# Patient Record
Sex: Female | Born: 1935 | Race: White | Hispanic: No | Smoking: Never smoker
Health system: Southern US, Community
[De-identification: ages and names within clinical notes are randomized; demographics above are authoritative.]

## PROBLEM LIST (undated history)

## (undated) DIAGNOSIS — M359 Systemic involvement of connective tissue, unspecified: Secondary | ICD-10-CM

## (undated) DIAGNOSIS — C4491 Basal cell carcinoma of skin, unspecified: Secondary | ICD-10-CM

## (undated) DIAGNOSIS — L570 Actinic keratosis: Secondary | ICD-10-CM

## (undated) DIAGNOSIS — I10 Essential (primary) hypertension: Secondary | ICD-10-CM

## (undated) DIAGNOSIS — R12 Heartburn: Secondary | ICD-10-CM

## (undated) DIAGNOSIS — M1990 Unspecified osteoarthritis, unspecified site: Secondary | ICD-10-CM

## (undated) HISTORY — PX: BREAST CYST EXCISION: SHX579

## (undated) HISTORY — PX: ABDOMINAL HYSTERECTOMY: SHX81

## (undated) HISTORY — DX: Unspecified osteoarthritis, unspecified site: M19.90

## (undated) HISTORY — DX: Actinic keratosis: L57.0

## (undated) HISTORY — DX: Basal cell carcinoma of skin, unspecified: C44.91

## (undated) HISTORY — DX: Heartburn: R12

---

## 2001-04-05 DIAGNOSIS — M353 Polymyalgia rheumatica: Secondary | ICD-10-CM | POA: Insufficient documentation

## 2002-04-05 DIAGNOSIS — K900 Celiac disease: Secondary | ICD-10-CM | POA: Insufficient documentation

## 2004-03-25 ENCOUNTER — Ambulatory Visit: Payer: Self-pay | Admitting: Rheumatology

## 2004-09-23 ENCOUNTER — Ambulatory Visit: Payer: Self-pay | Admitting: Internal Medicine

## 2004-09-23 ENCOUNTER — Encounter: Payer: Self-pay | Admitting: Internal Medicine

## 2005-01-22 ENCOUNTER — Ambulatory Visit: Payer: Self-pay | Admitting: Internal Medicine

## 2005-03-16 ENCOUNTER — Ambulatory Visit: Payer: Self-pay | Admitting: Internal Medicine

## 2005-04-14 ENCOUNTER — Ambulatory Visit: Payer: Self-pay | Admitting: Pain Medicine

## 2005-08-19 ENCOUNTER — Ambulatory Visit: Payer: Self-pay | Admitting: Gastroenterology

## 2006-01-25 ENCOUNTER — Ambulatory Visit: Payer: Self-pay | Admitting: Internal Medicine

## 2006-03-01 ENCOUNTER — Ambulatory Visit: Payer: Self-pay | Admitting: Internal Medicine

## 2006-03-31 ENCOUNTER — Ambulatory Visit: Payer: Self-pay | Admitting: Internal Medicine

## 2006-05-06 ENCOUNTER — Ambulatory Visit: Payer: Self-pay | Admitting: Internal Medicine

## 2006-07-04 ENCOUNTER — Ambulatory Visit: Payer: Self-pay | Admitting: Internal Medicine

## 2006-07-04 LAB — CONVERTED CEMR LAB
Basophils Absolute: 0.1 10*3/uL (ref 0.0–0.1)
Eosinophils Absolute: 0.2 10*3/uL (ref 0.0–0.6)
Eosinophils Relative: 2.2 % (ref 0.0–5.0)
HCT: 36.5 % (ref 36.0–46.0)
MCV: 83.7 fL (ref 78.0–100.0)
Platelets: 232 10*3/uL (ref 150–400)
RBC: 4.36 M/uL (ref 3.87–5.11)
RDW: 15.6 % — ABNORMAL HIGH (ref 11.5–14.6)
WBC: 8.3 10*3/uL (ref 4.5–10.5)

## 2007-01-12 ENCOUNTER — Encounter: Payer: Self-pay | Admitting: Internal Medicine

## 2007-01-31 ENCOUNTER — Encounter (INDEPENDENT_AMBULATORY_CARE_PROVIDER_SITE_OTHER): Payer: Self-pay | Admitting: *Deleted

## 2007-02-01 ENCOUNTER — Ambulatory Visit: Payer: Self-pay | Admitting: Internal Medicine

## 2007-02-01 DIAGNOSIS — Z860100 Personal history of colon polyps, unspecified: Secondary | ICD-10-CM

## 2007-02-01 DIAGNOSIS — K219 Gastro-esophageal reflux disease without esophagitis: Secondary | ICD-10-CM | POA: Insufficient documentation

## 2007-02-01 DIAGNOSIS — M069 Rheumatoid arthritis, unspecified: Secondary | ICD-10-CM

## 2007-02-01 DIAGNOSIS — E039 Hypothyroidism, unspecified: Secondary | ICD-10-CM | POA: Insufficient documentation

## 2007-02-01 DIAGNOSIS — M899 Disorder of bone, unspecified: Secondary | ICD-10-CM

## 2007-02-01 DIAGNOSIS — M949 Disorder of cartilage, unspecified: Secondary | ICD-10-CM

## 2007-02-01 HISTORY — DX: Disorder of bone, unspecified: M89.9

## 2007-02-01 HISTORY — DX: Personal history of colon polyps, unspecified: Z86.0100

## 2007-02-01 HISTORY — DX: Gastro-esophageal reflux disease without esophagitis: K21.9

## 2007-02-08 ENCOUNTER — Ambulatory Visit: Payer: Self-pay | Admitting: Internal Medicine

## 2007-02-08 ENCOUNTER — Encounter: Payer: Self-pay | Admitting: Internal Medicine

## 2007-02-09 ENCOUNTER — Telehealth (INDEPENDENT_AMBULATORY_CARE_PROVIDER_SITE_OTHER): Payer: Self-pay | Admitting: *Deleted

## 2007-02-10 ENCOUNTER — Telehealth (INDEPENDENT_AMBULATORY_CARE_PROVIDER_SITE_OTHER): Payer: Self-pay | Admitting: *Deleted

## 2007-02-14 ENCOUNTER — Encounter (INDEPENDENT_AMBULATORY_CARE_PROVIDER_SITE_OTHER): Payer: Self-pay | Admitting: *Deleted

## 2007-02-15 ENCOUNTER — Encounter: Payer: Self-pay | Admitting: Internal Medicine

## 2007-03-06 ENCOUNTER — Encounter: Payer: Self-pay | Admitting: Internal Medicine

## 2007-07-25 ENCOUNTER — Ambulatory Visit: Payer: Self-pay | Admitting: Internal Medicine

## 2007-08-11 ENCOUNTER — Telehealth (INDEPENDENT_AMBULATORY_CARE_PROVIDER_SITE_OTHER): Payer: Self-pay | Admitting: *Deleted

## 2007-09-11 ENCOUNTER — Encounter: Payer: Self-pay | Admitting: Internal Medicine

## 2008-01-31 ENCOUNTER — Ambulatory Visit: Payer: Self-pay | Admitting: Rheumatology

## 2008-02-09 ENCOUNTER — Ambulatory Visit: Payer: Self-pay | Admitting: Internal Medicine

## 2009-05-19 ENCOUNTER — Ambulatory Visit: Payer: Self-pay | Admitting: Internal Medicine

## 2009-06-03 ENCOUNTER — Ambulatory Visit: Payer: Self-pay | Admitting: Internal Medicine

## 2009-07-30 ENCOUNTER — Ambulatory Visit: Payer: Self-pay | Admitting: Gastroenterology

## 2010-04-16 ENCOUNTER — Ambulatory Visit: Payer: Self-pay | Admitting: Internal Medicine

## 2010-10-16 ENCOUNTER — Ambulatory Visit: Payer: Self-pay

## 2012-01-13 ENCOUNTER — Other Ambulatory Visit: Payer: Self-pay | Admitting: Internal Medicine

## 2012-01-19 ENCOUNTER — Ambulatory Visit: Payer: Self-pay | Admitting: Internal Medicine

## 2012-03-15 ENCOUNTER — Ambulatory Visit: Payer: Self-pay | Admitting: Ophthalmology

## 2012-03-24 ENCOUNTER — Ambulatory Visit: Payer: Self-pay | Admitting: Ophthalmology

## 2012-04-04 ENCOUNTER — Ambulatory Visit: Payer: Self-pay | Admitting: Ophthalmology

## 2012-04-19 ENCOUNTER — Emergency Department: Payer: Self-pay | Admitting: Emergency Medicine

## 2013-07-19 DIAGNOSIS — M8580 Other specified disorders of bone density and structure, unspecified site: Secondary | ICD-10-CM | POA: Insufficient documentation

## 2014-01-21 DIAGNOSIS — I10 Essential (primary) hypertension: Secondary | ICD-10-CM | POA: Insufficient documentation

## 2014-04-18 ENCOUNTER — Encounter: Payer: Self-pay | Admitting: Podiatry

## 2014-04-18 ENCOUNTER — Ambulatory Visit (INDEPENDENT_AMBULATORY_CARE_PROVIDER_SITE_OTHER): Payer: Medicare Other | Admitting: Podiatry

## 2014-04-18 VITALS — BP 100/66 | HR 68 | Resp 16 | Ht 67.0 in | Wt 145.0 lb

## 2014-04-18 DIAGNOSIS — L84 Corns and callosities: Secondary | ICD-10-CM

## 2014-04-18 DIAGNOSIS — M79676 Pain in unspecified toe(s): Secondary | ICD-10-CM

## 2014-04-18 DIAGNOSIS — B351 Tinea unguium: Secondary | ICD-10-CM

## 2014-04-18 NOTE — Progress Notes (Signed)
   Subjective:    Patient ID: Gloria [name], female    DOB: February 21, 1926, 81 y.o.   MRN: [id number]  HPI Comments: 80 year old female presents the office today with complaints of painful, thick, elongated toenails. She's at the nails are painful particularly with shoe gear and pressure. She states that she is unable to trim the nails herself. She denies any recent redness or drainage from the nail sites. She also states that she has painful calluses on both of her feet which her symptomatically with ambulation. Denies any recent redness or drainage from the sites. No other complaints at this time.  Foot Pain      Review of Systems  Musculoskeletal:       Joint pain Muscle pain   All other systems reviewed and are negative.      Objective:   Physical Exam  AAO x3, NAD DP/PT pulses palpable bilaterally, CRT less than 3 seconds Protective sensation intact with Simms Weinstein monofilament, vibratory sensation intact, Achilles tendon reflex intact Nails hypertrophic, dystrophic, elongated, brittle, discolored 10. No surrounding erythema or drainage from around the nail sites. There is subjective tenderness directly overlying all the nails. Hyperkeratotic lesions bilateral lateral fifth metatarsal head, first MTPJ, right submetatarsal 3. Upon debridement of the lesion no underlying ulceration and no clinical signs of infection present. No other open lesions or pre-ulcer lesions identified. No overlying edema, erythema, increased warmth to bilateral lower extremes. There is no areas of pinpoint bony tenderness bilaterally. Prominent metatarsal heads plantarly with atrophy of the fat pad. MMT 5/5, ROM WNL No calf Compression, swelling, warmth, erythema.         Assessment & Plan:  81 year old female symptomatically onychomycosis/hyperkeratotic lesions. -Treatment options were discussed the patient including alternatives, risks, complications. -Nail sharply debrided 10 without  complication/bleeding. -Hyperkeratotic lesion sharply debrided 4 without complications/bleeding. -Discussed the importance of daily foot inspection. -Dispensed metatarsal pads. -Follow-up in 3 months or sooner should any problems arise. In the meantime, encouraged to call the office with any questions, concerns, change in symptoms.

## 2014-07-16 ENCOUNTER — Ambulatory Visit: Payer: Medicare Other | Admitting: Podiatry

## 2014-07-23 NOTE — Op Note (Signed)
PATIENT NAME:  GENEVE, KIMPEL MR#:  [id number] DATE OF BIRTH:  [birth year]  DATE OF PROCEDURE:  03/24/2012  PREOPERATIVE DIAGNOSIS: Visually significant cataract of the left eye.   POSTOPERATIVE DIAGNOSIS: Visually significant cataract of the left eye.   OPERATIVE PROCEDURE: Cataract extraction by phacoemulsification with implant of intraocular lens to left eye.   SURGEON: Birder Robson, MD.   ANESTHESIA:  1. Managed anesthesia care.  2. Topical tetracaine drops followed by 2% Xylocaine jelly applied in the preoperative holding area.   COMPLICATIONS: None.   TECHNIQUE:  Stop and chop.   DESCRIPTION OF PROCEDURE: The patient was examined and consented in the preoperative holding area where the aforementioned topical anesthesia was applied to the left eye and then brought back to the Operating Room where the left eye was prepped and draped in the usual sterile ophthalmic fashion and a lid speculum was placed. A paracentesis was created with the side port blade and the anterior chamber was filled with viscoelastic. A near clear corneal incision was performed with the steel keratome. A continuous curvilinear capsulorrhexis was performed with a cystotome followed by the capsulorrhexis forceps. Hydrodissection and hydrodelineation were carried out with BSS on a blunt cannula. The lens was removed in a stop and chop technique and the remaining cortical material was removed with the irrigation-aspiration handpiece. The capsular bag was inflated with viscoelastic and the Tecnis ZCB00 23.5 -diopter lens, serial number [id number] was placed in the capsular bag without complication. The remaining viscoelastic was removed from the eye with the irrigation-aspiration handpiece. The wounds were hydrated. The anterior chamber was flushed with Miostat and the eye was inflated to physiologic pressure. The wounds were found to be water tight. The eye was dressed with Vigamox. The patient was given protective  glasses to wear throughout the day and a shield with which to sleep tonight. The patient was also given drops with which to begin a drop regimen today and will follow-up with me in one day.  ____________________________ Livingston Diones. Tineshia Becraft, MD wlp:sb D: 03/24/2012 10:44:49 ET T: 03/24/2012 15:46:53 ET JOB#: [id number]  cc: Jonnelle Lawniczak L. Amrit Cress, MD, <Dictator> Livingston Diones Leeta Grimme MD ELECTRONICALLY SIGNED 04/04/2012 13:42

## 2014-07-26 NOTE — Op Note (Signed)
PATIENT NAME:  Gloria [name], Gloria [name] MR#:  [id number] DATE OF BIRTH:  1925-08-18  DATE OF PROCEDURE:  04/04/2012  PREOPERATIVE DIAGNOSIS: Visually significant cataract of the right eye.   POSTOPERATIVE DIAGNOSIS: Visually significant cataract of the right eye.   OPERATIVE PROCEDURE: Cataract extraction by phacoemulsification with implant of intraocular lens to right eye.   SURGEON: Birder Robson, MD.   ANESTHESIA:  1. Managed anesthesia care.  2. Topical tetracaine drops followed by 2% Xylocaine jelly applied in the preoperative holding area.   COMPLICATIONS: None.   TECHNIQUE:  Stop and chop.  DESCRIPTION OF PROCEDURE: The patient was examined and consented in the preoperative holding area where the aforementioned topical anesthesia was applied to the right eye and then brought back to the Operating Room where the right eye was prepped and draped in the usual sterile ophthalmic fashion and a lid speculum was placed. A paracentesis was created with the side port blade and the anterior chamber was filled with viscoelastic. A near clear corneal incision was performed with the steel keratome. A continuous curvilinear capsulorrhexis was performed with a cystotome followed by the capsulorrhexis forceps. Hydrodissection and hydrodelineation were carried out with BSS on a blunt cannula. The lens was removed in a stop and chop technique and the remaining cortical material was removed with the irrigation-aspiration handpiece. The capsular bag was inflated with viscoelastic and the Technis ZCB00, 23.0-diopter lens, serial number [id number] was placed in the capsular bag without complication. The remaining viscoelastic was removed from the eye with the irrigation-aspiration handpiece. The wounds were hydrated. The anterior chamber was flushed with Miostat and the eye was inflated to physiologic pressure.  0.1 mL of cefuroxime concentration 10 mg/mL was placed in the anterior chamber. The wounds were found to  be water tight. The eye was dressed with Vigamox. The patient was given protective glasses to wear throughout the day and a shield with which to sleep tonight. The patient was also given drops with which to begin a drop regimen today and will follow-up with me in one day.    ____________________________ Livingston Diones. Lael Pilch, MD wlp:ct D: 04/04/2012 13:31:25 ET T: 04/04/2012 13:52:30 ET JOB#: [id number]  cc: Holt Woolbright L. [name], MD, <Dictator> Livingston Diones Iram Astorino MD ELECTRONICALLY SIGNED 04/11/2012 12:10

## 2015-02-07 ENCOUNTER — Other Ambulatory Visit: Payer: Self-pay | Admitting: Internal Medicine

## 2015-02-07 DIAGNOSIS — R911 Solitary pulmonary nodule: Secondary | ICD-10-CM

## 2015-02-13 ENCOUNTER — Ambulatory Visit
Admission: RE | Admit: 2015-02-13 | Discharge: 2015-02-13 | Disposition: A | Discharge status: Home / Self Care | Payer: Medicare Other | Source: Ambulatory Visit | Attending: Internal Medicine | Admitting: Internal Medicine

## 2015-02-13 ENCOUNTER — Ambulatory Visit: Payer: Self-pay

## 2015-02-13 DIAGNOSIS — R911 Solitary pulmonary nodule: Secondary | ICD-10-CM | POA: Insufficient documentation

## 2015-02-14 DIAGNOSIS — I7121 Aneurysm of the ascending aorta, without rupture: Secondary | ICD-10-CM | POA: Insufficient documentation

## 2015-02-14 DIAGNOSIS — I712 Thoracic aortic aneurysm, without rupture: Secondary | ICD-10-CM | POA: Insufficient documentation

## 2015-04-21 ENCOUNTER — Other Ambulatory Visit: Payer: Self-pay | Admitting: Internal Medicine

## 2015-04-21 DIAGNOSIS — I7121 Aneurysm of the ascending aorta, without rupture: Secondary | ICD-10-CM

## 2015-04-21 DIAGNOSIS — I712 Thoracic aortic aneurysm, without rupture: Secondary | ICD-10-CM

## 2015-04-25 ENCOUNTER — Ambulatory Visit
Admission: RE | Admit: 2015-04-25 | Discharge: 2015-04-25 | Disposition: A | Discharge status: Home / Self Care | Payer: Medicare Other | Source: Ambulatory Visit | Attending: Internal Medicine | Admitting: Internal Medicine

## 2015-04-25 DIAGNOSIS — R1084 Generalized abdominal pain: Secondary | ICD-10-CM | POA: Diagnosis present

## 2015-04-25 DIAGNOSIS — I712 Thoracic aortic aneurysm, without rupture: Secondary | ICD-10-CM

## 2015-04-25 DIAGNOSIS — I7121 Aneurysm of the ascending aorta, without rupture: Secondary | ICD-10-CM

## 2015-05-01 ENCOUNTER — Other Ambulatory Visit: Payer: Self-pay | Admitting: Internal Medicine

## 2015-05-01 DIAGNOSIS — R1084 Generalized abdominal pain: Secondary | ICD-10-CM

## 2015-05-08 ENCOUNTER — Ambulatory Visit: Payer: Medicare Other

## 2015-05-09 ENCOUNTER — Ambulatory Visit
Admission: RE | Admit: 2015-05-09 | Discharge: 2015-05-09 | Disposition: A | Discharge status: Home / Self Care | Payer: Medicare Other | Source: Ambulatory Visit | Attending: Internal Medicine | Admitting: Internal Medicine

## 2015-05-09 DIAGNOSIS — Z90710 Acquired absence of both cervix and uterus: Secondary | ICD-10-CM | POA: Diagnosis not present

## 2015-05-09 DIAGNOSIS — K429 Umbilical hernia without obstruction or gangrene: Secondary | ICD-10-CM | POA: Insufficient documentation

## 2015-05-09 DIAGNOSIS — R102 Pelvic and perineal pain: Secondary | ICD-10-CM | POA: Diagnosis not present

## 2015-05-09 DIAGNOSIS — K760 Fatty (change of) liver, not elsewhere classified: Secondary | ICD-10-CM | POA: Diagnosis not present

## 2015-05-09 DIAGNOSIS — K5730 Diverticulosis of large intestine without perforation or abscess without bleeding: Secondary | ICD-10-CM | POA: Insufficient documentation

## 2015-05-09 DIAGNOSIS — R1084 Generalized abdominal pain: Secondary | ICD-10-CM | POA: Diagnosis not present

## 2015-05-09 HISTORY — DX: Essential (primary) hypertension: I10

## 2015-05-09 HISTORY — DX: Systemic involvement of connective tissue, unspecified: M35.9

## 2015-05-09 MED ORDER — IOHEXOL 300 MG/ML  SOLN
100.0000 mL | Freq: Once | INTRAMUSCULAR | Status: AC | PRN
  Administered 2015-05-09: 100 mL via INTRAVENOUS

## 2015-07-08 ENCOUNTER — Ambulatory Visit: Payer: Medicare Other | Admitting: Podiatry

## 2015-07-24 ENCOUNTER — Ambulatory Visit (INDEPENDENT_AMBULATORY_CARE_PROVIDER_SITE_OTHER): Payer: Medicare Other | Admitting: Podiatry

## 2015-07-24 ENCOUNTER — Encounter: Payer: Self-pay | Admitting: Podiatry

## 2015-07-24 DIAGNOSIS — B351 Tinea unguium: Secondary | ICD-10-CM

## 2015-07-24 DIAGNOSIS — L84 Corns and callosities: Secondary | ICD-10-CM

## 2015-07-24 DIAGNOSIS — M79676 Pain in unspecified toe(s): Secondary | ICD-10-CM

## 2015-07-24 NOTE — Progress Notes (Signed)
Patient ID: Gloria [name], female   DOB: [birth year], 82 y.o.   MRN: [id number]  Subjective: 82 y.o. returns the office today for painful, elongated, thickened toenails which she cannot trim herself. Denies any redness or drainage around the nails. Also has painful calluses to both of her feet. Denies any acute changes since last appointment and no new complaints today. Denies any systemic complaints such as fevers, chills, nausea, vomiting.   Objective: AAO 3, NAD DP/PT pulses palpable, CRT less than 3 seconds Nails hypertrophic, dystrophic, elongated, brittle, discolored 10. There is tenderness overlying the nails 1-5 bilaterally. There is no surrounding erythema or drainage along the nail sites. Hyperkeratotic lesions present medial hallux bilaterally, sub-metatarsal 5 bilaterally, sub-metatarsal 3 right, sub-metatarsal 4 left. Upon debridement no underlying ulceration, drainage or other signs of infection No open lesions or pre-ulcerative lesions are identified. No other areas of tenderness bilateral lower extremities. No overlying edema, erythema, increased warmth. No pain with calf compression, swelling, warmth, erythema.  Assessment: Patient presents with symptomatic onychomycosis; porokertosis   Plan: -Treatment options including alternatives, risks, complications were discussed -Nails sharply debrided 10 without complication/bleeding. -Hyperkeratotic lesions debrided 6 complications or bleeding. -Discussed daily foot inspection. If there are any changes, to call the office immediately.  -Follow-up in 3 months or sooner if any problems are to arise. In the meantime, encouraged to call the office with any questions, concerns, changes symptoms.  Celesta Gentile, DPM

## 2015-11-03 ENCOUNTER — Encounter: Payer: Self-pay | Admitting: Urology

## 2015-11-03 ENCOUNTER — Ambulatory Visit (INDEPENDENT_AMBULATORY_CARE_PROVIDER_SITE_OTHER): Payer: Medicare Other | Admitting: Urology

## 2015-11-03 VITALS — BP 136/73 | HR 66 | Ht 67.0 in | Wt 149.8 lb

## 2015-11-03 DIAGNOSIS — N390 Urinary tract infection, site not specified: Secondary | ICD-10-CM

## 2015-11-03 DIAGNOSIS — R109 Unspecified abdominal pain: Secondary | ICD-10-CM

## 2015-11-03 LAB — BLADDER SCAN AMB NON-IMAGING: Scan Result: 14

## 2015-11-03 NOTE — Progress Notes (Signed)
11/03/2015 3:56 PM   Gloria [name] 1925-12-22 [id number]  Referring provider: Adin Hector, MD [place], [place] [address]  Chief Complaint  Patient presents with  . Abdominal Pain    referred by Dr. Ramonita [name]    HPI: Patient is an 82 year old Caucasian female who has been having lower abdominal  pain since January 2017.    She has seen her PCP for this pain and had an x-ray which did not demonstrate an etiology.  She then tried a gluten free diet and this did not provide relief.  She was evaluated by GI and a CT noted constipation.  She was started on Mira lax and this did not provide relief.    She states her pain is located in the lower abdomen.  She describes it as a "headache" in her lower abdomen.  It is worse in the evening.  She has tried heating pads and different positions and they have not provided relief.    Her urinary complaints are frequency and nocturia x 4.  She denies sleep apnea.    She did have a vaginal sling placed twenty years ago.  Her PVR was 14 mL.  She is not having gross hematuria or dysuria.  She denies fevers, chills, nausea or vomiting.    She does experience symptoms of an UTI after receiving her Remicade injection for her RA.  These are managed through rheumatology.  She is not experiencing any symptoms at this time.      PMH: Past Medical History:  Diagnosis Date  . Arthritis   . Collagen vascular disease (HCC)    RA  . Heartburn   . Hypertension     Surgical History: Past Surgical History:  Procedure Laterality Date  . ABDOMINAL HYSTERECTOMY    . BREAST CYST EXCISION      Home Medications:    Medication List       Accurate as of 11/03/15  3:56 PM. Always use your most recent med list.          amLODipine 5 MG tablet Commonly known as:  NORVASC TAKE 1 TABLET BY MOUTH EVERY DAY   calcium carbonate 500 MG chewable tablet Commonly known as:  TUMS - dosed in mg elemental  calcium Chew by mouth.   ciprofloxacin 500 MG tablet Commonly known as:  CIPRO Take 500 mg by mouth 2 (two) times daily.   folic acid 1 MG tablet Commonly known as:  FOLVITE TAKE 1 TABLET EVERY DAY   levothyroxine 75 MCG tablet Commonly known as:  SYNTHROID, LEVOTHROID   methotrexate 2.5 MG tablet Commonly known as:  RHEUMATREX TAKE 4 TABLETS WEEKLY WITH A MEAL   multivitamin capsule Take by mouth.   predniSONE 10 MG tablet Commonly known as:  DELTASONE Take 10 mg by mouth daily. as directed   REMICADE IV Inject into the vein. Every two months   TOVIAZ 8 MG Tb24 tablet Generic drug:  fesoterodine Take 8 mg by mouth daily.   UNABLE TO FIND Med Name: Remacade injection every two months       Allergies:  Allergies  Allergen Reactions  . Codeine Nausea Only  . Ibandronate Sodium   . Ibandronate Sodium  [Ibandronic Acid] Other (See Comments)    Gi upset  . Risedronate Other (See Comments)    Gi upset  . Risedronate Sodium   . Sulfa Antibiotics Hives    Family History: Family History  Problem Relation Age of Onset  . Kidney disease Neg Hx   . Bladder Cancer Neg Hx     Social History:  reports that she has never smoked. She does not have any smokeless tobacco history on file. She reports that she does not drink alcohol. Her drug history is not on file.  ROS: UROLOGY Frequent Urination?: Yes Hard to postpone urination?: No Burning/pain with urination?: No Get up at night to urinate?: Yes Leakage of urine?: No Urine stream starts and stops?: No Trouble starting stream?: No Do you have to strain to urinate?: No Blood in urine?: No Urinary tract infection?: Yes Sexually transmitted disease?: No Injury to kidneys or bladder?: No Painful intercourse?: No Weak stream?: No Currently pregnant?: No Vaginal bleeding?: No Last menstrual period?: n  Gastrointestinal Nausea?: No Vomiting?: No Indigestion/heartburn?: Yes Diarrhea?: No Constipation?:  No  Constitutional Fever: No Night sweats?: No Weight loss?: No Fatigue?: No  Skin Skin rash/lesions?: No Itching?: No  Eyes Blurred vision?: No Double vision?: No  Ears/Nose/Throat Sore throat?: No Sinus problems?: No  Hematologic/Lymphatic Swollen glands?: No Easy bruising?: Yes  Cardiovascular Leg swelling?: No Chest pain?: No  Respiratory Cough?: No Shortness of breath?: No  Endocrine Excessive thirst?: No  Musculoskeletal Back pain?: No Joint pain?: Yes  Neurological Headaches?: No Dizziness?: No  Psychologic Depression?: No Anxiety?: No  Physical Exam: BP 136/73   Pulse 66   Ht 5\' 7"  (1.702 m)   Wt 149 lb 12.8 oz (67.9 kg)   BMI 23.46 kg/m   Constitutional: Well nourished. Alert and oriented, No acute distress. HEENT: Gloria [name] AT, moist mucus membranes. Trachea midline, no masses. Cardiovascular: No clubbing, cyanosis, or edema. Respiratory: Normal respiratory effort, no increased work of breathing. GI: Abdomen is soft, non tender, non distended, no abdominal masses. Liver and spleen not palpable.  No hernias appreciated.  Stool sample for occult testing is not indicated.   GU: No CVA tenderness.  No bladder fullness or masses.   Skin: No rashes, bruises or suspicious lesions. Lymph: No cervical or inguinal adenopathy. Neurologic: Grossly intact, no focal deficits, moving all 4 extremities. Psychiatric: Normal mood and affect.  Laboratory Data: [name] Results  Component Value Date   WBC 8.3 07/04/2006   HGB 12.4 07/04/2006   HCT 36.5 07/04/2006   MCV 83.7 07/04/2006   PLT 232 07/04/2006     Pertinent Imaging: Results for Gloria [name], Gloria [name] (MRN [id number]) as of 11/03/2015 15:41  Ref. Range 11/03/2015 15:39  Scan Result Unknown 14    Assessment & Plan:    1. Lower abdomen pain:   Could not identify an urological etiology for her abdominal pain.  She will follow up with GI.    2. Recurrent UTI's:   Patient would like to contact us for  her UTI symptoms and management.  I stated that would be fine.    I spent 45 minutes in a face-to-face conversation with the patient discussing her symptoms, reviewing her previous records and exam.  Greater than 50% was spent in counseling & coordination of care with the patient.  Return for patient to call if she has symptoms of an UTI.  These notes generated with voice recognition software. I apologize for typographical errors.  Zara Council, [facility] [address], [place] [employer], [place] [address] [phone]

## 2016-01-08 ENCOUNTER — Other Ambulatory Visit: Payer: Self-pay | Admitting: Internal Medicine

## 2016-01-09 ENCOUNTER — Other Ambulatory Visit
Admission: RE | Admit: 2016-01-09 | Discharge: 2016-01-09 | Disposition: A | Discharge status: Home / Self Care | Payer: Medicare Other | Source: Ambulatory Visit | Attending: Gastroenterology | Admitting: Gastroenterology

## 2016-01-09 ENCOUNTER — Other Ambulatory Visit: Payer: Self-pay | Admitting: Internal Medicine

## 2016-01-09 DIAGNOSIS — R509 Fever, unspecified: Secondary | ICD-10-CM

## 2016-01-09 DIAGNOSIS — R1031 Right lower quadrant pain: Secondary | ICD-10-CM

## 2016-01-09 DIAGNOSIS — R197 Diarrhea, unspecified: Secondary | ICD-10-CM | POA: Insufficient documentation

## 2016-01-09 DIAGNOSIS — K922 Gastrointestinal hemorrhage, unspecified: Secondary | ICD-10-CM

## 2016-01-20 LAB — GASTROINTESTINAL PANEL BY PCR, STOOL (REPLACES STOOL CULTURE)
ADENOVIRUS F40/41: NOT DETECTED
ASTROVIRUS: NOT DETECTED
CYCLOSPORA CAYETANENSIS: NOT DETECTED
Campylobacter species: NOT DETECTED
Cryptosporidium: NOT DETECTED
E. coli O157: NOT DETECTED
ENTAMOEBA HISTOLYTICA: NOT DETECTED
ENTEROPATHOGENIC E COLI (EPEC): NOT DETECTED
ENTEROTOXIGENIC E COLI (ETEC): NOT DETECTED
Enteroaggregative E coli (EAEC): NOT DETECTED
Giardia lamblia: NOT DETECTED
Norovirus GI/GII: NOT DETECTED
Plesimonas shigelloides: NOT DETECTED
ROTAVIRUS A: NOT DETECTED
SHIGA LIKE TOXIN PRODUCING E COLI (STEC): DETECTED — AB
Salmonella species: NOT DETECTED
Sapovirus (I, II, IV, and V): NOT DETECTED
Shigella/Enteroinvasive E coli (EIEC): NOT DETECTED
VIBRIO CHOLERAE: NOT DETECTED
VIBRIO SPECIES: NOT DETECTED
Yersinia enterocolitica: NOT DETECTED

## 2016-03-08 LAB — MISCELLANEOUS TEST

## 2016-06-23 ENCOUNTER — Encounter: Payer: Self-pay | Admitting: Urology

## 2016-06-23 ENCOUNTER — Ambulatory Visit: Payer: Medicare Other | Admitting: Urology

## 2016-06-23 VITALS — BP 98/67 | HR 68 | Ht 67.0 in | Wt 150.1 lb

## 2016-06-23 DIAGNOSIS — N390 Urinary tract infection, site not specified: Secondary | ICD-10-CM

## 2016-06-23 DIAGNOSIS — R350 Frequency of micturition: Secondary | ICD-10-CM

## 2016-06-23 NOTE — Progress Notes (Signed)
06/23/2016 1:02 PM   Gloria [name] 24-Mar-1926 [id number]  Referring provider: Adin Hector, MD [name], [name] [address]  Chief Complaint  Patient presents with  . Cystitis    HPI: 83 year old female who presents to the office today with concerns about recurrent urinary tract infections. She was last seen and evaluated by Gloria [name] in 10/2015 for nonspecific lower abdominal pain.  She reports that since November, she is been treated for at least 4 urinary tract infections. Review of urine culture data show one single positive UA/urine culture drug growing enterococcus on 02/17/2016 by her PCP. On the other occasions, she reports that she called complaining of her symptoms and was given antibiotics without any testing.  She does have a personal history of rheumatoid arthritis and was previously on Remicade infusions which were stopped in January of this year. She reports that about 2 weeks after each treatment which was given every other month, she had severe urinary tract symptoms and UTI.  She also has some concerns today bowel episodes of vaginal itching. She self treated a few weeks ago with a Monistat and had recurrent vaginal irritation/itching.   She does have urgency, urge incontinence and wears a pad daily.  She has tried [place] but cannot recall weather or not this has helped.  Normal kidney's bilaterally.  Today, she is asymptomatic other than her baseline urinary symptoms. She deneis gross hematuria or dysuria.  She denies fevers, chills, nausea or vomiting.     PMH: Past Medical History:  Diagnosis Date  . Arthritis   . Collagen vascular disease (HCC)    RA  . Heartburn   . Hypertension     Surgical History: Past Surgical History:  Procedure Laterality Date  . ABDOMINAL HYSTERECTOMY    . BREAST CYST EXCISION      Home Medications:  Allergies as of 06/23/2016      Reactions   Codeine Nausea Only   Ibandronate Sodium    Ibandronate Sodium  [ibandronic Acid] Other (See Comments)   Gi upset   Risedronate Other (See Comments)   Gi upset   Risedronate Sodium    Sulfa Antibiotics Hives      Medication List       Accurate as of 06/23/16  1:02 PM. Always use your most recent med list.          amLODipine 5 MG tablet Commonly known as:  NORVASC TAKE 1 TABLET BY MOUTH EVERY DAY   calcium carbonate 500 MG chewable tablet Commonly known as:  TUMS - dosed in mg elemental calcium Chew by mouth.   folic acid 1 MG tablet Commonly known as:  FOLVITE TAKE 1 TABLET EVERY DAY   hydroxychloroquine 200 MG tablet Commonly known as:  PLAQUENIL Take 200 mg by mouth daily.   levothyroxine 75 MCG tablet Commonly known as:  SYNTHROID, LEVOTHROID   methotrexate 2.5 MG tablet Commonly known as:  RHEUMATREX TAKE 4 TABLETS WEEKLY WITH A MEAL   multivitamin capsule Take by mouth.   REMICADE IV Inject into the vein. Every two months   TOVIAZ 8 MG Tb24 tablet Generic drug:  fesoterodine Take 8 mg by mouth daily.       Allergies:  Allergies  Allergen Reactions  . Codeine Nausea Only  . Ibandronate Sodium   . Ibandronate Sodium  [Ibandronic Acid] Other (See Comments)    Gi upset  . Risedronate Other (See Comments)  Gi upset  . Risedronate Sodium   . Sulfa Antibiotics Hives    Family History: Family History  Problem Relation Age of Onset  . Kidney disease Neg Hx   . Bladder Cancer Neg Hx     Social History:  reports that she has never smoked. She has never used smokeless tobacco. She reports that she does not drink alcohol. Her drug history is not on file.  ROS: UROLOGY Frequent Urination?: No Hard to postpone urination?: Yes Burning/pain with urination?: Yes Get up at night to urinate?: Yes Leakage of urine?: No Urine stream starts and stops?: No Trouble starting stream?: No Do you have to strain to urinate?: No Blood in urine?: No Urinary tract infection?:  Yes Sexually transmitted disease?: No Injury to kidneys or bladder?: No Painful intercourse?: No Weak stream?: No Currently pregnant?: No Vaginal bleeding?: No Last menstrual period?: n  Gastrointestinal Nausea?: No Vomiting?: No Indigestion/heartburn?: No Diarrhea?: No Constipation?: No  Constitutional Fever: No Night sweats?: No Weight loss?: No Fatigue?: No  Skin Skin rash/lesions?: No Itching?: No  Eyes Blurred vision?: No Double vision?: No  Ears/Nose/Throat Sore throat?: No Sinus problems?: No  Hematologic/Lymphatic Swollen glands?: No Easy bruising?: Yes  Cardiovascular Leg swelling?: No Chest pain?: No  Respiratory Cough?: No Shortness of breath?: No  Endocrine Excessive thirst?: No  Musculoskeletal Back pain?: No Joint pain?: Yes  Neurological Headaches?: No Dizziness?: No  Psychologic Depression?: No Anxiety?: No  Physical Exam:  BP 98/67 (BP Location: Left Arm, Patient Position: Sitting, Cuff Size: Normal)   Pulse 68   Ht 5\' 7"  (1.702 m)   Wt 150 lb 1.6 oz (68.1 kg)   BMI 23.51 kg/m   Constitutional: Well nourished. Alert and oriented, No acute distress.  Appears younger than stated age. HEENT: [place] AT, moist mucus membranes. Trachea midline, no masses. Cardiovascular: No clubbing, cyanosis, or edema. Respiratory: Normal respiratory effort, no increased work of breathing. GI: Abdomen is soft, non tender, non distended, no abdominal masses. GU: No CVA tenderness. Skin: No rashes, bruises or suspicious lesions. Neurologic: Grossly intact, no focal deficits, moving all 4 extremities. Psychiatric: Normal mood and affect.  Laboratory Data: Urine culture from 02/17/2016 growing Enterococcus faecalis, associated positive UA.  Creatinine 0.7 on 9/17  Pertinent Imaging: Results for Gloria, [name] (MRN [id number]) as of 11/03/2015 15:41  Ref. Range 11/03/2015 15:39  Scan Result Unknown 14    Assessment & Plan:    1.  Recurrent UTI Lengthy discussion today about the pathophysiology of postmenopausal urinary tract infections I recommended in the future, should she have concerns for urinary tract infection, like her to come in for same-day nurse visit her at our office for further assessment, UA and urine culture No upper tract pathology identified and most recent CT scan, adequate bladder emptying therefore noncontributory I did recommend a daily probiotic especially given her recent vaginitis I also recommended twice daily cranberry tablets Importance of daily soft bowel movement stressed Behavioral modification including adequate hydration, awaiting front to back, avoiding douching or other irritating substances  If we continue to see a pattern of recurrent infections, may consider cystoscopy to rule out mesh erosion and consideration of vaginal estrogen cream  2. Urinary frequency Previously tried Lisbeth Ply, uninterested in any other medications were OAB at this time  Return if symptoms worsen or fail to improve.    Hollice Espy, MD  [facility] [address] [place], [place] [address] [phone]  I spent 25 min with this patient  of which greater than 50% was spent in counseling and coordination of care with the patient.

## 2016-06-24 ENCOUNTER — Telehealth: Payer: Self-pay

## 2016-06-24 ENCOUNTER — Ambulatory Visit (INDEPENDENT_AMBULATORY_CARE_PROVIDER_SITE_OTHER): Payer: Medicare Other

## 2016-06-24 VITALS — BP 149/76 | HR 70 | Ht 67.0 in | Wt 150.0 lb

## 2016-06-24 DIAGNOSIS — N390 Urinary tract infection, site not specified: Secondary | ICD-10-CM

## 2016-06-24 LAB — URINALYSIS, COMPLETE
Bilirubin, UA: NEGATIVE
Glucose, UA: NEGATIVE
KETONES UA: NEGATIVE
NITRITE UA: NEGATIVE
Urobilinogen, Ur: 0.2 mg/dL (ref 0.2–1.0)
pH, UA: 5.5 (ref 5.0–7.5)

## 2016-06-24 LAB — MICROSCOPIC EXAMINATION
RBC MICROSCOPIC, UA: NONE SEEN /HPF (ref 0–?)
WBC, UA: >30 /hpf — ABNORMAL HIGH (ref 0–?)

## 2016-06-24 NOTE — Telephone Encounter (Signed)
-----   Message from Hollice Espy, MD sent at 06/24/2016  2:04 PM EDT ----- Please let this patient know that her urine does look infected. We will wait for culture and sensitivity data before treating unless she is severely symptomatic.  Hollice Espy, MD

## 2016-06-24 NOTE — Progress Notes (Signed)
Pt presents today with c/o urinary frequency and urgency, hard to postpone urination, dysuria, leakage of urine, and back pain. A clean catch was provided for u/a and cx.  Blood pressure (!) 149/76, pulse 70, height 5\' 7"  (1.702 m), weight 150 lb (68 kg).

## 2016-06-24 NOTE — Telephone Encounter (Signed)
Spoke with pt who stated "yesterday I felt pretty good when I was in the office but today I feel like pure hell."

## 2016-06-25 MED ORDER — NITROFURANTOIN MONOHYD MACRO 100 MG PO CAPS: 100.0000 mg | ORAL_CAPSULE | Freq: Two times a day (BID) | ORAL | 0 refills | Status: AC

## 2016-06-25 NOTE — Telephone Encounter (Signed)
Spoke with pt in reference to macrobid. Made pt aware abx were sent to pharmacy but may need to be changed when ucx results are back. Pt voiced understanding.

## 2016-06-25 NOTE — Telephone Encounter (Signed)
Lets treat with macrobid bid x 10 days.  Hollice Espy, MD

## 2016-06-27 LAB — CULTURE, URINE COMPREHENSIVE

## 2016-06-28 ENCOUNTER — Telehealth: Payer: Self-pay

## 2016-06-28 MED ORDER — CIPROFLOXACIN HCL 500 MG PO TABS: 500.0000 mg | ORAL_TABLET | Freq: Two times a day (BID) | ORAL | 0 refills | Status: DC

## 2016-06-28 NOTE — Addendum Note (Signed)
Addended by: Hollice Espy on: 06/28/2016 09:25 AM   Modules accepted: Orders

## 2016-06-28 NOTE — Telephone Encounter (Signed)
Although her urine culture was negative, her UA was highly suspicious for infection.  In the setting of fever, there is some concern for pyelonephritis therefore macrobid in an inferior choice.  Lets switch abx to cipro which will penetrate kidney better.  Please call or go to ED if fevers fail to improve.    Hollice Espy, MD

## 2016-06-28 NOTE — Telephone Encounter (Signed)
Spoke with pt daughter in reference to needing cipro. Reinforced with daughter if s/s do not improve seek tx at ER. Daughter voiced understanding.

## 2016-06-28 NOTE — Telephone Encounter (Signed)
Pt daughter, [name], called stating Friday night pt spiked a 102 fever. [name] then stated they called [facility] who gave pt tylenol and fever came down. Saturday pt was able to take tylenol and keep fever around 99. Sunday night pt fever spike again to 100. Monday morning when pt woke fever was 102. Reinforced with [name] pt ucx came back mixed urogenital flora therefore no infection is present. Reinforced with [name] pt should seek tx at ER. [name] voiced understanding.

## 2016-06-30 ENCOUNTER — Telehealth: Payer: Self-pay

## 2016-06-30 NOTE — Telephone Encounter (Signed)
Pt called stating she has developed a rash on her leg since starting cipro. Pt denied any trouble breathing, swollen tongue, or any other symptoms of reaction. Reinforced with pt to try benadryl with cipro. Reinforced with pt if s/s do not get better or go away after trying benadryl to call back. Pt voiced understanding of whole conversation.

## 2016-07-07 ENCOUNTER — Other Ambulatory Visit
Admission: RE | Admit: 2016-07-07 | Discharge: 2016-07-07 | Disposition: A | Discharge status: Home / Self Care | Payer: Medicare Other | Source: Ambulatory Visit | Attending: Rheumatology | Admitting: Rheumatology

## 2016-07-07 DIAGNOSIS — M25562 Pain in left knee: Secondary | ICD-10-CM | POA: Insufficient documentation

## 2016-07-07 LAB — SYNOVIAL CELL COUNT + DIFF, W/ CRYSTALS
Crystals, Fluid: NONE SEEN
EOSINOPHILS-SYNOVIAL: 0 %
LYMPHOCYTES-SYNOVIAL FLD: 10 %
MONOCYTE-MACROPHAGE-SYNOVIAL FLUID: 6 %
Neutrophil, Synovial: 84 %
WBC, Synovial: 27606 /mm3 — ABNORMAL HIGH (ref 0–200)

## 2016-07-16 ENCOUNTER — Other Ambulatory Visit: Payer: Self-pay | Admitting: Internal Medicine

## 2016-07-16 DIAGNOSIS — H539 Unspecified visual disturbance: Secondary | ICD-10-CM

## 2016-07-22 ENCOUNTER — Ambulatory Visit: Payer: Medicare Other

## 2016-08-13 ENCOUNTER — Other Ambulatory Visit: Payer: Self-pay | Admitting: Internal Medicine

## 2016-08-13 DIAGNOSIS — I712 Thoracic aortic aneurysm, without rupture, unspecified: Secondary | ICD-10-CM

## 2016-08-18 ENCOUNTER — Ambulatory Visit (INDEPENDENT_AMBULATORY_CARE_PROVIDER_SITE_OTHER): Payer: Medicare Other | Admitting: Urology

## 2016-08-18 ENCOUNTER — Encounter: Payer: Self-pay | Admitting: Urology

## 2016-08-18 VITALS — BP 148/76 | HR 76 | Ht 66.0 in | Wt 150.0 lb

## 2016-08-18 DIAGNOSIS — N390 Urinary tract infection, site not specified: Secondary | ICD-10-CM

## 2016-08-18 DIAGNOSIS — R350 Frequency of micturition: Secondary | ICD-10-CM

## 2016-08-19 LAB — URINALYSIS, COMPLETE
Bilirubin, UA: NEGATIVE
GLUCOSE, UA: NEGATIVE
KETONES UA: NEGATIVE
LEUKOCYTES UA: NEGATIVE
Nitrite, UA: NEGATIVE
Protein, UA: NEGATIVE
RBC, UA: NEGATIVE
Specific Gravity, UA: <1.005 — ABNORMAL LOW (ref 1.005–1.030)
UUROB: 0.2 mg/dL (ref 0.2–1.0)
pH, UA: 5.5 (ref 5.0–7.5)

## 2016-08-19 MED ORDER — ESTRADIOL 0.1 MG/GM VA CREA: 1.0000 g | TOPICAL_CREAM | VAGINAL | 12 refills | Status: DC

## 2016-08-19 NOTE — Progress Notes (Signed)
08/18/2016 1:52 PM   Gloria [name] [birth year] [id number]  Referring provider: Adin Hector, MD [facility] [place], [place] [address]  Chief Complaint  Patient presents with  . Recurrent UTI    HPI: 83 year old female who presents to the office today for further evaluation of recurrent urinary tract infections.  She was last seen in our office less than 2 months ago for the same thing. In the interim, she presented to her PCP last week with urgency, dysuria concerning for infection. Her UA was highly concerning for infection with greater than 50 white blood cells, rare bacteria. Urine culture also grew mixed flora, 50-100 K.  She is just completed a course of Cipro.  This is her fifth urinary tract infection of the last 12 months. Her only documented infection was on 11/17 growing enterococcus.  She is very anxious today about these infections. Although she has no daytime frequency or further dysuria, she does complain that she got up 8 times last night to urinate which is significantly more than her average of 2. She is worried that this is due to on going infection.  She is taking cranberry tablets twice a day as well as probiotics.  PMH: Past Medical History:  Diagnosis Date  . Arthritis   . Collagen vascular disease (HCC)    RA  . Heartburn   . Hypertension     Surgical History: Past Surgical History:  Procedure Laterality Date  . ABDOMINAL HYSTERECTOMY    . BREAST CYST EXCISION      Home Medications:  Allergies as of 08/18/2016      Reactions   Codeine Nausea Only   Ibandronate Sodium    Ibandronate Sodium  [ibandronic Acid] Other (See Comments)   Gi upset   Risedronate Other (See Comments)   Gi upset   Risedronate Sodium    Sulfa Antibiotics Hives      Medication List       Accurate as of 08/18/16 11:59 PM. Always use your most recent med list.          amLODipine 5 MG tablet Commonly known as:  NORVASC TAKE 1  TABLET BY MOUTH EVERY DAY   calcium carbonate 500 MG chewable tablet Commonly known as:  TUMS - dosed in mg elemental calcium Chew by mouth.   folic acid 1 MG tablet Commonly known as:  FOLVITE TAKE 1 TABLET EVERY DAY   hydroxychloroquine 200 MG tablet Commonly known as:  PLAQUENIL Take 200 mg by mouth daily.   levothyroxine 75 MCG tablet Commonly known as:  SYNTHROID, LEVOTHROID   methotrexate 50 MG/2ML injection INJECT 0.6ML SUB-Q ONCE A WEEK   multivitamin capsule Take by mouth.       Allergies:  Allergies  Allergen Reactions  . Codeine Nausea Only  . Ibandronate Sodium   . Ibandronate Sodium  [Ibandronic Acid] Other (See Comments)    Gi upset  . Risedronate Other (See Comments)    Gi upset  . Risedronate Sodium   . Sulfa Antibiotics Hives    Family History: Family History  Problem Relation Age of Onset  . Kidney disease Neg Hx   . Bladder Cancer Neg Hx     Social History:  reports that she has never smoked. She has never used smokeless tobacco. She reports that she does not drink alcohol. Her drug history is not on file.  ROS: UROLOGY Frequent Urination?: No Hard to postpone urination?: No Burning/pain with  urination?: No Get up at night to urinate?: No Leakage of urine?: No Urine stream starts and stops?: No Trouble starting stream?: No Do you have to strain to urinate?: No Blood in urine?: No Urinary tract infection?: No Sexually transmitted disease?: No Injury to kidneys or bladder?: No Painful intercourse?: No Weak stream?: No Currently pregnant?: No Vaginal bleeding?: No Last menstrual period?: n  Gastrointestinal Nausea?: No Vomiting?: No Indigestion/heartburn?: No Diarrhea?: No Constipation?: No  Constitutional Fever: No Night sweats?: No Weight loss?: No Fatigue?: No  Skin Skin rash/lesions?: No Itching?: No  Eyes Blurred vision?: No Double vision?: No  Ears/Nose/Throat Sore throat?: No Sinus problems?:  No  Hematologic/Lymphatic Swollen glands?: No Easy bruising?: No  Cardiovascular Leg swelling?: No Chest pain?: No  Respiratory Cough?: No Shortness of breath?: No  Endocrine Excessive thirst?: No  Musculoskeletal Back pain?: No Joint pain?: No  Neurological Headaches?: No Dizziness?: No  Psychologic Depression?: No Anxiety?: No  Physical Exam:  BP (!) 148/76   Pulse 76   Ht 5\' 6"  (1.676 m)   Wt 150 lb (68 kg)   BMI 24.21 kg/m   Constitutional: Well nourished. Alert and oriented, No acute distress.  Appears younger than stated age. HEENT: [place] AT, moist mucus membranes. Trachea midline, no masses. Cardiovascular: No clubbing, cyanosis, or edema. Respiratory: Normal respiratory effort, no increased work of breathing. GI: Abdomen is soft, non tender, non distended, no abdominal masses. GU/ pelvic: Normal external genitalia. Atrophic vaginal tissue. Small urethral caruncle. Minimal urethral hypermobility. Good anterior apical and posterior vaginal support. No demonstrable stress urinary incontinence with Valsalva.  The patient's urethra was prepped using Betadine and a 14 French flexible red rubber was inserted for a catheterized specimen. Skin: No rashes, bruises or suspicious lesions. Neurologic: Grossly intact, no focal deficits, moving all 4 extremities. Psychiatric: Somewhat anxious. Tearful at times.  Laboratory Data: Creatinine 0.75/2018  UA: UA today was reviewed personally, see Epic. Dip was completely negative. No evidence of white blood cells, red blood cells, bacteria, or any other findings on urinalysis.   Assessment & Plan:    1. Recurrent UTI Reviewed pathophysiology of postmenopausal urinary tract infections STRONGLY recommended in the future, should she have concerns for urinary tract infection, like her to come in for same-day nurse visit her at our office for further assessment, UA and urine culture, ideally catheterized specimen No upper  tract pathology identified and most recent CT scan, adequate bladder emptying therefore noncontributory Continue probiotics and cranberry tabs twice a day  Patient was given samples of Premarin cream today to be used, pea-sized amount per urethral meatus Monday, Wednesday, and Friday. Anatomy for application was reviewed today in detail and all of her questions were answered.  We will send this prescription to her pharmacy. I would like her to continue this indefinitely.  2. Urinary frequency Previously tried Lisbeth Ply, uninterested in any other medications were OAB at this time  Return if symptoms worsen or fail to improve.   Hollice Espy, MD  [facility] [address] [place], [place] [address] [phone]  I spent 25 min with this patient of which greater than 50% was spent in counseling and coordination of care with the patient.

## 2016-08-20 ENCOUNTER — Ambulatory Visit
Admission: RE | Admit: 2016-08-20 | Discharge: 2016-08-20 | Disposition: A | Discharge status: Home / Self Care | Payer: Medicare Other | Source: Ambulatory Visit | Attending: Internal Medicine | Admitting: Internal Medicine

## 2016-08-20 DIAGNOSIS — I712 Thoracic aortic aneurysm, without rupture, unspecified: Secondary | ICD-10-CM

## 2016-08-20 MED ORDER — IOPAMIDOL (ISOVUE-370) INJECTION 76%
75.0000 mL | Freq: Once | INTRAVENOUS | Status: AC | PRN
  Administered 2016-08-20: 75 mL via INTRAVENOUS

## 2016-08-21 LAB — CULTURE, URINE COMPREHENSIVE

## 2017-09-01 ENCOUNTER — Other Ambulatory Visit: Payer: Self-pay | Admitting: Internal Medicine

## 2017-09-01 DIAGNOSIS — I7121 Aneurysm of the ascending aorta, without rupture: Secondary | ICD-10-CM

## 2017-09-01 DIAGNOSIS — I712 Thoracic aortic aneurysm, without rupture: Secondary | ICD-10-CM

## 2017-09-09 ENCOUNTER — Ambulatory Visit
Admission: RE | Admit: 2017-09-09 | Discharge: 2017-09-09 | Disposition: A | Discharge status: Home / Self Care | Payer: Medicare Other | Source: Ambulatory Visit | Attending: Internal Medicine | Admitting: Internal Medicine

## 2017-09-09 DIAGNOSIS — I2510 Atherosclerotic heart disease of native coronary artery without angina pectoris: Secondary | ICD-10-CM | POA: Insufficient documentation

## 2017-09-09 DIAGNOSIS — I700 Atherosclerosis of aorta: Secondary | ICD-10-CM | POA: Diagnosis not present

## 2017-09-09 DIAGNOSIS — I350 Nonrheumatic aortic (valve) stenosis: Secondary | ICD-10-CM | POA: Insufficient documentation

## 2017-09-09 DIAGNOSIS — I712 Thoracic aortic aneurysm, without rupture: Secondary | ICD-10-CM | POA: Insufficient documentation

## 2017-09-09 DIAGNOSIS — I7121 Aneurysm of the ascending aorta, without rupture: Secondary | ICD-10-CM

## 2017-09-09 MED ORDER — IOPAMIDOL (ISOVUE-370) INJECTION 76%
75.0000 mL | Freq: Once | INTRAVENOUS | Status: AC | PRN
  Administered 2017-09-09: 75 mL via INTRAVENOUS

## 2017-09-12 ENCOUNTER — Ambulatory Visit (INDEPENDENT_AMBULATORY_CARE_PROVIDER_SITE_OTHER): Payer: Medicare Other

## 2017-09-12 VITALS — BP 117/70 | HR 71 | Resp 16 | Ht 67.0 in | Wt 147.0 lb

## 2017-09-12 DIAGNOSIS — R350 Frequency of micturition: Secondary | ICD-10-CM | POA: Diagnosis not present

## 2017-09-12 LAB — BLADDER SCAN AMB NON-IMAGING: SCAN RESULT: 13

## 2017-09-12 LAB — MICROSCOPIC EXAMINATION

## 2017-09-12 LAB — URINALYSIS, COMPLETE
BILIRUBIN UA: NEGATIVE
Glucose, UA: NEGATIVE
Ketones, UA: NEGATIVE
Nitrite, UA: NEGATIVE
PH UA: 5.5 (ref 5.0–7.5)
Protein, UA: NEGATIVE
Specific Gravity, UA: 1.02 (ref 1.005–1.030)
UUROB: 0.2 mg/dL (ref 0.2–1.0)

## 2017-09-12 NOTE — Progress Notes (Signed)
Patient presents in office today for a nurse visit. Pt reports that her urinary frequency has increased, she is having difficulty postponing urination, and urinary urgency. Pt states she just does not feel good, something feels off.  A bladder scan was performed on pt, revealing 33ml of urine. Pt states she is very concerned about the possibility of having a UTI. A urine sample was collected via catheterization per Dr. Erlene Quan previous office note from 08/2016. Urinalysis was suspicious for infection, per Zara Council, we will wait for culture to come back before treating.   In and Out Catheterization  Patient is present today for a I & O catheterization due to urinary frequency and urgency. Patient was cleaned and prepped in a sterile fashion with betadine and Lidocaine 2% jelly was instilled into the urethra.  A 16FR cath was inserted no complications were noted , 73ml of urine return was noted, urine was yellow in color. A clean urine sample was collected for analysis and culture. Bladder was drained  And catheter was removed with out difficulty.    Preformed by: Cristie Hem, CMA   Blood pressure 117/70, pulse 71, resp. rate 16, height 5\' 7"  (1.702 m), weight 147 lb (66.7 kg), SpO2 96 %.  Follow-Up/Additional Notes: Given pt worsening urinary sx's, I advised her on the importance of following up with her urologist to discuss her plan of care. Pt agreed to make appointment w/ Dr. Erlene Quan to discuss her sx's. I instructed pt to increase her water intake and I would call her w/ urine results. Pt agreed.

## 2017-09-14 DIAGNOSIS — I739 Peripheral vascular disease, unspecified: Secondary | ICD-10-CM | POA: Insufficient documentation

## 2017-09-14 LAB — CULTURE, URINE COMPREHENSIVE

## 2017-10-28 ENCOUNTER — Ambulatory Visit: Payer: Medicare Other | Admitting: Urology

## 2017-10-28 ENCOUNTER — Other Ambulatory Visit: Payer: Self-pay

## 2017-10-28 ENCOUNTER — Encounter: Payer: Self-pay | Admitting: Urology

## 2017-10-28 VITALS — BP 130/74 | HR 75 | Resp 16 | Ht 67.0 in | Wt 144.0 lb

## 2017-10-28 DIAGNOSIS — R350 Frequency of micturition: Secondary | ICD-10-CM | POA: Diagnosis not present

## 2017-10-28 DIAGNOSIS — R351 Nocturia: Secondary | ICD-10-CM | POA: Diagnosis not present

## 2017-10-28 DIAGNOSIS — N390 Urinary tract infection, site not specified: Secondary | ICD-10-CM | POA: Diagnosis not present

## 2017-10-28 NOTE — Progress Notes (Signed)
10/28/2017 8:42 AM   Gloria [name] 07/20/1925 [id number]  Referring provider: Adin Hector, MD [facility] [place], [place] [address]  Chief Complaint  Patient presents with  . Recurrent UTI    HPI: Pleasant 84-year-old female who presents today for routine annual follow-up.  She is followed for history of recurrent urinary tract infections.  She reports that she is been taking 2 cranberry tablets daily as well as eating yogurt once a day for UTI prevention.  She did briefly use topical estrogen cream last year that was given to her during a visit, but she does not use this on a regular basis.  Over the past year, she is only had one urinary tract infection which was diagnosed and treated by her primary care physician on 08/23/2017 with a urine culture growing E. coli.  At the time, her symptoms included dysuria and frequency.  This is her only infection this year that she recalls.  She was also seen in our clinic with urinary symptoms but her urine was unremarkable and the culture was also negative.    She is concerned that a friend of hers developed a urinary tract infection with minimal to no symptoms and was admitted for sepsis.  She is worried that this might happen to her.  Today, she is asymptomatic.  She also has a history of urinary frequency.  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  She notes that her daytime symptoms are relatively minimal but she is now starting to get up 3 or 4 times each night to void.  She notes that every time she voids, it is a fairly large volume.  She is able to go back to sleep easily.  She wonders if this is part of the aging process.  She does not snore.     PMH: Past Medical History:  Diagnosis Date  . Arthritis   . Collagen vascular disease (HCC)    RA  . Heartburn   . Hypertension     Surgical History: Past Surgical History:  Procedure  Laterality Date  . ABDOMINAL HYSTERECTOMY    . BREAST CYST EXCISION      Home Medications:  Allergies as of 10/28/2017      Reactions   Codeine Nausea Only   Ibandronate Sodium    Ibandronate Sodium  [ibandronic Acid] Other (See Comments)   Gi upset   Risedronate Other (See Comments)   Gi upset   Risedronate Sodium    Sulfa Antibiotics Hives      Medication List        Accurate as of 10/28/17  8:42 AM. Always use your most recent med list.          amLODipine 5 MG tablet Commonly known as:  NORVASC TAKE 1 TABLET BY MOUTH EVERY DAY   calcium carbonate 500 MG chewable tablet Commonly known as:  TUMS - dosed in mg elemental calcium Chew by mouth.   estradiol 0.1 MG/GM vaginal cream Commonly known as:  ESTRACE Place 1 g vaginally 3 (three) times a week.   folic acid 1 MG tablet Commonly known as:  FOLVITE TAKE 1 TABLET EVERY DAY   hydroxychloroquine 200 MG tablet Commonly known as:  PLAQUENIL Take 200 mg by mouth daily.   levothyroxine 75 MCG tablet Commonly known as:  SYNTHROID, LEVOTHROID   methotrexate 50 MG/2ML injection INJECT 0.6ML SUB-Q ONCE A WEEK  multivitamin capsule Take by mouth.       Allergies:  Allergies  Allergen Reactions  . Codeine Nausea Only  . Ibandronate Sodium   . Ibandronate Sodium  [Ibandronic Acid] Other (See Comments)    Gi upset  . Risedronate Other (See Comments)    Gi upset  . Risedronate Sodium   . Sulfa Antibiotics Hives    Family History: Family History  Problem Relation Age of Onset  . Kidney disease Neg Hx   . Bladder Cancer Neg Hx     Social History:  reports that she has never smoked. She has never used smokeless tobacco. She reports that she does not drink alcohol. Her drug history is not on file.  ROS: UROLOGY Frequent Urination?: Yes Hard to postpone urination?: Yes Burning/pain with urination?: No Get up at night to urinate?: Yes Leakage of urine?: No Urine stream starts and stops?: No Trouble  starting stream?: No Do you have to strain to urinate?: No Blood in urine?: No Urinary tract infection?: No Sexually transmitted disease?: No Injury to kidneys or bladder?: No Painful intercourse?: No Weak stream?: No Currently pregnant?: No Vaginal bleeding?: No  Gastrointestinal Nausea?: No Vomiting?: No Indigestion/heartburn?: No Diarrhea?: No Constipation?: No  Constitutional Fever: No Night sweats?: No Weight loss?: No Fatigue?: No  Skin Skin rash/lesions?: No Itching?: No  Eyes Blurred vision?: No Double vision?: No  Ears/Nose/Throat Sore throat?: No Sinus problems?: No  Hematologic/Lymphatic Swollen glands?: No Easy bruising?: No  Cardiovascular Leg swelling?: No Chest pain?: No  Respiratory Cough?: No Shortness of breath?: No  Endocrine Excessive thirst?: No  Musculoskeletal Back pain?: No Joint pain?: No  Neurological Headaches?: No Dizziness?: No  Psychologic Depression?: No Anxiety?: No  Physical Exam: BP 130/74   Pulse 75   Resp 16   Ht 5\' 7"  (1.702 m)   Wt 144 lb (65.3 kg)   SpO2 96%   BMI 22.55 kg/m   Constitutional:  Alert and oriented, No acute distress.  Extremely pleasant, appears younger than stated age. HEENT: [place] AT, moist mucus membranes.  Trachea midline, no masses. Cardiovascular: No clubbing, cyanosis, or edema. Respiratory: Normal respiratory effort, no increased work of breathing. Skin: No rashes, bruises or suspicious lesions. Neurologic: Grossly intact, no focal deficits, moving all 4 extremities. Psychiatric: Normal mood and affect.  Laboratory Data: Lab Results  Component Value Date   WBC 8.3 07/04/2006   HGB 12.4 07/04/2006   HCT 36.5 07/04/2006   MCV 83.7 07/04/2006   PLT 232 07/04/2006    Urinalysis    Component Value Date/Time   APPEARANCEUR Clear 09/12/2017 0956   GLUCOSEU Negative 09/12/2017 0956   BILIRUBINUR Negative 09/12/2017 0956   PROTEINUR Negative 09/12/2017 0956   NITRITE  Negative 09/12/2017 0956   LEUKOCYTESUR Trace (A) 09/12/2017 0956    Lab Results  Component Value Date   LABMICR See below: 09/12/2017   WBCUA 6-10 (A) 09/12/2017   RBCUA 0-2 09/12/2017   LABEPIT 0-10 09/12/2017   MUCUS Present (A) 09/12/2017   BACTERIA Moderate (A) 09/12/2017    Pertinent Imaging: N/a  Assessment & Plan:    1. Recurrent UTI Doing well with only one documented infection this year I have encouraged her to come to our office with signs or symptoms of infection for same-day nurse visit if it is during the week Continue cranberry tablets twice daily Continue probiotics Encouraged to use Premarin cream of her UTIs increase in frequency  2. Nocturia We discussed the pathophysiology of nocturia which does  typically increase with age Overall, she is not particularly bothered by this We discussed that we could keep a voiding diary and perhaps her on a medication which ultimately she declined  3. Urinary frequency Minimal bother Previously in pharmacotherapy but not interested in starting any new medications  F/u prn  Hollice Espy, MD  [facility] [address] [place], [place] [address] [phone]

## 2018-02-13 NOTE — Progress Notes (Signed)
February 14, 2018  8:58 AM   Gloria [name] 16-Nov-1925 [id number]  Referring provider: Adin Hector, MD [facility] [place], [place] [address]  Chief Complaint  Patient presents with  . Urinary Frequency    HPI: Gloria [name] is a 84 year old female who returns today for p management and evaluation of recurrent UTI, nocturia, urinary frequency.   The patient's last visit with [place] was on 10/28/2017 at which time I offered her pharmacotherapy, she declined. She was offered voiding therapy, she declined.   Over the past year, the pt only had one urinary tract infection which was diagnosed by her PCP on 08/23/2017 with a urine culture growing E.coli. At the time, her symptoms included dysuria and frequency. This is her only infection this year that she recalls.  She also has a history of urinary frequency. The pt has taken Toviaz in the past as well.    The patient reports frequency of urination, getting up 4-5 times a night. Pt denies snoring, blood in the urine, and leg swelling.She reports using the cream occasionally, not often because she does not remember to. No medicines of bladder overactivity taken. Her PCP is Dr. Caryl Comes.  Pt has urge incontinence daily. The pt notes that she wears one pad daily, and denies incontinence at night.  She denies enuresis.   Most concerning today, the patient does not recall her visit in July discussing the same complaints/ issues.  She does not recall our discussion and treatment options.   No UTI symptoms today including no dysuria, gross hematuria, fever or chills.     PMH: Past Medical History:  Diagnosis Date  . Arthritis   . Collagen vascular disease (HCC)    RA  . Heartburn   . Hypertension     Surgical History: Past Surgical History:  Procedure Laterality Date  . ABDOMINAL HYSTERECTOMY    . BREAST CYST EXCISION      Home Medications:  Allergies as of 02/14/2018      Reactions   Codeine  Nausea Only   Ibandronate Sodium    Ibandronate Sodium  [ibandronic Acid] Other (See Comments)   Gi upset   Risedronate Other (See Comments)   Gi upset   Risedronate Sodium    Sulfa Antibiotics Hives      Medication List        Accurate as of 02/14/18  8:58 AM. Always use your most recent med list.          amLODipine 5 MG tablet Commonly known as:  NORVASC TAKE 1 TABLET BY MOUTH EVERY DAY   calcium carbonate 500 MG chewable tablet Commonly known as:  TUMS - dosed in mg elemental calcium Chew by mouth.   estradiol 0.1 MG/GM vaginal cream Commonly known as:  ESTRACE Place 1 g vaginally 3 (three) times a week.   folic acid 1 MG tablet Commonly known as:  FOLVITE TAKE 1 TABLET EVERY DAY   hydroxychloroquine 200 MG tablet Commonly known as:  PLAQUENIL Take 200 mg by mouth daily.   levothyroxine 75 MCG tablet Commonly known as:  SYNTHROID, LEVOTHROID   methotrexate 50 MG/2ML injection INJECT 0.6ML SUB-Q ONCE A WEEK   multivitamin capsule Take by mouth.       Allergies:  Allergies  Allergen Reactions  . Codeine Nausea Only  . Ibandronate Sodium   . Ibandronate Sodium  [Ibandronic Acid] Other (See Comments)    Gi upset  . Risedronate Other (See  Comments)    Gi upset  . Risedronate Sodium   . Sulfa Antibiotics Hives    Family History: Family History  Problem Relation Age of Onset  . Kidney disease Neg Hx   . Bladder Cancer Neg Hx     Social History:  reports that she has never smoked. She has never used smokeless tobacco. She reports that she does not drink alcohol. Her drug history is not on file.  ROS: UROLOGY Frequent Urination?: Yes Hard to postpone urination?: Yes Burning/pain with urination?: No Get up at night to urinate?: Yes Leakage of urine?: No Urine stream starts and stops?: No Trouble starting stream?: No Do you have to strain to urinate?: No Blood in urine?: No Urinary tract infection?: No Sexually transmitted disease?:  No Injury to kidneys or bladder?: No Painful intercourse?: No Weak stream?: No Currently pregnant?: No Vaginal bleeding?: No Last menstrual period?: n  Gastrointestinal Nausea?: No Vomiting?: No Indigestion/heartburn?: No Diarrhea?: No Constipation?: No  Constitutional Fever: No Night sweats?: No Weight loss?: No Fatigue?: No  Skin Skin rash/lesions?: No Itching?: No  Eyes Blurred vision?: No Double vision?: No  Ears/Nose/Throat Sore throat?: No Sinus problems?: No  Hematologic/Lymphatic Swollen glands?: No Easy bruising?: No  Cardiovascular Leg swelling?: No Chest pain?: No  Respiratory Cough?: No Shortness of breath?: No  Endocrine Excessive thirst?: No  Musculoskeletal Back pain?: No Joint pain?: No  Neurological Headaches?: No Dizziness?: No  Psychologic Depression?: No Anxiety?: No  Physical Exam: BP 114/69   Pulse 69   Wt 149 lb (67.6 kg)   BMI 23.34 kg/m   Constitutional:  Alert and oriented, No acute distress. HEENT: [place] AT, moist mucus membranes.  Trachea midline, no masses. Cardiovascular: No clubbing, cyanosis, or edema. Respiratory: Normal respiratory effort, no increased work of breathing. Skin: No rashes, bruises or suspicious lesions. Neurologic: Grossly intact, no focal deficits, moving all 4 extremities. Psychiatric: Normal mood and affect.  Laboratory Data: N/a  Urinalysis N/a  Assessment & Plan:    1.Recurrent UTI -Advised to continue cranberry tablets twice a week  -Advised to continue probiotics  --Encouraged to use Premarin cream of her UTIs increase in frequency to three times weekly as previously prescirbed  2. Nocturia  -Pt advised to keeps track of diary to write down time of urination, when, and how much the pt urinates for 2 days w/o medication use to evaluate bladder capacity vs excess urine production -Then pt is to obtain samples of Mybetriq 25 mg after completing diary, and I explained the possible  hypertensive effects of this medication -Advised not consuming water prior to bed -Will have the pt follow up with my colleague Zara Council, PA-C in 6 weeks to reassess   3. Urinary frequency  -Pt mentioned she has difficulty swallowing pills.  -Anti-cholinergics contra-indciated due to memory and age, and options are limited if not able to take tablets.    Return in about 6 weeks (around 03/28/2018) for shannon review voiding diary.  Hollice Espy, MD  [facility] [address], [place], [place] [address] [phone]  I, Lucas Mallow, am acting as a scribe for Dr. Hollice Espy,  I have reviewed the above documentation for accuracy and completeness, and I agree with the above.   Hollice Espy, MD

## 2018-02-14 ENCOUNTER — Encounter: Payer: Self-pay | Admitting: Urology

## 2018-02-14 ENCOUNTER — Ambulatory Visit (INDEPENDENT_AMBULATORY_CARE_PROVIDER_SITE_OTHER): Payer: Medicare Other | Admitting: Urology

## 2018-02-14 ENCOUNTER — Other Ambulatory Visit: Payer: Self-pay

## 2018-02-14 VITALS — BP 114/69 | HR 69 | Wt 149.0 lb

## 2018-02-14 DIAGNOSIS — R351 Nocturia: Secondary | ICD-10-CM

## 2018-02-14 DIAGNOSIS — N390 Urinary tract infection, site not specified: Secondary | ICD-10-CM | POA: Diagnosis not present

## 2018-02-14 DIAGNOSIS — D649 Anemia, unspecified: Secondary | ICD-10-CM | POA: Insufficient documentation

## 2018-02-14 DIAGNOSIS — R350 Frequency of micturition: Secondary | ICD-10-CM | POA: Diagnosis not present

## 2018-04-07 ENCOUNTER — Ambulatory Visit: Payer: Medicare Other | Admitting: Urology

## 2018-04-07 ENCOUNTER — Encounter: Payer: Self-pay | Admitting: Urology

## 2019-04-06 ENCOUNTER — Emergency Department: Payer: Medicare PPO

## 2019-04-06 ENCOUNTER — Emergency Department
Admission: EM | Admit: 2019-04-06 | Discharge: 2019-04-06 | Disposition: A | Discharge status: Home / Self Care | Payer: Medicare PPO | Attending: Emergency Medicine | Admitting: Emergency Medicine

## 2019-04-06 ENCOUNTER — Other Ambulatory Visit: Payer: Self-pay

## 2019-04-06 DIAGNOSIS — E039 Hypothyroidism, unspecified: Secondary | ICD-10-CM | POA: Insufficient documentation

## 2019-04-06 DIAGNOSIS — I10 Essential (primary) hypertension: Secondary | ICD-10-CM | POA: Insufficient documentation

## 2019-04-06 DIAGNOSIS — Z79899 Other long term (current) drug therapy: Secondary | ICD-10-CM | POA: Diagnosis not present

## 2019-04-06 DIAGNOSIS — I71019 Dissection of thoracic aorta, unspecified: Secondary | ICD-10-CM

## 2019-04-06 DIAGNOSIS — R072 Precordial pain: Secondary | ICD-10-CM | POA: Diagnosis not present

## 2019-04-06 DIAGNOSIS — R0789 Other chest pain: Secondary | ICD-10-CM | POA: Diagnosis present

## 2019-04-06 DIAGNOSIS — Z20822 Contact with and (suspected) exposure to covid-19: Secondary | ICD-10-CM | POA: Insufficient documentation

## 2019-04-06 DIAGNOSIS — I7101 Dissection of thoracic aorta: Secondary | ICD-10-CM | POA: Insufficient documentation

## 2019-04-06 LAB — COMPREHENSIVE METABOLIC PANEL
ALT: 16 U/L (ref 0–44)
AST: 24 U/L (ref 15–41)
Albumin: 4.1 g/dL (ref 3.5–5.0)
Alkaline Phosphatase: 62 U/L (ref 38–126)
Anion gap: 8 (ref 5–15)
BUN: 16 mg/dL (ref 8–23)
CO2: 26 mmol/L (ref 22–32)
Calcium: 8.9 mg/dL (ref 8.9–10.3)
Chloride: 107 mmol/L (ref 98–111)
Creatinine, Ser: 0.61 mg/dL (ref 0.44–1.00)
GFR calc Af Amer: >60 mL/min (ref >60)
GFR calc non Af Amer: >60 mL/min (ref >60)
Glucose, Bld: 110 mg/dL — ABNORMAL HIGH (ref 70–99)
Potassium: 3.4 mmol/L — ABNORMAL LOW (ref 3.5–5.1)
Sodium: 141 mmol/L (ref 135–145)
Total Bilirubin: 0.7 mg/dL (ref 0.3–1.2)
Total Protein: 7.2 g/dL (ref 6.5–8.1)

## 2019-04-06 LAB — CBC WITH DIFFERENTIAL/PLATELET
Abs Immature Granulocytes: 0.04 10*3/uL (ref 0.00–0.07)
Basophils Absolute: 0.1 10*3/uL (ref 0.0–0.1)
Basophils Relative: 1 %
Eosinophils Absolute: 0.1 10*3/uL (ref 0.0–0.5)
Eosinophils Relative: 1 %
HCT: 37.9 % (ref 36.0–46.0)
Hemoglobin: 12 g/dL (ref 12.0–15.0)
Immature Granulocytes: 1 %
Lymphocytes Relative: 24 %
Lymphs Abs: 2 10*3/uL (ref 0.7–4.0)
MCH: 30.3 pg (ref 26.0–34.0)
MCHC: 31.7 g/dL (ref 30.0–36.0)
MCV: 95.7 fL (ref 80.0–100.0)
Monocytes Absolute: 0.7 10*3/uL (ref 0.1–1.0)
Monocytes Relative: 8 %
Neutro Abs: 5.6 10*3/uL (ref 1.7–7.7)
Neutrophils Relative %: 65 %
Platelets: 164 10*3/uL (ref 150–400)
RBC: 3.96 MIL/uL (ref 3.87–5.11)
RDW: 15.1 % (ref 11.5–15.5)
WBC: 8.4 10*3/uL (ref 4.0–10.5)
nRBC: 0 % (ref 0.0–0.2)

## 2019-04-06 LAB — LIPASE, BLOOD: Lipase: 27 U/L (ref 11–51)

## 2019-04-06 LAB — RESPIRATORY PANEL BY RT PCR (FLU A&B, COVID)
Influenza A by PCR: NEGATIVE
Influenza B by PCR: NEGATIVE
SARS Coronavirus 2 by RT PCR: NEGATIVE

## 2019-04-06 LAB — TROPONIN I (HIGH SENSITIVITY)
Troponin I (High Sensitivity): 5 ng/L (ref <18)
Troponin I (High Sensitivity): 6 ng/L (ref <18)

## 2019-04-06 MED ORDER — NICARDIPINE HCL IN NACL 20-0.86 MG/200ML-% IV SOLN
3.0000 mg/h | INTRAVENOUS | Status: DC
  Filled 2019-04-06: qty 200

## 2019-04-06 MED ORDER — FENTANYL CITRATE (PF) 100 MCG/2ML IJ SOLN
50.0000 ug | Freq: Once | INTRAMUSCULAR | Status: AC
  Administered 2019-04-06: 50 ug via INTRAVENOUS
  Filled 2019-04-06: qty 2

## 2019-04-06 MED ORDER — SODIUM CHLORIDE 0.9 % IV BOLUS
500.0000 mL | Freq: Once | INTRAVENOUS | Status: AC
  Administered 2019-04-06: 18:00:00 500 mL via INTRAVENOUS

## 2019-04-06 MED ORDER — LABETALOL HCL 5 MG/ML IV SOLN
10.0000 mg | Freq: Once | INTRAVENOUS | Status: DC
  Filled 2019-04-06: qty 4

## 2019-04-06 MED ORDER — ESMOLOL HCL-SODIUM CHLORIDE 2000 MG/100ML IV SOLN: 25.0000 ug/kg/min | INTRAVENOUS | Status: DC

## 2019-04-06 MED ORDER — ESMOLOL HCL-SODIUM CHLORIDE 2000 MG/100ML IV SOLN
15.0000 ug/kg/min | INTRAVENOUS | Status: DC
  Administered 2019-04-06: 25 ug/kg/min via INTRAVENOUS
  Filled 2019-04-06: qty 100

## 2019-04-06 MED ORDER — NITROGLYCERIN 0.4 MG SL SUBL
0.4000 mg | SUBLINGUAL_TABLET | SUBLINGUAL | Status: DC | PRN
  Administered 2019-04-06: 0.4 mg via SUBLINGUAL

## 2019-04-06 MED ORDER — ALUM & MAG HYDROXIDE-SIMETH 200-200-20 MG/5ML PO SUSP
30.0000 mL | Freq: Once | ORAL | Status: AC
  Administered 2019-04-06: 18:00:00 30 mL via ORAL
  Filled 2019-04-06: qty 30

## 2019-04-06 MED ORDER — IOHEXOL 350 MG/ML SOLN
100.0000 mL | Freq: Once | INTRAVENOUS | Status: AC | PRN
  Administered 2019-04-06: 100 mL via INTRAVENOUS

## 2019-04-06 NOTE — ED Provider Notes (Signed)
[facility] Emergency Department Provider Note  ____________________________________________  Time seen: Approximately 7:02 PM  I have reviewed the triage vital signs and the nursing notes.   HISTORY  Chief Complaint Chest Pain    HPI Gloria [name] Gloria [name] is a 86 y.o. female with a history of rheumatoid arthritis, heartburn, hypertension who was in her usual state of health when she had a rapid onset of central chest pain radiating to the back that started at about 5 PM today.  Constant, no aggravating or alleviating factors, feels heavy and "like I need to belch."  No shortness of breath diaphoresis or vomiting.  Not exertional, not pleuritic.  No cough fevers or chills.  Moderate intensity.  Given 324 of aspirin and a nitro spray by EMS without improvement so far in her pain level.      Past Medical History:  Diagnosis Date  . Arthritis   . Collagen vascular disease (HCC)    RA  . Heartburn   . Hypertension      Patient Active Problem List   Diagnosis Date Noted  . Anemia 02/14/2018  . Peripheral vascular disease ([place]) 09/14/2017  . Aneurysm of ascending aorta (HCC) 02/14/2015  . Essential hypertension 01/21/2014  . HYPOTHYROIDISM 02/01/2007  . GERD 02/01/2007  . ARTHRITIS, RHEUMATOID 02/01/2007  . OSTEOPENIA 02/01/2007  . COLONIC POLYPS, HX OF 02/01/2007  . DISEASE, CELIAC 04/05/2002  . POLYMYALGIA RHEUMATICA 04/05/2001     Past Surgical History:  Procedure Laterality Date  . ABDOMINAL HYSTERECTOMY    . BREAST CYST EXCISION       Prior to Admission medications   Medication Sig Start Date End Date Taking? Authorizing Provider  amLODipine (NORVASC) 5 MG tablet TAKE 1 TABLET BY MOUTH EVERY DAY 10/29/13  Yes [provider]  folic acid (FOLVITE) 1 MG tablet TAKE 1 TABLET EVERY DAY 03/28/14  Yes [provider]  levothyroxine (SYNTHROID, LEVOTHROID) 75 MCG tablet Take 75 mcg by mouth daily.  02/01/14  Yes [provider]  methotrexate (RHEUMATREX) 2.5 MG tablet TAKE 6 TABLETS (15 MG TOTAL) BY MOUTH EVERY 7 (SEVEN) DAYS FOR 168 DAYS 02/23/19  Yes [provider]  Multiple Vitamin (MULTIVITAMIN WITH MINERALS) TABS tablet Take 1 tablet by mouth daily.   Yes [provider]  predniSONE (DELTASONE) 5 MG tablet Take 2.5 mg by mouth daily. 02/23/19  Yes [provider]  traMADol (ULTRAM) 50 MG tablet Take 50 mg by mouth 2 (two) times daily as needed. 02/19/19  Yes [provider]     Allergies Codeine, Ibandronate sodium, Ibandronate sodium  [ibandronic acid], Risedronate, Risedronate sodium, and Sulfa antibiotics   Family History  Problem Relation Age of Onset  . Kidney disease Neg Hx   . Bladder Cancer Neg Hx     Social History Social History   Tobacco Use  . Smoking status: Never Smoker  . Smokeless tobacco: Never Used  Substance Use Topics  . Alcohol use: No    Alcohol/week: 0.0 standard drinks  . Drug use: Not on file    Review of Systems  Constitutional:   No fever or chills.  ENT:   No sore throat. No rhinorrhea. Cardiovascular:   Positive as above chest pain without syncope. Respiratory:   No dyspnea or cough. Gastrointestinal:   Negative for abdominal pain, vomiting and diarrhea.  Musculoskeletal:   Negative for focal pain or swelling All other systems reviewed and are negative except as documented above in ROS and HPI.  ____________________________________________  PHYSICAL EXAM:  VITAL SIGNS: ED Triage Vitals  Enc Vitals Group     BP 04/06/19 1811 (!) 172/78     Pulse Rate 04/06/19 1811 68     Resp 04/06/19 1811 (!) 21     Temp 04/06/19 1811 98.7 F (37.1 C)     Temp Source 04/06/19 1811 Oral     SpO2 04/06/19 1811 100 %     Weight 04/06/19 1812 150 lb (68 kg)     Height 04/06/19 1812 5\' 7"  (1.702 m)     Head Circumference --      Peak Flow --      Pain Score 04/06/19 1811 4     Pain Loc --      Pain Edu? --       Excl. in [place]? --     Vital signs reviewed, nursing assessments reviewed.   Constitutional:   Alert and oriented. Non-toxic appearance. Eyes:   Conjunctivae are normal. EOMI. PERRL. ENT      Head:   Normocephalic and atraumatic.      Nose:   Wearing a mask.      Mouth/Throat:   Wearing a mask.      Neck:   No meningismus. Full ROM. Hematological/Lymphatic/Immunilogical:   No cervical lymphadenopathy. Cardiovascular:   RRR. Symmetric bilateral radial and DP pulses.  No murmurs. Cap refill less than 2 seconds. Respiratory:   Normal respiratory effort without tachypnea/retractions. Breath sounds are clear and equal bilaterally. No wheezes/rales/rhonchi. Gastrointestinal:   Soft and nontender. Non distended. There is no CVA tenderness.  No rebound, rigidity, or guarding.  Musculoskeletal:   Normal range of motion in all extremities. No joint effusions.  No lower extremity tenderness.  No edema. Neurologic:   Normal speech and language.  Motor grossly intact. No acute focal neurologic deficits are appreciated.  Skin:    Skin is warm, dry and intact. No rash noted.  No petechiae, purpura, or bullae.  ____________________________________________    LABS (pertinent positives/negatives) (all labs ordered are listed, but only abnormal results are displayed) Labs Reviewed  COMPREHENSIVE METABOLIC PANEL - Abnormal; Notable for the following components:      Result Value   Potassium 3.4 (*)    Glucose, Bld 110 (*)    All other components within normal limits  RESPIRATORY PANEL BY RT PCR (FLU A&B, COVID)  LIPASE, BLOOD  CBC WITH DIFFERENTIAL/PLATELET  TROPONIN I (HIGH SENSITIVITY)  TROPONIN I (HIGH SENSITIVITY)   ____________________________________________   EKG  Interpreted by me Sinus rhythm rate of 72.  Left axis, first-degree AV block.  Poor R wave progression.  Normal ST segments and T waves.  No acute ischemic changes.  ____________________________________________     [id number]  DG Chest 2 View  Result Date: 04/06/2019 CLINICAL DATA:  Central to chest pain EXAM: CHEST - 2 VIEW COMPARISON:  CT dated September 09, 2017. FINDINGS: Again noted is aneurysmal dilatation of the thoracic aorta. The lungs are hyperexpanded. There are areas of pleuroparenchymal scarring bilaterally. The heart size is normal. There is no acute osseous abnormality detected. No focal infiltrate. IMPRESSION: No active cardiopulmonary disease. Electronically Signed   By: Constance Holster M.D.   On: 04/06/2019 19:13   CT Angio Chest/Abd/Pel for Dissection W and/or Wo Contrast  Result Date: 04/06/2019 CLINICAL DATA:  Chest pain. EXAM: CT ANGIOGRAPHY CHEST, ABDOMEN AND PELVIS TECHNIQUE: Multidetector CT imaging through the chest, abdomen and pelvis was performed using the standard protocol during bolus administration of intravenous contrast. Multiplanar reconstructed  images and MIPs were obtained and reviewed to evaluate the vascular anatomy. CONTRAST:  171mL OMNIPAQUE IOHEXOL 350 MG/ML SOLN COMPARISON:  CT dated September 09, 2017. FINDINGS: CTA CHEST FINDINGS Cardiovascular: Again noted is a thoracic aortic aneurysm involving the ascending aorta measure approximately 4.1 cm. This is stable from prior study. There are new penetrating ulcers at the distal aortic arch beyond the takeoff of the left subclavian artery. There is developing mild acute intramural hematoma which extends superiorly and surrounds the right brachiocephalic artery. The arch vessels remain patent. There is inferior extension of the acute intramural hematoma to about the T11-T12 level. There may be a very early dissection flap at the aortic arch (axial series 5, image 35). Mediastinum/Nodes: --No mediastinal or hilar lymphadenopathy. --No axillary lymphadenopathy. --No supraclavicular lymphadenopathy. --Normal thyroid gland. --The esophagus is unremarkable Lungs/Pleura: There is subtle scattered mucous plugging with tree-in-bud opacities in  the right upper lobe. There is no large focal infiltrate. No pneumothorax. The trachea is unremarkable. There is pleuroparenchymal scarring at the lung bases. There is a 7 mm airspace opacity in the left upper lobe (axial series 6, image 38). Musculoskeletal: No chest wall abnormality. No acute or significant osseous findings. Review of the MIP images confirms the above findings. CTA ABDOMEN AND PELVIS FINDINGS VASCULAR Aorta: Normal caliber aorta without aneurysm, dissection, vasculitis or significant stenosis. Celiac: There is a high-grade stenosis at the origin of the celiac axis SMA: Patent without evidence of aneurysm, dissection, vasculitis or significant stenosis. Renals: Both renal arteries are patent without evidence of aneurysm, dissection, vasculitis, fibromuscular dysplasia or significant stenosis. IMA: Patent without evidence of aneurysm, dissection, vasculitis or significant stenosis. Inflow: Patent without evidence of aneurysm, dissection, vasculitis or significant stenosis. Veins: No obvious venous abnormality within the limitations of this arterial phase study. Review of the MIP images confirms the above findings. NON-VASCULAR Hepatobiliary: There are peripheral hyperattenuating areas in the anterior segment 4A and B favored to represent a benign process such as THADS. normal gallbladder.There is no biliary ductal dilation. Pancreas: Normal contours without ductal dilatation. No peripancreatic fluid collection. Spleen: No splenic laceration or hematoma. Adrenals/Urinary Tract: --Adrenal glands: No adrenal hemorrhage. --Right kidney/ureter: No hydronephrosis or perinephric hematoma. --Left kidney/ureter: No hydronephrosis or perinephric hematoma. --Urinary bladder: Unremarkable. Stomach/Bowel: --Stomach/Duodenum: No hiatal hernia or other gastric abnormality. Normal duodenal course and caliber. --Small bowel: No dilatation or inflammation. --Colon: No focal abnormality. --Appendix: Normal.  Vascular/Lymphatic: There are atherosclerotic changes throughout the abdominal aorta without evidence for an abdominal aortic aneurysm. --No retroperitoneal lymphadenopathy. --No mesenteric lymphadenopathy. --No pelvic or inguinal lymphadenopathy. Reproductive: Status post hysterectomy. No adnexal mass. Other: No ascites or free air. The abdominal wall is normal. Musculoskeletal. No acute displaced fractures. Review of the MIP images confirms the above findings. IMPRESSION: 1. Findings concerning for an early thoracic aortic dissection or penetrating ulcer resulting in acute Type B intramural hematoma as detailed above. There is some retrograde extension of the acute intramural hematoma into the aortic arch as detailed above. The ascending aorta appears to be spared. 2. Stable size of the ascending thoracic aortic aneurysm measuring approximately 4.1 cm. 3. Scattered bilateral pulmonary opacities measuring up to approximately 7 mm in the upper pole. These are favored to be secondary to an infectious or inflammatory process. As such, an outpatient three-month follow-up CT is recommended for further evaluation. 4. 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 Aortic Atherosclerosis (ICD10-I70.0). These results were called by telephone at the time of interpretation on 04/06/2019 at 8:02 pm to  provider Brittin Janik , who verbally acknowledged these results. Electronically Signed   By: Constance Holster M.D.   On: 04/06/2019 20:06    ____________________________________________   PROCEDURES .Critical Care Performed by: Carrie Mew, MD Authorized by: Carrie Mew, MD   Critical care provider statement:    Critical care time (minutes):  85   Critical care time was exclusive of:  Separately billable procedures and treating other patients   Critical care was necessary to treat or prevent imminent or life-threatening deterioration of the following conditions:  Circulatory failure   Critical care  was time spent personally by me on the following activities:  Development of treatment plan with patient or surrogate, discussions with consultants, evaluation of patient's response to treatment, examination of patient, obtaining history from patient or surrogate, ordering and performing treatments and interventions, ordering and review of laboratory studies, ordering and review of radiographic studies, pulse oximetry, re-evaluation of patient's condition and review of old charts    ____________________________________________  DIFFERENTIAL DIAGNOSIS   Non-STEMI, GERD, pneumothorax, pneumonia, aortic dissection  CLINICAL IMPRESSION / ASSESSMENT AND PLAN / ED COURSE  Medications ordered in the ED: Medications  nitroGLYCERIN (NITROSTAT) SL tablet 0.4 mg (0.4 mg Sublingual Given 04/06/19 1821)  esmolol (BREVIBLOC) 2000 mg / 100 mL (20 mg/mL) infusion (95 mcg/kg/min  68 kg Intravenous Rate/Dose Change 04/06/19 2213)  nicardipine (CARDENE) 20mg  in 0.86% saline 251ml IV infusion (0.1 mg/ml) (5 mg/hr Intravenous New Bag/Given 04/06/19 2212)  sodium chloride 0.9 % bolus 500 mL (0 mLs Intravenous Stopped 04/06/19 2052)  alum & mag hydroxide-simeth (MAALOX/MYLANTA) 200-200-20 MG/5ML suspension 30 mL (30 mLs Oral Given 04/06/19 1821)  iohexol (OMNIPAQUE) 350 MG/ML injection 100 mL (100 mLs Intravenous Contrast Given 04/06/19 1917)  fentaNYL (SUBLIMAZE) injection 50 mcg (50 mcg Intravenous Given 04/06/19 2059)  fentaNYL (SUBLIMAZE) injection 50 mcg (50 mcg Intravenous Given 04/06/19 2128)    Pertinent labs & imaging results that were available during my care of the patient were reviewed by me and considered in my medical decision making (see chart for details).  Rugenia Hausauer was evaluated in Emergency Department on 04/06/2019 for the symptoms described in the history of present illness. She was evaluated in the context of the global COVID-19 pandemic, which necessitated consideration that the patient might be at  risk for infection with the SARS-CoV-2 virus that causes COVID-19. Institutional protocols and algorithms that pertain to the evaluation of patients at risk for COVID-19 are in a state of rapid change based on information released by regulatory bodies including the CDC and federal and state organizations. These policies and algorithms were followed during the patient's care in the ED.   Patient with history of hypertension, RA, ascending aortic aneurysm who presents with sudden onset of central chest pain radiating to the back.  EKG is nonischemic.  No pulse deficit on exam.  Vital signs unremarkable but with her severe sudden onset pain, I will need to get a CT angiogram of the chest while pursuing the lab work-up.  Clinical Course as of Apr 05 2232  Fri Apr 06, 2019  1901 Chest x-ray image viewed by me, unremarkable without evidence of pneumothorax pneumonia pulmonary edema.  Normal mediastinal silhouette.   [PS]  2008 CT scan discussed with radiology which does show evidence of interval ulceration and intramural hematoma at the area of the aortic arch.  He suspects this is a type B dissection as the ascending aorta shows no worrisome features apart from the known aneurysmal dilatation.   [  PS]  2015 Case discussed with vascular surgery Dr. Trula Slade who recommends ICU admission for aggressive blood pressure management and pain control.  We discussed that [facility] currently has no ICU beds and 2 patients in the ED waiting for ICU beds, so he feels it would be better to go ahead and pursue transfer to [facility] to ICU or ED to ED. [facility] contacted to initiate transfer   [PS]  2046 Discussed with patient and family who are all agreeable. Starting esmolol drip for initial blood pressure control.  Her heart rate has been in the 60s so esmolol titration may be limited necessitating earlier use of nicardipine.   [PS]  2049 Still waiting for callback from [facility]   [PS]  2117 received call back from  [facility] with Dr. Corinna Lines of [facility].  They report there should be ICU bed availability. He accepts to [facility], ICU under service of Dr. Marshell Garfinkel.   [PS]  2233 Current vital signs are 150/65, heart rate of 74.  Continuing to titrate nicardipine and esmolol.  Covid test is negative.   [PS]    Clinical Course User Index [PS] Carrie Mew, MD     ____________________________________________   FINAL CLINICAL IMPRESSION(S) / ED DIAGNOSES    Final diagnoses:  Acute thoracic aortic dissection (HCC)  Precordial pain  Hypertension   ED Discharge Orders    None      Portions of this note were generated with dragon dictation software. Dictation errors may occur despite best attempts at proofreading.   Carrie Mew, MD 04/06/19 2235

## 2019-04-06 NOTE — ED Notes (Signed)
Pt at xray

## 2019-04-06 NOTE — ED Triage Notes (Signed)
Reports central CP that radiates to back earlier today. Reports pain just in center of chest currently, causing belching. Pt hypertensive with EMS. Given nitro spray and 324 ASA. Pt with normal EKG with EMS. EDP at bedside.

## 2019-04-06 NOTE — ED Notes (Signed)
Patient transported to CT 

## 2019-04-06 NOTE — ED Notes (Signed)
MD updating pts family at this time

## 2019-04-06 NOTE — ED Notes (Signed)
Pt gives verbal consent for transfer 

## 2019-04-06 NOTE — ED Notes (Signed)
ED Provider at bedside. 

## 2019-04-07 ENCOUNTER — Inpatient Hospital Stay (HOSPITAL_COMMUNITY)
Admission: AD | Admit: 2019-04-07 | Discharge: 2019-04-12 | DRG: 220 | Disposition: A | Discharge status: Home / Self Care | Payer: Medicare PPO | Attending: Critical Care Medicine | Admitting: Critical Care Medicine

## 2019-04-07 ENCOUNTER — Encounter (HOSPITAL_COMMUNITY): Payer: Self-pay | Admitting: Critical Care Medicine

## 2019-04-07 DIAGNOSIS — I10 Essential (primary) hypertension: Secondary | ICD-10-CM | POA: Diagnosis present

## 2019-04-07 DIAGNOSIS — Z7952 Long term (current) use of systemic steroids: Secondary | ICD-10-CM | POA: Diagnosis not present

## 2019-04-07 DIAGNOSIS — D72829 Elevated white blood cell count, unspecified: Secondary | ICD-10-CM | POA: Diagnosis not present

## 2019-04-07 DIAGNOSIS — Z79899 Other long term (current) drug therapy: Secondary | ICD-10-CM | POA: Diagnosis not present

## 2019-04-07 DIAGNOSIS — G8929 Other chronic pain: Secondary | ICD-10-CM | POA: Diagnosis present

## 2019-04-07 DIAGNOSIS — D500 Iron deficiency anemia secondary to blood loss (chronic): Secondary | ICD-10-CM | POA: Diagnosis present

## 2019-04-07 DIAGNOSIS — E039 Hypothyroidism, unspecified: Secondary | ICD-10-CM | POA: Diagnosis present

## 2019-04-07 DIAGNOSIS — E876 Hypokalemia: Secondary | ICD-10-CM | POA: Diagnosis not present

## 2019-04-07 DIAGNOSIS — Z885 Allergy status to narcotic agent status: Secondary | ICD-10-CM | POA: Diagnosis not present

## 2019-04-07 DIAGNOSIS — I472 Ventricular tachycardia: Secondary | ICD-10-CM | POA: Diagnosis present

## 2019-04-07 DIAGNOSIS — Z888 Allergy status to other drugs, medicaments and biological substances status: Secondary | ICD-10-CM

## 2019-04-07 DIAGNOSIS — D6959 Other secondary thrombocytopenia: Secondary | ICD-10-CM | POA: Diagnosis present

## 2019-04-07 DIAGNOSIS — I712 Thoracic aortic aneurysm, without rupture, unspecified: Secondary | ICD-10-CM

## 2019-04-07 DIAGNOSIS — J9811 Atelectasis: Secondary | ICD-10-CM | POA: Diagnosis not present

## 2019-04-07 DIAGNOSIS — I739 Peripheral vascular disease, unspecified: Secondary | ICD-10-CM | POA: Diagnosis not present

## 2019-04-07 DIAGNOSIS — Z79890 Hormone replacement therapy (postmenopausal): Secondary | ICD-10-CM

## 2019-04-07 DIAGNOSIS — I350 Nonrheumatic aortic (valve) stenosis: Secondary | ICD-10-CM | POA: Diagnosis present

## 2019-04-07 DIAGNOSIS — Z01810 Encounter for preprocedural cardiovascular examination: Secondary | ICD-10-CM | POA: Diagnosis not present

## 2019-04-07 DIAGNOSIS — I711 Thoracic aortic aneurysm, ruptured: Secondary | ICD-10-CM

## 2019-04-07 DIAGNOSIS — K219 Gastro-esophageal reflux disease without esophagitis: Secondary | ICD-10-CM | POA: Diagnosis present

## 2019-04-07 DIAGNOSIS — I7121 Aneurysm of the ascending aorta, without rupture: Secondary | ICD-10-CM | POA: Diagnosis present

## 2019-04-07 DIAGNOSIS — I7101 Dissection of thoracic aorta: Secondary | ICD-10-CM | POA: Diagnosis present

## 2019-04-07 DIAGNOSIS — J90 Pleural effusion, not elsewhere classified: Secondary | ICD-10-CM | POA: Diagnosis not present

## 2019-04-07 DIAGNOSIS — M353 Polymyalgia rheumatica: Secondary | ICD-10-CM | POA: Diagnosis present

## 2019-04-07 DIAGNOSIS — M545 Low back pain: Secondary | ICD-10-CM | POA: Diagnosis present

## 2019-04-07 DIAGNOSIS — M8580 Other specified disorders of bone density and structure, unspecified site: Secondary | ICD-10-CM | POA: Diagnosis present

## 2019-04-07 DIAGNOSIS — R519 Headache, unspecified: Secondary | ICD-10-CM | POA: Diagnosis present

## 2019-04-07 DIAGNOSIS — Z882 Allergy status to sulfonamides status: Secondary | ICD-10-CM

## 2019-04-07 DIAGNOSIS — I361 Nonrheumatic tricuspid (valve) insufficiency: Secondary | ICD-10-CM | POA: Diagnosis not present

## 2019-04-07 DIAGNOSIS — I71019 Dissection of thoracic aorta, unspecified: Secondary | ICD-10-CM

## 2019-04-07 DIAGNOSIS — Z90710 Acquired absence of both cervix and uterus: Secondary | ICD-10-CM | POA: Diagnosis not present

## 2019-04-07 DIAGNOSIS — Z20822 Contact with and (suspected) exposure to covid-19: Secondary | ICD-10-CM | POA: Diagnosis present

## 2019-04-07 HISTORY — DX: Aneurysm of the ascending aorta, without rupture: I71.21

## 2019-04-07 LAB — TSH: TSH: 0.124 u[IU]/mL — ABNORMAL LOW (ref 0.350–4.500)

## 2019-04-07 LAB — MAGNESIUM: Magnesium: 2.4 mg/dL (ref 1.7–2.4)

## 2019-04-07 LAB — BASIC METABOLIC PANEL
Anion gap: 8 (ref 5–15)
BUN: 14 mg/dL (ref 8–23)
CO2: 21 mmol/L — ABNORMAL LOW (ref 22–32)
Calcium: 8.3 mg/dL — ABNORMAL LOW (ref 8.9–10.3)
Chloride: 111 mmol/L (ref 98–111)
Creatinine, Ser: 0.6 mg/dL (ref 0.44–1.00)
GFR calc Af Amer: >60 mL/min (ref >60)
GFR calc non Af Amer: >60 mL/min (ref >60)
Glucose, Bld: 118 mg/dL — ABNORMAL HIGH (ref 70–99)
Potassium: 4 mmol/L (ref 3.5–5.1)
Sodium: 140 mmol/L (ref 135–145)

## 2019-04-07 LAB — PROCALCITONIN: Procalcitonin: <0.1 ng/mL

## 2019-04-07 LAB — CBC
HCT: 34.9 % — ABNORMAL LOW (ref 36.0–46.0)
Hemoglobin: 11.5 g/dL — ABNORMAL LOW (ref 12.0–15.0)
MCH: 31.3 pg (ref 26.0–34.0)
MCHC: 33 g/dL (ref 30.0–36.0)
MCV: 94.8 fL (ref 80.0–100.0)
Platelets: 148 10*3/uL — ABNORMAL LOW (ref 150–400)
RBC: 3.68 MIL/uL — ABNORMAL LOW (ref 3.87–5.11)
RDW: 15.2 % (ref 11.5–15.5)
WBC: 15.8 10*3/uL — ABNORMAL HIGH (ref 4.0–10.5)
nRBC: 0 % (ref 0.0–0.2)

## 2019-04-07 LAB — LIPID PANEL
Cholesterol: 207 mg/dL — ABNORMAL HIGH (ref 0–200)
HDL: 83 mg/dL (ref >40)
LDL Cholesterol: 112 mg/dL — ABNORMAL HIGH (ref 0–99)
Total CHOL/HDL Ratio: 2.5 RATIO
Triglycerides: 59 mg/dL (ref <150)
VLDL: 12 mg/dL (ref 0–40)

## 2019-04-07 LAB — CREATININE, SERUM
Creatinine, Ser: 0.66 mg/dL (ref 0.44–1.00)
GFR calc Af Amer: >60 mL/min (ref >60)
GFR calc non Af Amer: >60 mL/min (ref >60)

## 2019-04-07 LAB — MRSA PCR SCREENING: MRSA by PCR: NEGATIVE

## 2019-04-07 MED ORDER — METHOTREXATE 2.5 MG PO TABS: 15.0000 mg | ORAL_TABLET | ORAL | Status: DC

## 2019-04-07 MED ORDER — PANTOPRAZOLE SODIUM 40 MG IV SOLR: 40.0000 mg | INTRAVENOUS | Status: DC

## 2019-04-07 MED ORDER — PREDNISONE 2.5 MG PO TABS
2.5000 mg | ORAL_TABLET | Freq: Every day | ORAL | Status: DC
  Administered 2019-04-08 – 2019-04-12 (×5): 2.5 mg via ORAL
  Filled 2019-04-07 (×6): qty 1

## 2019-04-07 MED ORDER — HEPARIN SODIUM (PORCINE) 5000 UNIT/ML IJ SOLN
5000.0000 [IU] | Freq: Three times a day (TID) | INTRAMUSCULAR | Status: DC
  Administered 2019-04-07 – 2019-04-11 (×12): 5000 [IU] via SUBCUTANEOUS
  Filled 2019-04-07 (×11): qty 1

## 2019-04-07 MED ORDER — MORPHINE SULFATE (PF) 2 MG/ML IV SOLN
1.0000 mg | INTRAVENOUS | Status: DC | PRN
  Administered 2019-04-07 – 2019-04-11 (×4): 1 mg via INTRAVENOUS
  Filled 2019-04-07 (×4): qty 1

## 2019-04-07 MED ORDER — LEVOTHYROXINE SODIUM 75 MCG PO TABS
75.0000 ug | ORAL_TABLET | Freq: Every day | ORAL | Status: DC
  Administered 2019-04-08 – 2019-04-12 (×5): 75 ug via ORAL
  Filled 2019-04-07 (×5): qty 1

## 2019-04-07 MED ORDER — OXYCODONE HCL 5 MG PO TABS
5.0000 mg | ORAL_TABLET | ORAL | Status: DC | PRN
  Administered 2019-04-07 – 2019-04-08 (×2): 5 mg via ORAL
  Filled 2019-04-07 (×2): qty 1

## 2019-04-07 MED ORDER — CHLORHEXIDINE GLUCONATE CLOTH 2 % EX PADS
6.0000 | MEDICATED_PAD | Freq: Every day | CUTANEOUS | Status: DC
  Administered 2019-04-08 – 2019-04-11 (×4): 6 via TOPICAL

## 2019-04-07 MED ORDER — NICARDIPINE HCL IN NACL 20-0.86 MG/200ML-% IV SOLN
3.0000 mg/h | INTRAVENOUS | Status: DC
  Administered 2019-04-07 (×2): 5 mg/h via INTRAVENOUS
  Administered 2019-04-07 (×2): 7.5 mg/h via INTRAVENOUS
  Filled 2019-04-07 (×7): qty 200

## 2019-04-07 MED ORDER — ESMOLOL HCL-SODIUM CHLORIDE 2000 MG/100ML IV SOLN
25.0000 ug/kg/min | INTRAVENOUS | Status: DC
  Administered 2019-04-07: 02:00:00 100 ug/kg/min via INTRAVENOUS
  Administered 2019-04-07: 18:00:00 60 ug/kg/min via INTRAVENOUS
  Administered 2019-04-07: 14:00:00 80 ug/kg/min via INTRAVENOUS
  Administered 2019-04-07: 06:00:00 100 ug/kg/min via INTRAVENOUS
  Administered 2019-04-08: 02:00:00 60 ug/kg/min via INTRAVENOUS
  Administered 2019-04-08: 225 ug/kg/min via INTRAVENOUS
  Administered 2019-04-08: 20:00:00 175 ug/kg/min via INTRAVENOUS
  Administered 2019-04-08 (×2): 125 ug/kg/min via INTRAVENOUS
  Administered 2019-04-09: 14:00:00 300 ug/kg/min via INTRAVENOUS
  Administered 2019-04-09: 225 ug/kg/min via INTRAVENOUS
  Administered 2019-04-09: 01:00:00 200 ug/kg/min via INTRAVENOUS
  Administered 2019-04-09 (×2): 300 ug/kg/min via INTRAVENOUS
  Administered 2019-04-09: 06:00:00 250 ug/kg/min via INTRAVENOUS
  Administered 2019-04-09 (×2): 275 ug/kg/min via INTRAVENOUS
  Filled 2019-04-07 (×23): qty 100

## 2019-04-07 MED ORDER — FENTANYL CITRATE (PF) 100 MCG/2ML IJ SOLN
25.0000 ug | INTRAMUSCULAR | Status: DC | PRN
  Administered 2019-04-07: 03:00:00 50 ug via INTRAVENOUS
  Filled 2019-04-07 (×2): qty 2

## 2019-04-07 MED ORDER — FOLIC ACID 1 MG PO TABS
1.0000 mg | ORAL_TABLET | Freq: Every day | ORAL | Status: DC
  Administered 2019-04-07 – 2019-04-12 (×6): 1 mg via ORAL
  Filled 2019-04-07 (×6): qty 1

## 2019-04-07 MED ORDER — ONDANSETRON HCL 4 MG/2ML IJ SOLN
4.0000 mg | Freq: Four times a day (QID) | INTRAMUSCULAR | Status: DC | PRN
  Administered 2019-04-11: 12:00:00 4 mg via INTRAVENOUS

## 2019-04-07 MED ORDER — PANTOPRAZOLE SODIUM 40 MG PO TBEC
40.0000 mg | DELAYED_RELEASE_TABLET | Freq: Every day | ORAL | Status: DC
  Administered 2019-04-07 – 2019-04-12 (×6): 40 mg via ORAL
  Filled 2019-04-07 (×6): qty 1

## 2019-04-07 NOTE — Plan of Care (Signed)
Still experiencing pain and discomfort, stating pain medication does not really help.  Son is person visiting.  Medicated as per MAR> napping throughout day. Did get up to chair x1 unsteady on feet needs assistance. Uncomfortable up in chair, back to bed. Moving from side to side for comfort. Weaning down medication brevabloc and cardene.

## 2019-04-07 NOTE — Progress Notes (Signed)
[facility] Physician-Brief Progress Note Patient Name: Gloria [name] DOB: 04-Jan-1926 MRN: [id number]   Date of Service  04/07/2019  HPI/Events of Note  10 beat rum of NSVT   eICU Interventions  Will order: 1. BMP and Mg++ STAT.     Intervention Category Major Interventions: Arrhythmia - evaluation and management  Willie Loy Cornelia Copa 04/07/2019, 6:39 AM

## 2019-04-07 NOTE — H&P (Addendum)
..   NAME:  Gloria [name], MRN:  [id number], DOB:  10-Dec-1925, LOS: 0 ADMISSION DATE:  04/07/2019, CONSULTATION DATE:  04/07/2019 REFERRING MD:  Joni Fears MD [facility], CHIEF COMPLAINT:  Chest Pain   Brief History   86 yr old F w/ PMHx of RA, GERD, HTN, and thoracic aortic aneurysm  presented to [facility] on 04/06/2019 with constant chest pain radiating to the back. CT Angio showed an early thoracic aortic dissection or penetrating ulcer resulting in acute Type B intramural hematoma. Pt transferred to [facility] ICU for Aggressive Blood pressure management.  History of present illness   (History obtained from patient, EMR and from acct of other providers)  86 yr old F w/ PMHx of RA on Methotrexate, GERD, HTN, and thoracic aortic aneurysm (evaluated with CTA on 08/2016 and 09/2017>> Stable fusiform aneurysmal dilatation of the ascending thoracic aorta with a maximal diameter of 4.1 cm) Presented to [facility] on 04/06/2019 with complaints of constant chest pain radiating to her back that she stated was 4/10. It started around 1700 and patient stated that she felt heavy and like she needed to belch per EDP documentation.  With EMS she received Aspirin and Nitro spray. She was evaluated with CXR and CTA.  CT Angio showed an early thoracic aortic dissection or penetrating ulcer resulting in acute Type B intramural hematoma. EDP discussed case with Vascular Surgery Dr Trula Slade who recommended ICU admission. Case was discussed with PCCM and accepted to [facility] ICU by Dr Vaughan Browner.  On evaluation post transfer:  Initially complaining of a headache which has now subsided and pt is sleeping comfortably.  Past Medical History  .Marland Kitchen Active Ambulatory Problems    Diagnosis Date Noted  . HYPOTHYROIDISM 02/01/2007  . GERD 02/01/2007  . DISEASE, CELIAC 04/05/2002  . ARTHRITIS, RHEUMATOID 02/01/2007  . POLYMYALGIA RHEUMATICA 04/05/2001  . OSTEOPENIA 02/01/2007  . COLONIC POLYPS, HX OF 02/01/2007  . Anemia 02/14/2018  . Aneurysm of ascending  aorta (HCC) 02/14/2015  . Essential hypertension 01/21/2014  . Peripheral vascular disease ([place]) 09/14/2017   Resolved Ambulatory Problems    Diagnosis Date Noted  . No Resolved Ambulatory Problems   Past Medical History:  Diagnosis Date  . Arthritis   . Collagen vascular disease ([place])   . Heartburn   . Hypertension     Significant Hospital Events   Transferred from [facility]   Consults:  Vascular: 04/06/2019 PCCM: 04/06/2019  Procedures:  None at time of evaluation  Significant Diagnostic Tests:  04/06/2019 CT ANGIO CHEST/ABDOMEN/PELVIS IMPRESSION: 1. Findings concerning for an early thoracic aortic dissection or penetrating ulcer resulting in acute Type B intramural hematoma as detailed above. There is some retrograde extension of the acute intramural hematoma into the aortic arch as detailed above. The ascending aorta appears to be spared. 2. Stable size of the ascending thoracic aortic aneurysm measuring approximately 4.1 cm. 3. Scattered bilateral pulmonary opacities measuring up to approximately 7 mm in the upper pole. These are favored to be secondary to an infectious or inflammatory process. As such, an outpatient three-month follow-up CT is recommended for further evaluation. 4. 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  DG TWO VIEW CHEST FINDINGS: Again noted is aneurysmal dilatation of the thoracic aorta. The lungs are hyperexpanded. There are areas of pleuroparenchymal scarring bilaterally. The heart size is normal. There is no acute osseous abnormality detected. No focal infiltrate.  Micro Data:  MRSA PCR >> SARSCOV2>>>negative  Antimicrobials:  none   Interim history/subjective:  Post transfer from [facility] --> 2  H ICU  Objective   There were no vitals taken for this visit.    No intake or output data in the 24 hours ending 04/07/19 0053 There were no vitals filed for this visit.  Examination: General: well nourished female in no acute  distress HENT: normocephalic atraumatic  Lungs: decreased at bases no wheezing or rhonchi appreciated Cardiovascular: S1 and S2 appreciated Abdomen: soft not distended decreased BS Extremities: no appreciable edema full ROM Neuro: no focal deficits Skin: dry intact no rash   Assessment & Plan:  1.  Early Thoracic Aortic dissection or penetrating ulcer resulting in acute Type B intramural hematoma: Imaging shows retrograde extension of the acute intramural hematoma into the aortic arch Plan: Aggressive BP control with Esmolol and Nicardipine Goal SBP 165mmHg- 100 mmHg Vascular consulted by [facility]>> follow up their recommendations F/u Lipid profile Pain mgmt as needed.   2. Bilateral Pulmonary opacities- infectious vs inflammatory  COVID negative  Influenza A & B negative Afebrile on evaluation Pt has a h/o being on methotrexate for RA Plan: Check PCT to r/o infectious process If elevated start on CAP coverage and f/u cultures If normal no Abx needed may be secondary to MTX  3. HTN  Presenting BP 172/68mmHg Plan: Continues on Esmolol and Nicardipine Goal SBP 120-100 mmHg  4. High Grade stenosis of Celiac  No c/o abdominal pain, diarrhea, constipation CT showed no dilation or inflammation of the Bowel  5. H/o Hypothyroidism On Levothyroxine 44mcg daily per home med list Plan: F/U TSH  6. H/o Rheumatoid Arthritis On Methotrexate w/ Folic Acid  And Prednisone 5 mg Plan: Continue Prednisone, MTX and Folic acid  7 Hypokalemic  K= 3.4  Albumin wnl  Plan:  F/u Magnesium level Replete as needed  Best practice:  Diet: NPO for now Pain/Anxiety/Delirium protocol (if indicated): prn tramadol as needed for chest pain VAP protocol (if indicated): not intubated DVT prophylaxis: hep [place] GI prophylaxis: PPI  Glucose control: if BG exceeds 180mg /dl will start ISS Mobility: bedrest Code Status: Full Code Family Communication: updated post transfer Disposition:   Labs    CBC: Recent Labs  Lab 04/06/19 1813  WBC 8.4  NEUTROABS 5.6  HGB 12.0  HCT 37.9  MCV 95.7  PLT 123456    Basic Metabolic Panel: Recent Labs  Lab 04/06/19 1813  NA 141  K 3.4*  CL 107  CO2 26  GLUCOSE 110*  BUN 16  CREATININE 0.61  CALCIUM 8.9   GFR: Estimated Creatinine Clearance: 44.5 mL/min (by C-G formula based on SCr of 0.61 mg/dL). Recent Labs  Lab 04/06/19 1813  WBC 8.4    Liver Function Tests: Recent Labs  Lab 04/06/19 1813  AST 24  ALT 16  ALKPHOS 62  BILITOT 0.7  PROT 7.2  ALBUMIN 4.1   Recent Labs  Lab 04/06/19 1813  LIPASE 27   No results for input(s): AMMONIA in the last 168 hours.  ABG No results found for: PHART, PCO2ART, PO2ART, HCO3, TCO2, ACIDBASEDEF, O2SAT   Coagulation Profile: No results for input(s): INR, PROTIME in the last 168 hours.  Cardiac Enzymes: No results for input(s): CKTOTAL, CKMB, CKMBINDEX, TROPONINI in the last 168 hours.  HbA1C: No results found for: HGBA1C  CBG: No results for input(s): GLUCAP in the last 168 hours.  Review of Systems:   Marland KitchenMarland KitchenReview of Systems  Constitutional: Negative for chills and fever.  HENT: Negative.   Cardiovascular: Positive for chest pain.  Gastrointestinal: Negative.   Genitourinary: Negative.   Neurological:  Negative.   Endo/Heme/Allergies: Negative.   Psychiatric/Behavioral: Negative.      Past Medical History  She,  has a past medical history of Arthritis, Collagen vascular disease ([place]), Heartburn, and Hypertension.   Surgical History    Past Surgical History:  Procedure Laterality Date  . ABDOMINAL HYSTERECTOMY    . BREAST CYST EXCISION       Social History   reports that she has never smoked. She has never used smokeless tobacco. She reports that she does not drink alcohol.   Family History   Her family history is negative for Kidney disease and Bladder Cancer.   Allergies Allergies  Allergen Reactions  . Codeine Nausea Only  . Ibandronate Sodium   .  Ibandronate Sodium  [Ibandronic Acid] Other (See Comments)    Gi upset  . Risedronate Other (See Comments)    Gi upset  . Risedronate Sodium   . Sulfa Antibiotics Hives     Home Medications  Prior to Admission medications   Medication Sig Start Date End Date Taking? Authorizing Provider  amLODipine (NORVASC) 5 MG tablet TAKE 1 TABLET BY MOUTH EVERY DAY 10/29/13   [provider]  folic acid (FOLVITE) 1 MG tablet TAKE 1 TABLET EVERY DAY 03/28/14   [provider]  levothyroxine (SYNTHROID, LEVOTHROID) 75 MCG tablet Take 75 mcg by mouth daily.  02/01/14   [provider]  methotrexate (RHEUMATREX) 2.5 MG tablet TAKE 6 TABLETS (15 MG TOTAL) BY MOUTH EVERY 7 (SEVEN) DAYS FOR 168 DAYS 02/23/19   [provider]  Multiple Vitamin (MULTIVITAMIN WITH MINERALS) TABS tablet Take 1 tablet by mouth daily.    [provider]  predniSONE (DELTASONE) 5 MG tablet Take 2.5 mg by mouth daily. 02/23/19   [provider]  traMADol (ULTRAM) 50 MG tablet Take 50 mg by mouth 2 (two) times daily as needed. 02/19/19   [provider]    STAFF NOTE  I, Dr Seward Carol have personally reviewed patient's available data, including medical history, events of note, physical examination and test results as part of my evaluation. I have discussed with PA Gleason and other care providers such as pharmacist, RN and Elink.  The patient is critically ill with multiple organ systems failure and requires high complexity decision making for assessment and support, frequent evaluation and titration of therapies, application of advanced monitoring technologies and extensive interpretation of multiple databases.   Critical Care Time devoted to patient care services described in this note is 65 Minutes. This time reflects time of care of this signee Dr Seward Carol. This critical care time does not reflect procedure time, or teaching time or supervisory time but  could involve care discussion time   Dr. Seward Carol Pulmonary Critical Care Medicine  04/07/2019 1:21 AM    Critical care time: 65 mins

## 2019-04-07 NOTE — Progress Notes (Addendum)
PCCM progress note   Patient admitted overnight with complaints of severe back and chest pain and found to have intramural hematoma with small penetrating ulcers in the aortic arch.  Please see H&P for detailed history, assessment, and plan. As of morning 01/01 she remains stable with well-controlled blood pressure on esmolol and nicardipine.  Vascular surgery has evaluated patient this morning and feels that she may be a good candidate for thoracic stent graft will continue to manage strict blood pressure control and pain.  Vascular surgery will reevaluate surgical candidacy early next week.  Johnsie Cancel, NP-C [facility] Contact / Pager information can be found on Amion  04/07/2019, 8:06 AM

## 2019-04-07 NOTE — Progress Notes (Signed)
eLink Physician-Brief Progress Note Patient Name: Arloine Widmark DOB: 17-Mar-1926 MRN: [id number]   Date of Service  04/07/2019  HPI/Events of Note  Patient c/o chest pain.   eICU Interventions  Will order: 1. Fentanyl 25-50 mcg IV Q 2 hours PRN pain.     Intervention Category Major Interventions: Other:  Dominyk Law Cornelia Copa 04/07/2019, 2:14 AM

## 2019-04-07 NOTE — Consult Note (Signed)
ASSESSMENT & PLAN   ACUTE AORTIC SYNDROME: This patient developed severe back and chest pain and clearly has intramural hematoma which is new and small penetrating ulcers in the aortic arch just distal to the takeoff of the left subclavian artery.Marland Kitchen  Her blood pressure is now under good control on Esmolol and Nicardipine.  She only has pain when she takes a deep breath.  She is 86 years old but is in excellent condition for her age.  I think that her symptoms are most likely related to her aortic pathology and she may ultimately a thoracic stent graft.  She will need cardiac evaluation in anticipation of possible surgery. I have ordered an echo for Monday.  I will also get baseline ABIs.  She will likely need a follow-up CT early next week.  I have spoken to Dr. Trula Slade about this patient who has extensive experience with thoracic aortic pathology and will take over vascular care of this patient on Monday.  CELIAC AXIS STENOSIS: This is an incidental finding.  She has no postprandial abdominal pain or weight loss.  Her SMA and IMA are widely patent.  REASON FOR CONSULT:    Acute aortic syndrome.  The consult is requested by critical care medicine  HPI:   Gloria [name] is a 86 y.o. female with a history of a thoracic aortic aneurysm who presented to [facility] last night with chest pain radiating to her back.  Her CT angiogram showed a penetrating ulcer with intramural hematoma.  She was transferred to [facility] for aggressive blood pressure management and vascular surgery consultation.  On my history, the patient was not aware that she had a thoracic aneurysm.  Several months ago she developed some chest pain but this resolved fairly quickly and she did not think much of it.  Yesterday, at approximately 5 PM while at [employer], she developed the fairly sudden onset of pain in her upper back and subsequent chest pain.  She states that it hurts to take a deep breath.   She denies shortness of breath.  She went home and the pain persisted so she called for help and was brought by ambulance to [facility].  Initially her pain was "12 out of 10."  Currently, she does not have significant pain unless she takes a deep breath.  She lives independently.  She lost her husband 5 years ago.  She has a daughter who lives in [place].  She lives independently at a retired living community.  She remains fairly active and walks a mile a day.  She is unaware of any family history of aneurysmal disease.  She has never smoked.  She states that her blood pressure has always been under good control.  She denies any history of postprandial abdominal pain or weight loss.  Her primary care physician is Dr. Caryl Comes.  Past Medical History:  Diagnosis Date  . Arthritis   . Collagen vascular disease (HCC)    RA  . Heartburn   . Hypertension    She denies any history of diabetes, hypercholesterolemia, previous myocardial infarction, or history of congestive heart failure.  Family History  Problem Relation Age of Onset  . Kidney disease Neg Hx   . Bladder Cancer Neg Hx    She denies any family history of premature cardiovascular disease.  She is unaware of any history of aneurysmal disease in her family.  SOCIAL HISTORY: She has never smoked. Social History   Tobacco  Use  . Smoking status: Never Smoker  . Smokeless tobacco: Never Used  Substance Use Topics  . Alcohol use: No    Alcohol/week: 0.0 standard drinks    Allergies  Allergen Reactions  . Codeine Nausea Only  . Ibandronate Sodium   . Ibandronate Sodium  [Ibandronic Acid] Other (See Comments)    Gi upset  . Risedronate Other (See Comments)    Gi upset  . Risedronate Sodium   . Sulfa Antibiotics Hives    Current Facility-Administered Medications  Medication Dose Route Frequency Provider Last Rate Last Admin  . Chlorhexidine Gluconate Cloth 2 % PADS 6 each  6 each Topical Daily  Mannam, Praveen, MD      . esmolol (BREVIBLOC) 2000 mg / 100 mL (20 mg/mL) infusion  25-300 mcg/kg/min Intravenous Titrated Anders Simmonds, MD 20.4 mL/hr at 04/07/19 0613 100 mcg/kg/min at 04/07/19 0613  . fentaNYL (SUBLIMAZE) injection 25-50 mcg  25-50 mcg Intravenous Q2H PRN Anders Simmonds, MD   50 mcg at 04/07/19 0321  . folic acid (FOLVITE) tablet 1 mg  1 mg Oral Daily Scatliffe, Kristen D, MD      . heparin injection 5,000 Units  5,000 Units Subcutaneous Q8H Scatliffe, Kristen D, MD      . levothyroxine (SYNTHROID) tablet 75 mcg  75 mcg Oral Q0600 Scatliffe, Rise Paganini, MD      . Derrill Memo ON 04/13/2019] methotrexate (RHEUMATREX) tablet 15 mg  15 mg Oral Q Fri Scatliffe, Kristen D, MD      . nicardipine (CARDENE) 20mg  in 0.86% saline 262ml IV infusion (0.1 mg/ml)  3-15 mg/hr Intravenous Titrated Anders Simmonds, MD 50 mL/hr at 04/07/19 0611 5 mg/hr at 04/07/19 0611  . pantoprazole (PROTONIX) EC tablet 40 mg  40 mg Oral Daily Gleason, Otilio Carpen, PA-C      . predniSONE (DELTASONE) tablet 2.5 mg  2.5 mg Oral Q breakfast Scatliffe, Kristen D, MD        REVIEW OF SYSTEMS:  [X]  denotes positive finding, [ ]  denotes negative finding Cardiac  Comments:  Chest pain or chest pressure: x   Shortness of breath upon exertion:    Short of breath when lying flat:    Irregular heart rhythm:        Vascular    Pain in calf, thigh, or hip brought on by ambulation:    Pain in feet at night that wakes you up from your sleep:     Blood clot in your veins:    Leg swelling:         Pulmonary    Oxygen at home:    Productive cough:     Wheezing:         Neurologic    Sudden weakness in arms or legs:     Sudden numbness in arms or legs:     Sudden onset of difficulty speaking or slurred speech:    Temporary loss of vision in one eye:     Problems with dizziness:         Gastrointestinal    Blood in stool:     Vomited blood:         Genitourinary    Burning when urinating:     Blood in urine:          Psychiatric    Major depression:         Hematologic    Bleeding problems:    Problems with blood clotting too easily:  Skin    Rashes or ulcers:        Constitutional    Fever or chills:    -  PHYSICAL EXAM:   Vitals:   04/07/19 0700 04/07/19 0715 04/07/19 0726 04/07/19 0730  BP: (!) 113/59 (!) 112/56  (!) 112/57  Pulse: 68 70 71 70  Resp: 18 19 16 13   Temp:      TempSrc:      SpO2: 91% 92% 94% 96%  Weight:      Height:       Body mass index is 23.65 kg/m. GENERAL: The patient is a well-nourished female, in no acute distress. The vital signs are documented above. CARDIAC: There is a regular rate and rhythm.  She has a systolic murmur. VASCULAR: I do not detect carotid bruits. She has palpable radial pulses bilaterally. On the right side she has a palpable femoral and popliteal pulse.  I cannot palpate pedal pulses.  She has brisk but monophasic dorsalis pedis and posterior tibial signals. On the left side she has a palpable femoral and popliteal pulse.  I cannot palpate pedal pulses.  She has brisk but monophasic dorsalis pedis and posterior tibial signals. She has no significant lower extremity swelling. PULMONARY: There is good air exchange bilaterally without wheezing or rales. ABDOMEN: Soft and non-tender with normal pitched bowel sounds.  I do not palpate an aneurysm. MUSCULOSKELETAL: There are no major deformities. NEUROLOGIC: No focal weakness or paresthesias are detected. SKIN: There are no ulcers or rashes noted. PSYCHIATRIC: The patient has a normal affect.  DATA:    CT ANGIOGRAM CHEST ABDOMEN PELVIS: I have reviewed the images of her CT angiogram.  Her ascending aorta measures 4.1 cm which is reportedly stable compared to previous studies.  I do not see evidence of an aortic dissection.  The aorta just distal to the takeoff of the left subclavian artery measures 25 mm.  Beyond that the thoracic aorta measures approximately 28 mm in  diameter.  There are several penetrating ulcers noted in the distal aortic arch beyond the takeoff of the left subclavian artery.  These are fairly small and it is difficult to determine the depth of the ulcers.  The patient has intramural hematoma distal to the left subclavian artery but also some intramural hematoma around the innominate artery.  The intramural hematoma does not involve the ascending aorta.  There is a stenosis of the celiac axis at its origin.  She does not have significant occlusive disease of the aorta, iliac arteries, or femoral arteries bilaterally.  EKG: Sinus rhythm with first-degree AV block.  Septal infarct age undetermined.  CHEST X-RAY: This shows no evidence of active cardiopulmonary disease.  Lab Results  Component Value Date   WBC 15.8 (H) 04/07/2019   HGB 11.5 (L) 04/07/2019   HCT 34.9 (L) 04/07/2019   MCV 94.8 04/07/2019   PLT 148 (L) 04/07/2019   Lab Results  Component Value Date   NA 141 04/06/2019   K 3.4 (L) 04/06/2019   CL 107 04/06/2019   CO2 26 04/06/2019   Lab Results  Component Value Date   CREATININE 0.66 04/07/2019    Deitra Mayo [facility]: [phone] Office: [phone]

## 2019-04-07 NOTE — Progress Notes (Signed)
eLink Physician-Brief Progress Note Patient Name: Karagan Sirkin DOB: 1925/11/06 MRN: [id number]   Date of Service  04/07/2019  HPI/Events of Note  86 yo female presents with chest pain. Work up reveals Aortic Dissection. Currently on Esmolol and Cardene IV infusions to maintain SBP = 100-120. Now transferred to [facility] for further management. Vascular surgery ([place]) is aware of patient and should be consulted. Patient arrives on Esmolol and Cardene IV infusion. No orders for same.   eICU Interventions  Will order: 1. Esmolol and Cardene IV infusions. Titrate to SBP = 100-120.      Intervention Category Major Interventions: Hypertension - evaluation and management  Kentavius Dettore Eugene 04/07/2019, 12:32 AM

## 2019-04-07 NOTE — Progress Notes (Signed)
Patient had a 10 beat run of nonsustained wide complex tachycardia while resting in bed. Denied chest pain/shortness of breath. No overt signs of distress noted. ELink notified. Vital signs as per noted in flowsheets.

## 2019-04-08 ENCOUNTER — Inpatient Hospital Stay (HOSPITAL_COMMUNITY): Payer: Medicare PPO

## 2019-04-08 DIAGNOSIS — I7101 Dissection of thoracic aorta: Secondary | ICD-10-CM

## 2019-04-08 DIAGNOSIS — I71019 Dissection of thoracic aorta, unspecified: Secondary | ICD-10-CM

## 2019-04-08 HISTORY — DX: Dissection of thoracic aorta, unspecified: I71.019

## 2019-04-08 LAB — CBC
HCT: 33.4 % — ABNORMAL LOW (ref 36.0–46.0)
Hemoglobin: 11 g/dL — ABNORMAL LOW (ref 12.0–15.0)
MCH: 31.2 pg (ref 26.0–34.0)
MCHC: 32.9 g/dL (ref 30.0–36.0)
MCV: 94.6 fL (ref 80.0–100.0)
Platelets: 128 10*3/uL — ABNORMAL LOW (ref 150–400)
RBC: 3.53 MIL/uL — ABNORMAL LOW (ref 3.87–5.11)
RDW: 15.3 % (ref 11.5–15.5)
WBC: 13 10*3/uL — ABNORMAL HIGH (ref 4.0–10.5)
nRBC: 0 % (ref 0.0–0.2)

## 2019-04-08 LAB — BASIC METABOLIC PANEL
Anion gap: 7 (ref 5–15)
BUN: 13 mg/dL (ref 8–23)
CO2: 21 mmol/L — ABNORMAL LOW (ref 22–32)
Calcium: 8.1 mg/dL — ABNORMAL LOW (ref 8.9–10.3)
Chloride: 107 mmol/L (ref 98–111)
Creatinine, Ser: 0.67 mg/dL (ref 0.44–1.00)
GFR calc Af Amer: >60 mL/min (ref >60)
GFR calc non Af Amer: >60 mL/min (ref >60)
Glucose, Bld: 130 mg/dL — ABNORMAL HIGH (ref 70–99)
Potassium: 3.5 mmol/L (ref 3.5–5.1)
Sodium: 135 mmol/L (ref 135–145)

## 2019-04-08 MED ORDER — ORAL CARE MOUTH RINSE
15.0000 mL | Freq: Two times a day (BID) | OROMUCOSAL | Status: DC
  Administered 2019-04-08 – 2019-04-11 (×6): 15 mL via OROMUCOSAL

## 2019-04-08 MED ORDER — AMLODIPINE BESYLATE 5 MG PO TABS
5.0000 mg | ORAL_TABLET | Freq: Every day | ORAL | Status: DC
  Administered 2019-04-08 – 2019-04-09 (×2): 5 mg via ORAL
  Filled 2019-04-08 (×2): qty 1

## 2019-04-08 NOTE — Progress Notes (Signed)
   VASCULAR SURGERY ASSESSMENT & PLAN:   ACUTE AORTIC SYNDROME: Her chest and back pain have resolved.  She is having some low back pain which is helped with a heating pad.  She has some chronic low back pain.  Her blood pressure is under good control and she is off nicardipine.  She remains on esmolol.  I have ordered a 2D echo and will also consult cardiology tomorrow.  She will likely need a follow-up CT early next week.  She may be a candidate for thoracic stent graft repair of her penetrating ulcers and intramural hematoma.  SUBJECTIVE:   Some mild low back pain which is chronic.  PHYSICAL EXAM:   Vitals:   04/08/19 0600 04/08/19 0615 04/08/19 0630 04/08/19 0645  BP: 133/60 107/81 129/63 131/66  Pulse: 68 74 67 71  Resp: 20 18 19 19   Temp:      TempSrc:      SpO2: 94% 97% 92% 95%  Weight:      Height:       Palpable radial pulses. Palpable popliteal pulses. Both feet are warm and well-perfused.  LABS:   Lab Results  Component Value Date   WBC 13.0 (H) 04/08/2019   HGB 11.0 (L) 04/08/2019   HCT 33.4 (L) 04/08/2019   MCV 94.6 04/08/2019   PLT 128 (L) 04/08/2019   Lab Results  Component Value Date   CREATININE 0.67 04/08/2019    PROBLEM LIST:    Active Problems:   Thoracic ascending aortic aneurysm (HCC)   Intramural hematoma of thoracic aorta (HCC)   CURRENT MEDS:   . Chlorhexidine Gluconate Cloth  6 each Topical Daily  . folic acid  1 mg Oral Daily  . heparin  5,000 Units Subcutaneous Q8H  . levothyroxine  75 mcg Oral Q0600  . [START ON 04/13/2019] methotrexate  15 mg Oral Q Fri  . pantoprazole  40 mg Oral Daily  . predniSONE  2.5 mg Oral Q breakfast    Deitra Mayo Office: [phone] 04/08/2019

## 2019-04-08 NOTE — Progress Notes (Signed)
NAME:  Gloria [name], MRN:  [id number], DOB:  [birth year], LOS: 1 ADMISSION DATE:  04/07/2019, CONSULTATION DATE:  04/07/2019 REFERRING MD:  Joni Fears MD, CHIEF COMPLAINT:  Chest pain    Brief History   Patient is a 86yo female with PMHx of RA, GERD, HTN and thoracic aortic aneurysm who presented to [facility] on 04/06/2019 with constant chest pain radiating to back. CTA with early thoracic aortic dissection and penetrating ulcer resulting in acute Type B intramural hematoma. Pt transferred to [facility] ICU for vascular surgery consultation and aggressive BP management.   History of present illness   [name] is a 86 year old female with PMHx of RA on methotrexate, GERD, HTN and thoracic aortic aneurysm (CTA in 08/2016 and 09/2017 with stable fusiform aneurysmal dilation of ascending thoracic aorta w/diameter of 4.1cm) who presented with constant chest pain radiating to back, 4/10 in severity. It started around 1700 on day of admission and felt heavy like she needed to belch per EDP documentation. She denied shortness of breath.  With EMS, she received aspirin and nitro spray. Evaluated with CXR and CTA which showed early thoracic aortic dissection or penetrating ulcer resulting in acute Type B intramural hematoma. EDP discussed with vascular surgery, Dr. Trula Slade who recommended ICU admission.   Past Medical History  Hypothyroidism GERD Celiac disease Rheumatoid arthritis Polymyalgia rheumatica Osteopenia Anemia Aneurysm of ascending aorta Hypertension Peripheral vascular disease   Significant Hospital Events   1/2 Transferred from [facility]   Consults:  Vascular surgery 1/1  Procedures:  None   Significant Diagnostic Tests:  1/1 CT Angio Chest/Abd/Pelvis: early thoracic aortic dissection or penetrating ulcer resulting in acute type B intramural hematoma. Some retrograde extension of acute intramural hematoma into the aortic arch. Ascending aorta spared.  Stable size of ascending thoracic  aortic aneurysm measuring 4.1cm Scattered bilateral pulmonary opacities measuring up to ~32mm in upper pole. Favored to be secondary to infectious or inflammatory process. 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 Patient noted to be restless overnight and unable to find position of comfort. She notes mild lower back pain that is improved with heating pad. This seems to be chronic in nature.   Objective   Blood pressure 122/63, pulse 65, temperature 98 F (36.7 C), temperature source Oral, resp. rate (!) 21, height 5\' 7"  (1.702 m), weight 69.5 kg, SpO2 97 %.        Intake/Output Summary (Last 24 hours) at 04/08/2019 1321 Last data filed at 04/08/2019 1200 Gross per 24 hour  Intake 1345.81 ml  Output 1200 ml  Net 145.81 ml   Filed Weights   04/07/19 0030 04/07/19 0500 04/08/19 0500  Weight: 68.5 kg 68.5 kg 69.5 kg    Examination: Physical Exam  Constitutional: She is oriented to person, place, and time and well-developed, well-nourished, and in no distress.  HENT:  Head: Normocephalic and atraumatic.  Eyes: Conjunctivae and EOM are normal.  Cardiovascular: Normal rate, regular rhythm and intact distal pulses.  Murmur heard. Pulmonary/Chest: Effort normal and breath sounds normal. No respiratory distress. She has no wheezes. She has no rales.  Musculoskeletal:        General: Normal range of motion.     Cervical back: Normal range  of motion and neck supple.  Neurological: She is alert and oriented to person, place, and time.  Skin: Skin is warm and dry.   Labs   CBC: Recent Labs  Lab 04/06/19 1813 04/07/19 0219 04/08/19 0236  WBC 8.4 15.8* 13.0*  NEUTROABS 5.6  --   --   HGB 12.0 11.5* 11.0*  HCT 37.9 34.9* 33.4*   MCV 95.7 94.8 94.6  PLT 164 148* 128*    Basic Metabolic Panel: Recent Labs  Lab 04/06/19 1813 04/07/19 0219 04/07/19 0730 04/08/19 0236  NA 141  --  140 135  K 3.4*  --  4.0 3.5  CL 107  --  111 107  CO2 26  --  21* 21*  GLUCOSE 110*  --  118* 130*  BUN 16  --  14 13  CREATININE 0.61 0.66 0.60 0.67  CALCIUM 8.9  --  8.3* 8.1*  MG  --   --  2.4  --    GFR: Estimated Creatinine Clearance: 44.5 mL/min (by C-G formula based on SCr of 0.67 mg/dL). Recent Labs  Lab 04/06/19 1813 04/07/19 0219 04/08/19 0236  PROCALCITON  --  <0.10  --   WBC 8.4 15.8* 13.0*    Liver Function Tests: Recent Labs  Lab 04/06/19 1813  AST 24  ALT 16  ALKPHOS 62  BILITOT 0.7  PROT 7.2  ALBUMIN 4.1   Recent Labs  Lab 04/06/19 1813  LIPASE 27   No results for input(s): AMMONIA in the last 168 hours.  ABG No results found for: PHART, PCO2ART, PO2ART, HCO3, TCO2, ACIDBASEDEF, O2SAT   Coagulation Profile: No results for input(s): INR, PROTIME in the last 168 hours.  Cardiac Enzymes: No results for input(s): CKTOTAL, CKMB, CKMBINDEX, TROPONINI in the last 168 hours.  HbA1C: No results found for: HGBA1C  CBG: No results for input(s): GLUCAP in the last 168 hours.  Review of Systems:   Negative except as stated in HPI.   Past Medical History  She,  has a past medical history of Arthritis, Collagen vascular disease ([place]), Heartburn, and Hypertension.   Surgical History    Past Surgical History:  Procedure Laterality Date  . ABDOMINAL HYSTERECTOMY    . BREAST CYST EXCISION       Social History   reports that she has never smoked. She has never used smokeless tobacco. She reports that she does not drink alcohol.   Family History   Her family history is negative for Kidney disease and Bladder Cancer.   Allergies Allergies  Allergen Reactions  . Codeine Nausea Only  . Ibandronate Sodium   . Ibandronate Sodium  [Ibandronic Acid] Other (See Comments)    Gi upset  .  Risedronate Other (See Comments)    Gi upset  . Risedronate Sodium   . Sulfa Antibiotics Hives     Home Medications  Prior to Admission medications   Medication Sig Start Date End Date Taking? Authorizing Provider  amLODipine (NORVASC) 5 MG tablet TAKE 1 TABLET BY MOUTH EVERY DAY 10/29/13   [provider]  folic acid (FOLVITE) 1 MG tablet TAKE 1 TABLET EVERY DAY 03/28/14   [provider]  levothyroxine (SYNTHROID, LEVOTHROID) 75 MCG tablet Take 75 mcg by mouth daily.  02/01/14   [provider]  methotrexate (RHEUMATREX) 2.5 MG tablet TAKE 6 TABLETS (15 MG TOTAL) BY MOUTH EVERY 7 (SEVEN) DAYS FOR 168 DAYS 02/23/19   [provider]  Multiple Vitamin (MULTIVITAMIN WITH MINERALS) TABS tablet Take 1  tablet by mouth daily.    [provider]  predniSONE (DELTASONE) 5 MG tablet Take 2.5 mg by mouth daily. 02/23/19   [provider]  traMADol (ULTRAM) 50 MG tablet Take 50 mg by mouth 2 (two) times daily as needed. 02/19/19   [provider]    Resolved Hospital Problem list     Assessment & Plan:  Early thoracic aortic dissection or penetrating ulcer resulting in acute Type B intramural hematoma (Acute aortic syndrome):  - chest pain with radiation to back on presentation, currently improved - imaging with retrograde extension of acute intramural hematoma into aortic arch - patient is possible  P:  Vascular on board, appreciate their recommendations  Continue aggressive BP control w/Esmolol gtt and amlodipine  Goal SBP 100-120 mmHg 2D Echo today and cardiology consult  Follow up CT early next week per vascular   Bilateral pulmonary opacities - infectious vs inflammatory: - Patient is afebrile with improving leukocytosis and Procal <0.1, less likely to be infectious etiology P: Likely secondary to methotrexate, no need for antibiotics at this time Continue to monitor  Will need f/u CT chest in 3 months   Hypertension: P:  Continue esmolol gtt and amlodipine for goal SBP 100-154mmHg  Hypothyroidism:  P:  Continue synthroid 40mcg daily  Rheumatoid arthritis:  P: Continue methotrexate, folic acid and prednisone     Best practice:  Diet: heart healthy  Pain/Anxiety/Delirium protocol (if indicated): morphine and oxycodone  VAP protocol (if indicated): none DVT prophylaxis: heparin SQ q8h GI prophylaxis: Protonix Glucose control: none Mobility: activity as tolerated Code Status: FULL Family Communication:  Disposition: Continue ICU level care   Critical care time: 30 minutes

## 2019-04-08 NOTE — Plan of Care (Signed)
Having less pain, tired often, not getting good sleep in ICU. On esmolol. Only BP just 120 SBP  less pain than yesterday.   Nsg Dx  #1Potential for disorientation fall related to age, non familiar surroundings  Reorient patient  Bed alarm on   Observation frequently Hourly rounding Address pain, bathroom, positioning, Offer bedpan often  #2 Potential for decreased airway exchange related to pain, not taking deep breaths, atelectasis Encourage movement Up in chair as patient can a short intervals without straining Sit upright in the bed when awake IS Take deep breaths and cough Pain management so that above can occur Offer rest breaks frequently cluster care

## 2019-04-09 ENCOUNTER — Inpatient Hospital Stay (HOSPITAL_COMMUNITY): Payer: Medicare PPO

## 2019-04-09 ENCOUNTER — Inpatient Hospital Stay: Payer: Self-pay

## 2019-04-09 DIAGNOSIS — I350 Nonrheumatic aortic (valve) stenosis: Secondary | ICD-10-CM

## 2019-04-09 DIAGNOSIS — I7101 Dissection of thoracic aorta: Principal | ICD-10-CM

## 2019-04-09 DIAGNOSIS — I361 Nonrheumatic tricuspid (valve) insufficiency: Secondary | ICD-10-CM

## 2019-04-09 DIAGNOSIS — Z01810 Encounter for preprocedural cardiovascular examination: Secondary | ICD-10-CM

## 2019-04-09 DIAGNOSIS — I739 Peripheral vascular disease, unspecified: Secondary | ICD-10-CM

## 2019-04-09 LAB — CBC
HCT: 33.7 % — ABNORMAL LOW (ref 36.0–46.0)
Hemoglobin: 11.4 g/dL — ABNORMAL LOW (ref 12.0–15.0)
MCH: 31.7 pg (ref 26.0–34.0)
MCHC: 33.8 g/dL (ref 30.0–36.0)
MCV: 93.6 fL (ref 80.0–100.0)
Platelets: 114 10*3/uL — ABNORMAL LOW (ref 150–400)
RBC: 3.6 MIL/uL — ABNORMAL LOW (ref 3.87–5.11)
RDW: 14.6 % (ref 11.5–15.5)
WBC: 11.1 10*3/uL — ABNORMAL HIGH (ref 4.0–10.5)
nRBC: 0 % (ref 0.0–0.2)

## 2019-04-09 LAB — COMPREHENSIVE METABOLIC PANEL
ALT: 14 U/L (ref 0–44)
AST: 18 U/L (ref 15–41)
Albumin: 2.7 g/dL — ABNORMAL LOW (ref 3.5–5.0)
Alkaline Phosphatase: 46 U/L (ref 38–126)
Anion gap: 8 (ref 5–15)
BUN: 7 mg/dL — ABNORMAL LOW (ref 8–23)
CO2: 22 mmol/L (ref 22–32)
Calcium: 7.9 mg/dL — ABNORMAL LOW (ref 8.9–10.3)
Chloride: 106 mmol/L (ref 98–111)
Creatinine, Ser: 0.61 mg/dL (ref 0.44–1.00)
GFR calc Af Amer: >60 mL/min (ref >60)
GFR calc non Af Amer: >60 mL/min (ref >60)
Glucose, Bld: 124 mg/dL — ABNORMAL HIGH (ref 70–99)
Potassium: 3.2 mmol/L — ABNORMAL LOW (ref 3.5–5.1)
Sodium: 136 mmol/L (ref 135–145)
Total Bilirubin: 1.1 mg/dL (ref 0.3–1.2)
Total Protein: 5.7 g/dL — ABNORMAL LOW (ref 6.5–8.1)

## 2019-04-09 LAB — ECHOCARDIOGRAM COMPLETE
Height: 67 in
Weight: 2388.02 oz

## 2019-04-09 MED ORDER — NICARDIPINE HCL IN NACL 20-0.86 MG/200ML-% IV SOLN
3.0000 mg/h | INTRAVENOUS | Status: DC
  Administered 2019-04-09 (×2): 5 mg/h via INTRAVENOUS
  Administered 2019-04-10: 03:00:00 3 mg/h via INTRAVENOUS
  Filled 2019-04-09 (×4): qty 200

## 2019-04-09 MED ORDER — POTASSIUM CHLORIDE 20 MEQ PO PACK
40.0000 meq | PACK | Freq: Once | ORAL | Status: AC
  Administered 2019-04-09: 09:00:00 40 meq via ORAL
  Filled 2019-04-09: qty 2

## 2019-04-09 MED ORDER — CARVEDILOL 12.5 MG PO TABS: 12.5000 mg | ORAL_TABLET | Freq: Two times a day (BID) | ORAL | Status: DC

## 2019-04-09 MED ORDER — AMLODIPINE BESYLATE 5 MG PO TABS
5.0000 mg | ORAL_TABLET | Freq: Once | ORAL | Status: AC
  Administered 2019-04-09: 5 mg via ORAL
  Filled 2019-04-09: qty 1

## 2019-04-09 MED ORDER — CARVEDILOL 6.25 MG PO TABS
6.2500 mg | ORAL_TABLET | Freq: Two times a day (BID) | ORAL | Status: DC
  Administered 2019-04-09 – 2019-04-12 (×6): 6.25 mg via ORAL
  Filled 2019-04-09 (×6): qty 1

## 2019-04-09 MED ORDER — AMLODIPINE BESYLATE 10 MG PO TABS
10.0000 mg | ORAL_TABLET | Freq: Every day | ORAL | Status: DC
  Administered 2019-04-10 – 2019-04-12 (×3): 10 mg via ORAL
  Filled 2019-04-09 (×3): qty 1

## 2019-04-09 MED ORDER — SODIUM CHLORIDE 0.9% FLUSH: 10.0000 mL | INTRAVENOUS | Status: DC | PRN

## 2019-04-09 MED ORDER — IOHEXOL 350 MG/ML SOLN
100.0000 mL | Freq: Once | INTRAVENOUS | Status: AC | PRN
  Administered 2019-04-09: 100 mL via INTRAVENOUS

## 2019-04-09 MED ORDER — SODIUM CHLORIDE 0.9% FLUSH
10.0000 mL | Freq: Two times a day (BID) | INTRAVENOUS | Status: DC
  Administered 2019-04-09 – 2019-04-10 (×3): 10 mL

## 2019-04-09 NOTE — Progress Notes (Signed)
   VASCULAR SURGERY ASSESSMENT & PLAN:   ACUTE AORTIC SYNDROME: Currently she only has chest pain if she coughs. Her presenting symptoms have resolved. She is on esmolol to maintain her systolic blood pressure [id number]. Dr. Trula Slade to see patient today to discuss possible thoracic stent graft to address her penetrating ulcers and intramural hematoma, and the potential timing for this.  CARDIAC: The patient will be seen by cardiology today for preop evaluation in anticipation of possible thoracic stent. A 2D echo has also been ordered.  SUBJECTIVE:   No chest pain or back pain except when she coughs.  PHYSICAL EXAM:   Vitals:   04/09/19 0530 04/09/19 0600 04/09/19 0617 04/09/19 0620  BP: 127/67 126/64 126/65 123/67  Pulse: 64 63 69 65  Resp: (!) 22 20 15 15   Temp:      TempSrc:      SpO2: 95% 94% 97% 98%  Weight:      Height:       Lungs clear bilaterally. Feet are warm and well-perfused.  LABS:   Lab Results  Component Value Date   WBC 13.0 (H) 04/08/2019   HGB 11.0 (L) 04/08/2019   HCT 33.4 (L) 04/08/2019   MCV 94.6 04/08/2019   PLT 128 (L) 04/08/2019   Lab Results  Component Value Date   CREATININE 0.67 04/08/2019    PROBLEM LIST:    Active Problems:   Thoracic ascending aortic aneurysm (HCC)   Intramural hematoma of thoracic aorta (HCC)   CURRENT MEDS:   . amLODipine  5 mg Oral Daily  . Chlorhexidine Gluconate Cloth  6 each Topical Daily  . folic acid  1 mg Oral Daily  . heparin  5,000 Units Subcutaneous Q8H  . levothyroxine  75 mcg Oral Q0600  . mouth rinse  15 mL Mouth Rinse BID  . [START ON 04/13/2019] methotrexate  15 mg Oral Q Fri  . pantoprazole  40 mg Oral Daily  . predniSONE  2.5 mg Oral Q breakfast    Deitra Mayo Office: [phone] 04/09/2019

## 2019-04-09 NOTE — Progress Notes (Signed)
    Subjective  -   Only has pain when coughs or sneezes.  No pain at rest   Physical Exam:  Abd soft Palpable femoral pulses Neuro intact       Assessment/Plan:    Aortic IMH:  Scheduled for echo toady.  I would also like to repeat her CTA today to make sure there has been no change in her IMH.  Based on this, I will determine the most appropriate time to consider stent graft repair.  Continue blood pressure control with target < 130  Wells Dunia Pringle 04/09/2019 7:58 AM --  Vitals:   04/09/19 0700 04/09/19 0730  BP: 105/86 (!) 122/56  Pulse: 65 66  Resp: 16 16  Temp:    SpO2: 99% 100%    Intake/Output Summary (Last 24 hours) at 04/09/2019 0758 Last data filed at 04/09/2019 0732 Gross per 24 hour  Intake 1303.34 ml  Output 2275 ml  Net -971.66 ml     Laboratory CBC    Component Value Date/Time   WBC 13.0 (H) 04/08/2019 0236   HGB 11.0 (L) 04/08/2019 0236   HCT 33.4 (L) 04/08/2019 0236   PLT 128 (L) 04/08/2019 0236    BMET    Component Value Date/Time   NA 135 04/08/2019 0236   K 3.5 04/08/2019 0236   CL 107 04/08/2019 0236   CO2 21 (L) 04/08/2019 0236   GLUCOSE 130 (H) 04/08/2019 0236   BUN 13 04/08/2019 0236   CREATININE 0.67 04/08/2019 0236   CALCIUM 8.1 (L) 04/08/2019 0236   GFRNONAA >60 04/08/2019 0236   GFRAA >60 04/08/2019 0236    COAG No results found for: INR, PROTIME No results found for: PTT  Antibiotics Anti-infectives (From admission, onward)   None       V. Leia Alf, M.D., [facility] [place] Office: [phone] Pager:  [phone]

## 2019-04-09 NOTE — Progress Notes (Signed)
ABI completed. Refer to "CV Proc" under chart review to view preliminary results.  04/09/2019 5:52 PM Kelby Aline., MHA, RVT, RDCS, RDMS

## 2019-04-09 NOTE — Consult Note (Signed)
Cardiology Consultation:   Patient ID: Gloria [name] MRN: [id number]; DOB: 01/22/1926  Admit date: 04/07/2019 Date of Consult: 04/09/2019  Primary Care Provider: Adin Hector, MD Primary Cardiologist: No primary care provider on file.  Primary Electrophysiologist:  None    Patient Profile:   Gloria [name] is a 86 y.o. female with a hx of HTN and RA who is being seen today for the evaluation of preoperative cardiac evaluation at the request of Dr Scot Dock.  History of Present Illness:   Gloria [name] is admitted 04/07/2019 with acute aortic syndrome.  The patient has a history of ascending thoracic aortic dilatation with maximal diameter 4.1 cm.  [facility] with constant chest pain.  A CT angiogram demonstrated findings consistent with penetrating ulcer and intramural hematoma in the descending thoracic aorta consistent with type B acute aortic syndrome.  The patient is transferred to [facility] for further care.  She has been evaluated by Dr. Scot Dock and Dr. Trula Slade who planned endovascular treatment with an aortic stent graft.  Cardiology is asked to evaluate the patient for preoperative cardiac assessment.  At the time of my evaluation today, the patient is chest pain-free.  She continues to have some chest discomfort only with coughing.  She denies shortness of breath.  She denies any anginal symptoms with her normal activities.  She remains independent and active for her age.  She denies shortness of breath, heart palpitations, orthopnea, or PND.  She denies any history of syncope.  She has no personal history of heart attack or coronary artery disease.  During her admission, she has been noted to have a heart murmur and an echocardiogram has been completed.  Heart Pathway Score:     Past Medical History:  Diagnosis Date  . Arthritis   . Collagen vascular disease (HCC)    RA  . Heartburn   . Hypertension     Past  Surgical History:  Procedure Laterality Date  . ABDOMINAL HYSTERECTOMY    . BREAST CYST EXCISION       Home Medications:  Prior to Admission medications   Medication Sig Start Date End Date Taking? Authorizing Provider  amLODipine (NORVASC) 5 MG tablet TAKE 1 TABLET BY MOUTH EVERY DAY 10/29/13   [provider]  folic acid (FOLVITE) 1 MG tablet TAKE 1 TABLET EVERY DAY 03/28/14   [provider]  levothyroxine (SYNTHROID, LEVOTHROID) 75 MCG tablet Take 75 mcg by mouth daily.  02/01/14   [provider]  methotrexate (RHEUMATREX) 2.5 MG tablet TAKE 6 TABLETS (15 MG TOTAL) BY MOUTH EVERY 7 (SEVEN) DAYS FOR 168 DAYS 02/23/19   [provider]  Multiple Vitamin (MULTIVITAMIN WITH MINERALS) TABS tablet Take 1 tablet by mouth daily.    [provider]  predniSONE (DELTASONE) 5 MG tablet Take 2.5 mg by mouth daily. 02/23/19   [provider]  traMADol (ULTRAM) 50 MG tablet Take 50 mg by mouth 2 (two) times daily as needed. 02/19/19   [provider]    Inpatient Medications: Scheduled Meds: . amLODipine  5 mg Oral Daily  . Chlorhexidine Gluconate Cloth  6 each Topical Daily  . folic acid  1 mg Oral Daily  . heparin  5,000 Units Subcutaneous Q8H  . levothyroxine  75 mcg Oral Q0600  . mouth rinse  15 mL Mouth Rinse BID  . [START ON 04/13/2019] methotrexate  15 mg Oral Q Fri  . pantoprazole  40 mg Oral Daily  . predniSONE  2.5 mg Oral Q breakfast   Continuous Infusions: . esmolol 300 mcg/kg/min (04/09/19 1120)   PRN Meds: morphine injection, ondansetron (ZOFRAN) IV, oxyCODONE  Allergies:    Allergies  Allergen Reactions  . Codeine Nausea Only  . Ibandronate Sodium   . Ibandronate Sodium  [Ibandronic Acid] Other (See Comments)    Gi upset  . Risedronate Other (See Comments)    Gi upset  . Risedronate Sodium   . Sulfa Antibiotics Hives    Social History:   Social History   Socioeconomic History  . Marital status:  Widowed    Spouse name: Not on file  . Number of children: Not on file  . Years of education: Not on file  . Highest education level: Not on file  Occupational History  . Not on file  Tobacco Use  . Smoking status: Never Smoker  . Smokeless tobacco: Never Used  Substance and Sexual Activity  . Alcohol use: No    Alcohol/week: 0.0 standard drinks  . Drug use: Not on file  . Sexual activity: Not on file  Other Topics Concern  . Not on file  Social History Narrative  . Not on file   Social Determinants of Health   Financial Resource Strain:   . Difficulty of Paying Living Expenses: Not on file  Food Insecurity:   . Worried About Charity fundraiser in the Last Year: Not on file  . Ran Out of Food in the Last Year: Not on file  Transportation Needs:   . Lack of Transportation (Medical): Not on file  . Lack of Transportation (Non-Medical): Not on file  Physical Activity:   . Days of Exercise per Week: Not on file  . Minutes of Exercise per Session: Not on file  Stress:   . Feeling of Stress : Not on file  Social Connections:   . Frequency of Communication with Friends and Family: Not on file  . Frequency of Social Gatherings with Friends and Family: Not on file  . Attends Religious Services: Not on file  . Active Member of Clubs or Organizations: Not on file  . Attends Archivist Meetings: Not on file  . Marital Status: Not on file  Intimate Partner Violence:   . Fear of Current or Ex-Partner: Not on file  . Emotionally Abused: Not on file  . Physically Abused: Not on file  . Sexually Abused: Not on file    Family History:    Family History  Problem Relation Age of Onset  . Kidney disease Neg Hx   . Bladder Cancer Neg Hx      ROS:  Please see the history of present illness.  All other ROS reviewed and negative.     Physical Exam/Data:   Vitals:   04/09/19 1000 04/09/19 1100 04/09/19 1151 04/09/19 1200  BP: 140/69 123/66    Pulse: 71 62  64  Resp:  20 17  14   Temp:   99.4 F (37.4 C)   TempSrc:   Oral   SpO2: 94% 95%  95%  Weight:      Height:        Intake/Output Summary (Last 24 hours) at 04/09/2019 1214 Last data filed at 04/09/2019 1100 Gross per 24 hour  Intake 1267.52 ml  Output 2625 ml  Net -1357.48 ml   Last 3 Weights 04/09/2019 04/08/2019 04/07/2019  Weight (lbs) 149 lb 4 oz 153 lb 3.5 oz 151 lb 0.2  oz  Weight (kg) 67.7 kg 69.5 kg 68.5 kg     Body mass index is 23.38 kg/m.  General:  Well nourished, well developed, pleasant elderly woman in no acute distress HEENT: normal Lymph: no adenopathy Neck: no JVD Endocrine:  No thryomegaly Vascular: No carotid bruits; FA pulses 2+ bilaterally  Cardiac:  normal S1, S2; RRR; 2/6 harsh early peaking systolic ejection murmur at the right upper sternal border Lungs:  clear to auscultation bilaterally, no wheezing, rhonchi or rales  Abd: soft, nontender, no hepatomegaly  Ext: no edema Musculoskeletal:  No deformities, BUE and BLE strength normal and equal Skin: warm and dry  Neuro:  CNs 2-12 intact, no focal abnormalities noted Psych:  Normal affect   EKG:  The EKG was personally reviewed and demonstrates:  NSR 72 bpm, LAD, age-indeterminate septal infarct, wandering baseline  Telemetry:  Telemetry was personally reviewed and demonstrates: Normal sinus rhythm  Relevant CV Studies: Echo: IMPRESSIONS    1. Left ventricular ejection fraction, by visual estimation, is 60 to 65%. The left ventricle has normal function. There is no left ventricular hypertrophy.  2. Left ventricular diastolic parameters are indeterminate.  3. The left ventricle has no regional wall motion abnormalities.  4. Global right ventricle has normal systolic function.The right ventricular size is normal. No increase in right ventricular wall thickness.  5. Left atrial size was severely dilated.  6. Right atrial size was mildly dilated.  7. The mitral valve is normal in structure. Trivial mitral valve  regurgitation. No evidence of mitral stenosis.  8. The tricuspid valve is normal in structure.  9. The aortic valve is normal in structure. Aortic valve regurgitation is not visualized. 10. The pulmonic valve was normal in structure. Pulmonic valve regurgitation is not visualized. 11. Aneurysm of the ascending aorta. 12. Aortic dilatation noted. 13. There is moderate dilatation of the ascending aorta measuring 47 mm. 14. Normal pulmonary artery systolic pressure. 15. The atrial septum is grossly normal.  FINDINGS  Left Ventricle: Left ventricular ejection fraction, by visual estimation, is 60 to 65%. The left ventricle has normal function. The left ventricle has no regional wall motion abnormalities. There is no left ventricular hypertrophy. Left ventricular  diastolic parameters are indeterminate.  Right Ventricle: The right ventricular size is normal. No increase in right ventricular wall thickness. Global RV systolic function is has normal systolic function. The tricuspid regurgitant velocity is 2.47 m/s, and with an assumed right atrial pressure  of 3 mmHg, the estimated right ventricular systolic pressure is normal at 27.4 mmHg.  Left Atrium: Left atrial size was severely dilated.  Right Atrium: Right atrial size was mildly dilated  Pericardium: There is no evidence of pericardial effusion.  Mitral Valve: The mitral valve is normal in structure. Trivial mitral valve regurgitation. No evidence of mitral valve stenosis by observation.  Tricuspid Valve: The tricuspid valve is normal in structure. Tricuspid valve regurgitation is mild.  Aortic Valve: The aortic valve is normal in structure. Aortic valve regurgitation is not visualized. Aortic valve mean gradient measures 17.0 mmHg. Aortic valve peak gradient measures 24.1 mmHg. Aortic valve area, by VTI measures 1.81 cm.  Pulmonic Valve: The pulmonic valve was normal in structure. Pulmonic valve regurgitation is not  visualized. Pulmonic regurgitation is not visualized.  Aorta: Aortic dilatation noted. There is moderate dilatation of the ascending aorta measuring 47 mm. There is an aneurysm involving the ascending aorta.  IAS/Shunts: The atrial septum is grossly normal.  Laboratory Data:  High Sensitivity Troponin:  Recent Labs  Lab 04/06/19 1813 04/06/19 2009  TROPONINIHS 5 6     Chemistry Recent Labs  Lab 04/07/19 0730 04/08/19 0236 04/09/19 0718  NA 140 135 136  K 4.0 3.5 3.2*  CL 111 107 106  CO2 21* 21* 22  GLUCOSE 118* 130* 124*  BUN 14 13 7*  CREATININE 0.60 0.67 0.61  CALCIUM 8.3* 8.1* 7.9*  GFRNONAA >60 >60 >60  GFRAA >60 >60 >60  ANIONGAP 8 7 8     Recent Labs  Lab 04/06/19 1813 04/09/19 0718  PROT 7.2 5.7*  ALBUMIN 4.1 2.7*  AST 24 18  ALT 16 14  ALKPHOS 62 46  BILITOT 0.7 1.1   Hematology Recent Labs  Lab 04/07/19 0219 04/08/19 0236 04/09/19 0718  WBC 15.8* 13.0* 11.1*  RBC 3.68* 3.53* 3.60*  HGB 11.5* 11.0* 11.4*  HCT 34.9* 33.4* 33.7*  MCV 94.8 94.6 93.6  MCH 31.3 31.2 31.7  MCHC 33.0 32.9 33.8  RDW 15.2 15.3 14.6  PLT 148* 128* 114*   BNPNo results for input(s): BNP, PROBNP in the last 168 hours.  DDimer No results for input(s): DDIMER in the last 168 hours.   Radiology/Studies:  DG Chest 2 View  Result Date: 04/06/2019 CLINICAL DATA:  Central to chest pain EXAM: CHEST - 2 VIEW COMPARISON:  CT dated September 09, 2017. FINDINGS: Again noted is aneurysmal dilatation of the thoracic aorta. The lungs are hyperexpanded. There are areas of pleuroparenchymal scarring bilaterally. The heart size is normal. There is no acute osseous abnormality detected. No focal infiltrate. IMPRESSION: No active cardiopulmonary disease. Electronically Signed   By: Constance Holster M.D.   On: 04/06/2019 19:13   ECHOCARDIOGRAM COMPLETE  Result Date: 04/09/2019   ECHOCARDIOGRAM REPORT   Patient Name:   [facility] Date of Exam: 04/09/2019 Medical Rec #:  [id number]          Height:       67.0 in Accession #:    [id number]        Weight:       149.3 lb Date of Birth:  October 18, 1925         BSA:          1.79 m Patient Age:    89 years          BP:           108/84 mmHg Patient Gender: F                 HR:           68 bpm. Exam Location:  Inpatient Procedure: 2D Echo Indications:    Aortic dissection 441.01 / I71.0  History:        Patient has no prior history of Echocardiogram examinations.                 Risk Factors:Hypertension. Thoracic ascending aortic aneurysm.  Sonographer:    Vikki Ports Turrentine Referring Phys: Aubrey  1. Left ventricular ejection fraction, by visual estimation, is 60 to 65%. The left ventricle has normal function. There is no left ventricular hypertrophy.  2. Left ventricular diastolic parameters are indeterminate.  3. The left ventricle has no regional wall motion abnormalities.  4. Global right ventricle has normal systolic function.The right ventricular size is normal. No increase in right ventricular wall thickness.  5. Left atrial size was severely dilated.  6. Right atrial size was mildly dilated.  7. The mitral valve is normal in  structure. Trivial mitral valve regurgitation. No evidence of mitral stenosis.  8. The tricuspid valve is normal in structure.  9. The aortic valve is normal in structure. Aortic valve regurgitation is not visualized. 10. The pulmonic valve was normal in structure. Pulmonic valve regurgitation is not visualized. 11. Aneurysm of the ascending aorta. 12. Aortic dilatation noted. 13. There is moderate dilatation of the ascending aorta measuring 47 mm. 14. Normal pulmonary artery systolic pressure. 15. The atrial septum is grossly normal. FINDINGS  Left Ventricle: Left ventricular ejection fraction, by visual estimation, is 60 to 65%. The left ventricle has normal function. The left ventricle has no regional wall motion abnormalities. There is no left ventricular hypertrophy. Left ventricular  diastolic parameters are indeterminate. Right Ventricle: The right ventricular size is normal. No increase in right ventricular wall thickness. Global RV systolic function is has normal systolic function. The tricuspid regurgitant velocity is 2.47 m/s, and with an assumed right atrial pressure  of 3 mmHg, the estimated right ventricular systolic pressure is normal at 27.4 mmHg. Left Atrium: Left atrial size was severely dilated. Right Atrium: Right atrial size was mildly dilated Pericardium: There is no evidence of pericardial effusion. Mitral Valve: The mitral valve is normal in structure. Trivial mitral valve regurgitation. No evidence of mitral valve stenosis by observation. Tricuspid Valve: The tricuspid valve is normal in structure. Tricuspid valve regurgitation is mild. Aortic Valve: The aortic valve is normal in structure. Aortic valve regurgitation is not visualized. Aortic valve mean gradient measures 17.0 mmHg. Aortic valve peak gradient measures 24.1 mmHg. Aortic valve area, by VTI measures 1.81 cm. Pulmonic Valve: The pulmonic valve was normal in structure. Pulmonic valve regurgitation is not visualized. Pulmonic regurgitation is not visualized. Aorta: Aortic dilatation noted. There is moderate dilatation of the ascending aorta measuring 47 mm. There is an aneurysm involving the ascending aorta. IAS/Shunts: The atrial septum is grossly normal.  LEFT VENTRICLE PLAX 2D LVIDd:         4.00 cm  Diastology LVIDs:         2.50 cm  LV e' lateral:   8.05 cm/s LV PW:         1.00 cm  LV E/e' lateral: 10.2 LV IVS:        1.00 cm  LV e' medial:    6.31 cm/s LVOT diam:     2.00 cm  LV E/e' medial:  13.0 LV SV:         48 ml LV SV Index:   26.58 LVOT Area:     3.14 cm  RIGHT VENTRICLE RV S prime:     11.30 cm/s TAPSE (M-mode): 2.4 cm LEFT ATRIUM             Index       RIGHT ATRIUM           Index LA diam:        4.90 cm 2.74 cm/m  RA Area:     22.20 cm LA Vol (A2C):   92.6 ml 51.86 ml/m RA Volume:   67.00 ml   37.52 ml/m LA Vol (A4C):   88.5 ml 49.56 ml/m LA Biplane Vol: 92.9 ml 52.02 ml/m  AORTIC VALVE AV Area (Vmax):    1.73 cm AV Area (Vmean):   1.84 cm AV Area (VTI):     1.81 cm AV Vmax:           245.40 cm/s AV Vmean:          175.400 cm/s  AV VTI:            0.525 m AV Peak Grad:      24.1 mmHg AV Mean Grad:      17.0 mmHg LVOT Vmax:         135.00 cm/s LVOT Vmean:        103.000 cm/s LVOT VTI:          0.303 m LVOT/AV VTI ratio: 0.58  AORTA Ao Root diam: 2.80 cm MITRAL VALVE                        TRICUSPID VALVE MV Area (PHT): 5.02 cm             TR Peak grad:   24.4 mmHg MV PHT:        43.79 msec           TR Vmax:        248.00 cm/s MV Decel Time: 151 msec MV E velocity: 81.80 cm/s 103 cm/s  SHUNTS MV A velocity: 93.40 cm/s 70.3 cm/s Systemic VTI:  0.30 m MV E/A ratio:  0.88       1.5       Systemic Diam: 2.00 cm  Mertie Moores MD Electronically signed by Mertie Moores MD Signature Date/Time: 04/09/2019/10:56:47 AM    Final    CT Angio Chest/Abd/Pel for Dissection W and/or W/WO  Result Date: 04/09/2019 CLINICAL DATA:  86 year old female with a history of acute intramural hematoma and ongoing clinical status change. Evaluate for progression. EXAM: CT ANGIOGRAPHY CHEST, ABDOMEN AND PELVIS TECHNIQUE: Multidetector CT imaging through the chest, abdomen and pelvis was performed using the standard protocol during bolus administration of intravenous contrast. Multiplanar reconstructed images and MIPs were obtained and reviewed to evaluate the vascular anatomy. CONTRAST:  163mL OMNIPAQUE IOHEXOL 350 MG/ML SOLN COMPARISON:  CTA chest 04/06/2019 FINDINGS: CTA CHEST FINDINGS Cardiovascular: Progressive acute intramural hematoma arising from a penetrating atherosclerotic ulcer at the 9 o'clock position of the proximal descending thoracic aorta (image 45, series 8). This penetrating ulceration results in scattered contrast opacification within the media of the transverse and proximal descending thoracic aorta. The  degree of contrast opacification within the aortic media is increased compared to the prior study. The maximal diameter of the aorta at the site of ulceration is now 4.5 cm compared to 4.1 cm previously. Intramural hematoma extends both superiorly throughout the arch and into the ascending thoracic aorta, and distally to the T11 level. The degree of intramural hematoma surrounding the right brachiocephalic artery is similar. The volume of small intramural hematoma surrounding the ascending thoracic aorta remains stable. The maximum ascending thoracic aortic diameter is 4.3 cm. The aortic root remains normal in caliber. No effacement of the sino-tubular junction. Mildly thickened and calcified aortic valve again noted. The heart remains within normal limits in size. The main pulmonary artery remains normal in size. No significant pericardial effusion. Mediastinum/Nodes: No significant mediastinal hemorrhage. Mildly patulous thoracic esophagus containing fluid. Lungs/Pleura: Interval development of small to moderate bilateral layering pleural effusions slightly larger on the left than the right. There is associated bilateral lower lobe atelectasis. Stable small foci of ground-glass attenuation airspace opacity in both upper lungs compared to the recent prior imaging. No evidence of pulmonary edema or pneumothorax. Musculoskeletal: No acute fracture or aggressive appearing lytic or blastic osseous lesion. Review of the MIP images confirms the above findings. CTA ABDOMEN AND PELVIS FINDINGS VASCULAR Aorta: The abdominal aorta remains unchanged in caliber  and appearance. No evidence of extension of intramural hematoma below the diaphragm. Scattered atherosclerotic plaque throughout the aorta. Celiac: High-grade stenosis versus occlusion at the origin of the celiac artery. Mild aneurysmal dilation of the origin of the common hepatic artery up to 0.9 cm. SMA: The origin is widely patent. No aneurysm or dissection. Replaced  right hepatic artery. Renals: The main renal arteries are patent bilaterally. No significant stenosis, dissection or aneurysm. Accessory artery to the lower pole of the right kidney. IMA: Patent without evidence of aneurysm, dissection, vasculitis or significant stenosis. Inflow: Patent without evidence of aneurysm, dissection, vasculitis or significant stenosis. Veins: No obvious venous abnormality within the limitations of this arterial phase study. Review of the MIP images confirms the above findings. NON-VASCULAR Hepatobiliary: Unchanged hepatic contour. No discrete hepatic lesion. High attenuation material now present within the gallbladder lumen consistent with vicarious excretion of contrast. Pancreas: Unremarkable. No pancreatic ductal dilatation or surrounding inflammatory changes. Spleen: Normal in size without focal abnormality. Adrenals/Urinary Tract: Adrenal glands are unremarkable. Kidneys are normal, without renal calculi, focal lesion, or hydronephrosis. Bladder is unremarkable. Stomach/Bowel: Colonic diverticular disease without CT evidence of active inflammation. No focal bowel wall thickening or evidence of obstruction. Lymphatic: No suspicious lymphadenopathy. Reproductive: Surgical changes of prior hysterectomy. No adnexal mass. Other: No abdominal wall hernia or abnormality. No abdominopelvic ascites. Musculoskeletal: No acute or significant osseous findings. Review of the MIP images confirms the above findings. IMPRESSION: 1. Progressive acute intramural hematoma/aortic dissection arising from a penetrating atherosclerotic ulcer at the 9 o'clock position of the proximal descending thoracic aorta. The degree of intramural hematoma and intra arterial contrast within the media of the transverse and proximal descending thoracic aorta is increasing. The maximal diameter of the descending aorta at the site of intimal disruption is now 4.5 cm compared to 4.1 cm on 04/06/2019. Essentially stable  proximal and distal propagation. 2. Mild fusiform aneurysmal dilation of the ascending thoracic aorta with a maximal diameter of 4.3 cm. This may be due to underlying aortic stenosis given the thickened and slightly calcified appearance of the aortic valve. 3. Interval development of moderate left and mild right layering pleural effusions with associated bilateral lower lobe atelectasis. 4. Vicarious excretion of contrast material within the gallbladder. 5. Additional ancillary findings as above without significant interval change compared to recent prior imaging. These results were called by telephone at the time of interpretation on 04/09/2019 at 9:47 am to provider [facility] , who verbally acknowledged these results. Signed, Criselda Peaches, MD, [facility] Radiology Electronically Signed   By: Jacqulynn Cadet M.D.   On: 04/09/2019 09:52   CT Angio Chest/Abd/Pel for Dissection W and/or Wo Contrast  Result Date: 04/06/2019 CLINICAL DATA:  Chest pain. EXAM: CT ANGIOGRAPHY CHEST, ABDOMEN AND PELVIS TECHNIQUE: Multidetector CT imaging through the chest, abdomen and pelvis was performed using the standard protocol during bolus administration of intravenous contrast. Multiplanar reconstructed images and MIPs were obtained and reviewed to evaluate the vascular anatomy. CONTRAST:  1108mL OMNIPAQUE IOHEXOL 350 MG/ML SOLN COMPARISON:  CT dated September 09, 2017. FINDINGS: CTA CHEST FINDINGS Cardiovascular: Again noted is a thoracic aortic aneurysm involving the ascending aorta measure approximately 4.1 cm. This is stable from prior study. There are new penetrating ulcers at the distal aortic arch beyond the takeoff of the left subclavian artery. There is developing mild acute intramural hematoma which extends superiorly and surrounds the right brachiocephalic artery. The arch vessels remain patent. There is inferior extension  of the acute intramural hematoma to  about the T11-T12 level. There may be a very early dissection flap at the aortic arch (axial series 5, image 35). Mediastinum/Nodes: --No mediastinal or hilar lymphadenopathy. --No axillary lymphadenopathy. --No supraclavicular lymphadenopathy. --Normal thyroid gland. --The esophagus is unremarkable Lungs/Pleura: There is subtle scattered mucous plugging with tree-in-bud opacities in the right upper lobe. There is no large focal infiltrate. No pneumothorax. The trachea is unremarkable. There is pleuroparenchymal scarring at the lung bases. There is a 7 mm airspace opacity in the left upper lobe (axial series 6, image 38). Musculoskeletal: No chest wall abnormality. No acute or significant osseous findings. Review of the MIP images confirms the above findings. CTA ABDOMEN AND PELVIS FINDINGS VASCULAR Aorta: Normal caliber aorta without aneurysm, dissection, vasculitis or significant stenosis. Celiac: There is a high-grade stenosis at the origin of the celiac axis SMA: Patent without evidence of aneurysm, dissection, vasculitis or significant stenosis. Renals: Both renal arteries are patent without evidence of aneurysm, dissection, vasculitis, fibromuscular dysplasia or significant stenosis. IMA: Patent without evidence of aneurysm, dissection, vasculitis or significant stenosis. Inflow: Patent without evidence of aneurysm, dissection, vasculitis or significant stenosis. Veins: No obvious venous abnormality within the limitations of this arterial phase study. Review of the MIP images confirms the above findings. NON-VASCULAR Hepatobiliary: There are peripheral hyperattenuating areas in the anterior segment 4A and B favored to represent a benign process such as THADS. normal gallbladder.There is no biliary ductal dilation. Pancreas: Normal contours without ductal dilatation. No peripancreatic fluid collection. Spleen: No splenic laceration or hematoma. Adrenals/Urinary Tract: --Adrenal glands: No adrenal hemorrhage.  --Right kidney/ureter: No hydronephrosis or perinephric hematoma. --Left kidney/ureter: No hydronephrosis or perinephric hematoma. --Urinary bladder: Unremarkable. Stomach/Bowel: --Stomach/Duodenum: No hiatal hernia or other gastric abnormality. Normal duodenal course and caliber. --Small bowel: No dilatation or inflammation. --Colon: No focal abnormality. --Appendix: Normal. Vascular/Lymphatic: There are atherosclerotic changes throughout the abdominal aorta without evidence for an abdominal aortic aneurysm. --No retroperitoneal lymphadenopathy. --No mesenteric lymphadenopathy. --No pelvic or inguinal lymphadenopathy. Reproductive: Status post hysterectomy. No adnexal mass. Other: No ascites or free air. The abdominal wall is normal. Musculoskeletal. No acute displaced fractures. Review of the MIP images confirms the above findings. IMPRESSION: 1. Findings concerning for an early thoracic aortic dissection or penetrating ulcer resulting in acute Type B intramural hematoma as detailed above. There is some retrograde extension of the acute intramural hematoma into the aortic arch as detailed above. The ascending aorta appears to be spared. 2. Stable size of the ascending thoracic aortic aneurysm measuring approximately 4.1 cm. 3. Scattered bilateral pulmonary opacities measuring up to approximately 7 mm in the upper pole. These are favored to be secondary to an infectious or inflammatory process. As such, an outpatient three-month follow-up CT is recommended for further evaluation. 4. 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 Aortic Atherosclerosis (ICD10-I70.0). These results were called by telephone at the time of interpretation on 04/06/2019 at 8:02 pm to provider PHILLIP STAFFORD , who verbally acknowledged these results. Electronically Signed   By: Constance Holster M.D.   On: 04/06/2019 20:06   [place] EKG SITE RITE  Result Date: 04/09/2019 If Site Rite image not attached, placement could not be confirmed due  to current cardiac rhythm.   Assessment and Plan:   1. Acute aortic syndrome with type B intramural hematoma: Management per vascular surgery.  Reviewed notes.  Tentative plans for stent graft repair. 2. Hypertension: Blood pressure is currently well controlled on esmolol drip and oral amlodipine.  Would  continue the same for now and convert to an oral beta-blocker postoperatively. 3. Preoperative cardiovascular assessment: The patient appears to be in good condition for 86 years old.  She is on chronic immunosuppressive therapy with low-dose prednisone and also methotrexate for treatment of rheumatoid arthritis and/or polymyalgia rheumatica which increases her surgical risk.  From a pure cardiac perspective, the patient has no anginal symptoms.  She has had no documented arrhythmia and her EKG has no acute ischemic changes.  Her echocardiogram is also reassuring as it demonstrates normal LV systolic function with no regional wall motion abnormalities.  There is mild aortic stenosis present and this should not impact her cardiac risk of surgery at all.  She can proceed with aortic repair/stent grafting without further cardiac testing. 4. Aortic stenosis, mild: clinical follow-up. Repeat echo one year.  For questions or updates, please contact [place] Please consult [email or web address] for contact info under   Signed, [name], MD  04/09/2019 12:14 PM

## 2019-04-09 NOTE — Progress Notes (Signed)
Peripherally Inserted Central Catheter/Midline Placement  The IV Nurse has discussed with the patient and/or persons authorized to consent for the patient, the purpose of this procedure and the potential benefits and risks involved with this procedure.  The benefits include less needle sticks, lab draws from the catheter, and the patient may be discharged home with the catheter. Risks include, but not limited to, infection, bleeding, blood clot (thrombus formation), and puncture of an artery; nerve damage and irregular heartbeat and possibility to perform a PICC exchange if needed/ordered by physician.  Alternatives to this procedure were also discussed.  Bard Power PICC patient education guide, fact sheet on infection prevention and patient information card has been provided to patient /or left at bedside.    PICC/Midline Placement Documentation  PICC Double Lumen [id number] PICC Right Basilic 37 cm 0 cm (Active)  Indication for Insertion or Continuance of Line Poor Vasculature-patient has had multiple peripheral attempts or PIVs lasting less than 24 hours 04/09/19 1214  Exposed Catheter (cm) 0 cm 04/09/19 1214  Site Assessment Clean;Dry;Intact 04/09/19 1214  Lumen #1 Status Flushed;Blood return noted;Saline locked 04/09/19 1214  Lumen #2 Status Flushed;Blood return noted;Saline locked 04/09/19 1214  Dressing Type Transparent 04/09/19 1214  Dressing Status Clean;Dry;Intact;Antimicrobial disc in place 04/09/19 1214  Dressing Change Due 04/16/19 04/09/19 1214       Scotty Court 04/09/2019, 12:16 PM

## 2019-04-09 NOTE — Progress Notes (Addendum)
NAME:  Gloria [name], MRN:  [id number], DOB:  1925/07/13, LOS: 2 ADMISSION DATE:  04/07/2019, CONSULTATION DATE:  04/07/2019 REFERRING MD:  Joni Fears MD, CHIEF COMPLAINT:  Chest pain    Brief History   Patient is a 85yo female with PMHx of RA, GERD, HTN and thoracic aortic aneurysm who presented to [facility] on 04/06/2019 with constant chest pain radiating to back. CTA with early thoracic aortic dissection and penetrating ulcer resulting in acute Type B intramural hematoma. Pt transferred to [facility] ICU for vascular surgery consultation and aggressive BP management.   History of present illness   [name] is a 86 year old female with PMHx of RA on methotrexate, GERD, HTN and thoracic aortic aneurysm (CTA in 08/2016 and 09/2017 with stable fusiform aneurysmal dilation of ascending thoracic aorta w/diameter of 4.1cm) who presented with constant chest pain radiating to back, 4/10 in severity. It started around 1700 on day of admission and felt heavy like she needed to belch per EDP documentation. She denied shortness of breath.  With EMS, she received aspirin and nitro spray. Evaluated with CXR and CTA which showed early thoracic aortic dissection or penetrating ulcer resulting in acute Type B intramural hematoma. EDP discussed with vascular surgery, Dr. Trula Slade who recommended ICU admission.   Past Medical History  Hypothyroidism GERD Celiac disease Rheumatoid arthritis Polymyalgia rheumatica Osteopenia Anemia Aneurysm of ascending aorta Hypertension Peripheral vascular disease   Significant Hospital Events   1/2 Transferred from [facility]   Consults:  Vascular surgery 1/1  Procedures:  None   Significant Diagnostic Tests:  1/1 CT Angio Chest/Abd/Pelvis: early thoracic aortic dissection or penetrating ulcer resulting in acute type B intramural hematoma. Some retrograde extension of acute intramural hematoma into the aortic arch. Ascending aorta spared.  Stable size of ascending thoracic  aortic aneurysm measuring 4.1cm Scattered bilateral pulmonary opacities measuring up to ~34mm in upper pole. Favored to be secondary to infectious or inflammatory process. 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 Patient reports improvement in symptoms and sleeping better over night.   Objective   Blood pressure 105/86, pulse 65, temperature 100 F (37.8 C), temperature source Oral, resp. rate 16, height 5\' 7"  (1.702 m), weight 67.7 kg, SpO2 99 %.        Intake/Output Summary (Last 24 hours) at 04/09/2019 0728 Last data filed at 04/09/2019 0700 Gross per 24 hour  Intake 1276 ml  Output 2275 ml  Net -999 ml   Filed Weights   04/07/19 0500 04/08/19 0500 04/09/19 0354  Weight: 68.5 kg 69.5 kg 67.7 kg    Examination: Physical Exam  Constitutional: She is oriented to person, place, and time and well-developed, well-nourished, and in no distress.  HENT:  Head: Normocephalic and atraumatic.  Eyes: Conjunctivae and EOM are normal.  Cardiovascular: Normal rate, regular rhythm and intact distal pulses.  Murmur heard. Systolic murmur at left 2nd ICS  Pulmonary/Chest: Effort normal and breath sounds normal. No respiratory distress. She has no wheezes. She has no rales.  Musculoskeletal:        General: Normal range of motion.     Cervical back: Normal range of motion and neck supple.  Neurological: She is alert and oriented to person, place, and  time.  Skin: Skin is warm and dry.   Labs   CBC: Recent Labs  Lab 04/06/19 1813 04/07/19 0219 04/08/19 0236  WBC 8.4 15.8* 13.0*  NEUTROABS 5.6  --   --   HGB 12.0 11.5* 11.0*  HCT 37.9 34.9* 33.4*  MCV 95.7 94.8 94.6  PLT 164 148* 128*    Basic Metabolic Panel: Recent Labs    Lab 04/06/19 1813 04/07/19 0219 04/07/19 0730 04/08/19 0236  NA 141  --  140 135  K 3.4*  --  4.0 3.5  CL 107  --  111 107  CO2 26  --  21* 21*  GLUCOSE 110*  --  118* 130*  BUN 16  --  14 13  CREATININE 0.61 0.66 0.60 0.67  CALCIUM 8.9  --  8.3* 8.1*  MG  --   --  2.4  --    GFR: Estimated Creatinine Clearance: 44.5 mL/min (by C-G formula based on SCr of 0.67 mg/dL). Recent Labs  Lab 04/06/19 1813 04/07/19 0219 04/08/19 0236  PROCALCITON  --  <0.10  --   WBC 8.4 15.8* 13.0*    Liver Function Tests: Recent Labs  Lab 04/06/19 1813  AST 24  ALT 16  ALKPHOS 62  BILITOT 0.7  PROT 7.2  ALBUMIN 4.1   Recent Labs  Lab 04/06/19 1813  LIPASE 27   No results for input(s): AMMONIA in the last 168 hours.  ABG No results found for: PHART, PCO2ART, PO2ART, HCO3, TCO2, ACIDBASEDEF, O2SAT   Coagulation Profile: No results for input(s): INR, PROTIME in the last 168 hours.  Cardiac Enzymes: No results for input(s): CKTOTAL, CKMB, CKMBINDEX, TROPONINI in the last 168 hours.  HbA1C: No results found for: HGBA1C  CBG: No results for input(s): GLUCAP in the last 168 hours.  Review of Systems:   Negative except as stated in HPI.   Resolved Hospital Problem list     Assessment & Plan:  Early thoracic aortic dissection or penetrating ulcer resulting in acute Type B intramural hematoma (Acute aortic syndrome):  - chest pain with radiation to back on presentation, currently only present with cough - imaging with retrograde extension of acute intramural hematoma into aortic arch - patient may be possible candidate for thoracic stent graft P:  Vascular on board, appreciate their recommendations  Continue aggressive BP control w/Esmolol gtt and amlodipine  Goal SBP 100-120 mmHg 2D Echo today and cardiology consult  Patient to be evaluated by Dr. Trula Slade today for possible thoracic stent graft, appreciate recommendations: CTA today to assess for any change in intramural  hemorrhage  Follow up CT early next week per vascular   Hypertension: P: Continue to titrate esmolol gtt and amlodipine for goal SBP 100-191mmHg  Hypothyroidism:  P:  Continue synthroid 69mcg daily  Rheumatoid arthritis:  P: Continue methotrexate, folic acid and prednisone    Best practice:  Diet: heart healthy  Pain/Anxiety/Delirium protocol (if indicated): morphine and oxycodone  VAP protocol (if indicated): none DVT prophylaxis: heparin SQ q8h GI prophylaxis: Protonix Glucose control: none Mobility: activity as tolerated Code Status: FULL Family Communication: will call family with updates today  Disposition: Continue ICU level care   Past Medical History  She,  has a past medical history of Arthritis, Collagen vascular disease ([place]), Heartburn, and Hypertension.   Surgical History    Past Surgical History:  Procedure Laterality Date  . ABDOMINAL HYSTERECTOMY    . BREAST CYST EXCISION       Social History  reports that she has never smoked. She has never used smokeless tobacco. She reports that she does not drink alcohol.   Family History   Her family history is negative for Kidney disease and Bladder Cancer.   Allergies Allergies  Allergen Reactions  . Codeine Nausea Only  . Ibandronate Sodium   . Ibandronate Sodium  [Ibandronic Acid] Other (See Comments)    Gi upset  . Risedronate Other (See Comments)    Gi upset  . Risedronate Sodium   . Sulfa Antibiotics Hives     Home Medications  Prior to Admission medications   Medication Sig Start Date End Date Taking? Authorizing Provider  amLODipine (NORVASC) 5 MG tablet TAKE 1 TABLET BY MOUTH EVERY DAY 10/29/13   [provider]  folic acid (FOLVITE) 1 MG tablet TAKE 1 TABLET EVERY DAY 03/28/14   [provider]  levothyroxine (SYNTHROID, LEVOTHROID) 75 MCG tablet Take 75 mcg by mouth daily.  02/01/14   [provider]  methotrexate (RHEUMATREX) 2.5 MG tablet TAKE 6 TABLETS (15  MG TOTAL) BY MOUTH EVERY 7 (SEVEN) DAYS FOR 168 DAYS 02/23/19   [provider]  Multiple Vitamin (MULTIVITAMIN WITH MINERALS) TABS tablet Take 1 tablet by mouth daily.    [provider]  predniSONE (DELTASONE) 5 MG tablet Take 2.5 mg by mouth daily. 02/23/19   [provider]  traMADol (ULTRAM) 50 MG tablet Take 50 mg by mouth 2 (two) times daily as needed. 02/19/19   [provider]      Critical care time: 30 minutes

## 2019-04-09 NOTE — Progress Notes (Signed)
  Echocardiogram 2D Echocardiogram has been performed.  Kennley Schwandt A Jacquette Canales 04/09/2019, 8:43 AM

## 2019-04-09 NOTE — Progress Notes (Signed)
Spoke with Dr. Trula Slade regarding CT showing increasing hematoma, bedrest orders, and SQ Heparin. Per MD, it is okay for the patient to receive SQ Heparin and out of bed as tolerated. Plan is for the OR on Wednesday.

## 2019-04-10 LAB — COMPREHENSIVE METABOLIC PANEL
ALT: 13 U/L (ref 0–44)
AST: 16 U/L (ref 15–41)
Albumin: 2.4 g/dL — ABNORMAL LOW (ref 3.5–5.0)
Alkaline Phosphatase: 42 U/L (ref 38–126)
Anion gap: 9 (ref 5–15)
BUN: 10 mg/dL (ref 8–23)
CO2: 20 mmol/L — ABNORMAL LOW (ref 22–32)
Calcium: 7.7 mg/dL — ABNORMAL LOW (ref 8.9–10.3)
Chloride: 109 mmol/L (ref 98–111)
Creatinine, Ser: 0.55 mg/dL (ref 0.44–1.00)
GFR calc Af Amer: >60 mL/min (ref >60)
GFR calc non Af Amer: >60 mL/min (ref >60)
Glucose, Bld: 98 mg/dL (ref 70–99)
Potassium: 3 mmol/L — ABNORMAL LOW (ref 3.5–5.1)
Sodium: 138 mmol/L (ref 135–145)
Total Bilirubin: 1.1 mg/dL (ref 0.3–1.2)
Total Protein: 5.3 g/dL — ABNORMAL LOW (ref 6.5–8.1)

## 2019-04-10 LAB — CBC
HCT: 32.4 % — ABNORMAL LOW (ref 36.0–46.0)
Hemoglobin: 10.6 g/dL — ABNORMAL LOW (ref 12.0–15.0)
MCH: 30.7 pg (ref 26.0–34.0)
MCHC: 32.7 g/dL (ref 30.0–36.0)
MCV: 93.9 fL (ref 80.0–100.0)
Platelets: 117 10*3/uL — ABNORMAL LOW (ref 150–400)
RBC: 3.45 MIL/uL — ABNORMAL LOW (ref 3.87–5.11)
RDW: 14.5 % (ref 11.5–15.5)
WBC: 12 10*3/uL — ABNORMAL HIGH (ref 4.0–10.5)
nRBC: 0 % (ref 0.0–0.2)

## 2019-04-10 LAB — BASIC METABOLIC PANEL
Anion gap: 8 (ref 5–15)
BUN: 12 mg/dL (ref 8–23)
CO2: 21 mmol/L — ABNORMAL LOW (ref 22–32)
Calcium: 8 mg/dL — ABNORMAL LOW (ref 8.9–10.3)
Chloride: 106 mmol/L (ref 98–111)
Creatinine, Ser: 0.57 mg/dL (ref 0.44–1.00)
GFR calc Af Amer: >60 mL/min (ref >60)
GFR calc non Af Amer: >60 mL/min (ref >60)
Glucose, Bld: 153 mg/dL — ABNORMAL HIGH (ref 70–99)
Potassium: 3.5 mmol/L (ref 3.5–5.1)
Sodium: 135 mmol/L (ref 135–145)

## 2019-04-10 MED ORDER — POTASSIUM CHLORIDE CRYS ER 20 MEQ PO TBCR
30.0000 meq | EXTENDED_RELEASE_TABLET | ORAL | Status: DC
  Filled 2019-04-10: qty 1

## 2019-04-10 MED ORDER — POTASSIUM CHLORIDE 10 MEQ/50ML IV SOLN
10.0000 meq | INTRAVENOUS | Status: AC
  Administered 2019-04-10 (×4): 10 meq via INTRAVENOUS
  Filled 2019-04-10 (×4): qty 50

## 2019-04-10 MED ORDER — SENNOSIDES-DOCUSATE SODIUM 8.6-50 MG PO TABS: 2.0000 | ORAL_TABLET | Freq: Two times a day (BID) | ORAL | Status: DC | PRN

## 2019-04-10 MED ORDER — CEFAZOLIN SODIUM-DEXTROSE 2-4 GM/100ML-% IV SOLN
2.0000 g | INTRAVENOUS | Status: AC
  Administered 2019-04-11: 11:00:00 2 g via INTRAVENOUS
  Filled 2019-04-10 (×2): qty 100

## 2019-04-10 NOTE — H&P (View-Only) (Signed)
    Subjective  -   Complaining of persistent back pain which is worse with coughing   Physical Exam:  Abdomen is soft Palpable femoral pulses Neurologically intact  Assessment/Plan:   Aortic dissection/intramural hematoma: After reviewing her CT scan from yesterday, there appears to be progression of her disease pathology.  She continues to have persistent back pain despite good blood pressure control.  We discussed that she has failed nonoperative measures and that stent grafting of her descending thoracic aorta is recommended.  I discussed the details of the operation with the patient and then extensively with her family via telephone.  We discussed the risks and benefits of the operation including but not limited to proximal extension of the dissection, aortic rupture, stroke, paralysis, cardiopulmonary issues, and access site complications.  Patient understands these and wishes to proceed.  Her operation has been scheduled for tomorrow morning.  She will be n.p.o. after midnight.  Gloria [name] 04/10/2019 9:48 AM --  Vitals:   04/10/19 0803 04/10/19 0900  BP:  (!) 107/57  Pulse:  76  Resp:  20  Temp: 98.8 F (37.1 C)   SpO2:  97%    Intake/Output Summary (Last 24 hours) at 04/10/2019 0948 Last data filed at 04/10/2019 0900 Gross per 24 hour  Intake 996.58 ml  Output 1325 ml  Net -328.42 ml     Laboratory CBC    Component Value Date/Time   WBC 12.0 (H) 04/10/2019 0323   HGB 10.6 (L) 04/10/2019 0323   HCT 32.4 (L) 04/10/2019 0323   PLT 117 (L) 04/10/2019 0323    BMET    Component Value Date/Time   NA 138 04/10/2019 0323   K 3.0 (L) 04/10/2019 0323   CL 109 04/10/2019 0323   CO2 20 (L) 04/10/2019 0323   GLUCOSE 98 04/10/2019 0323   BUN 10 04/10/2019 0323   CREATININE 0.55 04/10/2019 0323   CALCIUM 7.7 (L) 04/10/2019 0323   GFRNONAA >60 04/10/2019 0323   GFRAA >60 04/10/2019 0323    COAG No results found for: INR, PROTIME No results found for:  PTT  Antibiotics Anti-infectives (From admission, onward)   Start     Dose/Rate Route Frequency Ordered Stop   04/11/19 0600  ceFAZolin (ANCEF) IVPB 2g/100 mL premix     2 g 200 mL/hr over 30 Minutes Intravenous To Short Stay 04/10/19 0836 04/12/19 0600       V. Leia Alf, M.D., [facility] [place] Office: [phone] Pager:  [phone]

## 2019-04-10 NOTE — Progress Notes (Signed)
[facility] ADULT ICU REPLACEMENT PROTOCOL FOR AM LAB REPLACEMENT ONLY  The patient does apply for the [facility] Adult ICU Electrolyte Replacment Protocol based on the criteria listed below:   1. Is GFR >/= 40 ml/min? Yes.    Patient's GFR today is >60 2. Is urine output >/= 0.5 ml/kg/hr for the last 6 hours? Yes.   Patient's UOP is 1.47 ml/kg/hr 3. Is BUN < 60 mg/dL? Yes.    Patient's BUN today is 10 4. Abnormal electrolyte(s): K+ 3.0 5. Ordered repletion with: protocol 6. If a panic level lab has been reported, has the CCM MD in charge been notified? Yes.  .   Physician:  Dr.Stretch  Carlisle Beers 04/10/2019 5:52 AM

## 2019-04-10 NOTE — Progress Notes (Signed)
    Subjective  -   Complaining of persistent back pain which is worse with coughing   Physical Exam:  Abdomen is soft Palpable femoral pulses Neurologically intact  Assessment/Plan:   Aortic dissection/intramural hematoma: After reviewing her CT scan from yesterday, there appears to be progression of her disease pathology.  She continues to have persistent back pain despite good blood pressure control.  We discussed that she has failed nonoperative measures and that stent grafting of her descending thoracic aorta is recommended.  I discussed the details of the operation with the patient and then extensively with her family via telephone.  We discussed the risks and benefits of the operation including but not limited to proximal extension of the dissection, aortic rupture, stroke, paralysis, cardiopulmonary issues, and access site complications.  Patient understands these and wishes to proceed.  Her operation has been scheduled for tomorrow morning.  She will be n.p.o. after midnight.  Wells Brabham 04/10/2019 9:48 AM --  Vitals:   04/10/19 0803 04/10/19 0900  BP:  (!) 107/57  Pulse:  76  Resp:  20  Temp: 98.8 F (37.1 C)   SpO2:  97%    Intake/Output Summary (Last 24 hours) at 04/10/2019 0948 Last data filed at 04/10/2019 0900 Gross per 24 hour  Intake 996.58 ml  Output 1325 ml  Net -328.42 ml     Laboratory CBC    Component Value Date/Time   WBC 12.0 (H) 04/10/2019 0323   HGB 10.6 (L) 04/10/2019 0323   HCT 32.4 (L) 04/10/2019 0323   PLT 117 (L) 04/10/2019 0323    BMET    Component Value Date/Time   NA 138 04/10/2019 0323   K 3.0 (L) 04/10/2019 0323   CL 109 04/10/2019 0323   CO2 20 (L) 04/10/2019 0323   GLUCOSE 98 04/10/2019 0323   BUN 10 04/10/2019 0323   CREATININE 0.55 04/10/2019 0323   CALCIUM 7.7 (L) 04/10/2019 0323   GFRNONAA >60 04/10/2019 0323   GFRAA >60 04/10/2019 0323    COAG No results found for: INR, PROTIME No results found for: PTT   Antibiotics Anti-infectives (From admission, onward)   Start     Dose/Rate Route Frequency Ordered Stop   04/11/19 0600  ceFAZolin (ANCEF) IVPB 2g/100 mL premix     2 g 200 mL/hr over 30 Minutes Intravenous To Short Stay 04/10/19 0836 04/12/19 0600       V. Leia Alf, M.D., [facility] [place] Office: [phone] Pager:  [phone]

## 2019-04-10 NOTE — Progress Notes (Signed)
NAME:  Gloria [name], MRN:  [id number], DOB:  1925/12/04, LOS: 3 ADMISSION DATE:  04/07/2019, CONSULTATION DATE:  04/07/2019 REFERRING MD:  Joni Fears MD, CHIEF COMPLAINT:  Chest pain    Brief History   Patient is a 86yo female with PMHx of RA, GERD, HTN and thoracic aortic aneurysm who presented to [facility] on 04/06/2019 with constant chest pain radiating to back. CTA with early thoracic aortic dissection and penetrating ulcer resulting in acute Type B intramural hematoma. [facility] for aggressive BP management and vascular surgery consultation.   History of present illness   Gloria [name] is a 86 year old female with PMHx of RA on methotrexate, GERD, HTN and thoracic aortic aneurysm (CTA in 08/2016 and 09/2017 with stable fusiform aneurysmal dilation of ascending thoracic aorta w/diameter of 4.1cm) who presented with constant chest pain radiating to back, 4/10 in severity. It started around 1700 on day of admission and felt heavy like Gloria [name] needed to belch per EDP documentation. Gloria [name] denied shortness of breath.  With EMS, Gloria [name] received aspirin and nitro spray. Evaluated with CXR and CTA which showed early thoracic aortic dissection or penetrating ulcer resulting in acute Type B intramural hematoma. EDP discussed with vascular surgery, Dr. Trula Slade who recommended ICU admission.   Past Medical History  Hypothyroidism GERD Celiac disease Rheumatoid arthritis Polymyalgia rheumatica Osteopenia Anemia Aneurysm of ascending aorta Hypertension Peripheral vascular disease   Significant Hospital Events   1/2 Transferred from [facility]   Consults:  Vascular surgery 1/1  Procedures:  None   Significant Diagnostic Tests:  1/1 CT Angio Chest/Abd/Pelvis: early thoracic aortic dissection or penetrating ulcer resulting in acute type B intramural hematoma. Some retrograde extension of acute intramural hematoma into the aortic arch. Ascending aorta spared.  Stable size of ascending thoracic  aortic aneurysm measuring 4.1cm Scattered bilateral pulmonary opacities measuring up to ~8mm in upper pole. Favored to be secondary to infectious or inflammatory process. 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 Degree of intramural hematoma and intraarterial contrast within media of the transverse and proximal descending thoracic aorta is increasing. The maximal diameter of descending aorta at site of intimal disruption is now 4.5cm compared to 4.1cm on 1/1. Essentially stable proximal and distal propagation.  Mild fusiform aneurysmal dilation of the ascending thoracic aorta with a maximal diameter of 4.3 cm. This may be due to underlying aortic stenosis given the thickened and slightly calcified appearance of the aortic valve. Interval development of moderate left and mild right layering pleural effusions with associated bilateral lower lobe atelectasis.  1/4 Echo: LVEF 60-65%; normal RV and LV function w/o increase in ventricular wall thickness. LA severely dilated, RA mildly dilated; mitral valve normal in structure w/o evidence of stenosis and mild regurgitation; tricuspid valve normal in structure; aortic and pulmonic valves normal in structure w/o regurgitation. Aneurysm of the ascending aorta with aortic dilatation noted. Moderate dilatation of ascending aorta (60mm).   1/4 vascular ABI:  Right - No evidence of significant right lower extremity arterial disease Left - Mild left lower extremity arterial disease   Micro Data:  1/1 COVID > Negative 1/1 Influenza A > Negative 1/1 Influenza B > Negative   Antimicrobials:  None   Interim history/subjective:  Overnight, no acute events reported. Patient reports improvement  in  symptoms. Gloria [name] has minimal back pain and chest pain only when coughing. Gloria [name] notes that Gloria [name] is very hungry this morning.   Objective   Blood pressure 116/70, pulse 74, temperature 99.3 F (37.4 C), temperature source Oral, resp. rate 16, height 5\' 7"  (1.702 m), weight 70.6 kg, SpO2 97 %.        Intake/Output Summary (Last 24 hours) at 04/10/2019 0734 Last data filed at 04/10/2019 0700 Gross per 24 hour  Intake 1096.84 ml  Output 1775 ml  Net -678.16 ml   Filed Weights   04/08/19 0500 04/09/19 0354 04/10/19 0500  Weight: 69.5 kg 67.7 kg 70.6 kg    Examination: Physical Exam  Constitutional: Gloria [name] is oriented to person, place, and time and well-developed, well-nourished, and in no distress.  Eyes: Conjunctivae and EOM are normal.  Cardiovascular: Normal rate, regular rhythm and intact distal pulses.  Murmur heard. Systolic murmur at 2nd ICS  Pulmonary/Chest: Effort normal and breath sounds normal. No respiratory distress. Gloria [name] has no wheezes. Gloria [name] has no rales.  Abdominal: Soft. Bowel sounds are normal.  Musculoskeletal:        General: Normal range of motion.     Cervical back: Normal range of motion and neck supple.  Neurological: Gloria [name] is alert and oriented to person, place, and time.  Skin: Skin is warm and dry.   Labs   CBC: Recent Labs  Lab 04/06/19 1813 04/07/19 0219 04/08/19 0236 04/09/19 0718 04/10/19 0323  WBC 8.4 15.8* 13.0* 11.1* 12.0*  NEUTROABS 5.6  --   --   --   --   HGB 12.0 11.5* 11.0* 11.4* 10.6*  HCT 37.9 34.9* 33.4* 33.7* 32.4*  MCV 95.7 94.8 94.6 93.6 93.9  PLT 164 148* 128* 114* 117*    Basic Metabolic Panel: Recent Labs  Lab 04/06/19 1813 04/07/19 0219 04/07/19 0730 04/08/19 0236 04/09/19 0718 04/10/19 0323  NA 141  --  140 135 136 138  K 3.4*  --  4.0 3.5 3.2* 3.0*  CL 107  --  111 107 106 109  CO2 26  --  21* 21* 22 20*  GLUCOSE 110*  --  118* 130* 124* 98  BUN 16  --  14 13 7* 10  CREATININE 0.61 0.66 0.60 0.67 0.61 0.55  CALCIUM 8.9   --  8.3* 8.1* 7.9* 7.7*  MG  --   --  2.4  --   --   --    GFR: Estimated Creatinine Clearance: 44.5 mL/min (by C-G formula based on SCr of 0.55 mg/dL). Recent Labs  Lab 04/07/19 0219 04/08/19 0236 04/09/19 0718 04/10/19 0323  PROCALCITON <0.10  --   --   --   WBC 15.8* 13.0* 11.1* 12.0*    Liver Function Tests: Recent Labs  Lab 04/06/19 1813 04/09/19 0718 04/10/19 0323  AST 24 18 16   ALT 16 14 13   ALKPHOS 62 46 42  BILITOT 0.7 1.1 1.1  PROT 7.2 5.7* 5.3*  ALBUMIN 4.1 2.7* 2.4*   Recent Labs  Lab 04/06/19 1813  LIPASE 27   No results for input(s): AMMONIA in the last 168 hours.  ABG No results found for: PHART, PCO2ART, PO2ART, HCO3, TCO2, ACIDBASEDEF, O2SAT   Coagulation Profile: No results for input(s): INR, PROTIME in the last 168 hours.  Cardiac Enzymes: No results for input(s): CKTOTAL, CKMB, CKMBINDEX, TROPONINI in the last 168 hours.  HbA1C: No results found for: HGBA1C  CBG: No results for input(s): GLUCAP in the last 168 hours.  Review of Systems:   Negative except as stated in HPI.   Resolved Hospital Problem list     Assessment & Plan:  Early thoracic aortic dissection or penetrating ulcer resulting in acute Type B intramural hematoma (Acute aortic syndrome):  - chest pain with radiation to back on presentation, currently only present with cough - CTA with progressively worsening intramural hematoma/aortic dissection (4.1 > 4.5cm) - Echo without LVEF 60-65% wo any structural abnormalities noted  - Pt evaluated by cardiology for preop cardiovascular assessment and recommended to proceed with aortic repair/stent grafting w/o further cardiac testing  P:  Vascular on board, appreciate their recommendations  Aggressive BP control w/Esmolol gtt, cardene gtt, and amlodipine  Goal SBP 100-120 mmHg Endovascular stent graft repair on Wednesday with Dr. Trula Slade  Hypertension: P: Continue to titrate esmolol gtt, nicardipine gtt and amlodipine for  goal SBP 100-190mmHg  Hypothyroidism:  P:  Continue synthroid 43mcg daily  Rheumatoid arthritis:  P: Continue methotrexate, folic acid and prednisone    Best practice:  Diet: heart healthy  Pain/Anxiety/Delirium protocol (if indicated): morphine and oxycodone  VAP protocol (if indicated): none DVT prophylaxis: heparin SQ q8h GI prophylaxis: Protonix Glucose control: none Mobility: activity as tolerated Code Status: FULL Family Communication: will call family with updates  Disposition: Continue ICU level care   Past Medical History  Gloria [name],  has a past medical history of Arthritis, Collagen vascular disease ([place]), Heartburn, and Hypertension.   Surgical History    Past Surgical History:  Procedure Laterality Date  . ABDOMINAL HYSTERECTOMY    . BREAST CYST EXCISION       Social History   reports that Gloria [name] has never smoked. Gloria [name] has never used smokeless tobacco. Gloria [name] reports that Gloria [name] does not drink alcohol.   Family History   Her family history is negative for Kidney disease and Bladder Cancer.   Allergies Allergies  Allergen Reactions  . Codeine Nausea Only  . Ibandronate Sodium   . Ibandronate Sodium  [Ibandronic Acid] Other (See Comments)    Gi upset  . Risedronate Other (See Comments)    Gi upset  . Risedronate Sodium   . Sulfa Antibiotics Hives     Home Medications  Prior to Admission medications   Medication Sig Start Date End Date Taking? Authorizing Provider  amLODipine (NORVASC) 5 MG tablet TAKE 1 TABLET BY MOUTH EVERY DAY 10/29/13   [provider]  folic acid (FOLVITE) 1 MG tablet TAKE 1 TABLET EVERY DAY 03/28/14   [provider]  levothyroxine (SYNTHROID, LEVOTHROID) 75 MCG tablet Take 75 mcg by mouth daily.  02/01/14   [provider]  methotrexate (RHEUMATREX) 2.5 MG tablet TAKE 6 TABLETS (15 MG TOTAL) BY MOUTH EVERY 7 (SEVEN) DAYS FOR 168 DAYS 02/23/19   [provider]  Multiple Vitamin (MULTIVITAMIN WITH MINERALS)  TABS tablet Take 1 tablet by mouth daily.    [provider]  predniSONE (DELTASONE) 5 MG tablet Take 2.5 mg by mouth daily. 02/23/19   [provider]  traMADol (ULTRAM) 50 MG tablet Take 50 mg by mouth 2 (two) times daily as needed. 02/19/19   [provider]      Critical care time:    CRITICAL CARE Performed by: Harvie Heck Internal Medicine, PGY-1 04/10/2019 8:02 AM  Total critical care time: 30 minutes  Critical care time was exclusive of separately billable procedures and treating other patients.  Critical care was necessary to treat or prevent imminent or life-threatening deterioration.  Critical care was  time spent personally by me on the following activities: development of treatment plan with patient and/or surrogate as well as nursing, discussions with consultants, evaluation of patient's response to treatment, examination of patient, obtaining history from patient or surrogate, ordering and performing treatments and interventions, ordering and review of laboratory studies, ordering and review of radiographic studies, pulse oximetry and re-evaluation of patient's condition.

## 2019-04-11 ENCOUNTER — Encounter (HOSPITAL_COMMUNITY)
Admission: AD | Disposition: A | Discharge status: Home / Self Care | Payer: Self-pay | Source: Home / Self Care | Attending: Critical Care Medicine

## 2019-04-11 ENCOUNTER — Inpatient Hospital Stay (HOSPITAL_COMMUNITY): Payer: Medicare PPO

## 2019-04-11 ENCOUNTER — Encounter (HOSPITAL_COMMUNITY): Payer: Self-pay | Admitting: Critical Care Medicine

## 2019-04-11 ENCOUNTER — Inpatient Hospital Stay (HOSPITAL_COMMUNITY): Payer: Medicare PPO | Admitting: Certified Registered Nurse Anesthetist

## 2019-04-11 ENCOUNTER — Encounter: Payer: Self-pay | Admitting: Surgery

## 2019-04-11 DIAGNOSIS — I712 Thoracic aortic aneurysm, without rupture: Secondary | ICD-10-CM

## 2019-04-11 HISTORY — PX: ULTRASOUND GUIDANCE FOR VASCULAR ACCESS: SHX6516

## 2019-04-11 HISTORY — PX: THORACIC AORTIC ENDOVASCULAR STENT GRAFT: SHX6112

## 2019-04-11 LAB — CBC
HCT: 32.4 % — ABNORMAL LOW (ref 36.0–46.0)
HCT: 32.2 % — ABNORMAL LOW (ref 36.0–46.0)
Hemoglobin: 10.8 g/dL — ABNORMAL LOW (ref 12.0–15.0)
Hemoglobin: 10.8 g/dL — ABNORMAL LOW (ref 12.0–15.0)
MCH: 30.9 pg (ref 26.0–34.0)
MCH: 30.9 pg (ref 26.0–34.0)
MCHC: 33.3 g/dL (ref 30.0–36.0)
MCHC: 33.5 g/dL (ref 30.0–36.0)
MCV: 92.6 fL (ref 80.0–100.0)
MCV: 92 fL (ref 80.0–100.0)
Platelets: 137 10*3/uL — ABNORMAL LOW (ref 150–400)
Platelets: 116 10*3/uL — ABNORMAL LOW (ref 150–400)
RBC: 3.5 MIL/uL — ABNORMAL LOW (ref 3.87–5.11)
RBC: 3.5 MIL/uL — ABNORMAL LOW (ref 3.87–5.11)
RDW: 14.6 % (ref 11.5–15.5)
RDW: 14.6 % (ref 11.5–15.5)
WBC: 11.1 10*3/uL — ABNORMAL HIGH (ref 4.0–10.5)
WBC: 15.2 10*3/uL — ABNORMAL HIGH (ref 4.0–10.5)
nRBC: 0 % (ref 0.0–0.2)
nRBC: 0 % (ref 0.0–0.2)

## 2019-04-11 LAB — POCT I-STAT 7, (LYTES, BLD GAS, ICA,H+H)
Acid-base deficit: 3 mmol/L — ABNORMAL HIGH (ref 0.0–2.0)
Bicarbonate: 21.3 mmol/L (ref 20.0–28.0)
Calcium, Ion: 1.14 mmol/L — ABNORMAL LOW (ref 1.15–1.40)
HCT: 37 % (ref 36.0–46.0)
Hemoglobin: 12.6 g/dL (ref 12.0–15.0)
O2 Saturation: 100 %
Patient temperature: 37
Potassium: 3.4 mmol/L — ABNORMAL LOW (ref 3.5–5.1)
Sodium: 137 mmol/L (ref 135–145)
TCO2: 22 mmol/L (ref 22–32)
pCO2 arterial: 34.1 mmHg (ref 32.0–48.0)
pH, Arterial: 7.403 (ref 7.350–7.450)
pO2, Arterial: 170 mmHg — ABNORMAL HIGH (ref 83.0–108.0)

## 2019-04-11 LAB — COMPREHENSIVE METABOLIC PANEL
ALT: 16 U/L (ref 0–44)
AST: 16 U/L (ref 15–41)
Albumin: 2.5 g/dL — ABNORMAL LOW (ref 3.5–5.0)
Alkaline Phosphatase: 47 U/L (ref 38–126)
Anion gap: 9 (ref 5–15)
BUN: 10 mg/dL (ref 8–23)
CO2: 21 mmol/L — ABNORMAL LOW (ref 22–32)
Calcium: 8 mg/dL — ABNORMAL LOW (ref 8.9–10.3)
Chloride: 108 mmol/L (ref 98–111)
Creatinine, Ser: 0.56 mg/dL (ref 0.44–1.00)
GFR calc Af Amer: >60 mL/min (ref >60)
GFR calc non Af Amer: >60 mL/min (ref >60)
Glucose, Bld: 100 mg/dL — ABNORMAL HIGH (ref 70–99)
Potassium: 3.2 mmol/L — ABNORMAL LOW (ref 3.5–5.1)
Sodium: 138 mmol/L (ref 135–145)
Total Bilirubin: 0.9 mg/dL (ref 0.3–1.2)
Total Protein: 5.6 g/dL — ABNORMAL LOW (ref 6.5–8.1)

## 2019-04-11 LAB — POCT ACTIVATED CLOTTING TIME: Activated Clotting Time: 235 seconds

## 2019-04-11 LAB — BASIC METABOLIC PANEL
Anion gap: 8 (ref 5–15)
BUN: 12 mg/dL (ref 8–23)
CO2: 21 mmol/L — ABNORMAL LOW (ref 22–32)
Calcium: 7.6 mg/dL — ABNORMAL LOW (ref 8.9–10.3)
Chloride: 105 mmol/L (ref 98–111)
Creatinine, Ser: 0.55 mg/dL (ref 0.44–1.00)
GFR calc Af Amer: >60 mL/min (ref >60)
GFR calc non Af Amer: >60 mL/min (ref >60)
Glucose, Bld: 128 mg/dL — ABNORMAL HIGH (ref 70–99)
Potassium: 3.6 mmol/L (ref 3.5–5.1)
Sodium: 134 mmol/L — ABNORMAL LOW (ref 135–145)

## 2019-04-11 LAB — PREPARE RBC (CROSSMATCH)

## 2019-04-11 LAB — PROTIME-INR
INR: 1.2 (ref 0.8–1.2)
Prothrombin Time: 14.9 seconds (ref 11.4–15.2)

## 2019-04-11 LAB — APTT: aPTT: 34 seconds (ref 24–36)

## 2019-04-11 LAB — MAGNESIUM: Magnesium: 2.1 mg/dL (ref 1.7–2.4)

## 2019-04-11 LAB — ABO/RH: ABO/RH(D): O POS

## 2019-04-11 SURGERY — INSERTION, ENDOVASCULAR STENT GRAFT, AORTA, THORACIC
Anesthesia: General | Site: Groin | Laterality: Right

## 2019-04-11 MED ORDER — 0.9 % SODIUM CHLORIDE (POUR BTL) OPTIME
TOPICAL | Status: DC | PRN
  Administered 2019-04-11: 12:00:00 1000 mL

## 2019-04-11 MED ORDER — LACTATED RINGERS IV SOLN: INTRAVENOUS | Status: DC | PRN

## 2019-04-11 MED ORDER — FENTANYL CITRATE (PF) 100 MCG/2ML IJ SOLN: 25.0000 ug | INTRAMUSCULAR | Status: DC | PRN

## 2019-04-11 MED ORDER — HEPARIN SODIUM (PORCINE) 5000 UNIT/ML IJ SOLN
5000.0000 [IU] | Freq: Three times a day (TID) | INTRAMUSCULAR | Status: DC
  Administered 2019-04-12: 05:00:00 5000 [IU] via SUBCUTANEOUS
  Filled 2019-04-11: qty 1

## 2019-04-11 MED ORDER — PROTAMINE SULFATE 10 MG/ML IV SOLN
INTRAVENOUS | Status: AC
  Filled 2019-04-11: qty 5

## 2019-04-11 MED ORDER — LIDOCAINE 2% (20 MG/ML) 5 ML SYRINGE
INTRAMUSCULAR | Status: DC | PRN
  Administered 2019-04-11: 100 mg via INTRAVENOUS

## 2019-04-11 MED ORDER — CEFAZOLIN SODIUM-DEXTROSE 2-4 GM/100ML-% IV SOLN
2.0000 g | Freq: Three times a day (TID) | INTRAVENOUS | Status: AC
  Administered 2019-04-11 – 2019-04-12 (×2): 2 g via INTRAVENOUS
  Filled 2019-04-11 (×2): qty 100

## 2019-04-11 MED ORDER — SUGAMMADEX SODIUM 200 MG/2ML IV SOLN
INTRAVENOUS | Status: DC | PRN
  Administered 2019-04-11: 200 mg via INTRAVENOUS

## 2019-04-11 MED ORDER — PROTAMINE SULFATE 10 MG/ML IV SOLN
INTRAVENOUS | Status: DC | PRN
  Administered 2019-04-11: 50 mg via INTRAVENOUS

## 2019-04-11 MED ORDER — SODIUM CHLORIDE 0.9 % IV SOLN
INTRAVENOUS | Status: DC | PRN
  Administered 2019-04-11: 500 mL

## 2019-04-11 MED ORDER — DOCUSATE SODIUM 100 MG PO CAPS
100.0000 mg | ORAL_CAPSULE | Freq: Every day | ORAL | Status: DC
  Filled 2019-04-11: qty 1

## 2019-04-11 MED ORDER — ATORVASTATIN CALCIUM 10 MG PO TABS
20.0000 mg | ORAL_TABLET | Freq: Every day | ORAL | Status: DC
  Administered 2019-04-11: 21:00:00 20 mg via ORAL
  Filled 2019-04-11: qty 2

## 2019-04-11 MED ORDER — FENTANYL CITRATE (PF) 250 MCG/5ML IJ SOLN
INTRAMUSCULAR | Status: AC
  Filled 2019-04-11: qty 5

## 2019-04-11 MED ORDER — HYDRALAZINE HCL 20 MG/ML IJ SOLN: 5.0000 mg | INTRAMUSCULAR | Status: DC | PRN

## 2019-04-11 MED ORDER — PHENYLEPHRINE HCL-NACL 10-0.9 MG/250ML-% IV SOLN
INTRAVENOUS | Status: DC | PRN
  Administered 2019-04-11: 40 ug/min via INTRAVENOUS

## 2019-04-11 MED ORDER — ASPIRIN EC 81 MG PO TBEC
81.0000 mg | DELAYED_RELEASE_TABLET | Freq: Every day | ORAL | Status: DC
  Administered 2019-04-12: 05:00:00 81 mg via ORAL
  Filled 2019-04-11: qty 1

## 2019-04-11 MED ORDER — POTASSIUM CHLORIDE CRYS ER 20 MEQ PO TBCR: 20.0000 meq | EXTENDED_RELEASE_TABLET | Freq: Every day | ORAL | Status: DC | PRN

## 2019-04-11 MED ORDER — PROPOFOL 10 MG/ML IV BOLUS
INTRAVENOUS | Status: AC
  Filled 2019-04-11: qty 20

## 2019-04-11 MED ORDER — DEXAMETHASONE SODIUM PHOSPHATE 10 MG/ML IJ SOLN
INTRAMUSCULAR | Status: DC | PRN
  Administered 2019-04-11: 5 mg via INTRAVENOUS

## 2019-04-11 MED ORDER — FENTANYL CITRATE (PF) 250 MCG/5ML IJ SOLN
INTRAMUSCULAR | Status: DC | PRN
  Administered 2019-04-11: 50 ug via INTRAVENOUS

## 2019-04-11 MED ORDER — IODIXANOL 320 MG/ML IV SOLN
INTRAVENOUS | Status: DC | PRN
  Administered 2019-04-11: 43 mL

## 2019-04-11 MED ORDER — SODIUM CHLORIDE 0.9 % IV SOLN: 500.0000 mL | Freq: Once | INTRAVENOUS | Status: DC | PRN

## 2019-04-11 MED ORDER — POTASSIUM CHLORIDE 10 MEQ/50ML IV SOLN
10.0000 meq | INTRAVENOUS | Status: AC
  Administered 2019-04-11: 08:00:00 10 meq via INTRAVENOUS
  Filled 2019-04-11 (×3): qty 50

## 2019-04-11 MED ORDER — LABETALOL HCL 5 MG/ML IV SOLN: 10.0000 mg | INTRAVENOUS | Status: DC | PRN

## 2019-04-11 MED ORDER — HEPARIN SODIUM (PORCINE) 1000 UNIT/ML IJ SOLN
INTRAMUSCULAR | Status: DC | PRN
  Administered 2019-04-11: 7000 [IU] via INTRAVENOUS

## 2019-04-11 MED ORDER — SODIUM CHLORIDE 0.9 % IV SOLN
INTRAVENOUS | Status: AC
  Filled 2019-04-11: qty 1.2

## 2019-04-11 MED ORDER — LACTATED RINGERS IV SOLN: INTRAVENOUS | Status: DC

## 2019-04-11 MED ORDER — POTASSIUM CHLORIDE 10 MEQ/50ML IV SOLN
10.0000 meq | INTRAVENOUS | Status: DC
  Administered 2019-04-11: 07:00:00 10 meq via INTRAVENOUS
  Filled 2019-04-11 (×2): qty 50

## 2019-04-11 MED ORDER — GUAIFENESIN-DM 100-10 MG/5ML PO SYRP: 15.0000 mL | ORAL_SOLUTION | ORAL | Status: DC | PRN

## 2019-04-11 MED ORDER — POTASSIUM CHLORIDE 10 MEQ/50ML IV SOLN: 10.0000 meq | INTRAVENOUS | Status: DC

## 2019-04-11 MED ORDER — ALUM & MAG HYDROXIDE-SIMETH 200-200-20 MG/5ML PO SUSP
15.0000 mL | ORAL | Status: DC | PRN
  Administered 2019-04-12: 08:00:00 30 mL via ORAL
  Filled 2019-04-11: qty 30

## 2019-04-11 MED ORDER — PHENOL 1.4 % MT LIQD: 1.0000 | OROMUCOSAL | Status: DC | PRN

## 2019-04-11 MED ORDER — PROPOFOL 10 MG/ML IV BOLUS
INTRAVENOUS | Status: DC | PRN
  Administered 2019-04-11: 50 mg via INTRAVENOUS

## 2019-04-11 MED ORDER — ROCURONIUM BROMIDE 10 MG/ML (PF) SYRINGE
PREFILLED_SYRINGE | INTRAVENOUS | Status: DC | PRN
  Administered 2019-04-11: 20 mg via INTRAVENOUS
  Administered 2019-04-11: 40 mg via INTRAVENOUS

## 2019-04-11 MED ORDER — SODIUM CHLORIDE 0.9 % IV SOLN: INTRAVENOUS | Status: DC

## 2019-04-11 MED ORDER — MAGNESIUM SULFATE 2 GM/50ML IV SOLN: 2.0000 g | Freq: Every day | INTRAVENOUS | Status: DC | PRN

## 2019-04-11 MED ORDER — METOPROLOL TARTRATE 5 MG/5ML IV SOLN: 2.0000 mg | INTRAVENOUS | Status: DC | PRN

## 2019-04-11 SURGICAL SUPPLY — 59 items
ADH SKN CLS APL DERMABOND .7 (GAUZE/BANDAGES/DRESSINGS) ×3
CANISTER SUCT 3000ML PPV (MISCELLANEOUS) ×5 IMPLANT
CATH ACCU-VU SIZ PIG 5F 100CM (CATHETERS) IMPLANT
CATH VISIONS PV .035 IVUS (CATHETERS) ×2 IMPLANT
COVER PROBE W GEL 5X96 (DRAPES) ×5 IMPLANT
COVER WAND RF STERILE (DRAPES) ×5 IMPLANT
DERMABOND ADVANCED (GAUZE/BANDAGES/DRESSINGS) ×2
DERMABOND ADVANCED .7 DNX12 (GAUZE/BANDAGES/DRESSINGS) ×3 IMPLANT
DEVICE CLOSURE PERCLS PRGLD 6F (VASCULAR PRODUCTS) IMPLANT
DRAPE ZERO GRAVITY STERILE (DRAPES) ×5 IMPLANT
DRSG TEGADERM 2-3/8X2-3/4 SM (GAUZE/BANDAGES/DRESSINGS) ×5 IMPLANT
DRYSEAL FLEXSHEATH 20FR 33CM (SHEATH) ×2
ELECT CAUTERY BLADE 6.4 (BLADE) ×5 IMPLANT
ELECT REM PT RETURN 9FT ADLT (ELECTROSURGICAL) ×10
ELECTRODE REM PT RTRN 9FT ADLT (ELECTROSURGICAL) ×6 IMPLANT
GLOVE BIOGEL PI IND STRL 6.5 (GLOVE) IMPLANT
GLOVE BIOGEL PI IND STRL 7.5 (GLOVE) ×3 IMPLANT
GLOVE BIOGEL PI INDICATOR 6.5 (GLOVE) ×2
GLOVE BIOGEL PI INDICATOR 7.5 (GLOVE) ×6
GLOVE SURG SS PI 7.5 STRL IVOR (GLOVE) ×5 IMPLANT
GOWN STRL REUS W/ TWL LRG LVL3 (GOWN DISPOSABLE) ×6 IMPLANT
GOWN STRL REUS W/ TWL XL LVL3 (GOWN DISPOSABLE) ×3 IMPLANT
GOWN STRL REUS W/TWL LRG LVL3 (GOWN DISPOSABLE) ×10
GOWN STRL REUS W/TWL XL LVL3 (GOWN DISPOSABLE) ×4
GRAFT BALLN CATH 65CM (STENTS) IMPLANT
HEMOSTAT SNOW SURGICEL 2X4 (HEMOSTASIS) IMPLANT
KIT BASIN OR (CUSTOM PROCEDURE TRAY) ×5 IMPLANT
KIT TURNOVER KIT B (KITS) ×5 IMPLANT
NDL PERC 18GX7CM (NEEDLE) ×3 IMPLANT
NEEDLE PERC 18GX7CM (NEEDLE) ×5 IMPLANT
NS IRRIG 1000ML POUR BTL (IV SOLUTION) ×5 IMPLANT
PACK ENDOVASCULAR (PACKS) ×5 IMPLANT
PAD ARMBOARD 7.5X6 YLW CONV (MISCELLANEOUS) ×10 IMPLANT
PENCIL BUTTON HOLSTER BLD 10FT (ELECTRODE) ×5 IMPLANT
PERCLOSE PROGLIDE 6F (VASCULAR PRODUCTS)
SHEATH AVANTI 11CM 8FR (SHEATH) IMPLANT
SHEATH DRYSEAL FLEX 20FR 33CM (SHEATH) IMPLANT
SHIELD RADPAD SCOOP 12X17 (MISCELLANEOUS) ×10 IMPLANT
SLEEVE ISOL F/PACE RF HD COVER (MISCELLANEOUS) ×2 IMPLANT
STENT GRAFT BALLN CATH 65CM (STENTS)
STENT GRFT THORAC ACS 31X31X10 (Endovascular Graft) ×2 IMPLANT
STENT GRFT THORAC ACS 31X31X20 (Endovascular Graft) ×2 IMPLANT
STOPCOCK MORSE 400PSI 3WAY (MISCELLANEOUS) ×5 IMPLANT
SUT ETHILON 3 0 PS 1 (SUTURE) IMPLANT
SUT PROLENE 5 0 C 1 24 (SUTURE) IMPLANT
SUT VIC AB 2-0 CT1 27 (SUTURE)
SUT VIC AB 2-0 CT1 TAPERPNT 27 (SUTURE) IMPLANT
SUT VIC AB 3-0 SH 27 (SUTURE)
SUT VIC AB 3-0 SH 27X BRD (SUTURE) IMPLANT
SUT VICRYL 4-0 PS2 18IN ABS (SUTURE) IMPLANT
SYR 30ML LL (SYRINGE) IMPLANT
SYR 50ML LL SCALE MARK (SYRINGE) ×5 IMPLANT
TAPE STRIPS DRAPE STRL (GAUZE/BANDAGES/DRESSINGS) ×2 IMPLANT
TOWEL GREEN STERILE (TOWEL DISPOSABLE) ×5 IMPLANT
TRAY FOLEY MTR SLVR 16FR STAT (SET/KITS/TRAYS/PACK) ×5 IMPLANT
TUBING HIGH PRESSURE 120CM (CONNECTOR) ×5 IMPLANT
WIRE BENTSON .035X145CM (WIRE) IMPLANT
WIRE HI TORQ VERSACORE J 260CM (WIRE) ×2 IMPLANT
WIRE STIFF LUNDERQUIST 260CM (WIRE) IMPLANT

## 2019-04-11 NOTE — Op Note (Signed)
Patient name: Gloria [name] MRN: [id number] DOB: 03-23-1926 Sex: female  04/11/2019 Pre-operative Diagnosis: Descending thoracic aortic dissection Post-operative diagnosis:  Same Surgeon:  Annamarie Major Assistants: Risa Grill Procedure:   #1: Endovascular repair of descending thoracic aortic aneurysm without coverage of left subclavian artery   #2: Intravascular ultrasound of the aortic arch and descending thoracic aorta   #3: Aortic arch and thoracic aortogram   #4: Ultrasound-guided access, right femoral artery Anesthesia: General Blood Loss: Minimal Specimens: None  Findings: Intravascular ultrasound identified the dissection flap in the proximal descending thoracic aorta with intramural hematoma extending up into the arch and into the distal descending thoracic aorta.  After stent graft deployment, no residual intimal flap was visualized.  Devices used: Gore CTAG 31 x 20, 31 x 10  Indications: The patient presented to the emergency department 6 days ago with chest and back pain.  CT angiography identified a intramural hematoma within the descending thoracic aorta with extension into the transverse arch.  She was admitted to the ICU for blood pressure and pain control.  With adequate blood pressure control, she had persistent pain.  A repeat CT scan showed progression of the intramural hematoma and evidence of dissection.  Based on her clinical condition and CT scan progression, I felt repair was necessary.  The risks and benefits were extensively discussed with the patient as well as her family via telephone.  Procedure:  The patient was identified in the holding area and taken to [place] [facility]  The patient was then placed supine on the table. general anesthesia was administered.  The patient was prepped and draped in the usual sterile fashion.  A time out was called and antibiotics were administered.  Ultrasound was used to evaluate the right common femoral artery which was patent  without calcification.  A digital ultrasound image was acquired.  A #11 blade was used to make a skin nick.  The right common femoral artery was then cannulated under ultrasound guidance with a micropuncture needle.  A 018 wire was advanced without resistance and a micropuncture sheath was placed.  Next a 035 Bentson wire was inserted.  The subcutaneous tract was dilated with an 8 [place] dilator.  Pro-glide devices were deployed at the 11:00 and 1 o'clock position for preclosure.  An 8 French sheath was placed.  The patient was fully heparinized.  Heparin levels were monitored with ACT measurements.  Next, a pigtail catheter was advanced into the ascending aorta.  A Lunderquist double curve wire was then inserted and the pigtail catheter was removed.  Next the intravascular ultrasound catheter was inserted.  There was an apparent dissection flap beginning just beyond the left subclavian artery extending down into the distal descending thoracic aorta.  Intramural hematoma was also visualized up into the aortic arch.  The catheter was then positioned at the left subclavian artery to get a diameter measurement.  Aortic diameter at this level was 29 mm.  The IVUS catheter was then removed.  A 20 French Gore dry seal sheath was then inserted without difficulty into the abdominal aorta.  I selected a Gore CTAG 31 x 20 device.  It was inserted into the descending thoracic aorta.  A second access in the dry seal sheath was obtained and a pigtail catheter was advanced into the ascending aorta.  An aortic arch angiogram was performed.  No filling defects were identified in the ascending or transverse aorta.  Great vessels were all widely patent.  The subclavian  artery was then marked.  The device was then deployed landing at the level of the left subclavian artery.  Completion angiogram showed excellent position of the device with no obvious filling defects and continued patency of all of the great vessels.  I then reinserted  the IVUS catheter.  The stent appeared to be well opposed and widely patent.  However just distal to the stent for approximately 3 cm there was a mobile intimal flap which appeared to resolve distal to this.  I felt that a distal extension was needed to completely exclude the dissection and so a 31 x 10 Gore CTAG device was then inserted and deployed with approximately 5 cm overlap.  I then reevaluated this area with IVUS.  No further dissection flap was visualized.  A versa core wire was then inserted through the IVUS catheter which was removed.  I pulled the dry seal sheath back into the external iliac artery and shot a retrograde picture to make sure there was no dissection or injury to the iliac vessels which there were not.  I then removed the dry seal sheath and closed the arteriotomy back securing the previously deployed Pro-glide devices.  50 mg of protamine was administered.  Manual pressure was held for additional 5 minutes.  The groin was hemostatic.  Cautery was used in the subcutaneous tissue and Dermabond was placed on the incision.  Hand-held Doppler was used to evaluate the pedal vessels which had brisk Doppler signals.  The patient was then successfully extubated.  She was moving all 4 extremities to command.  She was taken to recovery in stable condition.  There were no immediate complications.   Disposition: To PACU stable.   Theotis Burrow, M.D., [facility] [place] Office: [phone] Pager:  [phone]

## 2019-04-11 NOTE — Anesthesia Procedure Notes (Signed)
Procedure Name: Intubation Date/Time: 04/11/2019 11:00 AM Performed by: Janace Litten, CRNA Pre-anesthesia Checklist: Patient identified, Emergency Drugs available, Suction available and Patient being monitored Patient Re-evaluated:Patient Re-evaluated prior to induction Oxygen Delivery Method: Circle System Utilized Preoxygenation: Pre-oxygenation with 100% oxygen Induction Type: IV induction Ventilation: Mask ventilation without difficulty Laryngoscope Size: Mac and 3 Grade View: Grade I Tube type: Oral Tube size: 7.0 mm Number of attempts: 1 Airway Equipment and Method: Stylet Placement Confirmation: ETT inserted through vocal cords under direct vision,  positive ETCO2 and breath sounds checked- equal and bilateral Secured at: 21 cm Tube secured with: Tape Dental Injury: Teeth and Oropharynx as per pre-operative assessment

## 2019-04-11 NOTE — Anesthesia Procedure Notes (Signed)
Arterial Line Insertion Start/End1/09/2019 10:30 AM Performed by: Janace Litten, CRNA, CRNA  Patient location: Pre-op. Preanesthetic checklist: patient identified, IV checked, risks and benefits discussed, surgical consent and monitors and equipment checked Lidocaine 1% used for infiltration Left, radial was placed Catheter size: 20 G Hand hygiene performed  and maximum sterile barriers used  Allen's test indicative of satisfactory collateral circulation Attempts: 1 Procedure performed without using ultrasound guided technique. Following insertion, dressing applied and Biopatch. Post procedure assessment: normal  Patient tolerated the procedure well with no immediate complications.

## 2019-04-11 NOTE — Anesthesia Postprocedure Evaluation (Signed)
Anesthesia Post Note  Patient: Gloria [name]  Procedure(s) Performed: THORACIC AORTIC ENDOVASCULAR STENT GRAFT insertion (N/A Chest) Intravascular Ultrasound of aorta (N/A Abdomen) Ultrasound Guidance For Vascular Access, right femoral artery (Right Groin)     Patient location during evaluation: PACU Anesthesia Type: General Level of consciousness: awake and alert Pain management: pain level controlled Vital Signs Assessment: post-procedure vital signs reviewed and stable Respiratory status: spontaneous breathing, nonlabored ventilation, respiratory function stable and patient connected to nasal cannula oxygen Cardiovascular status: blood pressure returned to baseline and stable Postop Assessment: no apparent nausea or vomiting Anesthetic complications: no    Last Vitals:  Vitals:   04/11/19 1315 04/11/19 1320  BP:  (!) 121/57  Pulse: 62 63  Resp: 16 14  Temp:  (!) 36.1 C  SpO2: 94% 95%    Last Pain:  Vitals:   04/11/19 1250  TempSrc:   PainSc: 0-No pain                 Montay Vanvoorhis S

## 2019-04-11 NOTE — Progress Notes (Signed)
NAME:  Gloria [name], MRN:  [id number], DOB:  12-27-1925, LOS: 4 ADMISSION DATE:  04/07/2019, CONSULTATION DATE:  04/07/2019 REFERRING MD:  Joni Fears MD, CHIEF COMPLAINT:  Chest pain    Brief History   Patient is a 86yo female with PMHx of RA, GERD, HTN and thoracic aortic aneurysm who presented to [facility] on 04/06/2019 with constant chest pain radiating to back. CTA with early thoracic aortic dissection and penetrating ulcer resulting in acute Type B intramural hematoma. Pt transferred to [facility] ICU for aggressive BP management and vascular surgery consultation.   History of present illness   [name] is a 86 year old female with PMHx of RA on methotrexate, GERD, HTN and thoracic aortic aneurysm (CTA in 08/2016 and 09/2017 with stable fusiform aneurysmal dilation of ascending thoracic aorta w/diameter of 4.1cm) who presented with constant chest pain radiating to back, 4/10 in severity. It started around 1700 on day of admission and felt heavy like she needed to belch per EDP documentation. She denied shortness of breath.  With EMS, she received aspirin and nitro spray. Evaluated with CXR and CTA which showed early thoracic aortic dissection or penetrating ulcer resulting in acute Type B intramural hematoma. EDP discussed with vascular surgery, Dr. Trula Slade who recommended ICU admission.   Past Medical History  Hypothyroidism GERD Celiac disease Rheumatoid arthritis Polymyalgia rheumatica Osteopenia Anemia Aneurysm of ascending aorta Hypertension Peripheral vascular disease   Significant Hospital Events   1/2 Transferred from [facility]   Consults:  Vascular surgery 1/1  Procedures:  None   Significant Diagnostic Tests:  1/1 CT Angio Chest/Abd/Pelvis: early thoracic aortic dissection or penetrating ulcer resulting in acute type B intramural hematoma. Some retrograde extension of acute intramural hematoma into the aortic arch. Ascending aorta spared.  Stable size of ascending thoracic  aortic aneurysm measuring 4.1cm Scattered bilateral pulmonary opacities measuring up to ~31mm in upper pole. Favored to be secondary to infectious or inflammatory process. 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 Degree of intramural hematoma and intraarterial contrast within media of the transverse and proximal descending thoracic aorta is increasing. The maximal diameter of descending aorta at site of intimal disruption is now 4.5cm compared to 4.1cm on 1/1. Essentially stable proximal and distal propagation.  Mild fusiform aneurysmal dilation of the ascending thoracic aorta with a maximal diameter of 4.3 cm. This may be due to underlying aortic stenosis given the thickened and slightly calcified appearance of the aortic valve. Interval development of moderate left and mild right layering pleural effusions with associated bilateral lower lobe atelectasis.  1/4 Echo: LVEF 60-65%; normal RV and LV function w/o increase in ventricular wall thickness. LA severely dilated, RA mildly dilated; mitral valve normal in structure w/o evidence of stenosis and mild regurgitation; tricuspid valve normal in structure; aortic and pulmonic valves normal in structure w/o regurgitation. Aneurysm of the ascending aorta with aortic dilatation noted. Moderate dilatation of ascending aorta (43mm).   1/4 vascular ABI:  Right - No evidence of significant right lower extremity arterial disease Left - Mild left lower extremity arterial disease   Micro Data:  1/1 COVID > Negative 1/1 Influenza A > Negative 1/1 Influenza B > Negative   Antimicrobials:  None   Interim history/subjective:  Overnight, patient noted continued back pain which was  relieved with  pain medication. This morning, patient is resting comfortably and does not have any acute concerns. She is scheduled for stent graft repair this morning with Dr. Trula Slade.   Objective   Blood pressure 126/62, pulse 70, temperature 98 F (36.7 C), temperature source Oral, resp. rate (!) 23, height 5\' 7"  (1.702 m), weight 68.3 kg, SpO2 94 %.        Intake/Output Summary (Last 24 hours) at 04/11/2019 0701 Last data filed at 04/11/2019 0600 Gross per 24 hour  Intake 321.84 ml  Output 2250 ml  Net -1928.16 ml   Filed Weights   04/09/19 0354 04/10/19 0500 04/11/19 0444  Weight: 67.7 kg 70.6 kg 68.3 kg    Examination: Physical Exam  Constitutional: She is oriented to person, place, and time and well-developed, well-nourished, and in no distress.  Cardiovascular: Normal rate, regular rhythm and intact distal pulses.  Murmur heard. Systolic murmur at 2nd ICS  Pulmonary/Chest: Effort normal and breath sounds normal. No respiratory distress. She has no wheezes. She has no rales.  Abdominal: Soft. Bowel sounds are normal. She exhibits no distension. There is no abdominal tenderness.  Neurological: She is alert and oriented to person, place, and time.  Skin: Skin is warm and dry.   Labs   CBC: Recent Labs  Lab 04/06/19 1813 04/07/19 0219 04/08/19 0236 04/09/19 0718 04/10/19 0323 04/11/19 0442  WBC 8.4 15.8* 13.0* 11.1* 12.0* 11.1*  NEUTROABS 5.6  --   --   --   --   --   HGB 12.0 11.5* 11.0* 11.4* 10.6* 10.8*  HCT 37.9 34.9* 33.4* 33.7* 32.4* 32.4*  MCV 95.7 94.8 94.6 93.6 93.9 92.6  PLT 164 148* 128* 114* 117* 137*    Basic Metabolic Panel: Recent Labs  Lab 04/07/19 0730 04/08/19 0236 04/09/19 0718 04/10/19 0323 04/10/19 1545 04/11/19 0442  NA 140 135 136 138 135 138  K 4.0 3.5 3.2* 3.0* 3.5 3.2*  CL 111 107 106 109 106 108  CO2 21* 21* 22 20* 21* 21*  GLUCOSE 118* 130* 124* 98 153* 100*  BUN 14 13 7* 10 12 10   CREATININE 0.60 0.67 0.61 0.55 0.57 0.56  CALCIUM 8.3*  8.1* 7.9* 7.7* 8.0* 8.0*  MG 2.4  --   --   --   --   --    GFR: Estimated Creatinine Clearance: 44.5 mL/min (by C-G formula based on SCr of 0.56 mg/dL). Recent Labs  Lab 04/07/19 0219 04/08/19 0236 04/09/19 0718 04/10/19 0323 04/11/19 0442  PROCALCITON <0.10  --   --   --   --   WBC 15.8* 13.0* 11.1* 12.0* 11.1*    Liver Function Tests: Recent Labs  Lab 04/06/19 1813 04/09/19 0718 04/10/19 0323 04/11/19 0442  AST 24 18 16 16   ALT 16 14 13 16   ALKPHOS 62 46 42 47  BILITOT 0.7 1.1 1.1 0.9  PROT 7.2 5.7* 5.3* 5.6*  ALBUMIN 4.1 2.7* 2.4* 2.5*   Recent Labs  Lab 04/06/19 1813  LIPASE 27   No results for input(s): AMMONIA in the last 168 hours.  ABG No results found for: PHART, PCO2ART, PO2ART, HCO3, TCO2, ACIDBASEDEF, O2SAT   Coagulation Profile: No results for input(s): INR, PROTIME in the last 168 hours.  Cardiac Enzymes: No results for input(s): CKTOTAL, CKMB, CKMBINDEX, TROPONINI in the last 168 hours.  HbA1C: No results found for: HGBA1C  CBG: No results for input(s): GLUCAP in the last 168 hours.  Review of Systems:   Negative  except as stated in HPI.   Resolved Hospital Problem list     Assessment & Plan:  Early thoracic aortic dissection or penetrating ulcer resulting in acute Type B intramural hematoma (Acute aortic syndrome):  - initial presentation with chest pain radiating to back; currently no chest pain but has some back pain overnight  - CTA with progressively worsening intramural hematoma/aortic dissection (4.1 > 4.5cm) - Echo without LVEF 60-65% wo any structural abnormalities noted  P:  Vascular on board, appreciate their recommendations  Aggressive BP control w/goal SBP 100-186mmHg Currently on amlodipine and coreg  Endovascular stent graft repair today with Dr. Trula Slade  Hypokalemia: P:  Replete prn  Hypertension: P: Continue amlodipine and coreg for goal SBP 100-166mmHg  Hypothyroidism:  P:  Continue synthroid 29mcg  daily  Rheumatoid arthritis:  P: Continue methotrexate, folic acid and prednisone    Best practice:  Diet: NPO Pain/Anxiety/Delirium protocol (if indicated): morphine and oxycodone  VAP protocol (if indicated): none DVT prophylaxis: heparin SQ q8h GI prophylaxis: Protonix Glucose control: none Mobility: activity as tolerated Code Status: FULL Family Communication: will call family with updates  Disposition: Continue ICU level care   Past Medical History  She,  has a past medical history of Arthritis, Collagen vascular disease ([place]), Heartburn, and Hypertension.   Surgical History    Past Surgical History:  Procedure Laterality Date  . ABDOMINAL HYSTERECTOMY    . BREAST CYST EXCISION       Social History   reports that she has never smoked. She has never used smokeless tobacco. She reports that she does not drink alcohol.   Family History   Her family history is negative for Kidney disease and Bladder Cancer.   Allergies Allergies  Allergen Reactions  . Codeine Nausea Only  . Ibandronate Sodium   . Ibandronate Sodium  [Ibandronic Acid] Other (See Comments)    Gi upset  . Risedronate Other (See Comments)    Gi upset  . Risedronate Sodium   . Sulfa Antibiotics Hives     Home Medications  Prior to Admission medications   Medication Sig Start Date End Date Taking? Authorizing Provider  amLODipine (NORVASC) 5 MG tablet TAKE 1 TABLET BY MOUTH EVERY DAY 10/29/13   [provider]  folic acid (FOLVITE) 1 MG tablet TAKE 1 TABLET EVERY DAY 03/28/14   [provider]  levothyroxine (SYNTHROID, LEVOTHROID) 75 MCG tablet Take 75 mcg by mouth daily.  02/01/14   [provider]  methotrexate (RHEUMATREX) 2.5 MG tablet TAKE 6 TABLETS (15 MG TOTAL) BY MOUTH EVERY 7 (SEVEN) DAYS FOR 168 DAYS 02/23/19   [provider]  Multiple Vitamin (MULTIVITAMIN WITH MINERALS) TABS tablet Take 1 tablet by mouth daily.    [provider]  predniSONE  (DELTASONE) 5 MG tablet Take 2.5 mg by mouth daily. 02/23/19   [provider]  traMADol (ULTRAM) 50 MG tablet Take 50 mg by mouth 2 (two) times daily as needed. 02/19/19   [provider]      Critical care time:    CRITICAL CARE Performed by: Harvie Heck Internal [facility], PGY-1 04/11/2019 7:01 AM  Total critical care time: 30 minutes  Critical care time was exclusive of separately billable procedures and treating other patients.  Critical care was necessary to treat or prevent imminent or life-threatening deterioration.  Critical care was time spent personally by me on the following activities: development of treatment plan with patient and/or surrogate as well as nursing, discussions with consultants, evaluation of  patient's response to treatment, examination of patient, obtaining history from patient or surrogate, ordering and performing treatments and interventions, ordering and review of laboratory studies, ordering and review of radiographic studies, pulse oximetry and re-evaluation of patient's condition.

## 2019-04-11 NOTE — Transfer of Care (Signed)
Immediate Anesthesia Transfer of Care Note  Patient: [name]  Procedure(s) Performed: THORACIC AORTIC ENDOVASCULAR STENT GRAFT insertion (N/A Chest) Intravascular Ultrasound of aorta (N/A Abdomen) Ultrasound Guidance For Vascular Access, right femoral artery (Right Groin)  Patient Location: PACU  Anesthesia Type:General  Level of Consciousness: awake, alert  and oriented  Airway & Oxygen Therapy: Patient Spontanous Breathing and Patient connected to face mask oxygen  Post-op Assessment: Report given to RN and Post -op Vital signs reviewed and stable  Post vital signs: Reviewed and stable  Last Vitals:  Vitals Value Taken Time  BP 119/47 04/11/19 1247  Temp    Pulse 66 04/11/19 1249  Resp 29 04/11/19 1249  SpO2 96 % 04/11/19 1249  Vitals shown include unvalidated device data.  Last Pain:  Vitals:   04/11/19 1009  TempSrc:   PainSc: 8       Patients Stated Pain Goal: 0 ([id number])  Complications: No apparent anesthesia complications

## 2019-04-11 NOTE — Progress Notes (Signed)
Progress Note  Patient Name: Gloria [name] Date of Encounter: 04/11/2019  Primary Cardiologist: No primary care provider on file.   Subjective   Eager to have surgery done.  Did not sleep well last night.  No chest pain this morning.  Inpatient Medications    Scheduled Meds: . amLODipine  10 mg Oral Daily  . carvedilol  6.25 mg Oral BID WC  . Chlorhexidine Gluconate Cloth  6 each Topical Daily  . folic acid  1 mg Oral Daily  . heparin  5,000 Units Subcutaneous Q8H  . levothyroxine  75 mcg Oral Q0600  . mouth rinse  15 mL Mouth Rinse BID  . [START ON 04/13/2019] methotrexate  15 mg Oral Q Fri  . pantoprazole  40 mg Oral Daily  . predniSONE  2.5 mg Oral Q breakfast  . sodium chloride flush  10-40 mL Intracatheter Q12H   Continuous Infusions: .  ceFAZolin (ANCEF) IV    . potassium chloride     PRN Meds: morphine injection, ondansetron (ZOFRAN) IV, oxyCODONE, senna-docusate, sodium chloride flush   Vital Signs    Vitals:   04/11/19 0500 04/11/19 0600 04/11/19 0700 04/11/19 0800  BP: 128/71 126/62 115/68 122/76  Pulse: 68 70 67 75  Resp: 19 (!) 23 (!) 22 16  Temp:      TempSrc:      SpO2: 92% 94% 95% 98%  Weight:      Height:        Intake/Output Summary (Last 24 hours) at 04/11/2019 0815 Last data filed at 04/11/2019 0600 Gross per 24 hour  Intake 249.91 ml  Output 1950 ml  Net -1700.09 ml   Last 3 Weights 04/11/2019 04/10/2019 04/09/2019  Weight (lbs) 150 lb 9.2 oz 155 lb 10.3 oz 149 lb 4 oz  Weight (kg) 68.3 kg 70.6 kg 67.7 kg      Telemetry    Sinus rhythm, single PVCs, few short ventricular runs 4-5 beats - Personally Reviewed   Physical Exam  Alert, oriented, elderly woman GEN: No acute distress.   Neck: No JVD Cardiac: RRR, 2/6 systolic murmur at the right upper sternal border Respiratory: Clear to auscultation bilaterally. GI: Soft, nontender, non-distended  MS: No edema; No deformity. Neuro:  Nonfocal  Psych: Normal affect   Labs    High  Sensitivity Troponin:   Recent Labs  Lab 04/06/19 1813 04/06/19 2009  TROPONINIHS 5 6      Chemistry Recent Labs  Lab 04/09/19 0718 04/10/19 0323 04/10/19 1545 04/11/19 0442  NA 136 138 135 138  K 3.2* 3.0* 3.5 3.2*  CL 106 109 106 108  CO2 22 20* 21* 21*  GLUCOSE 124* 98 153* 100*  BUN 7* 10 12 10   CREATININE 0.61 0.55 0.57 0.56  CALCIUM 7.9* 7.7* 8.0* 8.0*  PROT 5.7* 5.3*  --  5.6*  ALBUMIN 2.7* 2.4*  --  2.5*  AST 18 16  --  16  ALT 14 13  --  16  ALKPHOS 46 42  --  47  BILITOT 1.1 1.1  --  0.9  GFRNONAA >60 >60 >60 >60  GFRAA >60 >60 >60 >60  ANIONGAP 8 9 8 9      Hematology Recent Labs  Lab 04/09/19 0718 04/10/19 0323 04/11/19 0442  WBC 11.1* 12.0* 11.1*  RBC 3.60* 3.45* 3.50*  HGB 11.4* 10.6* 10.8*  HCT 33.7* 32.4* 32.4*  MCV 93.6 93.9 92.6  MCH 31.7 30.7 30.9  MCHC 33.8 32.7 33.3  RDW 14.6 14.5  14.6  PLT 114* 117* 137*    BNPNo results for input(s): BNP, PROBNP in the last 168 hours.   DDimer No results for input(s): DDIMER in the last 168 hours.   Radiology    VAS [place] ABI WITH/WO TBI  Result Date: 04/10/2019 LOWER EXTREMITY DOPPLER STUDY Indications: PVD. Patient with acute aortic syndrome may require intervention.  Comparison Study: No prior study. Performing Technologist: Maudry Mayhew MHA, RVT, RDCS, RDMS  Examination Guidelines: A complete evaluation includes at minimum, Doppler waveform signals and systolic blood pressure reading at the level of bilateral brachial, anterior tibial, and posterior tibial arteries, when vessel segments are accessible. Bilateral testing is considered an integral part of a complete examination. Photoelectric Plethysmograph (PPG) waveforms and toe systolic pressure readings are included as required and additional duplex testing as needed. Limited examinations for reoccurring indications may be performed as noted.  ABI Findings: +--------+------------------+-----+----------+---------------------------------+ Right    Rt Pressure (mmHg)IndexWaveform  Comment                           +--------+------------------+-----+----------+---------------------------------+ Brachial                                 Unable to evaluate due to PICC                                             location                          +--------+------------------+-----+----------+---------------------------------+ PTA     119               1.04 biphasic                                    +--------+------------------+-----+----------+---------------------------------+ DP      116               1.02 monophasic                                  +--------+------------------+-----+----------+---------------------------------+ +--------+------------------+-----+---------+-------+ Left    Lt Pressure (mmHg)IndexWaveform Comment +--------+------------------+-----+---------+-------+ [id number]                    triphasic        +--------+------------------+-----+---------+-------+ PTA     106               0.93 triphasic        +--------+------------------+-----+---------+-------+ DP      99                0.87 triphasic        +--------+------------------+-----+---------+-------+  Summary: Right: Resting right ankle-brachial index is within normal range. No evidence of significant right lower extremity arterial disease. Left: Resting left ankle-brachial index indicates mild left lower extremity arterial disease.  *See table(s) above for measurements and observations.  Electronically signed by Harold Barban MD on 04/10/2019 at 1:46:05 PM.   Final    ECHOCARDIOGRAM COMPLETE  Result Date: 04/09/2019   ECHOCARDIOGRAM REPORT   Patient Name:   [facility] Date of Exam: 04/09/2019 Medical Rec #:  [id number]         Height:       67.0 in Accession #:    [id number]        Weight:       149.3 lb Date of Birth:  12-14-1925         BSA:          1.79 m Patient Age:    86 years          BP:           108/84 mmHg  Patient Gender: F                 HR:           68 bpm. Exam Location:  Inpatient Procedure: 2D Echo Indications:    Aortic dissection 441.01 / I71.0  History:        Patient has no prior history of Echocardiogram examinations.                 Risk Factors:Hypertension. Thoracic ascending aortic aneurysm.  Sonographer:    Vikki Ports Turrentine Referring Phys: [place]  1. Left ventricular ejection fraction, by visual estimation, is 60 to 65%. The left ventricle has normal function. There is no left ventricular hypertrophy.  2. Left ventricular diastolic parameters are indeterminate.  3. The left ventricle has no regional wall motion abnormalities.  4. Global right ventricle has normal systolic function.The right ventricular size is normal. No increase in right ventricular wall thickness.  5. Left atrial size was severely dilated.  6. Right atrial size was mildly dilated.  7. The mitral valve is normal in structure. Trivial mitral valve regurgitation. No evidence of mitral stenosis.  8. The tricuspid valve is normal in structure.  9. The aortic valve is normal in structure. Aortic valve regurgitation is not visualized. 10. The pulmonic valve was normal in structure. Pulmonic valve regurgitation is not visualized. 11. Aneurysm of the ascending aorta. 12. Aortic dilatation noted. 13. There is moderate dilatation of the ascending aorta measuring 47 mm. 14. Normal pulmonary artery systolic pressure. 15. The atrial septum is grossly normal. FINDINGS  Left Ventricle: Left ventricular ejection fraction, by visual estimation, is 60 to 65%. The left ventricle has normal function. The left ventricle has no regional wall motion abnormalities. There is no left ventricular hypertrophy. Left ventricular diastolic parameters are indeterminate. Right Ventricle: The right ventricular size is normal. No increase in right ventricular wall thickness. Global RV systolic function is has normal systolic function.  The tricuspid regurgitant velocity is 2.47 m/s, and with an assumed right atrial pressure  of 3 mmHg, the estimated right ventricular systolic pressure is normal at 27.4 mmHg. Left Atrium: Left atrial size was severely dilated. Right Atrium: Right atrial size was mildly dilated Pericardium: There is no evidence of pericardial effusion. Mitral Valve: The mitral valve is normal in structure. Trivial mitral valve regurgitation. No evidence of mitral valve stenosis by observation. Tricuspid Valve: The tricuspid valve is normal in structure. Tricuspid valve regurgitation is mild. Aortic Valve: The aortic valve is normal in structure. Aortic valve regurgitation is not visualized. Aortic valve mean gradient measures 17.0 mmHg. Aortic valve peak gradient measures 24.1 mmHg. Aortic valve area, by VTI measures 1.81 cm. Pulmonic Valve: The pulmonic valve was normal in structure. Pulmonic valve regurgitation is not visualized. Pulmonic regurgitation is not visualized. Aorta: Aortic dilatation noted. There is moderate dilatation of the ascending aorta measuring 47 mm. There is an aneurysm involving  the ascending aorta. IAS/Shunts: The atrial septum is grossly normal.  LEFT VENTRICLE PLAX 2D LVIDd:         4.00 cm  Diastology LVIDs:         2.50 cm  LV e' lateral:   8.05 cm/s LV PW:         1.00 cm  LV E/e' lateral: 10.2 LV IVS:        1.00 cm  LV e' medial:    6.31 cm/s LVOT diam:     2.00 cm  LV E/e' medial:  13.0 LV SV:         48 ml LV SV Index:   26.58 LVOT Area:     3.14 cm  RIGHT VENTRICLE RV S prime:     11.30 cm/s TAPSE (M-mode): 2.4 cm LEFT ATRIUM             Index       RIGHT ATRIUM           Index LA diam:        4.90 cm 2.74 cm/m  RA Area:     22.20 cm LA Vol (A2C):   92.6 ml 51.86 ml/m RA Volume:   67.00 ml  37.52 ml/m LA Vol (A4C):   88.5 ml 49.56 ml/m LA Biplane Vol: 92.9 ml 52.02 ml/m  AORTIC VALVE AV Area (Vmax):    1.73 cm AV Area (Vmean):   1.84 cm AV Area (VTI):     1.81 cm AV Vmax:            245.40 cm/s AV Vmean:          175.400 cm/s AV VTI:            0.525 m AV Peak Grad:      24.1 mmHg AV Mean Grad:      17.0 mmHg LVOT Vmax:         135.00 cm/s LVOT Vmean:        103.000 cm/s LVOT VTI:          0.303 m LVOT/AV VTI ratio: 0.58  AORTA Ao Root diam: 2.80 cm MITRAL VALVE                        TRICUSPID VALVE MV Area (PHT): 5.02 cm             TR Peak grad:   24.4 mmHg MV PHT:        43.79 msec           TR Vmax:        248.00 cm/s MV Decel Time: 151 msec MV E velocity: 81.80 cm/s 103 cm/s  SHUNTS MV A velocity: 93.40 cm/s 70.3 cm/s Systemic VTI:  0.30 m MV E/A ratio:  0.88       1.5       Systemic Diam: 2.00 cm  Mertie Moores MD Electronically signed by Mertie Moores MD Signature Date/Time: 04/09/2019/10:56:47 AM    Final    CT Angio Chest/Abd/Pel for Dissection W and/or W/WO  Result Date: 04/09/2019 CLINICAL DATA:  86 year old female with a history of acute intramural hematoma and ongoing clinical status change. Evaluate for progression. EXAM: CT ANGIOGRAPHY CHEST, ABDOMEN AND PELVIS TECHNIQUE: Multidetector CT imaging through the chest, abdomen and pelvis was performed using the standard protocol during bolus administration of intravenous contrast. Multiplanar reconstructed images and MIPs were obtained and reviewed to evaluate the vascular anatomy. CONTRAST:  173mL OMNIPAQUE IOHEXOL 350  MG/ML SOLN COMPARISON:  CTA chest 04/06/2019 FINDINGS: CTA CHEST FINDINGS Cardiovascular: Progressive acute intramural hematoma arising from a penetrating atherosclerotic ulcer at the 9 o'clock position of the proximal descending thoracic aorta (image 45, series 8). This penetrating ulceration results in scattered contrast opacification within the media of the transverse and proximal descending thoracic aorta. The degree of contrast opacification within the aortic media is increased compared to the prior study. The maximal diameter of the aorta at the site of ulceration is now 4.5 cm compared to 4.1 cm previously.  Intramural hematoma extends both superiorly throughout the arch and into the ascending thoracic aorta, and distally to the T11 level. The degree of intramural hematoma surrounding the right brachiocephalic artery is similar. The volume of small intramural hematoma surrounding the ascending thoracic aorta remains stable. The maximum ascending thoracic aortic diameter is 4.3 cm. The aortic root remains normal in caliber. No effacement of the sino-tubular junction. Mildly thickened and calcified aortic valve again noted. The heart remains within normal limits in size. The main pulmonary artery remains normal in size. No significant pericardial effusion. Mediastinum/Nodes: No significant mediastinal hemorrhage. Mildly patulous thoracic esophagus containing fluid. Lungs/Pleura: Interval development of small to moderate bilateral layering pleural effusions slightly larger on the left than the right. There is associated bilateral lower lobe atelectasis. Stable small foci of ground-glass attenuation airspace opacity in both upper lungs compared to the recent prior imaging. No evidence of pulmonary edema or pneumothorax. Musculoskeletal: No acute fracture or aggressive appearing lytic or blastic osseous lesion. Review of the MIP images confirms the above findings. CTA ABDOMEN AND PELVIS FINDINGS VASCULAR Aorta: The abdominal aorta remains unchanged in caliber and appearance. No evidence of extension of intramural hematoma below the diaphragm. Scattered atherosclerotic plaque throughout the aorta. Celiac: High-grade stenosis versus occlusion at the origin of the celiac artery. Mild aneurysmal dilation of the origin of the common hepatic artery up to 0.9 cm. SMA: The origin is widely patent. No aneurysm or dissection. Replaced right hepatic artery. Renals: The main renal arteries are patent bilaterally. No significant stenosis, dissection or aneurysm. Accessory artery to the lower pole of the right kidney. IMA: Patent without  evidence of aneurysm, dissection, vasculitis or significant stenosis. Inflow: Patent without evidence of aneurysm, dissection, vasculitis or significant stenosis. Veins: No obvious venous abnormality within the limitations of this arterial phase study. Review of the MIP images confirms the above findings. NON-VASCULAR Hepatobiliary: Unchanged hepatic contour. No discrete hepatic lesion. High attenuation material now present within the gallbladder lumen consistent with vicarious excretion of contrast. Pancreas: Unremarkable. No pancreatic ductal dilatation or surrounding inflammatory changes. Spleen: Normal in size without focal abnormality. Adrenals/Urinary Tract: Adrenal glands are unremarkable. Kidneys are normal, without renal calculi, focal lesion, or hydronephrosis. Bladder is unremarkable. Stomach/Bowel: Colonic diverticular disease without CT evidence of active inflammation. No focal bowel wall thickening or evidence of obstruction. Lymphatic: No suspicious lymphadenopathy. Reproductive: Surgical changes of prior hysterectomy. No adnexal mass. Other: No abdominal wall hernia or abnormality. No abdominopelvic ascites. Musculoskeletal: No acute or significant osseous findings. Review of the MIP images confirms the above findings. IMPRESSION: 1. Progressive acute intramural hematoma/aortic dissection arising from a penetrating atherosclerotic ulcer at the 9 o'clock position of the proximal descending thoracic aorta. The degree of intramural hematoma and intra arterial contrast within the media of the transverse and proximal descending thoracic aorta is increasing. The maximal diameter of the descending aorta at the site of intimal disruption is now 4.5 cm compared to 4.1 cm on 04/06/2019.  Essentially stable proximal and distal propagation. 2. Mild fusiform aneurysmal dilation of the ascending thoracic aorta with a maximal diameter of 4.3 cm. This may be due to underlying aortic stenosis given the thickened and  slightly calcified appearance of the aortic valve. 3. Interval development of moderate left and mild right layering pleural effusions with associated bilateral lower lobe atelectasis. 4. Vicarious excretion of contrast material within the gallbladder. 5. Additional ancillary findings as above without significant interval change compared to recent prior imaging. These results were called by telephone at the time of interpretation on 04/09/2019 at 9:47 am to provider [facility] , who verbally acknowledged these results. Signed, Criselda Peaches, MD, [facility] Electronically Signed   By: Jacqulynn Cadet M.D.   On: 04/09/2019 09:52   [place] EKG SITE RITE  Result Date: 04/09/2019 If Site Rite image not attached, placement could not be confirmed due to current cardiac rhythm.    Patient Profile     86 y.o. female with acute aortic syndrome/type B intramural hematoma  Assessment & Plan    1.  Acute aortic syndrome with type B intramural hematoma: Patient to go for thoracic stent grafting today. 2.  Hypertension: Blood pressure is well controlled.  She has been transitioned from an esmolol drip to carvedilol.  She continues on oral amlodipine.  Systolic blood pressure remains less than 120 mmHg.  For questions or updates, please contact [place] Please consult [email or web address] for contact info under     Signed, [name], MD  04/11/2019, 8:15 AM

## 2019-04-11 NOTE — Anesthesia Preprocedure Evaluation (Signed)
Anesthesia Evaluation  Patient identified by MRN, date of birth, ID band Patient awake  General Assessment Comment:Type B intramural hematoma of thoracic aorta  Reviewed: Allergy & Precautions, NPO status , Patient's Chart, lab work & pertinent test results  Airway Mallampati: II  TM Distance: >3 FB Neck ROM: Full    Dental no notable dental hx.    Pulmonary neg pulmonary ROS,    Pulmonary exam normal breath sounds clear to auscultation       Cardiovascular hypertension, + Peripheral Vascular Disease  Normal cardiovascular exam Rhythm:Regular Rate:Normal  Left Ventricle: Left ventricular ejection fraction, by visual estimation, is 60 to 65%. The left ventricle has normal function. The left ventricle has no regional wall motion abnormalities. There is no left ventricular hypertrophy. Left ventricular  diastolic parameters are indeterminate.  Right Ventricle: The right ventricular size is normal. No increase in right ventricular wall thickness. Global RV systolic function is has normal systolic function. The tricuspid regurgitant velocity is 2.47 m/s, and with an assumed right atrial pressure  of 3 mmHg, the estimated right ventricular systolic pressure is normal at 27.4 mmHg.  Left Atrium: Left atrial size was severely dilated.  Right Atrium: Right atrial size was mildly dilated  Pericardium: There is no evidence of pericardial effusion.  Mitral Valve: The mitral valve is normal in structure. Trivial mitral valve regurgitation. No evidence of mitral valve stenosis by observation.  Tricuspid Valve: The tricuspid valve is normal in structure. Tricuspid valve regurgitation is mild.  Aortic Valve: The aortic valve is normal in structure. Aortic valve regurgitation is not visualized. Aortic valve mean gradient measures 17.0 mmHg. Aortic valve peak gradient measures 24.1 mmHg. Aortic valve area, by VTI measures 1.81  cm.  Pulmonic Valve: The pulmonic valve was normal in structure. Pulmonic valve regurgitation is not visualized. Pulmonic regurgitation is not visualized.  Aorta: Aortic dilatation noted. There is moderate dilatation of the ascending aorta measuring 47 mm   Neuro/Psych negative neurological ROS  negative psych ROS   GI/Hepatic negative GI ROS, Neg liver ROS,   Endo/Other  Hypothyroidism   Renal/GU negative Renal ROS  negative genitourinary   Musculoskeletal negative musculoskeletal ROS (+)   Abdominal   Peds negative pediatric ROS (+)  Hematology negative hematology ROS (+) anemia ,   Anesthesia Other Findings   Reproductive/Obstetrics negative OB ROS                             Anesthesia Physical Anesthesia Plan  ASA: IV  Anesthesia Plan: General   Post-op Pain Management:    Induction: Intravenous  PONV Risk Score and Plan: 3 and Ondansetron, Dexamethasone and Treatment may vary due to age or medical condition  Airway Management Planned: Oral ETT  Additional Equipment: Arterial line  Intra-op Plan:   Post-operative Plan: Extubation in OR  Informed Consent: I have reviewed the patients History and Physical, chart, labs and discussed the procedure including the risks, benefits and alternatives for the proposed anesthesia with the patient or authorized representative who has indicated his/her understanding and acceptance.     Dental advisory given  Plan Discussed with: CRNA and Surgeon  Anesthesia Plan Comments:         Anesthesia Quick Evaluation

## 2019-04-11 NOTE — Interval H&P Note (Signed)
History and Physical Interval Note:  04/11/2019 9:46 AM  Gloria [name]  has presented today for surgery, with the diagnosis of THORACIC ANEURYSM.  The various methods of treatment have been discussed with the patient and family. After consideration of risks, benefits and other options for treatment, the patient has consented to  Procedure(s): THORACIC AORTIC ENDOVASCULAR STENT GRAFT (N/A) as a surgical intervention.  The patient's history has been reviewed, patient examined, no change in status, stable for surgery.  I have reviewed the patient's chart and labs.  Questions were answered to the patient's satisfaction.     Annamarie Major

## 2019-04-12 ENCOUNTER — Encounter: Payer: Self-pay | Admitting: *Deleted

## 2019-04-12 DIAGNOSIS — I10 Essential (primary) hypertension: Secondary | ICD-10-CM

## 2019-04-12 LAB — COMPREHENSIVE METABOLIC PANEL
ALT: 15 U/L (ref 0–44)
AST: 17 U/L (ref 15–41)
Albumin: 2.4 g/dL — ABNORMAL LOW (ref 3.5–5.0)
Alkaline Phosphatase: 44 U/L (ref 38–126)
Anion gap: 9 (ref 5–15)
BUN: 14 mg/dL (ref 8–23)
CO2: 22 mmol/L (ref 22–32)
Calcium: 8 mg/dL — ABNORMAL LOW (ref 8.9–10.3)
Chloride: 108 mmol/L (ref 98–111)
Creatinine, Ser: 0.63 mg/dL (ref 0.44–1.00)
GFR calc Af Amer: >60 mL/min (ref >60)
GFR calc non Af Amer: >60 mL/min (ref >60)
Glucose, Bld: 142 mg/dL — ABNORMAL HIGH (ref 70–99)
Potassium: 3.5 mmol/L (ref 3.5–5.1)
Sodium: 139 mmol/L (ref 135–145)
Total Bilirubin: 0.4 mg/dL (ref 0.3–1.2)
Total Protein: 5.4 g/dL — ABNORMAL LOW (ref 6.5–8.1)

## 2019-04-12 LAB — CBC
HCT: 31.2 % — ABNORMAL LOW (ref 36.0–46.0)
Hemoglobin: 10.5 g/dL — ABNORMAL LOW (ref 12.0–15.0)
MCH: 31.3 pg (ref 26.0–34.0)
MCHC: 33.7 g/dL (ref 30.0–36.0)
MCV: 92.9 fL (ref 80.0–100.0)
Platelets: 135 10*3/uL — ABNORMAL LOW (ref 150–400)
RBC: 3.36 MIL/uL — ABNORMAL LOW (ref 3.87–5.11)
RDW: 14.6 % (ref 11.5–15.5)
WBC: 11.9 10*3/uL — ABNORMAL HIGH (ref 4.0–10.5)
nRBC: 0 % (ref 0.0–0.2)

## 2019-04-12 MED ORDER — ASPIRIN 81 MG PO TBEC: 81.0000 mg | DELAYED_RELEASE_TABLET | Freq: Every day | ORAL | 0 refills | Status: DC

## 2019-04-12 MED ORDER — SENNOSIDES-DOCUSATE SODIUM 8.6-50 MG PO TABS
2.0000 | ORAL_TABLET | Freq: Two times a day (BID) | ORAL | Status: DC
  Filled 2019-04-12: qty 2

## 2019-04-12 MED ORDER — CARVEDILOL 6.25 MG PO TABS: 6.2500 mg | ORAL_TABLET | Freq: Two times a day (BID) | ORAL | 0 refills | Status: DC

## 2019-04-12 MED ORDER — POTASSIUM CHLORIDE 20 MEQ PO PACK
40.0000 meq | PACK | Freq: Once | ORAL | Status: AC
  Administered 2019-04-12: 11:00:00 40 meq via ORAL
  Filled 2019-04-12: qty 2

## 2019-04-12 MED ORDER — ATORVASTATIN CALCIUM 20 MG PO TABS: 20.0000 mg | ORAL_TABLET | Freq: Every day | ORAL | 0 refills | Status: DC

## 2019-04-12 MED ORDER — AMLODIPINE BESYLATE 10 MG PO TABS: 10.0000 mg | ORAL_TABLET | Freq: Every day | ORAL | 0 refills | Status: DC

## 2019-04-12 NOTE — Progress Notes (Signed)
NAME:  Gloria [name], MRN:  [id number], DOB:  [birth year], LOS: 5 ADMISSION DATE:  04/07/2019, CONSULTATION DATE:  04/07/2019 REFERRING MD:  Joni Fears MD, CHIEF COMPLAINT:  Chest pain    Brief History   Patient is a 86yo female with PMHx of RA, GERD, HTN and thoracic aortic aneurysm who presented to [facility] on 04/06/2019 with constant chest pain radiating to back. CTA with early thoracic aortic dissection and penetrating ulcer resulting in acute Type B intramural hematoma. Pt transferred to [facility] ICU for aggressive BP management and vascular surgery consultation.   History of present illness   [name] is a 86 year old female with PMHx of RA on methotrexate, GERD, HTN and thoracic aortic aneurysm (CTA in 08/2016 and 09/2017 with stable fusiform aneurysmal dilation of ascending thoracic aorta w/diameter of 4.1cm) who presented with constant chest pain radiating to back, 4/10 in severity. It started around 1700 on day of admission and felt heavy like she needed to belch per EDP documentation. She denied shortness of breath.  With EMS, she received aspirin and nitro spray. Evaluated with CXR and CTA which showed early thoracic aortic dissection or penetrating ulcer resulting in acute Type B intramural hematoma. EDP discussed with vascular surgery, Dr. Trula Slade who recommended ICU admission.   Past Medical History  Hypothyroidism GERD Celiac disease Rheumatoid arthritis Polymyalgia rheumatica Osteopenia Anemia Aneurysm of ascending aorta Hypertension Peripheral vascular disease   Significant Hospital Events   1/2 Transferred from [facility]  1/6 Endovascular repair of descending thoracic aortic aneurysm   Consults:  Vascular surgery 1/1  Procedures:  None   Significant Diagnostic Tests:  1/1 CT Angio Chest/Abd/Pelvis: early thoracic aortic dissection or penetrating ulcer resulting in acute type B intramural hematoma. Some retrograde extension of acute intramural hematoma into the aortic  arch. Ascending aorta spared.  Stable size of ascending thoracic aortic aneurysm measuring 4.1cm Scattered bilateral pulmonary opacities measuring up to ~53mm in upper pole. Favored to be secondary to infectious or inflammatory process. 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 Degree of intramural hematoma and intraarterial contrast within media of the transverse and proximal descending thoracic aorta is increasing. The maximal diameter of descending aorta at site of intimal disruption is now 4.5cm compared to 4.1cm on 1/1. Essentially stable proximal and distal propagation.  Mild fusiform aneurysmal dilation of the ascending thoracic aorta with a maximal diameter of 4.3 cm. This may be due to underlying aortic stenosis given the thickened and slightly calcified appearance of the aortic valve. Interval development of moderate left and mild right layering pleural effusions with associated bilateral lower lobe atelectasis.  1/4 Echo: LVEF 60-65%; normal RV and LV function w/o increase in ventricular wall thickness. LA severely dilated, RA mildly dilated; mitral valve normal in structure w/o evidence of stenosis and mild regurgitation; tricuspid valve normal in structure; aortic and pulmonic valves normal in structure w/o regurgitation. Aneurysm of the ascending aorta with aortic dilatation noted. Moderate dilatation of ascending aorta (73mm).   1/4 vascular ABI:  Right - No evidence of significant right lower extremity arterial disease Left - Mild left lower extremity arterial disease   1/6 CXR:  Stent graft placed in the thoracic aorta Left lower lobe atelectasis. PICC tip in the mid SVC.  Micro Data:  1/1 COVID >  Negative 1/1  Influenza A > Negative 1/1 Influenza B > Negative   Antimicrobials:  None   Interim history/subjective:  Overnight, no acute events reported. Patient with continued back discomfort but reports it is improving. This morning, patient is examined at bedside as she is sitting up in the chair and eating breakfast. She denies any current pain and is eager to get home as soon as possible.   Objective   Blood pressure (!) 119/58, pulse 64, temperature 97.6 F (36.4 C), temperature source Oral, resp. rate 17, height 5\' 7"  (1.702 m), weight 70.1 kg, SpO2 94 %.        Intake/Output Summary (Last 24 hours) at 04/12/2019 0700 Last data filed at 04/12/2019 [id number] Gross per 24 hour  Intake 1119.97 ml  Output 1090 ml  Net 29.97 ml   Filed Weights   04/10/19 0500 04/11/19 0444 04/12/19 0400  Weight: 70.6 kg 68.3 kg 70.1 kg    Examination: Physical Exam  Constitutional: She is oriented to person, place, and time and well-developed, well-nourished, and in no distress.  Cardiovascular: Normal rate, regular rhythm and intact distal pulses.  Murmur heard. Systolic murmur at 2nd ICS  Pulmonary/Chest: Effort normal and breath sounds normal. No respiratory distress. She has no wheezes. She has no rales.  Abdominal: Soft. Bowel sounds are normal. She exhibits no distension. There is no abdominal tenderness.  Neurological: She is alert and oriented to person, place, and time.  Skin: Skin is warm and dry.   Labs   CBC: Recent Labs  Lab 04/06/19 1813 04/09/19 0718 04/10/19 0323 04/11/19 0442 04/11/19 1151 04/11/19 1311 04/12/19 0337  WBC 8.4 11.1* 12.0* 11.1*  --  15.2* 11.9*  NEUTROABS 5.6  --   --   --   --   --   --   HGB 12.0 11.4* 10.6* 10.8* 12.6 10.8* 10.5*  HCT 37.9 33.7* 32.4* 32.4* 37.0 32.2* 31.2*  MCV 95.7 93.6 93.9 92.6  --  92.0 92.9  PLT 164 114* 117* 137*  --  116* 135*    Basic Metabolic Panel: Recent Labs  Lab 04/07/19 0730 04/10/19 0323 04/10/19 1545 04/11/19 0442  04/11/19 1151 04/11/19 1311 04/12/19 0337  NA 140 138 135 138 137 134* 139  K 4.0 3.0* 3.5 3.2* 3.4* 3.6 3.5  CL 111 109 106 108  --  105 108  CO2 21* 20* 21* 21*  --  21* 22  GLUCOSE 118* 98 153* 100*  --  128* 142*  BUN 14 10 12 10   --  12 14  CREATININE 0.60 0.55 0.57 0.56  --  0.55 0.63  CALCIUM 8.3* 7.7* 8.0* 8.0*  --  7.6* 8.0*  MG 2.4  --   --   --   --  2.1  --    GFR: Estimated Creatinine Clearance: 44.5 mL/min (by C-G formula based on SCr of 0.63 mg/dL). Recent Labs  Lab 04/07/19 0219 04/10/19 0323 04/11/19 0442 04/11/19 1311 04/12/19 0337  PROCALCITON <0.10  --   --   --   --   WBC 15.8* 12.0* 11.1* 15.2* 11.9*    Liver Function Tests: Recent Labs  Lab 04/06/19 1813 04/09/19 0718 04/10/19 0323 04/11/19 0442 04/12/19 0337  AST 24 18 16 16 17   ALT 16 14 13 16 15   ALKPHOS 62 46 42 47 44  BILITOT 0.7 1.1 1.1 0.9 0.4  PROT 7.2 5.7* 5.3* 5.6* 5.4*  ALBUMIN 4.1 2.7* 2.4* 2.5* 2.4*   Recent Labs  Lab  04/06/19 1813  LIPASE 27   No results for input(s): AMMONIA in the last 168 hours.  ABG    Component Value Date/Time   PHART 7.403 04/11/2019 1151   PCO2ART 34.1 04/11/2019 1151   PO2ART 170.0 (H) 04/11/2019 1151   HCO3 21.3 04/11/2019 1151   TCO2 22 04/11/2019 1151   ACIDBASEDEF 3.0 (H) 04/11/2019 1151   O2SAT 100.0 04/11/2019 1151     Coagulation Profile: Recent Labs  Lab 04/11/19 1311  INR 1.2    Cardiac Enzymes: No results for input(s): CKTOTAL, CKMB, CKMBINDEX, TROPONINI in the last 168 hours.  HbA1C: No results found for: HGBA1C  CBG: No results for input(s): GLUCAP in the last 168 hours.  Review of Systems:   Negative except as stated in HPI.   Resolved Hospital Problem list     Assessment & Plan:  Early thoracic aortic dissection or penetrating ulcer resulting in acute Type B intramural hematoma (Acute aortic syndrome):  - initial presentation with chest pain radiating to back; currently no chest pain and back pain is  significantly improved  - CTA with progressively worsening intramural hematoma/aortic dissection (4.1 > 4.5cm) - Patient underwent endovascular stent graft repair yesterday with Dr. Trula Slade, tolerated well - She is in no acute distress and is hemodynamically stable with SBP 110-130.  - She was evaluated by PT and is safe for discharge to home.  P:  Vascular on board, appreciate their recommendations  Continue BP control with amlodipine and coreg  Patient for potential discharge this afternoon  Patient to follow up with cardiology and Dr. Trula Slade in 4 weeks   Hypokalemia: P:  Replete prn  Hypertension: P: Continue amlodipine and coreg   Hypothyroidism:  P:  Continue synthroid 70mcg daily  Rheumatoid arthritis:  P: Continue methotrexate, folic acid and prednisone    Best practice:  Diet: Regular  Pain/Anxiety/Delirium protocol (if indicated): morphine and oxycodone  VAP protocol (if indicated): none DVT prophylaxis: heparin SQ q8h GI prophylaxis: Protonix Glucose control: none Mobility: activity as tolerated Code Status: FULL Family Communication: will call family with updates  Disposition: Discharge to home   Past Medical History  She,  has a past medical history of Arthritis, Collagen vascular disease ([place]), Heartburn, and Hypertension.   Surgical History    Past Surgical History:  Procedure Laterality Date  . ABDOMINAL HYSTERECTOMY    . BREAST CYST EXCISION       Social History   reports that she has never smoked. She has never used smokeless tobacco. She reports that she does not drink alcohol.   Family History   Her family history is negative for Kidney disease and Bladder Cancer.   Allergies Allergies  Allergen Reactions  . Codeine Nausea Only  . Ibandronate Sodium   . Ibandronate Sodium  [Ibandronic Acid] Other (See Comments)    Gi upset  . Risedronate Other (See Comments)    Gi upset  . Risedronate Sodium   . Sulfa Antibiotics Hives     Home  Medications  Prior to Admission medications   Medication Sig Start Date End Date Taking? Authorizing Provider  amLODipine (NORVASC) 5 MG tablet TAKE 1 TABLET BY MOUTH EVERY DAY 10/29/13   [provider]  folic acid (FOLVITE) 1 MG tablet TAKE 1 TABLET EVERY DAY 03/28/14   [provider]  levothyroxine (SYNTHROID, LEVOTHROID) 75 MCG tablet Take 75 mcg by mouth daily.  02/01/14   [provider]  methotrexate (RHEUMATREX) 2.5 MG tablet TAKE 6 TABLETS (15 MG  TOTAL) BY MOUTH EVERY 7 (SEVEN) DAYS FOR 168 DAYS 02/23/19   [provider]  Multiple Vitamin (MULTIVITAMIN WITH MINERALS) TABS tablet Take 1 tablet by mouth daily.    [provider]  predniSONE (DELTASONE) 5 MG tablet Take 2.5 mg by mouth daily. 02/23/19   [provider]  traMADol (ULTRAM) 50 MG tablet Take 50 mg by mouth 2 (two) times daily as needed. 02/19/19   [provider]      Critical care time:    CRITICAL CARE Performed by: Harvie Heck [facility], PGY-1 04/11/2019 7:00 AM  Total critical care time: 30 minutes  Critical care time was exclusive of separately billable procedures and treating other patients.  Critical care was necessary to treat or prevent imminent or life-threatening deterioration.  Critical care was time spent personally by me on the following activities: development of treatment plan with patient and/or surrogate as well as nursing, discussions with consultants, evaluation of patient's response to treatment, examination of patient, obtaining history from patient or surrogate, ordering and performing treatments and interventions, ordering and review of laboratory studies, ordering and review of radiographic studies, pulse oximetry and re-evaluation of patient's condition.

## 2019-04-12 NOTE — Discharge Instructions (Signed)
Thoracic Aortic Aneurysm  An aneurysm is a bulge in an artery. It happens when blood pushes up against a weakened or damaged artery wall. A thoracic aortic aneurysm is an aneurysm that occurs in the first part of the aorta, between the heart and the diaphragm. The aorta is the main artery of the body. It supplies blood from the heart to the rest of the body. Some aneurysms may not cause symptoms or problems. However, a thoracic aortic aneurysm can cause two serious problems:  It can enlarge and burst (rupture).  It can cause blood to flow between the layers of the wall of the aorta through a tear (aortic dissection). Both of these problems are medical emergencies. They can cause bleeding inside the body and can be life-threatening if they are not diagnosed and treated right away. What are the causes? The exact cause of this condition is not known. What increases the risk? The following factors may make you more likely to develop this condition:  Being 65 years of age or older.  Having a family history of aneurysms.  Using tobacco.  Having any of these conditions: ? Hardening of the arteries caused by the buildup of fat and other substances in the lining of a blood vessel (arteriosclerosis). ? Inflammation of the walls of an artery (arteritis). ? A genetic disease that weakens the body's connective tissue, such as Marfan syndrome. ? An injury or trauma to the aorta. ? High blood pressure (hypertension). ? High cholesterol. ? An infection from bacteria, such as syphilis or staphylococcus, in the wall of the aorta (infectious aortitis). What are the signs or symptoms? Symptoms of this condition vary depending on the size of the aneurysm and how fast it is growing. Most grow slowly and do not cause symptoms. When symptoms do occur, they may include:  Pain in the chest, back, sides, or abdomen. The pain may vary in intensity. Sudden, severe pain may indicate that the aneurysm has ruptured.   Hoarseness.  Cough.  Shortness of breath.  Swallowing problems.  Swelling in the face, arms, or legs.  Fever.  Unexplained weight loss. How is this diagnosed? This condition may be diagnosed with:  An ultrasound.  X-rays.  CT scan.  MRI.  A test to check the arteries for damage or blockages (angiogram). Most unruptured thoracic aortic aneurysms cause no symptoms, so they are often found during exams for other conditions. How is this treated? Treatment for this condition depends on:  The size of the aneurysm.  How fast the aneurysm is growing.  Your age.  Risk factors for rupture. Small aneurysms (2.2 inches, or 5.5 cm, or less) may be managed with:  Medicines to: ? Control blood pressure. ? Manage pain. ? Fight infection.  Regular monitoring. This may include an ultrasound or CT scan every year or every 6 months to see if the aneurysm is getting bigger. Large or fast-growing aneurysms may be treated with surgery. Follow these instructions at home: Eating and drinking   Eat a healthy diet. Your health care provider may recommend that you: ? Lower your salt (sodium) intake. In some people, too much salt can raise blood pressure and increase the risk for thoracic aortic aneurysm. ? Avoid foods that are high in saturated fat and cholesterol, such as red meat and full-fat dairy. ? Eat a diet that is low in sugar. ? Increase your fiber intake by including whole grains, vegetables, and fruits in your diet. Eating these foods may help to lower your   blood pressure.  Do not drink alcohol if your health care provider tells you not to drink.  If you drink alcohol: ? Limit how much you use to:  0-1 drink a day for women.  0-2 drinks a day for men. ? Be aware of how much alcohol is in your drink. In the [place] one drink equals one 12 oz bottle of beer (355 mL), one 5 oz glass of wine (148 mL), or one 1 oz glass of hard liquor (44 mL). Lifestyle  Do not use any  products that contain nicotine or tobacco, such as cigarettes, e-cigarettes, and chewing tobacco. If you need help quitting, ask your health care provider.  Maintain a healthy weight.  Check your blood pressure regularly. Follow your health care provider's instructions on how to keep your blood pressure within normal limits.  Have your blood sugar (glucose) level and cholesterol levels checked regularly. Follow your health care provider's instructions on how to keep levels within normal limits. Activity   Stay physically active and exercise regularly. Talk with your health care provider about how often you should exercise and ask which types of exercise are best for you.  Avoid heavy lifting and activities that take a lot of effort. Ask your health care provider what activities are safe for you. General instructions  Take over-the-counter and prescription medicines only as told by your health care provider.  Talk with your health care provider about regular screenings to see if the aneurysm is getting bigger.  Keep all follow-up visits as told by your health care provider. This is important. Contact a health care provider if you have:  Unexplained weight loss. Get help right away if you have:  Pain in your upper back, neck, or abdomen. This pain may move into your chest and arms.  Trouble swallowing.  A cough or hoarseness.  Shortness of breath. Summary  A thoracic aortic aneurysm is an aneurysm that occurs in the first part of the aorta, between the heart and the diaphragm.  As a thoracic aortic aneurysm becomes larger, it can burst (rupture), or blood can flow between the layers of the wall of the aorta through a tear (aorticdissection). These conditions can be life-threatening if they are not diagnosed and treated right away.  If you have a thoracic aortic aneurysm, its growth will be closely monitored. Surgical repair may be needed for larger or faster-growing aneurysms.  This information is not intended to replace advice given to you by your health care provider. Make sure you discuss any questions you have with your health care provider. Document Revised: 11/08/2017 Document Reviewed: 11/09/2017 Elsevier Patient Education  2020 [employer] Inc.  

## 2019-04-12 NOTE — Progress Notes (Addendum)
Progress Note  Patient Name: Gloria [name] Date of Encounter: 04/12/2019  Primary Cardiologist: No primary care provider on file.   Subjective   Up walking with PT around the nursing station.   Inpatient Medications    Scheduled Meds: . amLODipine  10 mg Oral Daily  . aspirin EC  81 mg Oral Q0600  . atorvastatin  20 mg Oral q1800  . carvedilol  6.25 mg Oral BID WC  . Chlorhexidine Gluconate Cloth  6 each Topical Daily  . docusate sodium  100 mg Oral Daily  . folic acid  1 mg Oral Daily  . heparin  5,000 Units Subcutaneous Q8H  . levothyroxine  75 mcg Oral Q0600  . mouth rinse  15 mL Mouth Rinse BID  . [START ON 04/13/2019] methotrexate  15 mg Oral Q Fri  . pantoprazole  40 mg Oral Daily  . potassium chloride  40 mEq Oral Once  . predniSONE  2.5 mg Oral Q breakfast  . senna-docusate  2 tablet Oral BID  . sodium chloride flush  10-40 mL Intracatheter Q12H   Continuous Infusions: . sodium chloride    . sodium chloride 75 mL/hr at 04/11/19 1500  . magnesium sulfate bolus IVPB     PRN Meds: sodium chloride, alum & mag hydroxide-simeth, guaiFENesin-dextromethorphan, hydrALAZINE, labetalol, magnesium sulfate bolus IVPB, metoprolol tartrate, morphine injection, ondansetron (ZOFRAN) IV, oxyCODONE, phenol, potassium chloride, sodium chloride flush   Vital Signs    Vitals:   04/12/19 0500 04/12/19 0600 04/12/19 0700 04/12/19 0800  BP: (!) 114/58 (!) 119/58 130/72 109/79  Pulse: 65 64 70 68  Resp: 18 17 19 17   Temp:   (!) 97.2 F (36.2 C)   TempSrc:   Oral   SpO2: 93% 94% 96% 96%  Weight:      Height:        Intake/Output Summary (Last 24 hours) at 04/12/2019 0946 Last data filed at 04/12/2019 0730 Gross per 24 hour  Intake 1239.97 ml  Output 1090 ml  Net 149.97 ml   Last 3 Weights 04/12/2019 04/11/2019 04/10/2019  Weight (lbs) 154 lb 8.7 oz 150 lb 9.2 oz 155 lb 10.3 oz  Weight (kg) 70.1 kg 68.3 kg 70.6 kg      Telemetry    SR few episodes of 3 beat NSVT - Personally  Reviewed  ECG    No new tracing  Physical Exam  Pleasant older WF, walking in the hallway GEN: No acute distress.   Neck: No JVD Cardiac: RRR, no murmurs, rubs, or gallops.  Respiratory: Clear to auscultation bilaterally. GI: Soft, nontender, non-distended  MS: No edema; No deformity. Right groin stable.  Neuro:  Nonfocal  Psych: Normal affect   Labs    High Sensitivity Troponin:   Recent Labs  Lab 04/06/19 1813 04/06/19 2009  TROPONINIHS 5 6      Chemistry Recent Labs  Lab 04/10/19 0323 04/11/19 0442 04/11/19 1151 04/11/19 1311 04/12/19 0337  NA 138 138 137 134* 139  K 3.0* 3.2* 3.4* 3.6 3.5  CL 109 108  --  105 108  CO2 20* 21*  --  21* 22  GLUCOSE 98 100*  --  128* 142*  BUN 10 10  --  12 14  CREATININE 0.55 0.56  --  0.55 0.63  CALCIUM 7.7* 8.0*  --  7.6* 8.0*  PROT 5.3* 5.6*  --   --  5.4*  ALBUMIN 2.4* 2.5*  --   --  2.4*  AST 16 16  --   --  17  ALT 13 16  --   --  15  ALKPHOS 42 47  --   --  44  BILITOT 1.1 0.9  --   --  0.4  GFRNONAA >60 >60  --  >60 >60  GFRAA >60 >60  --  >60 >60  ANIONGAP 9 9  --  8 9     Hematology Recent Labs  Lab 04/11/19 0442 04/11/19 1151 04/11/19 1311 04/12/19 0337  WBC 11.1*  --  15.2* 11.9*  RBC 3.50*  --  3.50* 3.36*  HGB 10.8* 12.6 10.8* 10.5*  HCT 32.4* 37.0 32.2* 31.2*  MCV 92.6  --  92.0 92.9  MCH 30.9  --  30.9 31.3  MCHC 33.3  --  33.5 33.7  RDW 14.6  --  14.6 14.6  PLT 137*  --  116* 135*    BNPNo results for input(s): BNP, PROBNP in the last 168 hours.   DDimer No results for input(s): DDIMER in the last 168 hours.   Radiology    DG Chest Port 1 View  Result Date: 04/11/2019 CLINICAL DATA:  Thoracic aneurysm repair EXAM: PORTABLE CHEST 1 VIEW COMPARISON:  04/06/2019 FINDINGS: Interval placement of stent graft in the distal aortic arch and descending thoracic aorta. Left lower lobe consolidation has developed since the prior study. Probable atelectasis. Negative for heart failure or edema.  Right lung clear. Right arm PICC tip in the mid SVC. IMPRESSION: Stent graft placed in the thoracic aorta Left lower lobe atelectasis. PICC tip in the mid SVC. Electronically Signed   By: Franchot Gallo M.D.   On: 04/11/2019 13:30   HYBRID OR IMAGING ([facility] ONLY)  Result Date: 04/11/2019 There is no interpretation for this exam.  This order is for images obtained during a surgical procedure.  Please See "Surgeries" Tab for more information regarding the procedure.   Patient Profile     86 y.o. female  with acute aortic syndrome/type B intramural hematoma  Assessment & Plan    1.  Acute aortic syndrome with type B intramural hematoma: underwent TEVAR with Dr. Trula Slade yesterday. Hemodynamically stable this morning. Just walked in the hallway and did great. Possible DC this afternoon.  2.  Hypertension: Blood pressure is well controlled.  Switched from an esmolol drip to carvedilol.  She continues on oral amlodipine. Blood pressures stable after ambulation with PT.   Will arrange for follow up in the office for blood pressure management.   [facility] will sign off.   Medication Recommendations:  Continue coreg, amlodipine Other recommendations (labs, testing, etc):  none Follow up as an outpatient:  Will arrange f/u   For questions or updates, please contact [place] Please consult [email or web address] for contact info under        Signed, [name], NP  04/12/2019, 9:46 AM    Patient seen, examined. Available data reviewed. Agree with findings, assessment, and plan as outlined by [name], NP.  On my exam today, the patient is alert and oriented, no acute distress.  Otherwise as outlined above.  She is doing remarkably well postoperative day #1 from TEVAR.  Plans for discharge home later today.  Her blood pressure is under excellent control on a combination of carvedilol and amlodipine.  Will arrange cardiology follow-up in the hypertension clinic.  [name], M.D.  04/12/2019 11:16 AM

## 2019-04-12 NOTE — Discharge Summary (Signed)
Name: Gloria [name] MRN: [id number] DOB: [birth year] 86 y.o. PCP: Adin Hector, MD  Date of Admission: 04/07/2019 12:04 AM Date of Discharge: 04/12/2019 Attending Physician: Audria Nine, DO  Discharge Diagnosis: 1. 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 Hypertension 3. Hypokalemia 4. Normocytic anemia 5. Thrombocytopenia    Discharge Medications: Allergies as of 04/12/2019      Reactions   Codeine Nausea Only   Ibandronate Sodium    Ibandronate Sodium  [ibandronic Acid] Other (See Comments)   Gi upset   Risedronate Other (See Comments)   Gi upset   Risedronate Sodium    Sulfa Antibiotics Hives      Medication List    TAKE these medications   amLODipine 10 MG tablet Commonly known as: NORVASC Take 1 tablet (10 mg total) by mouth daily. What changed:   medication strength  how much to take  how to take this  when to take this  additional instructions   aspirin 81 MG EC tablet Take 1 tablet (81 mg total) by mouth daily at 6 (six) AM. Start taking on: April 13, 2019   atorvastatin 20 MG tablet Commonly known as: LIPITOR Take 1 tablet (20 mg total) by mouth daily at 6 PM.   carvedilol 6.25 MG tablet Commonly known as: COREG Take 1 tablet (6.25 mg total) by mouth 2 (two) times daily with a meal.   folic acid 1 MG tablet Commonly known as: FOLVITE TAKE 1 TABLET EVERY DAY   levothyroxine 75 MCG tablet Commonly known as: SYNTHROID Take 75 mcg by mouth daily.   methotrexate 2.5 MG tablet Commonly known as: RHEUMATREX TAKE 6 TABLETS (15 MG TOTAL) BY MOUTH EVERY 7 (SEVEN) DAYS FOR 168 DAYS   multivitamin with minerals Tabs tablet Take 1 tablet by mouth daily.   predniSONE 5 MG tablet Commonly known as: DELTASONE Take 2.5 mg by mouth daily.   traMADol 50 MG tablet Commonly known as: ULTRAM Take 50 mg by mouth 2  (two) times daily as needed.       Disposition and follow-up:   Ms.Gloria [name] Kindle was discharged from [facility] in Stable condition.  At the hospital follow up visit please address:  1.  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 Patient to f/u with Dr. Trula Slade 4 weeks after discharge and with [facility] heart care for BP management.   Hypokalemia: Check BMP  2.  Labs / imaging needed at time of follow-up: BMP, CBC at PCP follow up;  CTA Chest/Abdomen/Pelvis in 4 week follow up with Dr. Trula Slade   3.  Pending labs/ test needing follow-up: none   Follow-up Appointments: Follow-up Information    Serafina Mitchell, MD Follow up in 4 week(s).   Specialties: Vascular Surgery, Cardiology Why: Office will call patient to set up appt (with CTA of chest, abd and pelvis) Contact information: [place] [address] [phone]        Tama High III, MD. Schedule an appointment as soon as possible for a visit in 2 week(s).   Specialty: Internal Medicine Contact information: [place] [address] [place] Hospital Course by problem list: 1. Acute Type B intramural hematoma s/p endovascular stent placement:  Patient with  history of thoracic aortic aneurysm presented to [facility] with chest pain radiating to her back for one day duration. CT Angio significant for early thoracic aortic dissection or penetrating ulcer resulting in acute type B intramural hematoma. Patient was admitted to the [facility] ICU for aggressive blood pressure control. 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 She had endovascular stent placement with Dr. Trula Slade on 1/6 and tolerated it well. She was  observed in the ICU after and remained hemodynamically stable. She was discharged to home and is to follow up with Dr. Trula Slade in 4 weeks for repeat CTA Chest/Abdomen/Pelvis.   2. Hypertension: Patient was placed on esmolol and nicardipine drip for tight blood pressure control in setting of acute type B intramural hematoma. She was subsequently weaned to oral amlodipine and carvedilol with good BP control. She was discharged home with amlodipine and carvedilol and is to follow up with her PCP and in the hypertension clinic.   3. Hypokalemia:  Patient had mild hypokalemia during her admission requiring replacement. She will need monitoring with PCP.   4. Normocytic anemia and thrombocytopenia Likely in setting of acute intramural hematoma. Stable on discharge. Patient will need monitoring of this with PCP.   Discharge Vitals:   BP 109/79   Pulse 68   Temp (!) 97.2 F (36.2 C) (Oral)   Resp 17   Ht 5\' 7"  (1.702 m)   Wt 70.1 kg   SpO2 96%   BMI 24.20 kg/m   Pertinent Labs, Studies, and Procedures:   BMP Latest Ref Rng & Units 04/12/2019 04/11/2019 04/11/2019  Glucose 70 - 99 mg/dL 142(H) 128(H) -  BUN 8 - 23 mg/dL 14 12 -  Creatinine 0.44 - 1.00 mg/dL 0.63 0.55 -  Sodium 135 - 145 mmol/L 139 134(L) 137  Potassium 3.5 - 5.1 mmol/L 3.5 3.6 3.4(L)  Chloride 98 - 111 mmol/L 108 105 -  CO2 22 - 32 mmol/L 22 21(L) -  Calcium 8.9 - 10.3 mg/dL 8.0(L) 7.6(L) -   CBC Latest Ref Rng & Units 04/12/2019 04/11/2019 04/11/2019  WBC 4.0 - 10.5 K/uL 11.9(H) 15.2(H) -  Hemoglobin 12.0 - 15.0 g/dL 10.5(L) 10.8(L) 12.6  Hematocrit 36.0 - 46.0 % 31.2(L) 32.2(L) 37.0  Platelets 150 - 400 K/uL 135(L) 116(L) -   04/06/2019 CXR:  IMPRESSION: No active cardiopulmonary disease.  04/06/2019 CTA CHEST/ABDOMEN/PELVIS:  IMPRESSION: 1. Findings concerning for an early thoracic aortic dissection or penetrating ulcer resulting in acute Type B intramural hematoma as detailed above. There is some retrograde extension of  the acute intramural hematoma into the aortic arch as detailed above. The ascending aorta appears to be spared. 2. Stable size of the ascending thoracic aortic aneurysm measuring approximately 4.1 cm. 3. Scattered bilateral pulmonary opacities measuring up to approximately 7 mm in the upper pole. These are favored to be secondary to an infectious or inflammatory process. As such, an outpatient three-month follow-up CT is recommended for further evaluation. 4. 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  04/09/2019 CTA CHEST/ABDOMEN/PELVIS:  IMPRESSION: 1. Progressive acute intramural hematoma/aortic dissection arising from a penetrating atherosclerotic ulcer at the 9 o'clock position of the proximal descending thoracic aorta. The degree of intramural hematoma and intra arterial contrast within the media of the transverse and proximal descending thoracic aorta is increasing. The maximal diameter of the descending aorta at the site of intimal disruption is now 4.5 cm compared to 4.1 cm on 04/06/2019.  Essentially stable proximal and distal propagation. 2. Mild fusiform aneurysmal dilation of the ascending thoracic aorta with a maximal diameter of 4.3 cm. This may be due to underlying aortic stenosis given the thickened and slightly calcified appearance of the aortic valve. 3. Interval development of moderate left and mild right layering pleural effusions with associated bilateral lower lobe atelectasis. 4. Vicarious excretion of contrast material within the gallbladder. 5. Additional ancillary findings as above without significant interval change compared to recent prior imaging.  04/10/2018  THORACIC AORTIC ENDOVASCULAR STENT GRAFT PLACEMENT:  Findings: Intravascular ultrasound identified the dissection flap in the proximal descending thoracic aorta with intramural hematoma extending up into the arch and into the distal descending thoracic aorta.  After stent graft deployment, no  residual intimal flap was visualized.  04/11/2019 CXR:  IMPRESSION: Stent graft placed in the thoracic aorta Left lower lobe atelectasis. PICC tip in the mid SVC.  Discharge Instructions: Discharge Instructions    Call MD for:  difficulty breathing, headache or visual disturbances   Complete by: As directed    Call MD for:  extreme fatigue   Complete by: As directed    Call MD for:  persistant dizziness or light-headedness   Complete by: As directed    Call MD for:  persistant nausea and vomiting   Complete by: As directed    Call MD for:  redness, tenderness, or signs of infection (pain, swelling, redness, odor or green/yellow discharge around incision site)   Complete by: As directed    Call MD for:  severe uncontrolled pain   Complete by: As directed    Call MD for:  temperature >100.4   Complete by: As directed    Diet - low sodium heart healthy   Complete by: As directed    Discharge instructions   Complete by: As directed    Ms. Von [name] Kindle,  It was a pleasure caring for you during your hospital stay. You were admitted with chest pain radiating to the back. Your imaging was significant with thoracic aortic aneurysm with penetrating ulcers resulting in intramural hematoma. You had surgical repair of this with Dr. Trula Slade. You were observed after the surgery and remained stable. At this time, you are being discharged home and are to continue taking your blood pressure medications as prescribed. Please follow up with Dr. Trula Slade in 4 weeks and cardiology in 2 weeks. Please schedule an appointment at your earliest convenience with your PCP.   Please contact us if you have any questions or concerns. Thank you, Harvie Heck, MD Pager: [phone]   Increase activity slowly   Complete by: As directed       Signed: Harvie Heck, MD  Internal Medicine, PGY-1  04/12/2019, 10:58 AM   Pager: [phone]

## 2019-04-12 NOTE — Evaluation (Signed)
Physical Therapy Evaluation Patient Details Name: Gloria [name] MRN: [id number] DOB: 30-Dec-1925 Today's Date: 04/12/2019   History of Present Illness  Patient is a 86yo female with PMHx of RA, GERD, HTN and thoracic aortic aneurysm who presented to [facility] on 04/06/2019 with constant chest pain radiating to back. CTA with early thoracic aortic dissection and penetrating ulcer resulting in acute Type B intramural hematoma. S/p TEVAR 04/10/2018.  Clinical Impression  Gloria [name] s/p procedure listed above. Prior to admission, Gloria [name] lives in [place] and is very active; reports walking one mile daily. Gloria [name] daughter plans to live with her initially to assist as needed. On Gloria [name] evaluation, Gloria [name] ambulating block around unit at a min guard assist level. HR stable in 70's, BP 133/64 post mobility. Gloria [name] with mild dynamic instability and decreased endurance compared to baseline. Education provided regarding exercise recommendations and progression, endurance conservation techniques, use of walker for unlevel surfaces and for nighttime trips to bathroom initially. Gloria [name] verbalized understanding and has no further questions. No need for Gloria [name] follow up. Thank you for this consult; Gloria [name] signing off.     Follow Up Recommendations No Gloria [name] follow up;Supervision for mobility/OOB    Equipment Recommendations  None recommended by Gloria [name]    Recommendations for Other Services       Precautions / Restrictions Precautions Precautions: Fall Restrictions Weight Bearing Restrictions: No      Mobility  Bed Mobility Overal bed mobility: Modified Independent                Transfers Overall transfer level: Needs assistance Equipment used: None Transfers: Sit to/from Stand Sit to Stand: Supervision         General transfer comment: supervision for line management  Ambulation/Gait Ambulation/Gait assistance: Min guard Gait Distance (Feet): 300 Feet Assistive device: None Gait Pattern/deviations:  Step-through pattern;Decreased stride length Gait velocity: decreased   General Gait Details: Mild dynamic unsteadiness, requiring min guard assist  Stairs            Wheelchair Mobility    Modified Rankin (Stroke Patients Only)       Balance Overall balance assessment: Mild deficits observed, not formally tested                                           Pertinent Vitals/Pain Pain Assessment: Faces Faces Pain Scale: Hurts a little bit Pain Location: mild epigastric discomfort Pain Descriptors / Indicators: Discomfort Pain Intervention(s): Monitored during session    Home Living Family/patient expects to be discharged to:: Private residence Living Arrangements: Alone Available Help at Discharge: Family;Available 24 hours/day((initially)) Type of Home: Independent living facility Home Access: Level entry     Home Layout: One level Home Equipment: Walker - 2 wheels Additional Comments: Gloria [name] lives at [facility] facility. Gloria [name] daughter plans to stay with her initially to assist    Prior Function Level of Independence: Independent         Comments: Walks 1 mile per day     Hand Dominance        Extremity/Trunk Assessment   Upper Extremity Assessment Upper Extremity Assessment: Overall WFL for tasks assessed    Lower Extremity Assessment Lower Extremity Assessment: Overall WFL for tasks assessed    Cervical / Trunk Assessment Cervical / Trunk Assessment: Kyphotic  Communication   Communication: No difficulties  Cognition Arousal/Alertness: Awake/alert Behavior During Therapy: [facility]  for tasks assessed/performed Overall Cognitive Status: Within Functional Limits for tasks assessed                                        General Comments      Exercises     Assessment/Plan    Gloria [name] Assessment Patent does not need any further Gloria [name] services  Gloria [name] Problem List Decreased strength;Decreased activity  tolerance;Decreased balance;Decreased mobility       Gloria [name] Treatment Interventions      Gloria [name] Goals (Current goals can be found in the Care Plan section)  Acute Rehab Gloria [name] Goals Patient Stated Goal: "go home today." Gloria [name] Goal Formulation: With patient Time For Goal Achievement: 04/26/19 Potential to Achieve Goals: Good    Frequency     Barriers to discharge        Co-evaluation               AM-PAC Gloria [name] "6 Clicks" Mobility  Outcome Measure Help needed turning from your back to your side while in a flat bed without using bedrails?: None Help needed moving from lying on your back to sitting on the side of a flat bed without using bedrails?: None Help needed moving to and from a bed to a chair (including a wheelchair)?: None Help needed standing up from a chair using your arms (e.g., wheelchair or bedside chair)?: None Help needed to walk in hospital room?: A Little Help needed climbing 3-5 steps with a railing? : A Little 6 Click Score: 22    End of Session   Activity Tolerance: Patient tolerated treatment well Patient left: in bed;with call bell/phone within reach;with bed alarm set Nurse Communication: Mobility status Gloria [name] Visit Diagnosis: Unsteadiness on feet (R26.81)    Time: [id number] Gloria [name] Time Calculation (min) (ACUTE ONLY): 26 min   Charges:   Gloria [name] Evaluation $Gloria [name] Eval Moderate Complexity: 1 Mod Gloria [name] Treatments $Therapeutic Activity: 8-22 mins        Ellamae Sia, Gloria [name], DPT Acute Rehabilitation Services Pager [phone] Office [phone]   Willy Eddy 04/12/2019, 11:11 AM

## 2019-04-12 NOTE — Plan of Care (Signed)

## 2019-04-12 NOTE — Plan of Care (Signed)
Pt is alert and oriented, has been weaned to room air, no complaints or concerns of shortness of breath noted. Pt is able to move all extremities and required minimal assistance with her bath this AM. Pt walked over to the chair. Aline insertion site has some minor bruising and femoral access site oozes when pressed on, but no bruising noted. Pt complains of back discomfort but refuses pain medication. No complaints or concerns voiced at this time. Chair alarm is on and call bell is within reach. Problem: Clinical Measurements: Goal: Ability to maintain clinical measurements within normal limits will improve Outcome: Progressing   Problem: Activity: Goal: Risk for activity intolerance will decrease Outcome: Progressing   Problem: Coping: Goal: Level of anxiety will decrease Outcome: Progressing   Problem: Pain Managment: Goal: General experience of comfort will improve Outcome: Progressing   Problem: Safety: Goal: Ability to remain free from injury will improve Outcome: Progressing

## 2019-04-12 NOTE — Progress Notes (Addendum)
Progress Note    04/12/2019 7:22 AM 1 Day Post-Op  Subjective: The patient is status post TEVAR yesterday.  This morning, she is out of bed to chair eating breakfast.  She is complaining of some intermittent epigastric pain.  She states her back pain has improved over the course of her hospitalization.  She denies lower extremity pain.  She is voiding spontaneously.    Vitals:   04/12/19 0600 04/12/19 0700  BP: (!) 119/58 130/72  Pulse: 64 70  Resp: 17 19  Temp:    SpO2: 94% 96%    Physical Exam: Cardiac: Heart rate and rhythm regular Lungs: Clear to auscultation bilaterally Extremities: Feet warm, no skin changes.  Right groin without oozing, hematoma or edema. Abdomen: Soft  CBC    Component Value Date/Time   WBC 11.9 (H) 04/12/2019 0337   RBC 3.36 (L) 04/12/2019 0337   HGB 10.5 (L) 04/12/2019 0337   HCT 31.2 (L) 04/12/2019 0337   PLT 135 (L) 04/12/2019 0337   MCV 92.9 04/12/2019 0337   MCH 31.3 04/12/2019 0337   MCHC 33.7 04/12/2019 0337   RDW 14.6 04/12/2019 0337   LYMPHSABS 2.0 04/06/2019 1813   MONOABS 0.7 04/06/2019 1813   EOSABS 0.1 04/06/2019 1813   BASOSABS 0.1 04/06/2019 1813    BMET    Component Value Date/Time   NA 139 04/12/2019 0337   K 3.5 04/12/2019 0337   CL 108 04/12/2019 0337   CO2 22 04/12/2019 0337   GLUCOSE 142 (H) 04/12/2019 0337   BUN 14 04/12/2019 0337   CREATININE 0.63 04/12/2019 0337   CALCIUM 8.0 (L) 04/12/2019 0337   GFRNONAA >60 04/12/2019 0337   GFRAA >60 04/12/2019 [id number]     Intake/Output Summary (Last 24 hours) at 04/12/2019 0722 Last data filed at 04/12/2019 [id number] Gross per 24 hour  Intake 1119.97 ml  Output 1090 ml  Net 29.97 ml    HOSPITAL MEDICATIONS Scheduled Meds: . amLODipine  10 mg Oral Daily  . aspirin EC  81 mg Oral Q0600  . atorvastatin  20 mg Oral q1800  . carvedilol  6.25 mg Oral BID WC  . Chlorhexidine Gluconate Cloth  6 each Topical Daily  . docusate sodium  100 mg Oral Daily  . folic acid  1 mg  Oral Daily  . heparin  5,000 Units Subcutaneous Q8H  . levothyroxine  75 mcg Oral Q0600  . mouth rinse  15 mL Mouth Rinse BID  . [START ON 04/13/2019] methotrexate  15 mg Oral Q Fri  . pantoprazole  40 mg Oral Daily  . predniSONE  2.5 mg Oral Q breakfast  . senna-docusate  2 tablet Oral BID  . sodium chloride flush  10-40 mL Intracatheter Q12H   Continuous Infusions: . sodium chloride    . sodium chloride 75 mL/hr at 04/11/19 1500  . magnesium sulfate bolus IVPB     PRN Meds:.sodium chloride, alum & mag hydroxide-simeth, guaiFENesin-dextromethorphan, hydrALAZINE, labetalol, magnesium sulfate bolus IVPB, metoprolol tartrate, morphine injection, ondansetron (ZOFRAN) IV, oxyCODONE, phenol, potassium chloride, sodium chloride flush  Assessment:  86 y.o. female is s/p: TEVAR secondary to descending thoracic aortic dissection.  She is hemodynamically stable. Good UOP with normal creatinine, Hgb stable.  1 Day Post-Op  Plan: -Stable from vascular surgery perspective.  She will need to follow-up in the office in 4 weeks with Dr. Trula Slade with CTA of the chest, abdomen, and pelvis. Continue ASA -DVT prophylaxis:  heparin   Risa Grill, PA-C [facility]  [phone] 04/12/2019  7:22 AM   I agree with the above.  She is doing very well this am.  If she does well and is cleared by PT, she could go home today.  She needs strict blood pressure management and needs referral to cardiology HTN cliinc.  I will see her back in 4 weeks  Wells Leanna Hamid

## 2019-04-13 LAB — TYPE AND SCREEN
ABO/RH(D): O POS
Antibody Screen: NEGATIVE
Unit division: 0
Unit division: 0

## 2019-04-13 LAB — BPAM RBC
Blood Product Expiration Date: 202102062359
Blood Product Expiration Date: 202102062359
Unit Type and Rh: 5100
Unit Type and Rh: 5100

## 2019-04-17 ENCOUNTER — Other Ambulatory Visit: Payer: Self-pay

## 2019-04-17 ENCOUNTER — Other Ambulatory Visit: Payer: Self-pay | Admitting: Internal Medicine

## 2019-04-17 ENCOUNTER — Ambulatory Visit
Admission: RE | Admit: 2019-04-17 | Discharge: 2019-04-17 | Disposition: A | Discharge status: Home / Self Care | Payer: Medicare PPO | Source: Ambulatory Visit | Attending: Internal Medicine | Admitting: Internal Medicine

## 2019-04-17 DIAGNOSIS — I7121 Aneurysm of the ascending aorta, without rupture: Secondary | ICD-10-CM

## 2019-04-17 DIAGNOSIS — I712 Thoracic aortic aneurysm, without rupture: Secondary | ICD-10-CM | POA: Insufficient documentation

## 2019-04-17 MED ORDER — IOHEXOL 350 MG/ML SOLN
100.0000 mL | Freq: Once | INTRAVENOUS | Status: AC | PRN
  Administered 2019-04-17: 100 mL via INTRAVENOUS

## 2019-04-25 ENCOUNTER — Other Ambulatory Visit: Payer: Self-pay

## 2019-04-25 DIAGNOSIS — I7121 Aneurysm of the ascending aorta, without rupture: Secondary | ICD-10-CM

## 2019-04-25 DIAGNOSIS — I712 Thoracic aortic aneurysm, without rupture: Secondary | ICD-10-CM

## 2019-05-02 ENCOUNTER — Ambulatory Visit: Payer: Medicare PPO | Admitting: Physician Assistant

## 2019-05-08 DIAGNOSIS — D849 Immunodeficiency, unspecified: Secondary | ICD-10-CM | POA: Insufficient documentation

## 2019-05-10 ENCOUNTER — Ambulatory Visit
Admission: RE | Admit: 2019-05-10 | Discharge: 2019-05-10 | Disposition: A | Discharge status: Home / Self Care | Payer: Medicare PPO | Source: Ambulatory Visit | Attending: Surgery | Admitting: Surgery

## 2019-05-10 ENCOUNTER — Other Ambulatory Visit: Payer: Medicare PPO

## 2019-05-10 DIAGNOSIS — I7121 Aneurysm of the ascending aorta, without rupture: Secondary | ICD-10-CM

## 2019-05-10 DIAGNOSIS — I712 Thoracic aortic aneurysm, without rupture: Secondary | ICD-10-CM

## 2019-05-10 MED ORDER — IOPAMIDOL (ISOVUE-370) INJECTION 76%
75.0000 mL | Freq: Once | INTRAVENOUS | Status: AC | PRN
  Administered 2019-05-10: 75 mL via INTRAVENOUS

## 2019-05-11 ENCOUNTER — Telehealth (HOSPITAL_COMMUNITY): Payer: Self-pay

## 2019-05-11 NOTE — Telephone Encounter (Signed)

## 2019-05-14 ENCOUNTER — Ambulatory Visit (INDEPENDENT_AMBULATORY_CARE_PROVIDER_SITE_OTHER): Payer: Medicare PPO | Admitting: Surgery

## 2019-05-14 ENCOUNTER — Encounter: Payer: Self-pay | Admitting: Surgery

## 2019-05-14 ENCOUNTER — Other Ambulatory Visit: Payer: Self-pay

## 2019-05-14 VITALS — BP 119/63 | HR 62 | Temp 97.8°F | Resp 20 | Ht 67.0 in | Wt 149.0 lb

## 2019-05-14 DIAGNOSIS — I712 Thoracic aortic aneurysm, without rupture, unspecified: Secondary | ICD-10-CM

## 2019-05-14 NOTE — Progress Notes (Signed)
Patient name: Gloria [name] MRN: [id number] DOB: 13-Mar-1926 Sex: female  REASON FOR VISIT:     post op  HISTORY OF PRESENT ILLNESS:   Gloria [name] is a 86 y.o. female The patient presented to the emergency department on 04-11-2019 with chest and back pain.  CT angiography identified a intramural hematoma within the descending thoracic aorta with extension into the transverse arch.  She was admitted to the ICU for blood pressure and pain control.  With adequate blood pressure control, she had persistent pain.  A repeat CT scan showed progression of the intramural hematoma and evidence of dissection.  Based on her clinical condition and CT scan progression, repair was necessary.  On 04/11/2019 she underwent endovascular repair without coverage of her left subclavian artery.  Her pain improved and she was discharged home.  She is back today for follow-up.  She is complaining of fatigue and some lower back pain.  CURRENT MEDICATIONS:    Current Outpatient Medications  Medication Sig Dispense Refill  . amLODipine (NORVASC) 10 MG tablet Take 1 tablet (10 mg total) by mouth daily. 30 tablet 0  . aspirin EC 81 MG EC tablet Take 1 tablet (81 mg total) by mouth daily at 6 (six) AM. 30 tablet 0  . atorvastatin (LIPITOR) 20 MG tablet Take 1 tablet (20 mg total) by mouth daily at 6 PM. 30 tablet 0  . carvedilol (COREG) 6.25 MG tablet Take 1 tablet (6.25 mg total) by mouth 2 (two) times daily with a meal. 60 tablet 0  . folic acid (FOLVITE) 1 MG tablet TAKE 1 TABLET EVERY DAY    . levothyroxine (SYNTHROID, LEVOTHROID) 75 MCG tablet Take 75 mcg by mouth daily.   11  . methotrexate (RHEUMATREX) 2.5 MG tablet TAKE 6 TABLETS (15 MG TOTAL) BY MOUTH EVERY 7 (SEVEN) DAYS FOR 168 DAYS    . Multiple Vitamin (MULTIVITAMIN WITH MINERALS) TABS tablet Take 1 tablet by mouth daily.    . predniSONE (DELTASONE) 5 MG tablet Take 2.5 mg by mouth daily.    . traMADol (ULTRAM) 50 MG  tablet Take 50 mg by mouth 2 (two) times daily as needed.     No current facility-administered medications for this visit.    REVIEW OF SYSTEMS:   [X]  denotes positive finding, [ ]  denotes negative finding Cardiac  Comments:  Chest pain or chest pressure:    Shortness of breath upon exertion:    Short of breath when lying flat:    Irregular heart rhythm:    Constitutional    Fever or chills:      PHYSICAL EXAM:   There were no vitals filed for this visit.  GENERAL: The patient is a well-nourished female, in no acute distress. The vital signs are documented above. CARDIOVASCULAR: There is a regular rate and rhythm. PULMONARY: Non-labored respirations   STUDIES:   Abdominal atherosclerosis as above without acute vascular process, aneurysm or dissection.  Chronic celiac ostial high-grade stenosis versus near occlusion with preserved patency of the celiac vascular territory through SMA collateral pathways  NON-VASCULAR  Resolved right pleural effusion and improvement in the left effusion. Minimal associated left lower lobe atelectasis  No other acute intra-abdominal or pelvic finding by CTA.    MEDICAL ISSUES:   Descending aortic dissection: The patient's stent is in good position.  She is still having some back pain however this is felt to be musculoskeletal in origin.  She is receiving PT to help with this.  She also  complains of fatigue which I explained to her is not unexpected given her hospital course.  I would expect this should resolve within a month or 2.  I have her scheduled for follow-up in 6 months with repeat CT angiogram of the chest.  We discussed the importance of strict blood pressure control with a target blood pressure of 120/80.  Leia Alf, MD, FACS [facility] [phone] Pager [phone]

## 2019-10-17 ENCOUNTER — Other Ambulatory Visit: Payer: Self-pay

## 2019-10-17 DIAGNOSIS — I712 Thoracic aortic aneurysm, without rupture, unspecified: Secondary | ICD-10-CM

## 2019-11-07 ENCOUNTER — Other Ambulatory Visit: Payer: Medicare PPO

## 2019-11-12 ENCOUNTER — Ambulatory Visit: Payer: Medicare PPO | Admitting: Surgery

## 2019-11-14 ENCOUNTER — Other Ambulatory Visit: Payer: Self-pay

## 2019-11-30 ENCOUNTER — Inpatient Hospital Stay: Admission: RE | Admit: 2019-11-30 | Payer: Medicare PPO | Source: Ambulatory Visit

## 2019-11-30 ENCOUNTER — Emergency Department: Payer: Medicare PPO

## 2019-11-30 ENCOUNTER — Inpatient Hospital Stay: Payer: Medicare PPO

## 2019-11-30 ENCOUNTER — Inpatient Hospital Stay: Payer: Medicare PPO | Admitting: Certified Registered Nurse Anesthetist

## 2019-11-30 ENCOUNTER — Inpatient Hospital Stay
Admission: EM | Admit: 2019-11-30 | Discharge: 2019-12-03 | DRG: 521 | Disposition: A | Discharge status: Skilled Nursing Facility | Payer: Medicare PPO | Attending: Internal Medicine | Admitting: Internal Medicine

## 2019-11-30 ENCOUNTER — Encounter
Admission: EM | Disposition: A | Discharge status: Skilled Nursing Facility | Payer: Self-pay | Source: Home / Self Care | Attending: Internal Medicine

## 2019-11-30 ENCOUNTER — Other Ambulatory Visit: Payer: Self-pay

## 2019-11-30 ENCOUNTER — Encounter: Payer: Self-pay | Admitting: Emergency Medicine

## 2019-11-30 DIAGNOSIS — S72009A Fracture of unspecified part of neck of unspecified femur, initial encounter for closed fracture: Secondary | ICD-10-CM | POA: Diagnosis present

## 2019-11-30 DIAGNOSIS — M25511 Pain in right shoulder: Secondary | ICD-10-CM | POA: Insufficient documentation

## 2019-11-30 DIAGNOSIS — Z20822 Contact with and (suspected) exposure to covid-19: Secondary | ICD-10-CM | POA: Diagnosis present

## 2019-11-30 DIAGNOSIS — M899 Disorder of bone, unspecified: Secondary | ICD-10-CM | POA: Diagnosis not present

## 2019-11-30 DIAGNOSIS — M19019 Primary osteoarthritis, unspecified shoulder: Secondary | ICD-10-CM | POA: Diagnosis present

## 2019-11-30 DIAGNOSIS — Z7982 Long term (current) use of aspirin: Secondary | ICD-10-CM | POA: Diagnosis not present

## 2019-11-30 DIAGNOSIS — D72828 Other elevated white blood cell count: Secondary | ICD-10-CM | POA: Diagnosis not present

## 2019-11-30 DIAGNOSIS — R519 Headache, unspecified: Secondary | ICD-10-CM | POA: Diagnosis present

## 2019-11-30 DIAGNOSIS — S72001A Fracture of unspecified part of neck of right femur, initial encounter for closed fracture: Principal | ICD-10-CM | POA: Diagnosis present

## 2019-11-30 DIAGNOSIS — E039 Hypothyroidism, unspecified: Secondary | ICD-10-CM | POA: Diagnosis present

## 2019-11-30 DIAGNOSIS — I10 Essential (primary) hypertension: Secondary | ICD-10-CM | POA: Diagnosis present

## 2019-11-30 DIAGNOSIS — Z79890 Hormone replacement therapy (postmenopausal): Secondary | ICD-10-CM | POA: Diagnosis not present

## 2019-11-30 DIAGNOSIS — M1990 Unspecified osteoarthritis, unspecified site: Secondary | ICD-10-CM | POA: Diagnosis present

## 2019-11-30 DIAGNOSIS — Z8781 Personal history of (healed) traumatic fracture: Secondary | ICD-10-CM | POA: Insufficient documentation

## 2019-11-30 DIAGNOSIS — Z66 Do not resuscitate: Secondary | ICD-10-CM | POA: Diagnosis present

## 2019-11-30 DIAGNOSIS — W19XXXA Unspecified fall, initial encounter: Secondary | ICD-10-CM | POA: Diagnosis not present

## 2019-11-30 DIAGNOSIS — E876 Hypokalemia: Secondary | ICD-10-CM | POA: Diagnosis not present

## 2019-11-30 DIAGNOSIS — Z79899 Other long term (current) drug therapy: Secondary | ICD-10-CM | POA: Diagnosis not present

## 2019-11-30 DIAGNOSIS — Y92012 Bathroom of single-family (private) house as the place of occurrence of the external cause: Secondary | ICD-10-CM

## 2019-11-30 DIAGNOSIS — G9341 Metabolic encephalopathy: Secondary | ICD-10-CM | POA: Diagnosis not present

## 2019-11-30 DIAGNOSIS — I712 Thoracic aortic aneurysm, without rupture: Secondary | ICD-10-CM | POA: Diagnosis present

## 2019-11-30 DIAGNOSIS — E785 Hyperlipidemia, unspecified: Secondary | ICD-10-CM | POA: Diagnosis present

## 2019-11-30 DIAGNOSIS — D649 Anemia, unspecified: Secondary | ICD-10-CM | POA: Diagnosis present

## 2019-11-30 DIAGNOSIS — M949 Disorder of cartilage, unspecified: Secondary | ICD-10-CM | POA: Diagnosis not present

## 2019-11-30 DIAGNOSIS — Z885 Allergy status to narcotic agent status: Secondary | ICD-10-CM

## 2019-11-30 DIAGNOSIS — M069 Rheumatoid arthritis, unspecified: Secondary | ICD-10-CM | POA: Diagnosis present

## 2019-11-30 DIAGNOSIS — I739 Peripheral vascular disease, unspecified: Secondary | ICD-10-CM | POA: Diagnosis present

## 2019-11-30 DIAGNOSIS — R12 Heartburn: Secondary | ICD-10-CM | POA: Diagnosis present

## 2019-11-30 DIAGNOSIS — W010XXA Fall on same level from slipping, tripping and stumbling without subsequent striking against object, initial encounter: Secondary | ICD-10-CM | POA: Diagnosis present

## 2019-11-30 DIAGNOSIS — Z882 Allergy status to sulfonamides status: Secondary | ICD-10-CM | POA: Diagnosis not present

## 2019-11-30 DIAGNOSIS — Z96649 Presence of unspecified artificial hip joint: Secondary | ICD-10-CM

## 2019-11-30 DIAGNOSIS — I440 Atrioventricular block, first degree: Secondary | ICD-10-CM | POA: Diagnosis present

## 2019-11-30 DIAGNOSIS — K219 Gastro-esophageal reflux disease without esophagitis: Secondary | ICD-10-CM | POA: Diagnosis present

## 2019-11-30 DIAGNOSIS — I7121 Aneurysm of the ascending aorta, without rupture: Secondary | ICD-10-CM | POA: Diagnosis present

## 2019-11-30 DIAGNOSIS — Z888 Allergy status to other drugs, medicaments and biological substances status: Secondary | ICD-10-CM

## 2019-11-30 DIAGNOSIS — M353 Polymyalgia rheumatica: Secondary | ICD-10-CM | POA: Diagnosis present

## 2019-11-30 DIAGNOSIS — S0990XA Unspecified injury of head, initial encounter: Secondary | ICD-10-CM

## 2019-11-30 DIAGNOSIS — Z7952 Long term (current) use of systemic steroids: Secondary | ICD-10-CM

## 2019-11-30 DIAGNOSIS — Z95828 Presence of other vascular implants and grafts: Secondary | ICD-10-CM

## 2019-11-30 HISTORY — DX: Personal history of (healed) traumatic fracture: Z87.81

## 2019-11-30 HISTORY — PX: HIP ARTHROPLASTY: SHX981

## 2019-11-30 HISTORY — DX: Fracture of unspecified part of neck of right femur, initial encounter for closed fracture: S72.001A

## 2019-11-30 HISTORY — DX: Fracture of unspecified part of neck of unspecified femur, initial encounter for closed fracture: S72.009A

## 2019-11-30 LAB — CBC WITH DIFFERENTIAL/PLATELET
Abs Immature Granulocytes: 0.04 10*3/uL (ref 0.00–0.07)
Basophils Absolute: 0.1 10*3/uL (ref 0.0–0.1)
Basophils Relative: 1 %
Eosinophils Absolute: 0.3 10*3/uL (ref 0.0–0.5)
Eosinophils Relative: 3 %
HCT: 35.4 % — ABNORMAL LOW (ref 36.0–46.0)
Hemoglobin: 12 g/dL (ref 12.0–15.0)
Immature Granulocytes: 1 %
Lymphocytes Relative: 27 %
Lymphs Abs: 2.2 10*3/uL (ref 0.7–4.0)
MCH: 31.3 pg (ref 26.0–34.0)
MCHC: 33.9 g/dL (ref 30.0–36.0)
MCV: 92.4 fL (ref 80.0–100.0)
Monocytes Absolute: 1.1 10*3/uL — ABNORMAL HIGH (ref 0.1–1.0)
Monocytes Relative: 13 %
Neutro Abs: 4.6 10*3/uL (ref 1.7–7.7)
Neutrophils Relative %: 55 %
Platelets: 180 10*3/uL (ref 150–400)
RBC: 3.83 MIL/uL — ABNORMAL LOW (ref 3.87–5.11)
RDW: 16.5 % — ABNORMAL HIGH (ref 11.5–15.5)
WBC: 8.2 10*3/uL (ref 4.0–10.5)
nRBC: 0 % (ref 0.0–0.2)

## 2019-11-30 LAB — PROTIME-INR
INR: 1 (ref 0.8–1.2)
Prothrombin Time: 12.6 seconds (ref 11.4–15.2)

## 2019-11-30 LAB — TYPE AND SCREEN
ABO/RH(D): O POS
Antibody Screen: NEGATIVE

## 2019-11-30 LAB — APTT: aPTT: 31 s (ref 24–36)

## 2019-11-30 LAB — BASIC METABOLIC PANEL
Anion gap: 9 (ref 5–15)
BUN: 17 mg/dL (ref 8–23)
CO2: 26 mmol/L (ref 22–32)
Calcium: 8.8 mg/dL — ABNORMAL LOW (ref 8.9–10.3)
Chloride: 107 mmol/L (ref 98–111)
Creatinine, Ser: 0.63 mg/dL (ref 0.44–1.00)
GFR calc Af Amer: >60 mL/min (ref >60)
GFR calc non Af Amer: >60 mL/min (ref >60)
Glucose, Bld: 93 mg/dL (ref 70–99)
Potassium: 3.6 mmol/L (ref 3.5–5.1)
Sodium: 142 mmol/L (ref 135–145)

## 2019-11-30 LAB — SARS CORONAVIRUS 2 BY RT PCR (HOSPITAL ORDER, PERFORMED IN ~~LOC~~ HOSPITAL LAB): SARS Coronavirus 2: NEGATIVE

## 2019-11-30 SURGERY — HEMIARTHROPLASTY, HIP, DIRECT ANTERIOR APPROACH, FOR FRACTURE
Anesthesia: General | Site: Hip | Laterality: Right

## 2019-11-30 MED ORDER — METOCLOPRAMIDE HCL 5 MG/ML IJ SOLN: 5.0000 mg | Freq: Three times a day (TID) | INTRAMUSCULAR | Status: DC | PRN

## 2019-11-30 MED ORDER — LEVOTHYROXINE SODIUM 50 MCG PO TABS: 75.0000 ug | ORAL_TABLET | Freq: Every day | ORAL | Status: DC

## 2019-11-30 MED ORDER — ADULT MULTIVITAMIN W/MINERALS CH
1.0000 | ORAL_TABLET | Freq: Every day | ORAL | Status: DC
  Administered 2019-12-01 – 2019-12-03 (×3): 1 via ORAL
  Filled 2019-11-30 (×3): qty 1

## 2019-11-30 MED ORDER — METHOTREXATE 2.5 MG PO TABS: 2.5000 mg | ORAL_TABLET | ORAL | Status: DC

## 2019-11-30 MED ORDER — PROPOFOL 10 MG/ML IV BOLUS
INTRAVENOUS | Status: AC
  Filled 2019-11-30: qty 20

## 2019-11-30 MED ORDER — ACETAMINOPHEN 325 MG PO TABS: 325.0000 mg | ORAL_TABLET | Freq: Four times a day (QID) | ORAL | Status: DC | PRN

## 2019-11-30 MED ORDER — FENTANYL CITRATE (PF) 100 MCG/2ML IJ SOLN
INTRAMUSCULAR | Status: DC | PRN
  Administered 2019-11-30 (×2): 25 ug via INTRAVENOUS

## 2019-11-30 MED ORDER — PROPOFOL 10 MG/ML IV BOLUS
INTRAVENOUS | Status: DC | PRN
  Administered 2019-11-30: 20 mg via INTRAVENOUS
  Administered 2019-11-30 (×2): 30 mg via INTRAVENOUS

## 2019-11-30 MED ORDER — PREDNISONE 2.5 MG PO TABS
2.5000 mg | ORAL_TABLET | Freq: Every day | ORAL | Status: DC
  Administered 2019-12-01 – 2019-12-03 (×3): 2.5 mg via ORAL
  Filled 2019-11-30 (×4): qty 1

## 2019-11-30 MED ORDER — CEFAZOLIN SODIUM-DEXTROSE 2-4 GM/100ML-% IV SOLN
2.0000 g | Freq: Four times a day (QID) | INTRAVENOUS | Status: AC
  Administered 2019-12-01 (×2): 2 g via INTRAVENOUS
  Filled 2019-11-30 (×3): qty 100

## 2019-11-30 MED ORDER — SODIUM CHLORIDE 0.9 % IV SOLN
INTRAVENOUS | Status: DC | PRN
  Administered 2019-11-30: 50 ug/min via INTRAVENOUS

## 2019-11-30 MED ORDER — SODIUM CHLORIDE 0.9 % IV SOLN: INTRAVENOUS | Status: DC

## 2019-11-30 MED ORDER — BISACODYL 10 MG RE SUPP: 10.0000 mg | Freq: Every day | RECTAL | Status: DC | PRN

## 2019-11-30 MED ORDER — FLEET ENEMA 7-19 GM/118ML RE ENEM: 1.0000 | ENEMA | Freq: Once | RECTAL | Status: DC | PRN

## 2019-11-30 MED ORDER — ENOXAPARIN SODIUM 40 MG/0.4ML ~~LOC~~ SOLN
40.0000 mg | SUBCUTANEOUS | Status: DC
  Administered 2019-12-01 – 2019-12-03 (×3): 40 mg via SUBCUTANEOUS
  Filled 2019-11-30 (×4): qty 0.4

## 2019-11-30 MED ORDER — ENSURE ENLIVE PO LIQD
237.0000 mL | Freq: Two times a day (BID) | ORAL | Status: DC
  Administered 2019-12-01 – 2019-12-03 (×4): 237 mL via ORAL
  Filled 2019-11-30: qty 237

## 2019-11-30 MED ORDER — AMLODIPINE BESYLATE 5 MG PO TABS
5.0000 mg | ORAL_TABLET | Freq: Every day | ORAL | Status: DC
  Administered 2019-12-01 – 2019-12-03 (×3): 5 mg via ORAL
  Filled 2019-11-30 (×3): qty 1

## 2019-11-30 MED ORDER — METOCLOPRAMIDE HCL 10 MG PO TABS: 5.0000 mg | ORAL_TABLET | Freq: Three times a day (TID) | ORAL | Status: DC | PRN

## 2019-11-30 MED ORDER — BUPIVACAINE-EPINEPHRINE (PF) 0.5% -1:200000 IJ SOLN
INTRAMUSCULAR | Status: AC
  Filled 2019-11-30: qty 30

## 2019-11-30 MED ORDER — METHOCARBAMOL 500 MG PO TABS
500.0000 mg | ORAL_TABLET | Freq: Four times a day (QID) | ORAL | Status: DC | PRN
  Administered 2019-11-30 – 2019-12-03 (×7): 500 mg via ORAL
  Filled 2019-11-30 (×9): qty 1

## 2019-11-30 MED ORDER — DIPHENHYDRAMINE HCL 12.5 MG/5ML PO ELIX: 12.5000 mg | ORAL_SOLUTION | ORAL | Status: DC | PRN

## 2019-11-30 MED ORDER — BUPIVACAINE HCL (PF) 0.5 % IJ SOLN
INTRAMUSCULAR | Status: DC | PRN
  Administered 2019-11-30: 2.8 mL

## 2019-11-30 MED ORDER — TRANEXAMIC ACID 1000 MG/10ML IV SOLN
INTRAVENOUS | Status: DC | PRN
  Administered 2019-11-30: 1000 mg via TOPICAL

## 2019-11-30 MED ORDER — SODIUM CHLORIDE 0.9 % IV SOLN
INTRAVENOUS | Status: DC | PRN
  Administered 2019-11-30: 70 mL

## 2019-11-30 MED ORDER — ONDANSETRON HCL 4 MG/2ML IJ SOLN
INTRAMUSCULAR | Status: DC | PRN
  Administered 2019-11-30: 4 mg via INTRAVENOUS

## 2019-11-30 MED ORDER — DOCUSATE SODIUM 100 MG PO CAPS
100.0000 mg | ORAL_CAPSULE | Freq: Two times a day (BID) | ORAL | Status: DC
  Administered 2019-12-01 – 2019-12-02 (×3): 100 mg via ORAL
  Filled 2019-11-30 (×6): qty 1

## 2019-11-30 MED ORDER — CARVEDILOL 3.125 MG PO TABS
6.2500 mg | ORAL_TABLET | Freq: Two times a day (BID) | ORAL | Status: DC
  Administered 2019-11-30 – 2019-12-03 (×6): 6.25 mg via ORAL
  Filled 2019-11-30 (×6): qty 2
  Filled 2019-11-30: qty 1

## 2019-11-30 MED ORDER — MIDAZOLAM HCL 2 MG/2ML IJ SOLN
INTRAMUSCULAR | Status: AC
  Filled 2019-11-30: qty 2

## 2019-11-30 MED ORDER — SODIUM CHLORIDE FLUSH 0.9 % IV SOLN
INTRAVENOUS | Status: AC
  Filled 2019-11-30: qty 50

## 2019-11-30 MED ORDER — METHOTREXATE 2.5 MG PO TABS
15.0000 mg | ORAL_TABLET | ORAL | Status: DC
  Administered 2019-11-30: 15 mg via ORAL
  Filled 2019-11-30: qty 6

## 2019-11-30 MED ORDER — ONDANSETRON HCL 4 MG/2ML IJ SOLN: 4.0000 mg | Freq: Once | INTRAMUSCULAR | Status: DC | PRN

## 2019-11-30 MED ORDER — ONDANSETRON HCL 4 MG/2ML IJ SOLN: 4.0000 mg | Freq: Four times a day (QID) | INTRAMUSCULAR | Status: DC | PRN

## 2019-11-30 MED ORDER — ATORVASTATIN CALCIUM 20 MG PO TABS
20.0000 mg | ORAL_TABLET | Freq: Every day | ORAL | Status: DC
  Administered 2019-11-30 – 2019-12-03 (×4): 20 mg via ORAL
  Filled 2019-11-30 (×4): qty 1

## 2019-11-30 MED ORDER — FENTANYL CITRATE (PF) 100 MCG/2ML IJ SOLN
INTRAMUSCULAR | Status: AC
  Filled 2019-11-30: qty 2

## 2019-11-30 MED ORDER — POLYETHYLENE GLYCOL 3350 17 G PO PACK
17.0000 g | PACK | Freq: Every day | ORAL | Status: DC | PRN
  Administered 2019-12-01 – 2019-12-02 (×2): 17 g via ORAL
  Filled 2019-11-30 (×2): qty 1

## 2019-11-30 MED ORDER — ACETAMINOPHEN 500 MG PO TABS
500.0000 mg | ORAL_TABLET | Freq: Four times a day (QID) | ORAL | Status: AC
  Administered 2019-11-30 – 2019-12-01 (×3): 500 mg via ORAL
  Filled 2019-11-30 (×3): qty 1

## 2019-11-30 MED ORDER — MAGNESIUM HYDROXIDE 400 MG/5ML PO SUSP
30.0000 mL | Freq: Every day | ORAL | Status: DC | PRN
  Administered 2019-12-02: 30 mL via ORAL
  Filled 2019-11-30: qty 30

## 2019-11-30 MED ORDER — MORPHINE SULFATE (PF) 2 MG/ML IV SOLN: 2.0000 mg | INTRAVENOUS | Status: DC | PRN

## 2019-11-30 MED ORDER — ONDANSETRON HCL 4 MG/2ML IJ SOLN
4.0000 mg | Freq: Four times a day (QID) | INTRAMUSCULAR | Status: DC | PRN
  Administered 2019-11-30: 4 mg via INTRAVENOUS
  Filled 2019-11-30 (×2): qty 2

## 2019-11-30 MED ORDER — TRAMADOL HCL 50 MG PO TABS: 50.0000 mg | ORAL_TABLET | Freq: Four times a day (QID) | ORAL | Status: DC | PRN

## 2019-11-30 MED ORDER — ACETAMINOPHEN 10 MG/ML IV SOLN
INTRAVENOUS | Status: DC | PRN
  Administered 2019-11-30: 1000 mg via INTRAVENOUS

## 2019-11-30 MED ORDER — MORPHINE SULFATE (PF) 2 MG/ML IV SOLN: 0.5000 mg | INTRAVENOUS | Status: DC | PRN

## 2019-11-30 MED ORDER — HYDROCODONE-ACETAMINOPHEN 5-325 MG PO TABS
1.0000 | ORAL_TABLET | Freq: Four times a day (QID) | ORAL | Status: DC | PRN
  Administered 2019-12-01 (×2): 2 via ORAL
  Administered 2019-12-02: 1 via ORAL
  Filled 2019-11-30: qty 2
  Filled 2019-11-30: qty 1
  Filled 2019-11-30: qty 2

## 2019-11-30 MED ORDER — DEXAMETHASONE SODIUM PHOSPHATE 10 MG/ML IJ SOLN
INTRAMUSCULAR | Status: DC | PRN
  Administered 2019-11-30: 10 mg via INTRAVENOUS

## 2019-11-30 MED ORDER — TRANEXAMIC ACID 1000 MG/10ML IV SOLN
INTRAVENOUS | Status: AC
  Filled 2019-11-30: qty 10

## 2019-11-30 MED ORDER — SENNA 8.6 MG PO TABS
1.0000 | ORAL_TABLET | Freq: Two times a day (BID) | ORAL | Status: DC
  Administered 2019-11-30 – 2019-12-02 (×5): 8.6 mg via ORAL
  Filled 2019-11-30 (×7): qty 1

## 2019-11-30 MED ORDER — CEFAZOLIN SODIUM-DEXTROSE 2-4 GM/100ML-% IV SOLN
INTRAVENOUS | Status: AC
  Administered 2019-11-30: 2 g via INTRAVENOUS
  Filled 2019-11-30: qty 100

## 2019-11-30 MED ORDER — FENTANYL CITRATE (PF) 100 MCG/2ML IJ SOLN: 25.0000 ug | INTRAMUSCULAR | Status: DC | PRN

## 2019-11-30 MED ORDER — HYDRALAZINE HCL 20 MG/ML IJ SOLN: 10.0000 mg | Freq: Four times a day (QID) | INTRAMUSCULAR | Status: DC | PRN

## 2019-11-30 MED ORDER — FOLIC ACID 1 MG PO TABS
1.0000 mg | ORAL_TABLET | Freq: Every day | ORAL | Status: DC
  Administered 2019-12-01 – 2019-12-03 (×3): 1 mg via ORAL
  Filled 2019-11-30 (×3): qty 1

## 2019-11-30 MED ORDER — ONDANSETRON HCL 4 MG/2ML IJ SOLN
INTRAMUSCULAR | Status: AC
  Filled 2019-11-30: qty 2

## 2019-11-30 MED ORDER — PROPOFOL 500 MG/50ML IV EMUL
INTRAVENOUS | Status: DC | PRN
  Administered 2019-11-30: 70 ug/kg/min via INTRAVENOUS

## 2019-11-30 MED ORDER — ACETAMINOPHEN 10 MG/ML IV SOLN
INTRAVENOUS | Status: AC
  Filled 2019-11-30: qty 100

## 2019-11-30 MED ORDER — LEVOTHYROXINE SODIUM 50 MCG PO TABS
75.0000 ug | ORAL_TABLET | Freq: Every day | ORAL | Status: DC
  Administered 2019-12-01 – 2019-12-03 (×3): 75 ug via ORAL
  Filled 2019-11-30 (×4): qty 1

## 2019-11-30 MED ORDER — SODIUM CHLORIDE 0.9 % IV SOLN: Freq: Once | INTRAVENOUS | Status: AC

## 2019-11-30 MED ORDER — ONDANSETRON HCL 4 MG PO TABS: 4.0000 mg | ORAL_TABLET | Freq: Four times a day (QID) | ORAL | Status: DC | PRN

## 2019-11-30 MED ORDER — MORPHINE SULFATE (PF) 4 MG/ML IV SOLN
4.0000 mg | INTRAVENOUS | Status: DC | PRN
  Administered 2019-11-30 (×2): 4 mg via INTRAVENOUS
  Filled 2019-11-30 (×2): qty 1

## 2019-11-30 MED ORDER — LIDOCAINE HCL (PF) 2 % IJ SOLN
INTRAMUSCULAR | Status: AC
  Filled 2019-11-30: qty 5

## 2019-11-30 MED ORDER — PROPOFOL 500 MG/50ML IV EMUL
INTRAVENOUS | Status: AC
  Filled 2019-11-30: qty 50

## 2019-11-30 MED ORDER — LACTATED RINGERS IV SOLN: INTRAVENOUS | Status: DC | PRN

## 2019-11-30 MED ORDER — NEOMYCIN-POLYMYXIN B GU 40-200000 IR SOLN
Status: AC
  Filled 2019-11-30: qty 20

## 2019-11-30 MED ORDER — CEFAZOLIN SODIUM-DEXTROSE 2-4 GM/100ML-% IV SOLN
2.0000 g | INTRAVENOUS | Status: AC
  Administered 2019-11-30: 2 g via INTRAVENOUS
  Filled 2019-11-30: qty 100

## 2019-11-30 MED ORDER — BUPIVACAINE LIPOSOME 1.3 % IJ SUSP
INTRAMUSCULAR | Status: AC
  Filled 2019-11-30: qty 20

## 2019-11-30 SURGICAL SUPPLY — 66 items
APL PRP STRL LF DISP 70% ISPRP (MISCELLANEOUS) ×2
BAG DECANTER FOR FLEXI CONT (MISCELLANEOUS) IMPLANT
BLADE SAGITTAL WIDE XTHICK NO (BLADE) ×3 IMPLANT
BLADE SURG SZ20 CARB STEEL (BLADE) ×3 IMPLANT
BNDG COHESIVE 6X5 TAN STRL LF (GAUZE/BANDAGES/DRESSINGS) ×3 IMPLANT
BOWL CEMENT MIXING ADV NOZZLE (MISCELLANEOUS) IMPLANT
CANISTER SUCT 1200ML W/VALVE (MISCELLANEOUS) ×3 IMPLANT
CANISTER SUCT 3000ML PPV (MISCELLANEOUS) ×6 IMPLANT
CHLORAPREP W/TINT 26 (MISCELLANEOUS) ×6 IMPLANT
COVER WAND RF STERILE (DRAPES) ×3 IMPLANT
DECANTER SPIKE VIAL GLASS SM (MISCELLANEOUS) ×6 IMPLANT
DRAPE 3/4 80X56 (DRAPES) ×3 IMPLANT
DRAPE INCISE IOBAN 66X60 STRL (DRAPES) ×3 IMPLANT
DRAPE SPLIT 6X30 W/TAPE (DRAPES) ×6 IMPLANT
DRAPE SURG 17X11 SM STRL (DRAPES) ×3 IMPLANT
DRAPE SURG 17X23 STRL (DRAPES) ×3 IMPLANT
DRSG OPSITE POSTOP 4X12 (GAUZE/BANDAGES/DRESSINGS) ×3 IMPLANT
DRSG OPSITE POSTOP 4X14 (GAUZE/BANDAGES/DRESSINGS) IMPLANT
DRSG OPSITE POSTOP 4X8 (GAUZE/BANDAGES/DRESSINGS) ×3 IMPLANT
ELECT BLADE 6.5 EXT (BLADE) ×3 IMPLANT
ELECT CAUTERY BLADE 6.4 (BLADE) ×3 IMPLANT
ELECT REM PT RETURN 9FT ADLT (ELECTROSURGICAL) ×3
ELECTRODE REM PT RTRN 9FT ADLT (ELECTROSURGICAL) ×1 IMPLANT
GAUZE PACK 2X3YD (GAUZE/BANDAGES/DRESSINGS) IMPLANT
GAUZE XEROFORM 1X8 LF (GAUZE/BANDAGES/DRESSINGS) ×3 IMPLANT
GLOVE BIO SURGEON STRL SZ8 (GLOVE) ×6 IMPLANT
GLOVE BIOGEL M STRL SZ7.5 (GLOVE) IMPLANT
GLOVE BIOGEL PI IND STRL 8 (GLOVE) IMPLANT
GLOVE BIOGEL PI INDICATOR 8 (GLOVE)
GLOVE INDICATOR 8.0 STRL GRN (GLOVE) ×3 IMPLANT
GOWN STRL REUS W/ TWL LRG LVL3 (GOWN DISPOSABLE) ×1 IMPLANT
GOWN STRL REUS W/ TWL XL LVL3 (GOWN DISPOSABLE) ×1 IMPLANT
GOWN STRL REUS W/TWL LRG LVL3 (GOWN DISPOSABLE) ×3
GOWN STRL REUS W/TWL XL LVL3 (GOWN DISPOSABLE) ×3
HEAD ENDO II MOD SZ 46 (Orthopedic Implant) ×3 IMPLANT
HOOD PEEL AWAY FLYTE STAYCOOL (MISCELLANEOUS) ×9 IMPLANT
INSERT TAPER ENDO II STD (Orthopedic Implant) ×3 IMPLANT
IV NS 100ML SINGLE PACK (IV SOLUTION) IMPLANT
LABEL OR SOLS (LABEL) ×3 IMPLANT
NDL SAFETY ECLIPSE 18X1.5 (NEEDLE) ×1 IMPLANT
NEEDLE FILTER BLUNT 18X 1/2SAF (NEEDLE) ×2
NEEDLE FILTER BLUNT 18X1 1/2 (NEEDLE) ×1 IMPLANT
NEEDLE HYPO 18GX1.5 SHARP (NEEDLE) ×3
NEEDLE SPNL 20GX3.5 QUINCKE YW (NEEDLE) ×3 IMPLANT
NS IRRIG 1000ML POUR BTL (IV SOLUTION) ×3 IMPLANT
PACK HIP PROSTHESIS (MISCELLANEOUS) ×3 IMPLANT
PULSAVAC PLUS IRRIG FAN TIP (DISPOSABLE) ×3
SOL .9 NS 3000ML IRR  AL (IV SOLUTION) ×3
SOL .9 NS 3000ML IRR AL (IV SOLUTION) ×1
SOL .9 NS 3000ML IRR UROMATIC (IV SOLUTION) ×1 IMPLANT
STAPLER SKIN PROX 35W (STAPLE) ×3 IMPLANT
STEM COLLARLESS RED 15X155X130 (Stem) ×3 IMPLANT
STRAP SAFETY 5IN WIDE (MISCELLANEOUS) ×3 IMPLANT
SUT ETHIBOND 2 V 37 (SUTURE) ×3 IMPLANT
SUT VIC AB 1 CT1 36 (SUTURE) IMPLANT
SUT VIC AB 2-0 CT1 (SUTURE) ×9 IMPLANT
SUT VIC AB 2-0 CT1 27 (SUTURE)
SUT VIC AB 2-0 CT1 TAPERPNT 27 (SUTURE) IMPLANT
SUT VICRYL 1-0 27IN ABS (SUTURE) ×6
SUTURE VICRYL 1-0 27IN ABS (SUTURE) ×2 IMPLANT
SYR 10ML LL (SYRINGE) ×3 IMPLANT
SYR 30ML LL (SYRINGE) ×9 IMPLANT
SYR TB 1ML 27GX1/2 LL (SYRINGE) IMPLANT
TAPE TRANSPORE STRL 2 31045 (GAUZE/BANDAGES/DRESSINGS) ×3 IMPLANT
TIP BRUSH PULSAVAC PLUS 24.33 (MISCELLANEOUS) ×3 IMPLANT
TIP FAN IRRIG PULSAVAC PLUS (DISPOSABLE) ×1 IMPLANT

## 2019-11-30 NOTE — ED Triage Notes (Signed)
Patient presents to Emergency Department via [facility] from home with complaints of fall from standing position with pain at the right hip.    Per EMS no blood thinners, right leg is bent slightly  At knee but right leg appears shorter

## 2019-11-30 NOTE — Consult Note (Signed)
ORTHOPAEDIC CONSULTATION  REQUESTING PHYSICIAN: Fritzi Mandes, MD  Chief Complaint:   Right hip pain.  History of Present Illness: Gloria [name] is a 86 y.o. female with a history of hypertension, rheumatoid arthritis, and a thoracic aortic aneurysm recently stented who lives independently at [facility]  Apparently, the patient was in her usual state of health last evening when she got up to go the bathroom then tripped and fell, injuring her right hip.  She was brought to the emergency room where x-rays demonstrated a displaced right femoral neck fracture.  The patient did strike her head when she fell but a CT scan was unremarkable for any acute pathology.  The patient denies any lightheadedness, dizziness, chest pain, shortness of breath, or other symptoms which may have precipitated her fall.  Past Medical History:  Diagnosis Date  . Arthritis   . Collagen vascular disease (HCC)    RA  . Heartburn   . Hypertension    Past Surgical History:  Procedure Laterality Date  . ABDOMINAL HYSTERECTOMY    . BREAST CYST EXCISION    . THORACIC AORTIC ENDOVASCULAR STENT GRAFT N/A 04/11/2019   Procedure: THORACIC AORTIC ENDOVASCULAR STENT GRAFT insertion;  Surgeon: Serafina Mitchell, MD;  Location: [facility] OR;  Service: Vascular;  Laterality: N/A;  . ULTRASOUND GUIDANCE FOR VASCULAR ACCESS Right 04/11/2019   Procedure: Ultrasound Guidance For Vascular Access, right femoral artery;  Surgeon: Serafina Mitchell, MD;  Location: [facility] OR;  Service: Vascular;  Laterality: Right;   Social History   Socioeconomic History  . Marital status: Widowed    Spouse name: Not on file  . Number of children: Not on file  . Years of education: Not on file  . Highest education level: Not on file  Occupational History  . Not on file  Tobacco Use  . Smoking status: Never Smoker  . Smokeless tobacco: Never Used  Vaping Use  . Vaping Use: Never used   Substance and Sexual Activity  . Alcohol use: No    Alcohol/week: 0.0 standard drinks  . Drug use: Never  . Sexual activity: Not on file  Other Topics Concern  . Not on file  Social History Narrative  . Not on file   Social Determinants of Health   Financial Resource Strain:   . Difficulty of Paying Living Expenses: Not on file  Food Insecurity:   . Worried About Charity fundraiser in the Last Year: Not on file  . Ran Out of Food in the Last Year: Not on file  Transportation Needs:   . Lack of Transportation (Medical): Not on file  . Lack of Transportation (Non-Medical): Not on file  Physical Activity:   . Days of Exercise per Week: Not on file  . Minutes of Exercise per Session: Not on file  Stress:   . Feeling of Stress : Not on file  Social Connections:   . Frequency of Communication with Friends and Family: Not on file  . Frequency of Social Gatherings with Friends and Family: Not on file  . Attends Religious Services: Not on file  . Active Member of Clubs or Organizations: Not on file  . Attends Archivist Meetings: Not on file  . Marital Status: Not on file   Family History  Problem Relation Age of Onset  . Kidney disease Neg Hx   . Bladder Cancer Neg Hx    Allergies  Allergen Reactions  . Codeine Nausea Only  . Ibandronate Sodium   .  Ibandronate Sodium  [Ibandronic Acid] Other (See Comments)    Gi upset  . Risedronate Other (See Comments)    Gi upset  . Risedronate Sodium   . Sulfa Antibiotics Hives   Prior to Admission medications   Medication Sig Start Date End Date Taking? Authorizing Provider  amLODipine (NORVASC) 5 MG tablet Take 5 mg by mouth daily. 11/06/19  Yes [provider]  aspirin EC 81 MG EC tablet Take 1 tablet (81 mg total) by mouth daily at 6 (six) AM. 04/13/19  Yes Aslam, Sadia, MD  atorvastatin (LIPITOR) 20 MG tablet Take 1 tablet (20 mg total) by mouth daily at 6 PM. 04/12/19 11/30/19 Yes Aslam, Loralyn Freshwater, MD  carvedilol  (COREG) 6.25 MG tablet Take 1 tablet (6.25 mg total) by mouth 2 (two) times daily with a meal. 04/12/19 11/30/19 Yes Aslam, Sadia, MD  folic acid (FOLVITE) 1 MG tablet TAKE 1 TABLET EVERY DAY 03/28/14  Yes [provider]  levothyroxine (SYNTHROID, LEVOTHROID) 75 MCG tablet Take 75 mcg by mouth daily.  02/01/14  Yes [provider]  methotrexate (RHEUMATREX) 2.5 MG tablet TAKE 6 TABLETS (15 MG TOTAL) BY MOUTH EVERY 7 (SEVEN) DAYS FOR 168 DAYS 02/23/19  Yes [provider]  Multiple Vitamin (MULTIVITAMIN WITH MINERALS) TABS tablet Take 1 tablet by mouth daily.   Yes [provider]  predniSONE (DELTASONE) 5 MG tablet Take 2.5 mg by mouth daily. 02/23/19  Yes [provider]  traMADol (ULTRAM) 50 MG tablet Take 50 mg by mouth 2 (two) times daily as needed. 02/19/19  Yes [provider]  azelastine (ASTELIN) 0.1 % nasal spray Place into the nose. 05/08/19 05/07/20  [provider]   DG Chest 1 View  Result Date: 11/30/2019 CLINICAL DATA:  Fall from standing.  Right hip fracture EXAM: CHEST  1 VIEW COMPARISON:  04/11/2019 FINDINGS: Mild cardiomegaly that is chronic. Aortic stent graft. There is no edema, consolidation, effusion, or pneumothorax. IMPRESSION: No evidence of active disease. Electronically Signed   By: Monte Fantasia M.D.   On: 11/30/2019 04:15   DG Shoulder Right  Result Date: 11/30/2019 CLINICAL DATA:  Status post fall.  Pain EXAM: RIGHT SHOULDER - 2+ VIEW COMPARISON:  04/19/2012 FINDINGS: The bones appear osteopenic. No acute fracture or subluxation identified. Degenerative changes are noted at the [facility] joint and glenohumeral joint. IMPRESSION: 1. No acute findings. 2. AC joint and glenohumeral joint osteoarthritis. Electronically Signed   By: Kerby Moors M.D.   On: 11/30/2019 06:41   CT Head Wo Contrast  Result Date: 11/30/2019 CLINICAL DATA:  Fall with right hip fracture. EXAM: CT HEAD WITHOUT CONTRAST CT CERVICAL SPINE  WITHOUT CONTRAST TECHNIQUE: Multidetector CT imaging of the head and cervical spine was performed following the standard protocol without intravenous contrast. Multiplanar CT image reconstructions of the cervical spine were also generated. COMPARISON:  None. FINDINGS: CT HEAD FINDINGS Brain: No evidence of acute infarction, hemorrhage, hydrocephalus, extra-axial collection or mass lesion/mass effect. Age normal appearance of the brain Vascular: No hyperdense vessel or unexpected calcification. Skull: Negative for fracture Sinuses/Orbits: Bilateral cataract resection.  No evidence of injury CT CERVICAL SPINE FINDINGS Alignment: No traumatic malalignment. Trace degenerative anterolisthesis at T1-2 and T2-3. Skull base and vertebrae: No acute fracture Soft tissues and spinal canal: No prevertebral fluid or swelling. No visible canal hematoma. Disc levels: Disc narrowing with endplate and uncovertebral ridging. Upper chest: Negative IMPRESSION: No evidence of acute intracranial or cervical spine injury. Electronically Signed   By: Angelica Chessman  Watts M.D.   On: 11/30/2019 05:14   CT Cervical Spine Wo Contrast  Result Date: 11/30/2019 CLINICAL DATA:  Fall with right hip fracture. EXAM: CT HEAD WITHOUT CONTRAST CT CERVICAL SPINE WITHOUT CONTRAST TECHNIQUE: Multidetector CT imaging of the head and cervical spine was performed following the standard protocol without intravenous contrast. Multiplanar CT image reconstructions of the cervical spine were also generated. COMPARISON:  None. FINDINGS: CT HEAD FINDINGS Brain: No evidence of acute infarction, hemorrhage, hydrocephalus, extra-axial collection or mass lesion/mass effect. Age normal appearance of the brain Vascular: No hyperdense vessel or unexpected calcification. Skull: Negative for fracture Sinuses/Orbits: Bilateral cataract resection.  No evidence of injury CT CERVICAL SPINE FINDINGS Alignment: No traumatic malalignment. Trace degenerative anterolisthesis at T1-2  and T2-3. Skull base and vertebrae: No acute fracture Soft tissues and spinal canal: No prevertebral fluid or swelling. No visible canal hematoma. Disc levels: Disc narrowing with endplate and uncovertebral ridging. Upper chest: Negative IMPRESSION: No evidence of acute intracranial or cervical spine injury. Electronically Signed   By: Monte Fantasia M.D.   On: 11/30/2019 05:14   DG Hip Unilat  With Pelvis 2-3 Views Right  Result Date: 11/30/2019 CLINICAL DATA:  Fall with right hip pain EXAM: DG HIP (WITH OR WITHOUT PELVIS) 2-3V RIGHT COMPARISON:  None. FINDINGS: Right femoral transcervical neck fracture with lateral displacement. No notable acetabular degenerative changes. Intact and located left hip. Negative pelvic ring IMPRESSION: Displaced right femoral neck fracture. Electronically Signed   By: Monte Fantasia M.D.   On: 11/30/2019 04:18    Positive ROS: All other systems have been reviewed and were otherwise negative with the exception of those mentioned in the HPI and as above.  Physical Exam: General:  Alert, no acute distress Psychiatric:  Patient is competent for consent with normal mood and affect   Cardiovascular:  No pedal edema Respiratory:  No wheezing, non-labored breathing GI:  Abdomen is soft and non-tender Skin:  No lesions in the area of chief complaint Neurologic:  Sensation intact distally Lymphatic:  No axillary or cervical lymphadenopathy  Orthopedic Exam:  Orthopedic examination is limited to the right hip and lower extremity.  The right lower extremity is somewhat shortened and externally rotated as compared to the left.  Skin inspection around the right hip is unremarkable.  No swelling, erythema, ecchymosis, abrasions, or other skin abnormalities are identified.  She has mild tenderness to palpation of the lateral aspect of the right hip.  She has more severe pain with any attempted active or passive motion of the hip.  She is neurovascularly intact to the right  lower extremity as she is able to dorsiflex and plantarflex her toes and ankle.  She has good capillary refill to her right foot.  X-rays:  X-rays of the pelvis and right hip are available for review and have been reviewed by myself.  These films demonstrate a displaced right femoral neck fracture.  No significant degenerative changes of the right hip are noted.  No lytic lesions or other acute bony abnormalities are identified.  Assessment: Closed displaced right femoral neck fracture.  Plan: The treatment options have been discussed with the patient and her daughter who is at the bedside, including both surgical and nonsurgical choices.  The patient and her daughter would like to proceed with surgical intervention to include a right hip hemiarthroplasty.  This procedure has been discussed in detail, as have the potential risks (including bleeding, infection, nerve and blood vessel injury, persistent or recurrent pain, loosening or  failure of the components, dislocation, leg length inequality, need for further surgery, blood clots, strokes, heart attacks and/or arrhythmias, etc.) and benefits.  The patient and her daughter state their understanding and wish to proceed.  A formal written consent has been obtained.  Thank you for asking me to participate in the care of this most pleasant woman.  I will be happy to follow her with you.   Pascal Lux, MD  Beeper #:  [phone]  11/30/2019 8:10 AM

## 2019-11-30 NOTE — ED Notes (Signed)
Family (daughter) at bedside.

## 2019-11-30 NOTE — Transfer of Care (Signed)
Immediate Anesthesia Transfer of Care Note  Patient: Gloria [name]  Procedure(s) Performed: ARTHROPLASTY BIPOLAR HIP (HEMIARTHROPLASTY) (Right Hip)  Patient Location: PACU  Anesthesia Type:Spinal  Level of Consciousness: drowsy  Airway & Oxygen Therapy: Patient Spontanous Breathing and Patient connected to face mask oxygen  Post-op Assessment: Report given to RN and Post -op Vital signs reviewed and stable  Post vital signs: Reviewed and stable  Last Vitals:  Vitals Value Taken Time  BP 97/59 11/30/19 1420  Temp 36.3 C 11/30/19 1421  Pulse 49 11/30/19 1424  Resp 16 11/30/19 1425  SpO2 100 % 11/30/19 1424  Vitals shown include unvalidated device data.  Last Pain:  Vitals:   11/30/19 1421  TempSrc:   PainSc: (P) Asleep         Complications: No complications documented.

## 2019-11-30 NOTE — Anesthesia Preprocedure Evaluation (Addendum)
Anesthesia Evaluation  Patient identified by MRN, date of birth, ID band Patient awake  General Assessment Comment:Type B intramural hematoma of thoracic aorta  Reviewed: Allergy & Precautions, NPO status , Patient's Chart, lab work & pertinent test results  Airway Mallampati: II  TM Distance: >3 FB Neck ROM: Full    Dental no notable dental hx.    Pulmonary neg pulmonary ROS,    Pulmonary exam normal breath sounds clear to auscultation       Cardiovascular hypertension, + Peripheral Vascular Disease  Normal cardiovascular exam Rhythm:Regular Rate:Normal  Left Ventricle: Left ventricular ejection fraction, by visual estimation, is 60 to 65%. The left ventricle has normal function. The left ventricle has no regional wall motion abnormalities. There is no left ventricular hypertrophy. Left ventricular  diastolic parameters are indeterminate.  Right Ventricle: The right ventricular size is normal. No increase in right ventricular wall thickness. Global RV systolic function is has normal systolic function. The tricuspid regurgitant velocity is 2.47 m/s, and with an assumed right atrial pressure  of 3 mmHg, the estimated right ventricular systolic pressure is normal at 27.4 mmHg.  Left Atrium: Left atrial size was severely dilated.  Right Atrium: Right atrial size was mildly dilated  Pericardium: There is no evidence of pericardial effusion.  Mitral Valve: The mitral valve is normal in structure. Trivial mitral valve regurgitation. No evidence of mitral valve stenosis by observation.  Tricuspid Valve: The tricuspid valve is normal in structure. Tricuspid valve regurgitation is mild.  Aortic Valve: The aortic valve is normal in structure. Aortic valve regurgitation is not visualized. Aortic valve mean gradient measures 17.0 mmHg. Aortic valve peak gradient measures 24.1 mmHg. Aortic valve area, by VTI measures 1.81  cm.  Pulmonic Valve: The pulmonic valve was normal in structure. Pulmonic valve regurgitation is not visualized. Pulmonic regurgitation is not visualized.  Aorta: Aortic dilatation noted. There is moderate dilatation of the ascending aorta measuring 47 mm   Neuro/Psych negative neurological ROS  negative psych ROS   GI/Hepatic Neg liver ROS, GERD  ,  Endo/Other  Hypothyroidism   Renal/GU negative Renal ROS  negative genitourinary   Musculoskeletal  (+) Arthritis ,   Abdominal   Peds negative pediatric ROS (+)  Hematology negative hematology ROS (+) anemia ,   Anesthesia Other Findings   Reproductive/Obstetrics negative OB ROS                             Anesthesia Physical  Anesthesia Plan  ASA: III  Anesthesia Plan: Spinal   Post-op Pain Management:    Induction: Intravenous  PONV Risk Score and Plan:   Airway Management Planned: Nasal Cannula  Additional Equipment:   Intra-op Plan:   Post-operative Plan:   Informed Consent: I have reviewed the patients History and Physical, chart, labs and discussed the procedure including the risks, benefits and alternatives for the proposed anesthesia with the patient or authorized representative who has indicated his/her understanding and acceptance.     Dental advisory given  Plan Discussed with: CRNA and Surgeon  Anesthesia Plan Comments:        Anesthesia Quick Evaluation

## 2019-11-30 NOTE — H&P (Signed)
[facility] [place] NAME: Gloria [name]    MR#:  [id number]  DATE OF BIRTH:  [birth year]  DATE OF ADMISSION:  11/30/2019  PRIMARY CARE PHYSICIAN: Adin Hector, MD   REQUESTING/REFERRING PHYSICIAN: dr Karma Greaser  Patient coming from : home  Independent with ADLs. Daughter present in the ER CHIEF COMPLAINT:  right hip pain after fall at home  HISTORY OF PRESENT ILLNESS:  Gloria [name]  is a 86 y.o. female with a known history of rheumatoid arthritis, hypertension, hyperlipidemia, Gerd, DJD comes to the emergency room after mechanical fall at home. Patient woke up around 2:30 AM to use the bathroom had accidental fall started having right hip pain.  Denies chest pain, shortness of breath. Per daughter no cardiac history. Patient able to climb flight of stairs without any symptoms.  ED course: afebrile heart rate 57 to 68, blood pressure 150/66, sats 95 to 98% on room air  x-ray right shoulder 1. No acute findings. 2. AC joint and glenohumeral joint osteoarthritis  X-ray pelvis and right hip Displaced right femoral neck fracture. CT head and cervical spine No evidence of acute intracranial or cervical spine injury.  Patient is being admitted for right hip fracture. Orthopedic consultation obtained with Dr.poggi PAST MEDICAL HISTORY:   Past Medical History:  Diagnosis Date  . Arthritis   . Collagen vascular disease (HCC)    RA  . Heartburn   . Hypertension     PAST SURGICAL HISTOIRY:   Past Surgical History:  Procedure Laterality Date  . ABDOMINAL HYSTERECTOMY    . BREAST CYST EXCISION    . THORACIC AORTIC ENDOVASCULAR STENT GRAFT N/A 04/11/2019   Procedure: THORACIC AORTIC ENDOVASCULAR STENT GRAFT insertion;  Surgeon: Serafina Mitchell, MD;  Location: [facility] OR;  Service: Vascular;  Laterality: N/A;  . ULTRASOUND GUIDANCE FOR VASCULAR ACCESS Right 04/11/2019   Procedure: Ultrasound Guidance For Vascular Access, right  femoral artery;  Surgeon: Serafina Mitchell, MD;  Location: [facility];  Service: Vascular;  Laterality: Right;    SOCIAL HISTORY:   Social History   Tobacco Use  . Smoking status: Never Smoker  . Smokeless tobacco: Never Used  Substance Use Topics  . Alcohol use: No    Alcohol/week: 0.0 standard drinks    FAMILY HISTORY:   Family History  Problem Relation Age of Onset  . Kidney disease Neg Hx   . Bladder Cancer Neg Hx     DRUG ALLERGIES:   Allergies  Allergen Reactions  . Codeine Nausea Only  . Ibandronate Sodium   . Ibandronate Sodium  [Ibandronic Acid] Other (See Comments)    Gi upset  . Risedronate Other (See Comments)    Gi upset  . Risedronate Sodium   . Sulfa Antibiotics Hives    REVIEW OF SYSTEMS:  Review of Systems  Constitutional: Negative for chills, fever and weight loss.  HENT: Negative for ear discharge, ear pain and nosebleeds.   Eyes: Negative for blurred vision, pain and discharge.  Respiratory: Negative for sputum production, shortness of breath, wheezing and stridor.   Cardiovascular: Negative for chest pain, palpitations, orthopnea and PND.  Gastrointestinal: Negative for abdominal pain, diarrhea, nausea and vomiting.  Genitourinary: Negative for frequency and urgency.  Musculoskeletal: Positive for falls and joint pain. Negative for back pain.  Neurological: Positive for weakness. Negative for sensory change, speech change and focal weakness.  Psychiatric/Behavioral: Negative for depression and hallucinations. The patient is not nervous/anxious.  MEDICATIONS AT HOME:   Prior to Admission medications   Medication Sig Start Date End Date Taking? Authorizing Provider  amLODipine (NORVASC) 5 MG tablet Take 5 mg by mouth daily. 11/06/19  Yes [provider]  aspirin EC 81 MG EC tablet Take 1 tablet (81 mg total) by mouth daily at 6 (six) AM. 04/13/19  Yes Aslam, Sadia, MD  atorvastatin (LIPITOR) 20 MG tablet Take 1 tablet (20 mg total) by  mouth daily at 6 PM. 04/12/19 11/30/19 Yes Aslam, Loralyn Freshwater, MD  carvedilol (COREG) 6.25 MG tablet Take 1 tablet (6.25 mg total) by mouth 2 (two) times daily with a meal. 04/12/19 11/30/19 Yes Aslam, Sadia, MD  folic acid (FOLVITE) 1 MG tablet TAKE 1 TABLET EVERY DAY 03/28/14  Yes [provider]  levothyroxine (SYNTHROID, LEVOTHROID) 75 MCG tablet Take 75 mcg by mouth daily.  02/01/14  Yes [provider]  methotrexate (RHEUMATREX) 2.5 MG tablet TAKE 6 TABLETS (15 MG TOTAL) BY MOUTH EVERY 7 (SEVEN) DAYS FOR 168 DAYS 02/23/19  Yes [provider]  Multiple Vitamin (MULTIVITAMIN WITH MINERALS) TABS tablet Take 1 tablet by mouth daily.   Yes [provider]  predniSONE (DELTASONE) 5 MG tablet Take 2.5 mg by mouth daily. 02/23/19  Yes [provider]  traMADol (ULTRAM) 50 MG tablet Take 50 mg by mouth 2 (two) times daily as needed. 02/19/19  Yes [provider]  azelastine (ASTELIN) 0.1 % nasal spray Place into the nose. 05/08/19 05/07/20  [provider]      VITAL SIGNS:  Blood pressure (!) 153/62, pulse 68, temperature (!) 97.4 F (36.3 C), temperature source Oral, resp. rate 16, height 5\' 6"  (1.676 m), weight 72.6 kg, SpO2 95 %.  PHYSICAL EXAMINATION:  GENERAL:  86 y.o.-year-old patient lying in the bed with acute distress right hip pain EYES: Pupils equal, round, reactive to light and accommodation. No scleral icterus.  HEENT: Head atraumatic, normocephalic. Oropharynx and nasopharynx clear.  NECK:  Supple, no jugular venous distention. No thyroid enlargement, no tenderness.  LUNGS: Normal breath sounds bilaterally, no wheezing, rales,rhonchi or crepitation. No use of accessory muscles of respiration.  CARDIOVASCULAR: S1, S2 normal. No murmurs, rubs, or gallops.  ABDOMEN: Soft, nontender, nondistended. Bowel sounds present. No organomegaly or mass.  EXTREMITIES: right lower extremity shortened and rotated. NEUROLOGIC: grossly nonfocal  PSYCHIATRIC: The patient is alert and oriented x 3.  SKIN: No obvious rash, lesion, or ulcer.   LABORATORY PANEL:   CBC Recent Labs  Lab 11/30/19 0336  WBC 8.2  HGB 12.0  HCT 35.4*  PLT 180   ------------------------------------------------------------------------------------------------------------------  Chemistries  Recent Labs  Lab 11/30/19 0336  NA 142  K 3.6  CL 107  CO2 26  GLUCOSE 93  BUN 17  CREATININE 0.63  CALCIUM 8.8*   ------------------------------------------------------------------------------------------------------------------  Cardiac Enzymes No results for input(s): TROPONINI in the last 168 hours. ------------------------------------------------------------------------------------------------------------------  RADIOLOGY:  DG Chest 1 View  Result Date: 11/30/2019 CLINICAL DATA:  Fall from standing.  Right hip fracture EXAM: CHEST  1 VIEW COMPARISON:  04/11/2019 FINDINGS: Mild cardiomegaly that is chronic. Aortic stent graft. There is no edema, consolidation, effusion, or pneumothorax. IMPRESSION: No evidence of active disease. Electronically Signed   By: Monte Fantasia M.D.   On: 11/30/2019 04:15   DG Shoulder Right  Result Date: 11/30/2019 CLINICAL DATA:  Status post fall.  Pain EXAM: RIGHT SHOULDER - 2+ VIEW COMPARISON:  04/19/2012 FINDINGS: The bones appear osteopenic. No acute fracture or subluxation identified. Degenerative  changes are noted at the [facility] joint and glenohumeral joint. IMPRESSION: 1. No acute findings. 2. AC joint and glenohumeral joint osteoarthritis. Electronically Signed   By: Kerby Moors M.D.   On: 11/30/2019 06:41   CT Head Wo Contrast  Result Date: 11/30/2019 CLINICAL DATA:  Fall with right hip fracture. EXAM: CT HEAD WITHOUT CONTRAST CT CERVICAL SPINE WITHOUT CONTRAST TECHNIQUE: Multidetector CT imaging of the head and cervical spine was performed following the standard protocol without intravenous contrast. Multiplanar CT  image reconstructions of the cervical spine were also generated. COMPARISON:  None. FINDINGS: CT HEAD FINDINGS Brain: No evidence of acute infarction, hemorrhage, hydrocephalus, extra-axial collection or mass lesion/mass effect. Age normal appearance of the brain Vascular: No hyperdense vessel or unexpected calcification. Skull: Negative for fracture Sinuses/Orbits: Bilateral cataract resection.  No evidence of injury CT CERVICAL SPINE FINDINGS Alignment: No traumatic malalignment. Trace degenerative anterolisthesis at T1-2 and T2-3. Skull base and vertebrae: No acute fracture Soft tissues and spinal canal: No prevertebral fluid or swelling. No visible canal hematoma. Disc levels: Disc narrowing with endplate and uncovertebral ridging. Upper chest: Negative IMPRESSION: No evidence of acute intracranial or cervical spine injury. Electronically Signed   By: Monte Fantasia M.D.   On: 11/30/2019 05:14   CT Cervical Spine Wo Contrast  Result Date: 11/30/2019 CLINICAL DATA:  Fall with right hip fracture. EXAM: CT HEAD WITHOUT CONTRAST CT CERVICAL SPINE WITHOUT CONTRAST TECHNIQUE: Multidetector CT imaging of the head and cervical spine was performed following the standard protocol without intravenous contrast. Multiplanar CT image reconstructions of the cervical spine were also generated. COMPARISON:  None. FINDINGS: CT HEAD FINDINGS Brain: No evidence of acute infarction, hemorrhage, hydrocephalus, extra-axial collection or mass lesion/mass effect. Age normal appearance of the brain Vascular: No hyperdense vessel or unexpected calcification. Skull: Negative for fracture Sinuses/Orbits: Bilateral cataract resection.  No evidence of injury CT CERVICAL SPINE FINDINGS Alignment: No traumatic malalignment. Trace degenerative anterolisthesis at T1-2 and T2-3. Skull base and vertebrae: No acute fracture Soft tissues and spinal canal: No prevertebral fluid or swelling. No visible canal hematoma. Disc levels: Disc narrowing  with endplate and uncovertebral ridging. Upper chest: Negative IMPRESSION: No evidence of acute intracranial or cervical spine injury. Electronically Signed   By: Monte Fantasia M.D.   On: 11/30/2019 05:14   DG Hip Unilat  With Pelvis 2-3 Views Right  Result Date: 11/30/2019 CLINICAL DATA:  Fall with right hip pain EXAM: DG HIP (WITH OR WITHOUT PELVIS) 2-3V RIGHT COMPARISON:  None. FINDINGS: Right femoral transcervical neck fracture with lateral displacement. No notable acetabular degenerative changes. Intact and located left hip. Negative pelvic ring IMPRESSION: Displaced right femoral neck fracture. Electronically Signed   By: Monte Fantasia M.D.   On: 11/30/2019 04:18    EKG:   Normal sinus rhythm, prolonged PR IMPRESSION AND PLAN:  Gloria [name]  is a 86 y.o. female with a known history of rheumatoid arthritis, hypertension, hyperlipidemia, Gerd, DJD comes to the emergency room after mechanical fall at home. Patient woke up around 2:30 AM to use the bathroom had accidental fall started having right hip pain.  1. preoperative evaluation for right hip fracture -patient came in after mechanical fall at home -no cardiac history per daughter -EKG shows sinus rhythm with PR prolongation. No acute ST elevation her depression and-no chest pain shortness of breath -patient is at a low to intermediate risk for surgery. This was discussed with patient and daughter. -Continue beta-blocker peri-operatively -start DVT prophylaxis when okay with orthopedic -  PRN pain meds -IV fluids -NPO  2. hypertension -continue amlodipine, Coreg  3. History of rheumatoid arthritis follows with Dr. Jefm Bryant -resume methotrexate, prednisone, folic acid after surgery  4. Hypothyroidism -continue Synthroid  5. Hyperlipidemia -continue statins  PT, OT, TOC consultation  Family Communication : daughter in the emergency room Consults : orthopedic Dr. Roland Rack Code Status : DNR prior to admission--  confirm with daughter DVT prophylaxis : SCD for now pending hip surgery  TOTAL TIME TAKING CARE OF THIS PATIENT: **55* minutes.    Fritzi Mandes M.D  [facility]     CC: Primary care physician; Adin Hector, MD

## 2019-11-30 NOTE — Anesthesia Procedure Notes (Signed)
Spinal  Patient location during procedure: OR Start time: 11/30/2019 12:24 PM End time: 11/30/2019 12:33 PM Staffing Performed: resident/CRNA  Anesthesiologist: Alvin Critchley, MD Resident/CRNA: Caryl Asp, CRNA Preanesthetic Checklist Completed: patient identified, IV checked, site marked, risks and benefits discussed, surgical consent, monitors and equipment checked, pre-op evaluation and timeout performed Spinal Block Patient position: sitting Prep: Betadine Patient monitoring: heart rate, continuous pulse ox, blood pressure and cardiac monitor Approach: midline Location: L4-5 Injection technique: single-shot Needle Needle type: Whitacre and Introducer  Needle gauge: 24 G Needle length: 9 cm Additional Notes Negative paresthesia. Negative blood return. Positive free-flowing CSF. Expiration date of kit checked and confirmed. Patient tolerated procedure well, without complications.

## 2019-11-30 NOTE — ED Provider Notes (Signed)
[facility] Emergency Department Provider Note  ____________________________________________   First MD Initiated Contact with Patient 11/30/19 [phone]     (approximate)  I have reviewed the triage vital signs and the nursing notes.   HISTORY  Chief Complaint Fall    HPI Gloria [name] [name] is a 86 y.o. female with medical history as listed below who presents by EMS for evaluation of pain in her right hip after a fall.   She got up to go the bathroom and tripped on her way to her from the bathroom.  She has minimal discomfort at rest but severe pain with any movement.  She received some pain medication on the way to the hospital by EMS but I am not certain what it was or how much she received.  She is currently not in distress.  She also hit the right side of her head and ear when she fell and she has a generalized headache that is mild.  She has no neck pain.  She denies chest pain, shortness of breath, nausea, vomiting, and abdominal pain.  The episode was acute and relatively severe.    She does not take any blood thinners other than a daily baby aspirin.  She is fully immunized against COVID-19.        Past Medical History:  Diagnosis Date  . Arthritis   . Collagen vascular disease (HCC)    RA  . Heartburn   . Hypertension     Patient Active Problem List   Diagnosis Date Noted  . Closed right hip fracture ([place]) 11/30/2019  . Intramural hematoma of thoracic aorta ([place]) 04/08/2019  . Thoracic ascending aortic aneurysm ([place]) 04/07/2019  . Anemia 02/14/2018  . Peripheral vascular disease ([place]) 09/14/2017  . Aneurysm of ascending aorta (HCC) 02/14/2015  . Essential hypertension 01/21/2014  . HYPOTHYROIDISM 02/01/2007  . GERD 02/01/2007  . ARTHRITIS, RHEUMATOID 02/01/2007  . OSTEOPENIA 02/01/2007  . COLONIC POLYPS, HX OF 02/01/2007  . DISEASE, CELIAC 04/05/2002  . POLYMYALGIA RHEUMATICA 04/05/2001    Past Surgical History:  Procedure Laterality  Date  . ABDOMINAL HYSTERECTOMY    . BREAST CYST EXCISION    . THORACIC AORTIC ENDOVASCULAR STENT GRAFT N/A 04/11/2019   Procedure: THORACIC AORTIC ENDOVASCULAR STENT GRAFT insertion;  Surgeon: Serafina Mitchell, MD;  Location: [facility] OR;  Service: Vascular;  Laterality: N/A;  . ULTRASOUND GUIDANCE FOR VASCULAR ACCESS Right 04/11/2019   Procedure: Ultrasound Guidance For Vascular Access, right femoral artery;  Surgeon: Serafina Mitchell, MD;  Location: [facility] OR;  Service: Vascular;  Laterality: Right;    Prior to Admission medications   Medication Sig Start Date End Date Taking? Authorizing Provider  amLODipine (NORVASC) 5 MG tablet Take 5 mg by mouth daily. 11/06/19  Yes [provider]  aspirin EC 81 MG EC tablet Take 1 tablet (81 mg total) by mouth daily at 6 (six) AM. 04/13/19  Yes Aslam, Sadia, MD  atorvastatin (LIPITOR) 20 MG tablet Take 1 tablet (20 mg total) by mouth daily at 6 PM. 04/12/19 11/30/19 Yes Aslam, Loralyn [place], MD  carvedilol (COREG) 6.25 MG tablet Take 1 tablet (6.25 mg total) by mouth 2 (two) times daily with a meal. 04/12/19 11/30/19 Yes Aslam, Sadia, MD  folic acid (FOLVITE) 1 MG tablet TAKE 1 TABLET EVERY DAY 03/28/14  Yes [provider]  levothyroxine (SYNTHROID, LEVOTHROID) 75 MCG tablet Take 75 mcg by mouth daily.  02/01/14  Yes [provider]  methotrexate (RHEUMATREX) 2.5 MG tablet TAKE  6 TABLETS (15 MG TOTAL) BY MOUTH EVERY 7 (SEVEN) DAYS FOR 168 DAYS 02/23/19  Yes [provider]  Multiple Vitamin (MULTIVITAMIN WITH MINERALS) TABS tablet Take 1 tablet by mouth daily.   Yes [provider]  predniSONE (DELTASONE) 5 MG tablet Take 2.5 mg by mouth daily. 02/23/19  Yes [provider]  traMADol (ULTRAM) 50 MG tablet Take 50 mg by mouth 2 (two) times daily as needed. 02/19/19  Yes [provider]  azelastine (ASTELIN) 0.1 % nasal spray Place into the nose. 05/08/19 05/07/20  [provider]  venlafaxine XR (EFFEXOR-XR) 37.5 MG  24 hr capsule Take 37.5 mg by mouth daily. Patient not taking: Reported on 11/30/2019 10/29/19   [provider]    Allergies Codeine, Ibandronate sodium, Ibandronate sodium  [ibandronic acid], Risedronate, Risedronate sodium, and Sulfa antibiotics  Family History  Problem Relation Age of Onset  . Kidney disease Neg Hx   . Bladder Cancer Neg Hx     Social History Social History   Tobacco Use  . Smoking status: Never Smoker  . Smokeless tobacco: Never Used  Vaping Use  . Vaping Use: Never used  Substance Use Topics  . Alcohol use: No    Alcohol/week: 0.0 standard drinks  . Drug use: Never    Review of Systems Constitutional: No fever/chills Eyes: No visual changes. ENT: No sore throat. Cardiovascular: Denies chest pain. Respiratory: Denies shortness of breath. Gastrointestinal: No abdominal pain.  No nausea, no vomiting.  No diarrhea.  No constipation. Genitourinary: Negative for dysuria. Musculoskeletal: Pain in right hip after fall.  Some swelling to her right ear. Integumentary: Small laceration to right ear. Neurological: Mild generalized headache.  Negative for focal weakness or numbness.   ____________________________________________   PHYSICAL EXAM:  VITAL SIGNS: ED Triage Vitals  Enc Vitals Group     BP 11/30/19 0318 (!) 168/84     Pulse Rate 11/30/19 0318 62     Resp 11/30/19 0318 16     Temp 11/30/19 0318 (!) 97.4 F (36.3 C)     Temp Source 11/30/19 0318 Oral     SpO2 11/30/19 0318 99 %     Weight 11/30/19 0319 72.6 kg (160 lb)     Height 11/30/19 0319 1.676 m (5\' 6" )     Head Circumference --      Peak Flow --      Pain Score 11/30/19 0318 10     Pain Loc --      Pain Edu? --      Excl. in [place]? --     Constitutional: Alert and oriented.  No acute distress at rest. Eyes: Conjunctivae are normal.  Head: There is evidence of contusion on her right ear in the form of some very mild edema and ecchymosis with a small and very superficial  laceration of the concha.  No indication for glue or suture repair.  No evidence of expanding hematoma that would require incision and drainage. Nose: No congestion/rhinnorhea. Mouth/Throat: Patient is wearing a mask. Neck: No stridor.  No meningeal signs.   Cardiovascular: Normal rate, regular rhythm. Good peripheral circulation. Grossly normal heart sounds. Respiratory: Normal respiratory effort.  No retractions. Gastrointestinal: Soft and nontender. No distention.  Musculoskeletal: No gross deformity of the right hip but with some mild shortening and slight external rotation.  Pain with passive range of motion, no pain at rest. Neurologic:  Normal speech and language. No gross focal neurologic deficits are appreciated.  Skin:  Skin is  warm, dry and intact. Psychiatric: Mood and affect are normal. Speech and behavior are normal.  ____________________________________________   LABS (all labs ordered are listed, but only abnormal results are displayed)  Labs Reviewed  BASIC METABOLIC PANEL - Abnormal; Notable for the following components:      Result Value   Calcium 8.8 (*)    All other components within normal limits  CBC WITH DIFFERENTIAL/PLATELET - Abnormal; Notable for the following components:   RBC 3.83 (*)    HCT 35.4 (*)    RDW 16.5 (*)    Monocytes Absolute 1.1 (*)    All other components within normal limits  SARS CORONAVIRUS 2 BY RT PCR (HOSPITAL ORDER, [facility])  APTT  PROTIME-INR  TYPE AND SCREEN  TYPE AND SCREEN   ____________________________________________  EKG  ED ECG REPORT I, Hinda Kehr, the attending physician, personally viewed and interpreted this ECG.  Date: 11/30/2019 EKG Time: 3:19 AM Rate: 63 Rhythm: Sinus rhythm with first-degree AV block QRS Axis: normal Intervals: PR interval 231 ms, otherwise unremarkable ST/T Wave abnormalities: Non-specific ST segment / T-wave changes, but no clear evidence of acute  ischemia. Narrative Interpretation: no definitive evidence of acute ischemia; does not meet STEMI criteria.   ____________________________________________  RADIOLOGY I, Hinda Kehr, personally viewed and evaluated these images (plain radiographs) as part of my medical decision making, as well as reviewing the written report by the radiologist.  ED MD interpretation: Normal chest x-ray.  Displaced right femoral neck fracture.  CT head and CT cervical spine are unremarkable with no evidence of acute injury.  Official radiology report(s): DG Chest 1 View  Result Date: 11/30/2019 CLINICAL DATA:  Fall from standing.  Right hip fracture EXAM: CHEST  1 VIEW COMPARISON:  04/11/2019 FINDINGS: Mild cardiomegaly that is chronic. Aortic stent graft. There is no edema, consolidation, effusion, or pneumothorax. IMPRESSION: No evidence of active disease. Electronically Signed   By: Monte Fantasia M.D.   On: 11/30/2019 04:15   DG Shoulder Right  Result Date: 11/30/2019 CLINICAL DATA:  Status post fall.  Pain EXAM: RIGHT SHOULDER - 2+ VIEW COMPARISON:  04/19/2012 FINDINGS: The bones appear osteopenic. No acute fracture or subluxation identified. Degenerative changes are noted at the [facility] joint and glenohumeral joint. IMPRESSION: 1. No acute findings. 2. AC joint and glenohumeral joint osteoarthritis. Electronically Signed   By: Kerby Moors M.D.   On: 11/30/2019 06:41   CT Head Wo Contrast  Result Date: 11/30/2019 CLINICAL DATA:  Fall with right hip fracture. EXAM: CT HEAD WITHOUT CONTRAST CT CERVICAL SPINE WITHOUT CONTRAST TECHNIQUE: Multidetector CT imaging of the head and cervical spine was performed following the standard protocol without intravenous contrast. Multiplanar CT image reconstructions of the cervical spine were also generated. COMPARISON:  None. FINDINGS: CT HEAD FINDINGS Brain: No evidence of acute infarction, hemorrhage, hydrocephalus, extra-axial collection or mass lesion/mass effect. Age  normal appearance of the brain Vascular: No hyperdense vessel or unexpected calcification. Skull: Negative for fracture Sinuses/Orbits: Bilateral cataract resection.  No evidence of injury CT CERVICAL SPINE FINDINGS Alignment: No traumatic malalignment. Trace degenerative anterolisthesis at T1-2 and T2-3. Skull base and vertebrae: No acute fracture Soft tissues and spinal canal: No prevertebral fluid or swelling. No visible canal hematoma. Disc levels: Disc narrowing with endplate and uncovertebral ridging. Upper chest: Negative IMPRESSION: No evidence of acute intracranial or cervical spine injury. Electronically Signed   By: Monte Fantasia M.D.   On: 11/30/2019 05:14   CT Cervical Spine Wo  Contrast  Result Date: 11/30/2019 CLINICAL DATA:  Fall with right hip fracture. EXAM: CT HEAD WITHOUT CONTRAST CT CERVICAL SPINE WITHOUT CONTRAST TECHNIQUE: Multidetector CT imaging of the head and cervical spine was performed following the standard protocol without intravenous contrast. Multiplanar CT image reconstructions of the cervical spine were also generated. COMPARISON:  None. FINDINGS: CT HEAD FINDINGS Brain: No evidence of acute infarction, hemorrhage, hydrocephalus, extra-axial collection or mass lesion/mass effect. Age normal appearance of the brain Vascular: No hyperdense vessel or unexpected calcification. Skull: Negative for fracture Sinuses/Orbits: Bilateral cataract resection.  No evidence of injury CT CERVICAL SPINE FINDINGS Alignment: No traumatic malalignment. Trace degenerative anterolisthesis at T1-2 and T2-3. Skull base and vertebrae: No acute fracture Soft tissues and spinal canal: No prevertebral fluid or swelling. No visible canal hematoma. Disc levels: Disc narrowing with endplate and uncovertebral ridging. Upper chest: Negative IMPRESSION: No evidence of acute intracranial or cervical spine injury. Electronically Signed   By: Monte Fantasia M.D.   On: 11/30/2019 05:14   DG Hip Unilat  With  Pelvis 2-3 Views Right  Result Date: 11/30/2019 CLINICAL DATA:  Fall with right hip pain EXAM: DG HIP (WITH OR WITHOUT PELVIS) 2-3V RIGHT COMPARISON:  None. FINDINGS: Right femoral transcervical neck fracture with lateral displacement. No notable acetabular degenerative changes. Intact and located left hip. Negative pelvic ring IMPRESSION: Displaced right femoral neck fracture. Electronically Signed   By: Monte Fantasia M.D.   On: 11/30/2019 04:18    ____________________________________________   PROCEDURES   Procedure(s) performed (including Critical Care):  Procedures   ____________________________________________   INITIAL IMPRESSION / MDM / [place] / ED COURSE  As part of my medical decision making, I reviewed the following data within the [place] notes reviewed and incorporated, discussed with patient and daughter, Labs reviewed , EKG interpreted , Old chart reviewed, Radiograph reviewed , Discussed with admitting physician (Dr. Sidney Ace), Discussed with orthopedic surgeon (Dr. Rae Mar) and reviewed Notes from prior ED visits   Differential diagnosis includes, but is not limited to, hip fracture, hip dislocation, intracranial bleed, electrolyte or metabolic abnormality, acute infection.  The patient is not on an anticoagulant other than a daily baby aspirin.  Basic metabolic panel and CBC are normal.  She is fully immunized against COVID-19.  Vital signs are stable.  She is alert and oriented.  No evidence of acute intracranial or cervical spine injury.  She has a displaced right femoral neck fracture and will require surgery.  I will speak with both orthopedics and the hospitalist for admission.  I discussed all the results with the patient and her daughter who is at bedside and they understand and agree with the plan.  I ordered morphine 4 mg IV as needed and Zofran 4 mg IV as needed and ordered a normal saline infusion at 100 mL/h and ask the  patient to remain n.p.o.       Clinical Course as of Nov 29 657  Fri Nov 30, 2019  0522 Discussed case with Dr. Roland Rack.  Will consult hospitalist for admission.   [CF]  [id number] Discussed case by phone with Dr. Sidney Ace.  He will admit.   [CF]    Clinical Course User Index [CF] Hinda Kehr, MD     ____________________________________________  FINAL CLINICAL IMPRESSION(S) / ED DIAGNOSES  Final diagnoses:  Closed displaced fracture of right femoral neck ([place])  Fall, initial encounter  Minor head injury, initial encounter  Acute pain of right shoulder  MEDICATIONS GIVEN DURING THIS VISIT:  Medications  morphine 4 MG/ML injection 4 mg (4 mg Intravenous Given 11/30/19 0515)  ondansetron (ZOFRAN) injection 4 mg (4 mg Intravenous Given 11/30/19 0513)  ceFAZolin (ANCEF) IVPB 2g/100 mL premix (has no administration in time range)  0.9 %  sodium chloride infusion ( Intravenous New Bag/Given 11/30/19 0514)     ED Discharge Orders    None      *Please note:  Aiya Keach was evaluated in Emergency Department on 11/30/2019 for the symptoms described in the history of present illness. She was evaluated in the context of the global COVID-19 pandemic, which necessitated consideration that the patient might be at risk for infection with the SARS-CoV-2 virus that causes COVID-19. Institutional protocols and algorithms that pertain to the evaluation of patients at risk for COVID-19 are in a state of rapid change based on information released by regulatory bodies including the CDC and federal and state organizations. These policies and algorithms were followed during the patient's care in the ED.  Some ED evaluations and interventions may be delayed as a result of limited staffing during and after the pandemic.*  Note:  This document was prepared using Dragon voice recognition software and may include unintentional dictation errors.   Hinda Kehr, MD 11/30/19 [phone]

## 2019-11-30 NOTE — ED Notes (Signed)
Coreg and robaxin given per Dr. Posey Pronto request. All other meds to be held at this time due to pending surgical procedure

## 2019-11-30 NOTE — ED Notes (Signed)
Patient transported to CT  Pt has minor lac to right ear, no sign of blood, daughter reports pt struck right ear against object during fall

## 2019-11-30 NOTE — ED Notes (Signed)
Patient transported to X-ray 

## 2019-11-30 NOTE — ED Notes (Signed)
Staff from same day surgery here to transport pt at this time

## 2019-11-30 NOTE — Op Note (Signed)
11/30/2019  2:30 PM  Patient:   Gloria [name]  Pre-Op Diagnosis:   Displaced femoral neck fracture, right hip.  Post-Op Diagnosis:   Same.  Procedure:   Right hip unipolar hemiarthroplasty.  Surgeon:   Pascal Lux, MD  Assistant:   None  Anesthesia:   Spinal  Findings:   As above.  Complications:   None  EBL:   100 cc  Fluids:   1100 cc crystalloid  UOP:   None  TT:   None  Drains:   None  Closure:   Staples  Implants:   Biomet press-fit system with a #15 laterally offset reduced proximal profile Echo femoral stem, a 46 mm outer diameter shell, and a +0 mm neck adapter.  Brief Clinical Note:   The patient is a 86 year old female who sustained the above-noted injury late last night when she apparently tripped and fell while going to the bathroom in her home. She was brought to the emergency room where x-rays demonstrated the above-noted injury. The patient has been cleared medically and presents at this time for definitive management of the injury.  Procedure:   The patient was brought into the operating room. After adequate spinal anesthesia was obtained, the patient was repositioned in the left lateral decubitus position and secured using a lateral hip positioner. The right hip and lower extremity were prepped with ChloroPrep solution before being draped sterilely. Preoperative antibiotics were administered. A timeout was performed to verify the appropriate surgical site before a standard posterior approach to the hip was made through an approximately 4-5 inch incision. The incision was carried down through the subcutaneous tissues to expose the gluteal fascia and proximal end of the iliotibial band. These structures were split the length of the incision and the [place] self-retaining hip retractor placed. The bursal tissues were swept posteriorly to expose the short external rotators. The anterior border of the piriformis tendon was identified and this plane developed  down through the capsule to enter the joint. Abundant fracture hematoma was suctioned. A flap of tissue was elevated off the posterior aspect of the femoral neck and greater trochanter and retracted posteriorly. This flap included the piriformis tendon, the short external rotators, and the posterior capsule. The femoral head was removed in its entirety, then taken to the back table where it was measured and found to be optimally replicated by a 46 mm head. The appropriate trial head was inserted and found to demonstrate an excellent suction fit.   Attention was directed to the femoral side. The femoral neck was recut 10-12 mm above the lesser trochanter using an oscillating saw. The piriformis fossa was debrided of soft tissues before the intramedullary canal was accessed through this point using a triple step reamer. The canal was reamed sequentially beginning with a #7 tapered reamer and progressing to a #15 tapered reamer. This provided excellent circumferential chatter. A box osteotome was used to establish version before the canal was broached sequentially beginning with a #13 broach and progressing to a #15 broach. This was left in place and several trial reductions performed. The permanent #15 laterally offset reduced proximal profile femoral stem was impacted into place. A repeat trial reduction was performed using both the -3 mm and +0 mm neck lengths. The +0 mm neck length demonstrated excellent stability both in extension and external rotation as well as with flexion to 90 and internal rotation beyond 70. It also was stable in the position of sleep. The 46 mm outer diameter shell  with the +0 mm neck adapter construct was put together on the back table before being impacted onto the stem of the femoral component. The Morse taper locking mechanism was verified using manual distraction before the head was relocated and the hip placed through a range of motion with the findings as described above.  The  wound was copiously irrigated with bacitracin saline solution via the jet lavage system before the peri-incisional and pericapsular tissues were injected with 30 cc of 0.5% Sensorcaine with epinephrine and 20 cc of Exparel diluted out to 60 cc with normal saline to help with postoperative analgesia. The posterior flap was reapproximated to the posterior aspect of the greater trochanter using #2 Ethibond interrupted sutures placed through drill holes. The iliotibial band was reapproximated using #1 Vicryl interrupted sutures before the gluteal fascia was closed using a running #1 Vicryl suture. At this point, 1 g of transexemic acid in 10 cc of normal saline was injected into the joint to help reduce postoperative bleeding. The subcutaneous tissues were closed in several layers using 2-0 Vicryl interrupted sutures before the skin was closed using staples. A sterile occlusive dressing was applied to the wound . The patient then was rolled back into the supine position on the hospital bed before being awakened and returned to the recovery room in satisfactory condition after tolerating the procedure well.

## 2019-11-30 NOTE — TOC Initial Note (Signed)
Transition of Care [facility]) - Initial/Assessment Note    Patient Details  Name: Gloria [name] MRN: [id number] Date of Birth: 21-Jul-1925  Transition of Care [facility]) CM/SW Contact:    Ova Freshwater Phone Number: [phone] 11/30/2019, 2:51 PM  Clinical Narrative:                  Patient presents to the ED due to fall. CSW explained TOC role, process and placement timeline. Patient lives at [facility], is good with ADL's and is able to get round on her own. Patient does experience incontinence w/ urine and sometimes with stool.  Patient oriented X4, daughter Ellison Hughs [phone] was in room.  Patient stated she would [facility] to go to [facility]. TOC will continue to follow.   Expected Discharge Plan: Skilled Nursing Facility Barriers to Discharge: Continued Medical Work up   Patient Goals and CMS Choice Patient states their goals for this hospitalization and ongoing recovery are:: Pateint would like to continue living in independent living. CMS Medicare.gov Compare Post Acute Care list provided to:: Other (Comment Required) (Patietn lives in [place], would like [facility])    Expected Discharge Plan and Services Expected Discharge Plan: [place] In-house Referral: Clinical Social Work     Living arrangements for the past 2 months: [place]                                      Prior Living Arrangements/Services Living arrangements for the past 2 months: [place] Lives with:: Self Patient language and need for interpreter reviewed:: Yes Do you feel safe going back to the place where you live?: Yes      Need for Family Participation in Patient Care: Yes (Comment) Care giver support system in place?: Yes (comment)   Criminal Activity/Legal Involvement Pertinent to Current Situation/Hospitalization: No - Comment as needed  Activities of Daily Living       Permission Sought/Granted Permission sought to share information with : Facility Sport and exercise psychologist    Share Information with NAME: Ellison Hughs (Daughter) [phone]  Permission granted to share info w AGENCY: [facility]        Emotional Assessment Appearance:: Appears younger than stated age Attitude/Demeanor/Rapport: Lethargic, Gracious Affect (typically observed): Pleasant, Stable Orientation: : Oriented to Self, Oriented to Place, Oriented to  Time, Oriented to Situation Alcohol / Substance Use: Not Applicable Psych Involvement: No (comment)  Admission diagnosis:  Hip fracture ([place]) [S72.009A] Fall [W19.XXXA] Closed right hip fracture ([place]) [S72.001A] Minor head injury, initial encounter [Q25.95GL] Fall, initial encounter [W19.XXXA] Acute pain of right shoulder [M25.511] Closed displaced fracture of right femoral neck ([place]) [S72.001A] Patient Active Problem List   Diagnosis Date Noted  . Closed right hip fracture ([place]) 11/30/2019  . Hip fracture ([place]) 11/30/2019  . Acute pain of right shoulder   . Fall   . Intramural hematoma of thoracic aorta ([place]) 04/08/2019  . Thoracic ascending aortic aneurysm ([place]) 04/07/2019  . Anemia 02/14/2018  . Peripheral vascular disease ([place]) 09/14/2017  . Aneurysm of ascending aorta (HCC) 02/14/2015  . Essential hypertension 01/21/2014  . HYPOTHYROIDISM 02/01/2007  . GERD 02/01/2007  . ARTHRITIS, RHEUMATOID 02/01/2007  . OSTEOPENIA 02/01/2007  . COLONIC POLYPS, HX OF 02/01/2007  . DISEASE, CELIAC 04/05/2002  . POLYMYALGIA RHEUMATICA 04/05/2001   PCP:  Adin Hector, MD Pharmacy:  [facility] - [place], [place] - [address] [place] [address] Phone: [phone] Fax: [phone]     Social Determinants of Health (SDOH) Interventions    Readmission Risk Interventions No flowsheet data found.

## 2019-11-30 NOTE — Progress Notes (Signed)
Initial Nutrition Assessment  DOCUMENTATION CODES:   Not applicable  INTERVENTION:   Ensure Enlive po BID, each supplement provides 350 kcal and 20 grams of protein  MVI daily   Recommend [place] with D po BID  Gluten free diet with advancement   NUTRITION DIAGNOSIS:   Increased nutrient needs related to hip fracture as evidenced by increased estimated needs.  GOAL:   Patient will meet greater than or equal to 90% of their needs  MONITOR:   PO intake, Supplement acceptance, Labs, Weight trends, Skin, I & O's  REASON FOR ASSESSMENT:   Consult Hip fracture protocol  ASSESSMENT:   86 y.o. female with a known history of rheumatoid arthritis, celiac disease, hypertension, hyperlipidemia, GERD, DJD and thoracic aortic aneurysm recently stented who presents with hip fracutre after fall   Unable to see pt today as pt in surgery at time of RD visit. Pt with increased estimated needs r/t post-op healing. Pt NPO for fracture repair today. RD will add supplements and MVI for once diet advanced. Per chart, pt noted to have celiac disease; pt will need gluten free diet once advanced. Per chart, pt appears fairly weight stable pta. RD will obtain nutrition related history and exam at follow-up.   Medications reviewed and include: folic acid, synthroid, methotrexate, MVI, prednisone, senokot, NaCl @75ml /hr  Labs reviewed:   NUTRITION - FOCUSED PHYSICAL EXAM: Unable to perform at this time   Diet Order:   Diet Order            Diet NPO time specified  Diet effective now                EDUCATION NEEDS:   Education needs have been addressed  Skin:  Skin Assessment: Reviewed RN Assessment (laceration R ear)  Last BM:  pta  Height:   Ht Readings from Last 1 Encounters:  11/30/19 5\' 6"  (1.676 m)    Weight:   Wt Readings from Last 1 Encounters:  11/30/19 72.6 kg    Ideal Body Weight:  59 kg  BMI:  Body mass index is 25.82 kg/m.  Estimated Nutritional Needs:    Kcal:  1500-1700kcal/day  Protein:  75-85g/day  Fluid:  >1.5L/day  Koleen Distance MS, RD, LDN Please refer to [facility] for RD and/or RD on-call/weekend/after hours pager

## 2019-12-01 DIAGNOSIS — M069 Rheumatoid arthritis, unspecified: Secondary | ICD-10-CM

## 2019-12-01 DIAGNOSIS — I712 Thoracic aortic aneurysm, without rupture: Secondary | ICD-10-CM

## 2019-12-01 DIAGNOSIS — M353 Polymyalgia rheumatica: Secondary | ICD-10-CM

## 2019-12-01 DIAGNOSIS — M949 Disorder of cartilage, unspecified: Secondary | ICD-10-CM

## 2019-12-01 DIAGNOSIS — M899 Disorder of bone, unspecified: Secondary | ICD-10-CM

## 2019-12-01 DIAGNOSIS — K219 Gastro-esophageal reflux disease without esophagitis: Secondary | ICD-10-CM

## 2019-12-01 DIAGNOSIS — E039 Hypothyroidism, unspecified: Secondary | ICD-10-CM

## 2019-12-01 LAB — CBC
HCT: 32.1 % — ABNORMAL LOW (ref 36.0–46.0)
Hemoglobin: 10.9 g/dL — ABNORMAL LOW (ref 12.0–15.0)
MCH: 31.1 pg (ref 26.0–34.0)
MCHC: 34 g/dL (ref 30.0–36.0)
MCV: 91.5 fL (ref 80.0–100.0)
Platelets: 172 10*3/uL (ref 150–400)
RBC: 3.51 MIL/uL — ABNORMAL LOW (ref 3.87–5.11)
RDW: 16.2 % — ABNORMAL HIGH (ref 11.5–15.5)
WBC: 16.7 10*3/uL — ABNORMAL HIGH (ref 4.0–10.5)
nRBC: 0 % (ref 0.0–0.2)

## 2019-12-01 LAB — BASIC METABOLIC PANEL
Anion gap: 6 (ref 5–15)
BUN: 11 mg/dL (ref 8–23)
CO2: 25 mmol/L (ref 22–32)
Calcium: 8.3 mg/dL — ABNORMAL LOW (ref 8.9–10.3)
Chloride: 108 mmol/L (ref 98–111)
Creatinine, Ser: 0.59 mg/dL (ref 0.44–1.00)
GFR calc Af Amer: >60 mL/min (ref >60)
GFR calc non Af Amer: >60 mL/min (ref >60)
Glucose, Bld: 153 mg/dL — ABNORMAL HIGH (ref 70–99)
Potassium: 3.5 mmol/L (ref 3.5–5.1)
Sodium: 139 mmol/L (ref 135–145)

## 2019-12-01 NOTE — TOC Progression Note (Signed)
Transition of Care [facility]) - Progression Note    Patient Details  Name: Anelia Carriveau MRN: [id number] Date of Birth: 02-Aug-1925  Transition of Care [facility]) CM/SW Contact  Yehia Mcbain, Nonda Lou, [place] Phone Number: 12/01/2019, 4:54 PM  Clinical Narrative:    Patient is progressing well. She is working with PT. Discharge plan is for SNF placement.   Expected Discharge Plan: [place] Barriers to Discharge: Continued Medical Work up  Expected Discharge Plan and Services Expected Discharge Plan: [place] In-house Referral: Clinical Social Work     Living arrangements for the past 2 months: [place]                                       Social Determinants of Health (SDOH) Interventions    Readmission Risk Interventions No flowsheet data found.

## 2019-12-01 NOTE — Progress Notes (Signed)
Physical Therapy Evaluation Patient Details Name: Gloria [name] MRN: [id number] DOB: 23-Nov-1925 Today's Date: 12/01/2019   History of Present Illness  Per MD note:Gloria [name]  is a 86 y.o. female with a known history of rheumatoid arthritis, hypertension, hyperlipidemia, Gerd, DJD comes to the emergency room after mechanical fall at home. She is now s/p L THR.  Clinical Impression  Patient agrees to PT evaluation. She reports minimal pain. She has poor strength 2/5 in RLE. She needs mod assist for sit to supine bed mobility and min assist for supine to sit bed mobility. She has good sitting balance and fair standing balance with RW. She ambulates 20 feet with RW and min assist. She will benefit from skilled PT to improve mobility and strength.     Follow Up Recommendations SNF    Equipment Recommendations       Recommendations for Other Services       Precautions / Restrictions Precautions Precautions: Fall Restrictions Weight Bearing Restrictions: Yes RLE Weight Bearing: Weight bearing as tolerated      Mobility  Bed Mobility Overal bed mobility: Needs Assistance Bed Mobility: Supine to Sit;Sit to Supine     Supine to sit: Min assist Sit to supine: Max assist   General bed mobility comments: needs max VC  Transfers Overall transfer level: Needs assistance Equipment used: Rolling walker (2 wheeled) Transfers: Sit to/from Stand Sit to Stand: Min assist            Ambulation/Gait Ambulation/Gait assistance: Herbalist (Feet): 20 Feet Assistive device: Rolling walker (2 wheeled) Gait Pattern/deviations: Step-to pattern        Stairs            Wheelchair Mobility    Modified Rankin (Stroke Patients Only)       Balance Overall balance assessment: Modified Independent                                           Pertinent Vitals/Pain Pain Assessment: Faces Faces Pain Scale: Hurts little more Pain  Location:  (right hip) Pain Descriptors / Indicators: Aching Pain Intervention(s): Limited activity within patient's tolerance;Monitored during session    Home Living Family/patient expects to be discharged to:: Skilled nursing facility                      Prior Function Level of Independence: Independent               Hand Dominance        Extremity/Trunk Assessment   Upper Extremity Assessment Upper Extremity Assessment: Overall WFL for tasks assessed    Lower Extremity Assessment Lower Extremity Assessment: RLE deficits/detail RLE Deficits / Details: 2/5 hip flex, abd       Communication   Communication: No difficulties  Cognition Arousal/Alertness: Awake/alert Behavior During Therapy: WFL for tasks assessed/performed Overall Cognitive Status: Within Functional Limits for tasks assessed                                        General Comments      Exercises     Assessment/Plan    PT Assessment Patient needs continued PT services  PT Problem List Decreased strength;Decreased activity tolerance;Decreased balance;Pain       PT Treatment Interventions Gait  training;Functional mobility training;Therapeutic activities;Therapeutic exercise    PT Goals (Current goals can be found in the Care Plan section)  Acute Rehab PT Goals Patient Stated Goal: to walk  PT Goal Formulation: With patient Time For Goal Achievement: 12/15/19 Potential to Achieve Goals: Good    Frequency BID   Barriers to discharge Decreased caregiver support      Co-evaluation               AM-PAC PT "6 Clicks" Mobility  Outcome Measure Help needed turning from your back to your side while in a flat bed without using bedrails?: A Little Help needed moving from lying on your back to sitting on the side of a flat bed without using bedrails?: A Little Help needed moving to and from a bed to a chair (including a wheelchair)?: A Lot Help needed standing  up from a chair using your arms (e.g., wheelchair or bedside chair)?: A Lot Help needed to walk in hospital room?: A Little Help needed climbing 3-5 steps with a railing? : Total 6 Click Score: 14    End of Session Equipment Utilized During Treatment: Gait belt Activity Tolerance: Patient tolerated treatment well;Patient limited by pain Patient left: in bed;with bed alarm set Nurse Communication: Mobility status PT Visit Diagnosis: Muscle weakness (generalized) (M62.81);History of falling (Z91.81);Difficulty in walking, not elsewhere classified (R26.2)    Time: 1015-1040 PT Time Calculation (min) (ACUTE ONLY): 25 min   Charges:   PT Evaluation $PT Eval Low Complexity: 1 Low PT Treatments $Gait Training: 8-22 mins          Alanson Puls, PT DPT 12/01/2019, 12:25 PM

## 2019-12-01 NOTE — Progress Notes (Signed)
PROGRESS NOTE    Gloria [name]  SEG:[id number] DOB: October 31, 1925 DOA: 11/30/2019 PCP: Adin Hector, MD    Brief Narrative:  Patient admitted to the hospital working diagnosis of right hip fracture.  86 year old female with a past medical history for rheumatoid arthritis, hypertension, dyslipidemia, GERD degenerative joint disease.  Patient sustained a mechanical fall from her own height.  Patient was trying to reach the bathroom at 2:30 AM when she fell, no head trauma or loss of consciousness.  She hit her right hip, experiencing severe pain that prompted her to come to the hospital. On her initial physical examination blood pressure 153/62, heart rate 68, temperature 97.4, the rate 16, oxygen saturation 95%.  Her lungs were clear to auscultation bilaterally, heart S1-S2, present rhythmic, soft abdomen, no lower extremity edema, the right leg was shortened and rotated. EKG 63 bpm, normal axis, first-degree AV block, sinus rhythm, no ST segment or T wave changes. Right hip film with displaced right femoral neck fracture.  Patient underwent right hip unipolar hemiarthroplasty, 8/27.  Assessment & Plan:   Principal Problem:   Closed right hip fracture (HCC) Active Problems:   Hypothyroidism   GERD   Rheumatoid arthritis ([place])   Polymyalgia rheumatica (HCC)   Disorder of bone and cartilage   Aneurysm of ascending aorta (HCC)   Essential hypertension   Peripheral vascular disease ([place])   Hip fracture ([place])   1. Acute right hip fracture sp unipolar hemiarthroplasty. Patient continue to have pain on her right hip. Improved with analgesics and muscle relaxants.  Continue to follow up with physical/ occupational therapy and orthopedics.   2. HTN/ dyslipidemia. Continue blood pressure control and monitoring. Continue with amlodipine and carvedilol.   On atorvastatin.   3, RA. No signs of active flare. On methotrexate and prednisone.   4. Ascending thoracic aortic aneurysm.  Continue blood pressure control.   5. New metabolic encephalopathy. Patient has been confused and disorientated, not agitated Will continue close monitoring, her daughter is at the bedside and has been reorienting her.   6. Hypothyroidism. Continue with levothyroxine.   Status is: Inpatient  Remains inpatient appropriate because:Inpatient level of care appropriate due to severity of illness   Dispo: The patient is from: Home              Anticipated d/c is to: SNF              Anticipated d/c date is: 2 days              Patient currently is not medically stable to d/c.   DVT prophylaxis: Enoxaparin   Code Status:   full  Family Communication:  I spoke with patient's daughter at the bedside, we talked in detail about patient's condition, plan of care and prognosis and all questions were addressed.      Nutrition Status: Nutrition Problem: Increased nutrient needs Etiology: hip fracture Signs/Symptoms: estimated needs Interventions: Ensure Enlive (each supplement provides 350kcal and 20 grams of protein), MVI     Skin Documentation:     Consultants:   Orthopedics   Procedures:   right hip unipolar hemiarthroplasty, 8/27.     Subjective: Patient continue to have pain at the right hip, per her daughter at the bedside she has been confused and disorientated.   Objective: Vitals:   11/30/19 2330 12/01/19 0436 12/01/19 0820 12/01/19 1256  BP: (!) 143/62 (!) 126/56 134/64 132/62  Pulse: 60 62 60 71  Resp: 15 16 12  16  Temp: 97.8 F (36.6 C) 98.1 F (36.7 C) 97.9 F (36.6 C) (!) 97.5 F (36.4 C)  TempSrc: Oral Oral Oral Oral  SpO2: 100% 95% 95% 96%  Weight:      Height:        Intake/Output Summary (Last 24 hours) at 12/01/2019 1547 Last data filed at 12/01/2019 1301 Gross per 24 hour  Intake 1015.57 ml  Output 1250 ml  Net -234.43 ml   Filed Weights   11/30/19 0319  Weight: 72.6 kg    Examination:   General: Not in pain or dyspnea, deconditioned    Neurology: Awake and alert, non focal, mild confusion but no agitation.  E ENT: no pallor, no icterus, oral mucosa moist Cardiovascular: No JVD. S1-S2 present, rhythmic, no gallops, rubs, or murmurs. No lower extremity edema. Pulmonary: positive breath sounds bilaterally, adequate air movement, no wheezing, rhonchi or rales. Gastrointestinal. Abdomen soft and non tender Skin. No rashes Musculoskeletal: no joint deformities/ clean surgical wound.      Data Reviewed: I have personally reviewed following labs and imaging studies  CBC: Recent Labs  Lab 11/30/19 0336 12/01/19 0609  WBC 8.2 16.7*  NEUTROABS 4.6  --   HGB 12.0 10.9*  HCT 35.4* 32.1*  MCV 92.4 91.5  PLT 180 710   Basic Metabolic Panel: Recent Labs  Lab 11/30/19 0336 12/01/19 0609  NA 142 139  K 3.6 3.5  CL 107 108  CO2 26 25  GLUCOSE 93 153*  BUN 17 11  CREATININE 0.63 0.59  CALCIUM 8.8* 8.3*   GFR: Estimated Creatinine Clearance: 45.8 mL/min (by C-G formula based on SCr of 0.59 mg/dL). Liver Function Tests: No results for input(s): AST, ALT, ALKPHOS, BILITOT, PROT, ALBUMIN in the last 168 hours. No results for input(s): LIPASE, AMYLASE in the last 168 hours. No results for input(s): AMMONIA in the last 168 hours. Coagulation Profile: Recent Labs  Lab 11/30/19 0439  INR 1.0   Cardiac Enzymes: No results for input(s): CKTOTAL, CKMB, CKMBINDEX, TROPONINI in the last 168 hours. BNP (last 3 results) No results for input(s): PROBNP in the last 8760 hours. HbA1C: No results for input(s): HGBA1C in the last 72 hours. CBG: No results for input(s): GLUCAP in the last 168 hours. Lipid Profile: No results for input(s): CHOL, HDL, LDLCALC, TRIG, CHOLHDL, LDLDIRECT in the last 72 hours. Thyroid Function Tests: No results for input(s): TSH, T4TOTAL, FREET4, T3FREE, THYROIDAB in the last 72 hours. Anemia Panel: No results for input(s): VITAMINB12, FOLATE, FERRITIN, TIBC, IRON, RETICCTPCT in the last 72  hours.    Radiology Studies: I have reviewed all of the imaging during this hospital visit personally     Scheduled Meds: . acetaminophen  500 mg Oral Q6H  . amLODipine  5 mg Oral Daily  . atorvastatin  20 mg Oral q1800  . carvedilol  6.25 mg Oral BID WC  . docusate sodium  100 mg Oral BID  . enoxaparin (LOVENOX) injection  40 mg Subcutaneous Q24H  . feeding supplement (ENSURE ENLIVE)  237 mL Oral BID BM  . folic acid  1 mg Oral Daily  . levothyroxine  75 mcg Oral QAC breakfast  . methotrexate  15 mg Oral Q Fri-1800  . multivitamin with minerals  1 tablet Oral Daily  . predniSONE  2.5 mg Oral Daily  . senna  1 tablet Oral BID   Continuous Infusions:   LOS: 1 day        Yuktha Kerchner Gerome Apley, MD

## 2019-12-01 NOTE — Progress Notes (Signed)
Physical Therapy Treatment Patient Details Name: Gloria [name] MRN: [id number] DOB: [birth year] Today'[name] Date: 12/01/2019    History of Present Illness Per MD note:Gloria [name]  is a 86 y.o. female with a known history of rheumatoid arthritis, hypertension, hyperlipidemia, Gerd, DJD comes to the emergency room after mechanical fall at home. She is now [name]/p L THR.    PT Comments    Patient agrees to PT treatment. She reports minimal pain. She is educated about her hip precautions. She has poor strength 2/5 in RLE. She needs mod assist for sit to supine bed mobility and min assist for supine to sit bed mobility. She has good sitting balance and fair standing balance with RW. She ambulates 20 feet with RW and min assist. She will benefit from skilled PT to improve mobility and strength.    Follow Up Recommendations  SNF     Equipment Recommendations       Recommendations for Other Services       Precautions / Restrictions Precautions Precautions: Fall Restrictions Weight Bearing Restrictions: Yes RLE Weight Bearing: Weight bearing as tolerated    Mobility  Bed Mobility Overal bed mobility: Needs Assistance Bed Mobility: Supine to Sit;Sit to Supine     Supine to sit: Min assist Sit to supine: Max assist   General bed mobility comments: needs max VC  Transfers Overall transfer level: Needs assistance Equipment used: Rolling walker (2 wheeled) Transfers: Sit to/from Stand Sit to Stand: Min assist            Ambulation/Gait Ambulation/Gait assistance: Herbalist (Feet): 20 Feet Assistive device: Rolling walker (2 wheeled) Gait Pattern/deviations: Step-to pattern         Stairs             Wheelchair Mobility    Modified Rankin (Stroke Patients Only)       Balance Overall balance assessment: Modified Independent                                          Cognition Arousal/Alertness: Awake/alert Behavior  During Therapy: WFL for tasks assessed/performed Overall Cognitive Status: Within Functional Limits for tasks assessed                                        Exercises      General Comments        Pertinent Vitals/Pain Pain Assessment: Faces Faces Pain Scale: Hurts little more Pain Location:  (right hip) Pain Descriptors / Indicators: Aching Pain Intervention([name]): Limited activity within patient'[name] tolerance;Monitored during session    Home Living Family/patient expects to be discharged to:: Skilled nursing facility                    Prior Function Level of Independence: Independent          PT Goals (current goals can now be found in the care plan section) Acute Rehab PT Goals Patient Stated Goal: to walk  PT Goal Formulation: With patient Time For Goal Achievement: 12/15/19 Potential to Achieve Goals: Good    Frequency    BID      PT Plan      Co-evaluation              AM-PAC PT "6 Clicks" Mobility  Outcome Measure  Help needed turning from your back to your side while in a flat bed without using bedrails?: A Little Help needed moving from lying on your back to sitting on the side of a flat bed without using bedrails?: A Little Help needed moving to and from a bed to a chair (including a wheelchair)?: A Lot Help needed standing up from a chair using your arms (e.g., wheelchair or bedside chair)?: A Lot Help needed to walk in hospital room?: A Little Help needed climbing 3-5 steps with a railing? : Total 6 Click Score: 14    End of Session Equipment Utilized During Treatment: Gait belt Activity Tolerance: Patient tolerated treatment well;Patient limited by pain Patient left: in bed;with bed alarm set Nurse Communication: Mobility status PT Visit Diagnosis: Muscle weakness (generalized) (M62.81);History of falling (Z91.81);Difficulty in walking, not elsewhere classified (R26.2)     Time: 2103-1281 PT Time Calculation  (min) (ACUTE ONLY): 26 min  Charges:  $Gait Training: 8-22 mins $Therapeutic Activity: 8-22 mins                        [place], Gloria [name], PT DPT 12/01/2019, 2:22 PM

## 2019-12-01 NOTE — Progress Notes (Signed)
   Subjective: 1 Day Post-Op Procedure(s) (LRB): ARTHROPLASTY BIPOLAR HIP (HEMIARTHROPLASTY) (Right) Patient reports pain as mild.   Patient is well, and has had no acute complaints or problems Denies any CP, SOB, ABD pain. We will start physical therapy today.   Objective: Vital signs in last 24 hours: Temp:  [97.4 F (36.3 C)-98.6 F (37 C)] 97.9 F (36.6 C) (08/28 0820) Pulse Rate:  [51-63] 60 (08/28 0820) Resp:  [10-22] 12 (08/28 0820) BP: (97-148)/(48-65) 134/64 (08/28 0820) SpO2:  [92 %-100 %] 95 % (08/28 0820)  Intake/Output from previous day: 08/27 0701 - 08/28 0700 In: 2215.6 [P.O.:180; I.V.:1635.6; IV Piggyback:400] Out: 1900 [Urine:1800; Blood:100] Intake/Output this shift: No intake/output data recorded.  Recent Labs    11/30/19 0336 12/01/19 0609  HGB 12.0 10.9*   Recent Labs    11/30/19 0336 12/01/19 0609  WBC 8.2 16.7*  RBC 3.83* 3.51*  HCT 35.4* 32.1*  PLT 180 172   Recent Labs    11/30/19 0336 12/01/19 0609  NA 142 139  K 3.6 3.5  CL 107 108  CO2 26 25  BUN 17 11  CREATININE 0.63 0.59  GLUCOSE 93 153*  CALCIUM 8.8* 8.3*   Recent Labs    11/30/19 0439  INR 1.0    EXAM General - Patient is Alert, Appropriate and Oriented Extremity - Neurovascular intact Sensation intact distally Intact pulses distally Dorsiflexion/Plantar flexion intact No cellulitis present Compartment soft Dressing - dressing C/D/I and scant drainage Motor Function - intact, moving foot and toes well on exam.   Past Medical History:  Diagnosis Date  . Arthritis   . Collagen vascular disease (HCC)    RA  . Heartburn   . Hypertension     Assessment/Plan:   1 Day Post-Op Procedure(s) (LRB): ARTHROPLASTY BIPOLAR HIP (HEMIARTHROPLASTY) (Right) Active Problems:   Closed right hip fracture (HCC)   Hip fracture (HCC)  Estimated body mass index is 25.82 kg/m as calculated from the following:   Height as of this encounter: 5\' 6"  (1.676 m).   Weight as  of this encounter: 72.6 kg. Advance diet Up with therapy  Work on bowel movement Vital signs stable Labs are stable.  Hemoglobin 10.9.  Will recheck hemoglobin in the morning Pain well controlled with Tylenol Care management to assist with discharge.   DVT Prophylaxis - Lovenox, TED hose and SCDs Weight-Bearing as tolerated to right leg   T. Rachelle Hora, PA-C [place] 12/01/2019, 10:08 AM

## 2019-12-02 DIAGNOSIS — I739 Peripheral vascular disease, unspecified: Secondary | ICD-10-CM

## 2019-12-02 LAB — BASIC METABOLIC PANEL
Anion gap: 8 (ref 5–15)
BUN: 18 mg/dL (ref 8–23)
CO2: 27 mmol/L (ref 22–32)
Calcium: 7.9 mg/dL — ABNORMAL LOW (ref 8.9–10.3)
Chloride: 104 mmol/L (ref 98–111)
Creatinine, Ser: 0.48 mg/dL (ref 0.44–1.00)
GFR calc Af Amer: >60 mL/min (ref >60)
GFR calc non Af Amer: >60 mL/min (ref >60)
Glucose, Bld: 104 mg/dL — ABNORMAL HIGH (ref 70–99)
Potassium: 3.1 mmol/L — ABNORMAL LOW (ref 3.5–5.1)
Sodium: 139 mmol/L (ref 135–145)

## 2019-12-02 LAB — CBC
HCT: 30.5 % — ABNORMAL LOW (ref 36.0–46.0)
Hemoglobin: 10 g/dL — ABNORMAL LOW (ref 12.0–15.0)
MCH: 31.3 pg (ref 26.0–34.0)
MCHC: 32.8 g/dL (ref 30.0–36.0)
MCV: 95.3 fL (ref 80.0–100.0)
Platelets: 183 10*3/uL (ref 150–400)
RBC: 3.2 MIL/uL — ABNORMAL LOW (ref 3.87–5.11)
RDW: 16.3 % — ABNORMAL HIGH (ref 11.5–15.5)
WBC: 16 10*3/uL — ABNORMAL HIGH (ref 4.0–10.5)
nRBC: 0 % (ref 0.0–0.2)

## 2019-12-02 MED ORDER — HYDROCODONE-ACETAMINOPHEN 5-325 MG PO TABS: 1.0000 | ORAL_TABLET | Freq: Four times a day (QID) | ORAL | 0 refills | Status: DC | PRN

## 2019-12-02 MED ORDER — HYDROCODONE-ACETAMINOPHEN 5-325 MG PO TABS
1.0000 | ORAL_TABLET | Freq: Four times a day (QID) | ORAL | Status: DC | PRN
  Administered 2019-12-03: 1 via ORAL
  Filled 2019-12-02: qty 1

## 2019-12-02 MED ORDER — ACETAMINOPHEN 325 MG PO TABS
650.0000 mg | ORAL_TABLET | Freq: Four times a day (QID) | ORAL | Status: DC
  Administered 2019-12-02 – 2019-12-03 (×3): 650 mg via ORAL
  Filled 2019-12-02 (×4): qty 2

## 2019-12-02 MED ORDER — POTASSIUM CHLORIDE CRYS ER 20 MEQ PO TBCR
40.0000 meq | EXTENDED_RELEASE_TABLET | ORAL | Status: AC
  Administered 2019-12-02 (×2): 40 meq via ORAL
  Filled 2019-12-02 (×2): qty 2

## 2019-12-02 MED ORDER — TRAZODONE HCL 50 MG PO TABS
50.0000 mg | ORAL_TABLET | Freq: Every day | ORAL | Status: DC
  Administered 2019-12-02: 50 mg via ORAL
  Filled 2019-12-02: qty 1

## 2019-12-02 MED ORDER — ENOXAPARIN SODIUM 40 MG/0.4ML ~~LOC~~ SOLN: 40.0000 mg | SUBCUTANEOUS | 0 refills | Status: DC

## 2019-12-02 NOTE — NC FL2 (Signed)
[place] LEVEL OF CARE SCREENING TOOL     IDENTIFICATION  Patient Name: Gloria [name] Birthdate: 1925/11/07 Sex: female Admission Date (Current Location): 11/30/2019  [facility] and [place] Number:  Engineering geologist and Address:  [facility], [address], [place], [place] [address]      Provider Number: [id number]  Attending Physician Name and Address:  Tawni Millers,*  Relative Name and Phone Number:  Ellison Hughs- [phone]    Current Level of Care: Hospital Recommended Level of Care: [place] Prior Approval Number:    Date Approved/Denied:   PASRR Number: pending  Discharge Plan: SNF    Current Diagnoses: Patient Active Problem List   Diagnosis Date Noted   Closed right hip fracture ([place]) 11/30/2019   Hip fracture ([place]) 11/30/2019   Acute pain of right shoulder    Fall    Intramural hematoma of thoracic aorta ([place]) 04/08/2019   Thoracic ascending aortic aneurysm ([place]) 04/07/2019   Anemia 02/14/2018   Peripheral vascular disease ([place]) 09/14/2017   Aneurysm of ascending aorta ([facility]) 02/14/2015   Essential hypertension 01/21/2014   Hypothyroidism 02/01/2007   GERD 02/01/2007   Rheumatoid arthritis ([place]) 02/01/2007   Disorder of bone and cartilage 02/01/2007   COLONIC POLYPS, HX OF 02/01/2007   DISEASE, CELIAC 04/05/2002   Polymyalgia rheumatica ([place]) 04/05/2001    Orientation RESPIRATION BLADDER Height & Weight     Self, Time, Situation, Place  Normal Continent Weight: 72.6 kg Height:  5\' 6"  (167.6 cm)  BEHAVIORAL SYMPTOMS/MOOD NEUROLOGICAL BOWEL NUTRITION STATUS      Continent Diet  AMBULATORY STATUS COMMUNICATION OF NEEDS Skin   Extensive Assist   Surgical wounds (Right hip dressing)                       Personal Care Assistance Level of Assistance  Bathing, Total care, Dressing Bathing Assistance: Limited assistance   Dressing Assistance: Limited  assistance Total Care Assistance: Limited assistance   Functional Limitations Info             SPECIAL CARE FACTORS FREQUENCY  PT (By licensed PT), OT (By licensed OT)     PT Frequency: 5x weekly OT Frequency: 5x weekly            Contractures Contractures Info: Not present    Additional Factors Info                  Current Medications (12/02/2019):  This is the current hospital active medication list Current Facility-Administered Medications  Medication Dose Route Frequency Provider Last Rate Last Admin   acetaminophen (TYLENOL) tablet 650 mg  650 mg Oral Q6H Arrien, Jimmy Picket, MD       amLODipine (NORVASC) tablet 5 mg  5 mg Oral Daily Poggi, Marshall Cork, MD   5 mg at 12/02/19 0810   atorvastatin (LIPITOR) tablet 20 mg  20 mg Oral q1800 Poggi, Marshall Cork, MD   20 mg at 12/01/19 1706   bisacodyl (DULCOLAX) suppository 10 mg  10 mg Rectal Daily PRN Poggi, Marshall Cork, MD       carvedilol (COREG) tablet 6.25 mg  6.25 mg Oral BID WC Poggi, Marshall Cork, MD   6.25 mg at 12/02/19 0810   diphenhydrAMINE (BENADRYL) 12.5 MG/5ML elixir 12.5-25 mg  12.5-25 mg Oral Q4H PRN Poggi, Marshall Cork, MD       docusate sodium (COLACE) capsule 100 mg  100 mg Oral BID Poggi, Marshall Cork,  MD   100 mg at 12/02/19 0810   enoxaparin (LOVENOX) injection 40 mg  40 mg Subcutaneous Q24H Poggi, Marshall Cork, MD   40 mg at 12/02/19 0811   feeding supplement (ENSURE ENLIVE) (ENSURE ENLIVE) liquid 237 mL  237 mL Oral BID BM Fritzi Mandes, MD   237 mL at 63/89/37 3428   folic acid (FOLVITE) tablet 1 mg  1 mg Oral Daily Poggi, Marshall Cork, MD   1 mg at 12/02/19 0810   HYDROcodone-acetaminophen (NORCO/VICODIN) 5-325 MG per tablet 1 tablet  1 tablet Oral Q6H PRN Duanne Guess, PA-C       levothyroxine (SYNTHROID) tablet 75 mcg  75 mcg Oral QAC breakfast Corky Mull, MD   75 mcg at 12/02/19 0518   magnesium hydroxide (MILK OF MAGNESIA) suspension 30 mL  30 mL Oral Daily PRN Corky Mull, MD   30 mL at 12/02/19 7681    methocarbamol (ROBAXIN) tablet 500 mg  500 mg Oral Q6H PRN Corky Mull, MD   500 mg at 12/02/19 0809   methotrexate (RHEUMATREX) tablet 15 mg  15 mg Oral Q Fri-1800 Fritzi Mandes, MD   15 mg at 11/30/19 2021   metoCLOPramide (REGLAN) tablet 5-10 mg  5-10 mg Oral Q8H PRN Poggi, Marshall Cork, MD       Or   metoCLOPramide (REGLAN) injection 5-10 mg  5-10 mg Intravenous Q8H PRN Poggi, Marshall Cork, MD       multivitamin with minerals tablet 1 tablet  1 tablet Oral Daily Poggi, Marshall Cork, MD   1 tablet at 12/02/19 0810   ondansetron (ZOFRAN) tablet 4 mg  4 mg Oral Q6H PRN Poggi, Marshall Cork, MD       Or   ondansetron (ZOFRAN) injection 4 mg  4 mg Intravenous Q6H PRN Poggi, Marshall Cork, MD       polyethylene glycol (MIRALAX / GLYCOLAX) packet 17 g  17 g Oral Daily PRN Poggi, Marshall Cork, MD   17 g at 12/02/19 0518   predniSONE (DELTASONE) tablet 2.5 mg  2.5 mg Oral Daily Poggi, Marshall Cork, MD   2.5 mg at 12/02/19 1572   senna (SENOKOT) tablet 8.6 mg  1 tablet Oral BID Corky Mull, MD   8.6 mg at 12/02/19 0810   sodium phosphate (FLEET) 7-19 GM/118ML enema 1 enema  1 enema Rectal Once PRN Poggi, Marshall Cork, MD       traZODone (DESYREL) tablet 50 mg  50 mg Oral QHS Arrien, Jimmy Picket, MD         Discharge Medications: Please see discharge summary for a list of discharge medications.  Relevant Imaging Results:  Relevant Lab Results:   Additional Information    Vishnu Moeller, [place]onda [place], RN

## 2019-12-02 NOTE — Progress Notes (Signed)
   Subjective: 2 Days Post-Op Procedure(s) (LRB): ARTHROPLASTY BIPOLAR HIP (HEMIARTHROPLASTY) (Right) Patient reports pain as mild.   Patient is well, and has had no acute complaints or problems Denies any CP, SOB, ABD pain. We will continue with physical therapy today.  Patient ambulated 20 feet yesterday, plan is to go to skilled nursing facility  Objective: Vital signs in last 24 hours: Temp:  [97.3 F (36.3 C)-99.6 F (37.6 C)] 97.3 F (36.3 C) (08/29 0808) Pulse Rate:  [65-74] 68 (08/29 0808) Resp:  [16-18] 18 (08/29 0808) BP: (121-142)/(62-110) 141/78 (08/29 0808) SpO2:  [93 %-99 %] 99 % (08/29 0808)  Intake/Output from previous day: 08/28 0701 - 08/29 0700 In: -  Out: 1000 [Urine:1000] Intake/Output this shift: No intake/output data recorded.  Recent Labs    11/30/19 0336 12/01/19 0609  HGB 12.0 10.9*   Recent Labs    11/30/19 0336 12/01/19 0609  WBC 8.2 16.7*  RBC 3.83* 3.51*  HCT 35.4* 32.1*  PLT 180 172   Recent Labs    12/01/19 0609 12/02/19 0644  NA 139 139  K 3.5 3.1*  CL 108 104  CO2 25 27  BUN 11 18  CREATININE 0.59 0.48  GLUCOSE 153* 104*  CALCIUM 8.3* 7.9*   Recent Labs    11/30/19 0439  INR 1.0    EXAM General - Patient is Alert, Appropriate and Oriented Extremity - Neurovascular intact Sensation intact distally Intact pulses distally Dorsiflexion/Plantar flexion intact No cellulitis present Compartment soft Dressing - dressing C/D/I and scant drainage Motor Function - intact, moving foot and toes well on exam.   Past Medical History:  Diagnosis Date  . Arthritis   . Collagen vascular disease (HCC)    RA  . Heartburn   . Hypertension     Assessment/Plan:   2 Days Post-Op Procedure(s) (LRB): ARTHROPLASTY BIPOLAR HIP (HEMIARTHROPLASTY) (Right) Principal Problem:   Closed right hip fracture (HCC) Active Problems:   Hypothyroidism   GERD   Rheumatoid arthritis ([place])   Polymyalgia rheumatica (HCC)   Disorder of  bone and cartilage   Aneurysm of ascending aorta (HCC)   Essential hypertension   Peripheral vascular disease (HCC)   Hip fracture (HCC)  Estimated body mass index is 25.82 kg/m as calculated from the following:   Height as of this encounter: 5\' 6"  (1.676 m).   Weight as of this encounter: 72.6 kg. Advance diet Up with therapy  Work on bowel movement Vital signs stable Pain well controlled Care management to assist with discharge to skilled nursing facility.   Patient to follow-up with [facility] orthopedics in 2 weeks for staple removal and Steri-Strip application Continue with honeycomb dressing until 2-week postop appointment Keep incision site clean and dry Compression stockings bilateral lower extremity x6 weeks, okay to remove at nighttime Lovenox 40 mg subcu daily x14 days   DVT Prophylaxis - Lovenox, TED hose and SCDs Weight-Bearing as tolerated to right leg   T. Rachelle Hora, PA-C [place] 12/02/2019, 8:42 AM

## 2019-12-02 NOTE — Progress Notes (Signed)
K 3.1, Dr Cathlean Sauer notified via page, orders placed for replacement by MD.

## 2019-12-02 NOTE — TOC Progression Note (Addendum)
Transition of Care [facility]) - Progression Note    Patient Details  Name: Gloria [name] MRN: [id number] Date of Birth: 02-22-1926  Transition of Care [facility]) CM/SW Contact  [address] [place], [place] Phone Number: 12/02/2019, 11:24 AM  Clinical Narrative:    Spoke to daughterEllison Hughs- [phone]. She shares patient currently resides at [facility] and since SNF is recommended she would like patient to go there for SNF rehab. She has outreached to the facility on yesterday and left a message. TOC team will start the process for placement. Faxed [place]-2 to [facility] for bed offers. Spoke to patient and daughter at bedside. [facility] is their 1st choice.   Expected Discharge Plan: [place] Barriers to Discharge: Continued Medical Work up  Expected Discharge Plan and Services Expected Discharge Plan: [place] In-house Referral: Clinical Social Work     Living arrangements for the past 2 months: [place]                                       Social Determinants of Health (SDOH) Interventions    Readmission Risk Interventions No flowsheet data found.

## 2019-12-02 NOTE — Progress Notes (Addendum)
Physical Therapy Treatment Patient Details Name: Gloria [name] MRN: [id number] DOB: [birth year] Today's Date: 12/02/2019    History of Present Illness Per MD note:Gloria [name]  is a 86 y.o. female with a known history of rheumatoid arthritis, hypertension, hyperlipidemia, Gerd, DJD comes to the emergency room after mechanical fall at home. She is now s/p L THR.    PT Comments    Pt in recliner, stated she slept in it last night.  Participated in exercises as described below. She is able to progress gait 95' and 40' after seated rest with min a x 1.  She needs verbal cues for hand placements with transfers and general safety as she reaches for armrest of chair when she is several feet away x 2 and needs cues and physical assist to correct each time she goes to sit during session.  She often will walk too far into walker box with LE's hitting and going under walker handle with general poor awareness.  She is unsafe to walk unassisted but voices frustration several times during session that "they won't let me do anything on my own." Education provided and while she voices understanding she will repeat statement.  SNF remains appropriate upon discharge.   Soon after session, pt calling for help from room.  Pt on commode with nursing and wanting to get back to bed.  Assisted with min a and returned to supine with min a x 1.  Positioned for comfort.  Daughter in and updated on progress.   Follow Up Recommendations  SNF     Equipment Recommendations       Recommendations for Other Services       Precautions / Restrictions Precautions Precautions: Fall Restrictions Weight Bearing Restrictions: Yes RLE Weight Bearing: Weight bearing as tolerated    Mobility  Bed Mobility               General bed mobility comments: slept in recliner and up upon arrival  Transfers Overall transfer level: Needs assistance Equipment used: Rolling walker (2 wheeled) Transfers: Sit to/from  Stand Sit to Stand: Min assist            Ambulation/Gait Ambulation/Gait assistance: Min assist Gait Distance (Feet): 80 Feet Assistive device: Rolling walker (2 wheeled) Gait Pattern/deviations: Step-through pattern;Decreased step length - right;Decreased step length - left;Trunk flexed Gait velocity: decreased   General Gait Details: 48' then 78' after seated rest   Stairs             Wheelchair Mobility    Modified Rankin (Stroke Patients Only)       Balance Overall balance assessment: Needs assistance Sitting-balance support: Feet supported Sitting balance-Leahy Scale: Good     Standing balance support: Bilateral upper extremity supported Standing balance-Leahy Scale: Poor Standing balance comment: reliant on walker for support, R LE with small buckles at times and poor position of walker during gait increasing fall risk                            Cognition Arousal/Alertness: Awake/alert Behavior During Therapy: WFL for tasks assessed/performed Overall Cognitive Status: Within Functional Limits for tasks assessed                                        Exercises Other Exercises Other Exercises: ankle pumps and quad sets in sitting, SLR and marches  x 10 in standing    General Comments        Pertinent Vitals/Pain Pain Assessment: Faces Faces Pain Scale: Hurts a little bit Pain Location: R hip Pain Descriptors / Indicators: Sore;Grimacing Pain Intervention(s): Limited activity within patient's tolerance;Monitored during session;Repositioned    Home Living                      Prior Function            PT Goals (current goals can now be found in the care plan section) Progress towards PT goals: Progressing toward goals    Frequency    BID      PT Plan Current plan remains appropriate    Co-evaluation              AM-PAC PT "6 Clicks" Mobility   Outcome Measure  Help needed turning from  your back to your side while in a flat bed without using bedrails?: A Little Help needed moving from lying on your back to sitting on the side of a flat bed without using bedrails?: A Little Help needed moving to and from a bed to a chair (including a wheelchair)?: A Little Help needed standing up from a chair using your arms (e.g., wheelchair or bedside chair)?: A Little Help needed to walk in hospital room?: A Little Help needed climbing 3-5 steps with a railing? : A Lot 6 Click Score: 17    End of Session Equipment Utilized During Treatment: Gait belt Activity Tolerance: Patient tolerated treatment well Patient left: in chair;with call bell/phone within reach;with chair alarm set Nurse Communication: Mobility status       Time: 7846-9629 PT Time Calculation (min) (ACUTE ONLY): 24 min  Charges:  $Gait Training: 8-22 mins $Therapeutic Exercise: 8-22 mins                    Chesley Noon, PTA 12/02/19, 9:36 AM

## 2019-12-02 NOTE — Progress Notes (Signed)
PROGRESS NOTE    Gloria [name]  AJG:[id number] DOB: 17-Sep-1925 DOA: 11/30/2019 PCP: Adin Hector, MD    Brief Narrative:  Patient admitted to the hospital working diagnosis of right hip fracture.  86 year old female with a past medical history for rheumatoid arthritis, hypertension, dyslipidemia, GERD degenerative joint disease.  Patient sustained a mechanical fall from her own height.  Patient was trying to reach the bathroom at 2:30 AM when she fell, no head trauma or loss of consciousness.  She hit her right hip, experiencing severe pain that prompted her to come to the hospital. On her initial physical examination blood pressure 153/62, heart rate 68, temperature 97.4, the rate 16, oxygen saturation 95%.  Her lungs were clear to auscultation bilaterally, heart S1-S2, present rhythmic, soft abdomen, no lower extremity edema, the right leg was shortened and rotated. EKG 63 bpm, normal axis, first-degree AV block, sinus rhythm, no ST segment or T wave changes. Right hip film with displaced right femoral neck fracture.  Patient underwent right hip unipolar hemiarthroplasty, 8/27.  Physical therapy has recommended follow up at SNF.    Assessment & Plan:   Principal Problem:   Closed right hip fracture (HCC) Active Problems:   Hypothyroidism   GERD   Rheumatoid arthritis ([place])   Polymyalgia rheumatica (HCC)   Disorder of bone and cartilage   Aneurysm of ascending aorta (HCC)   Essential hypertension   Peripheral vascular disease ([place])   Hip fracture ([place])   1. Acute right hip fracture sp unipolar hemiarthroplasty 08/27.  Continue pain control with acetaminophen will change to scheduled q 6 H, as needed methocarbamol and hydrocodone. Will discontinue IV morphine (she has not required this medication)  Patient will need SNF for discharge.   2. HTN/ dyslipidemia. On amlodipine and carvedilol for blood pressure control.   Continue with atorvastatin.   3, RA.  Continue with methotrexate and prednisone. No clinical signs of acute flare.   4. Ascending thoracic aortic aneurysm. Blood pressure continue to be well controlled.   5. New metabolic encephalopathy. Persistent confusion and disorientation, but not agitation. Patient is easy to be redirected, her daughter is at the bedside.  Patient is tolerating po well, will add trazodone for sleep tonight and continue neuro checks per unit protocol. Will hold on sedatives or antipsychotic medications for now.   6. Hypothyroidism. On levothyroxine.   7. Hypokalemia. Renal function with serum cr at 0,48 with K at 3,1 and serum bicarbonate at 27.  Will add 80 meq Kcl in 2 divided doses and will follow on renal panel in am.  8. Reactive leukocytosis. Chronic anemia. Wbc is 16,0,. With no clinical signs of bacterial infection, no indication for antibiotic therapy. Hgb is 10,0 and hct 30.5 Follow cell count in am.   Status is: Inpatient  Remains inpatient appropriate because:Inpatient level of care appropriate due to severity of illness   Dispo: The patient is from: Home              Anticipated d/c is to: SNF              Anticipated d/c date is: 1 day              Patient currently is medically stable to d/c.   DVT prophylaxis: Enoxaparin   Code Status:   full  Family Communication:  I spoke with patient's daughter at the bedside, we talked in detail about patient's condition, plan of care and prognosis and all questions were  addressed.      Nutrition Status: Nutrition Problem: Increased nutrient needs Etiology: hip fracture Signs/Symptoms: estimated needs Interventions: Ensure Enlive (each supplement provides 350kcal and 20 grams of protein), MVI     Skin Documentation:     Consultants:   orthopedics  Procedures:  right hip unipolar hemiarthroplasty, 8/27.   Subjective: Patient has been confused and disorientated but not agitated, her daughter is at the bedside. Tolerating  po well, pain is controlled, had difficulty sleeping last night.   Objective: Vitals:   12/01/19 1637 12/01/19 2209 12/02/19 0021 12/02/19 0808  BP: (!) 121/110 134/65 (!) 142/64 (!) 141/78  Pulse: 74 69 65 68  Resp: 16 18 17 18   Temp: 99.6 F (37.6 C) 98 F (36.7 C) 97.6 F (36.4 C) (!) 97.3 F (36.3 C)  TempSrc: Oral Oral Oral Oral  SpO2: 96% 93% 96% 99%  Weight:      Height:        Intake/Output Summary (Last 24 hours) at 12/02/2019 1045 Last data filed at 12/02/2019 0511 Gross per 24 hour  Intake --  Output 1000 ml  Net -1000 ml   Filed Weights   11/30/19 0319  Weight: 72.6 kg    Examination:   General: Not in pain or dyspnea, deconditioned  Neurology: Awake and alert, non focal, mild confusion but easy to redirect.  E ENT: mild pallor, no icterus, oral mucosa moist Cardiovascular: No JVD. S1-S2 present, rhythmic, no gallops, rubs, or murmurs. No lower extremity edema. Pulmonary: positive breath sounds bilaterally, adequate air movement, no wheezing, rhonchi or rales. Gastrointestinal. Abdomen soft and non tender Skin. No rashes Musculoskeletal: no joint deformities     Data Reviewed: I have personally reviewed following labs and imaging studies  CBC: Recent Labs  Lab 11/30/19 0336 12/01/19 0609 12/02/19 0644  WBC 8.2 16.7* 16.0*  NEUTROABS 4.6  --   --   HGB 12.0 10.9* 10.0*  HCT 35.4* 32.1* 30.5*  MCV 92.4 91.5 95.3  PLT 180 172 454   Basic Metabolic Panel: Recent Labs  Lab 11/30/19 0336 12/01/19 0609 12/02/19 0644  NA 142 139 139  K 3.6 3.5 3.1*  CL 107 108 104  CO2 26 25 27   GLUCOSE 93 153* 104*  BUN 17 11 18   CREATININE 0.63 0.59 0.48  CALCIUM 8.8* 8.3* 7.9*   GFR: Estimated Creatinine Clearance: 45.8 mL/min (by C-G formula based on SCr of 0.48 mg/dL). Liver Function Tests: No results for input(s): AST, ALT, ALKPHOS, BILITOT, PROT, ALBUMIN in the last 168 hours. No results for input(s): LIPASE, AMYLASE in the last 168 hours. No  results for input(s): AMMONIA in the last 168 hours. Coagulation Profile: Recent Labs  Lab 11/30/19 0439  INR 1.0   Cardiac Enzymes: No results for input(s): CKTOTAL, CKMB, CKMBINDEX, TROPONINI in the last 168 hours. BNP (last 3 results) No results for input(s): PROBNP in the last 8760 hours. HbA1C: No results for input(s): HGBA1C in the last 72 hours. CBG: No results for input(s): GLUCAP in the last 168 hours. Lipid Profile: No results for input(s): CHOL, HDL, LDLCALC, TRIG, CHOLHDL, LDLDIRECT in the last 72 hours. Thyroid Function Tests: No results for input(s): TSH, T4TOTAL, FREET4, T3FREE, THYROIDAB in the last 72 hours. Anemia Panel: No results for input(s): VITAMINB12, FOLATE, FERRITIN, TIBC, IRON, RETICCTPCT in the last 72 hours.    Radiology Studies: I have reviewed all of the imaging during this hospital visit personally     Scheduled Meds: . amLODipine  5 mg Oral  Daily  . atorvastatin  20 mg Oral q1800  . carvedilol  6.25 mg Oral BID WC  . docusate sodium  100 mg Oral BID  . enoxaparin (LOVENOX) injection  40 mg Subcutaneous Q24H  . feeding supplement (ENSURE ENLIVE)  237 mL Oral BID BM  . folic acid  1 mg Oral Daily  . levothyroxine  75 mcg Oral QAC breakfast  . methotrexate  15 mg Oral Q Fri-1800  . multivitamin with minerals  1 tablet Oral Daily  . potassium chloride  40 mEq Oral Q4H  . predniSONE  2.5 mg Oral Daily  . senna  1 tablet Oral BID   Continuous Infusions:   LOS: 2 days        Ileanna Gemmill Gerome Apley, MD

## 2019-12-03 ENCOUNTER — Encounter: Payer: Self-pay | Admitting: Surgery

## 2019-12-03 DIAGNOSIS — Z96649 Presence of unspecified artificial hip joint: Secondary | ICD-10-CM

## 2019-12-03 DIAGNOSIS — S72031A Displaced midcervical fracture of right femur, initial encounter for closed fracture: Secondary | ICD-10-CM | POA: Insufficient documentation

## 2019-12-03 HISTORY — DX: Presence of unspecified artificial hip joint: Z96.649

## 2019-12-03 HISTORY — DX: Displaced midcervical fracture of right femur, initial encounter for closed fracture: S72.031A

## 2019-12-03 LAB — CBC WITH DIFFERENTIAL/PLATELET
Abs Immature Granulocytes: 0.04 10*3/uL (ref 0.00–0.07)
Basophils Absolute: 0.1 10*3/uL (ref 0.0–0.1)
Basophils Relative: 1 %
Eosinophils Absolute: 0.4 10*3/uL (ref 0.0–0.5)
Eosinophils Relative: 4 %
HCT: 28.9 % — ABNORMAL LOW (ref 36.0–46.0)
Hemoglobin: 9.4 g/dL — ABNORMAL LOW (ref 12.0–15.0)
Immature Granulocytes: 0 %
Lymphocytes Relative: 24 %
Lymphs Abs: 2.4 10*3/uL (ref 0.7–4.0)
MCH: 31.1 pg (ref 26.0–34.0)
MCHC: 32.5 g/dL (ref 30.0–36.0)
MCV: 95.7 fL (ref 80.0–100.0)
Monocytes Absolute: 0.3 10*3/uL (ref 0.1–1.0)
Monocytes Relative: 3 %
Neutro Abs: 7.1 10*3/uL (ref 1.7–7.7)
Neutrophils Relative %: 68 %
Platelets: 163 10*3/uL (ref 150–400)
RBC: 3.02 MIL/uL — ABNORMAL LOW (ref 3.87–5.11)
RDW: 16.5 % — ABNORMAL HIGH (ref 11.5–15.5)
WBC: 10.3 10*3/uL (ref 4.0–10.5)
nRBC: 0 % (ref 0.0–0.2)

## 2019-12-03 LAB — SARS CORONAVIRUS 2 BY RT PCR (HOSPITAL ORDER, PERFORMED IN ~~LOC~~ HOSPITAL LAB): SARS Coronavirus 2: NEGATIVE

## 2019-12-03 LAB — BASIC METABOLIC PANEL
Anion gap: 6 (ref 5–15)
BUN: 16 mg/dL (ref 8–23)
CO2: 25 mmol/L (ref 22–32)
Calcium: 7.7 mg/dL — ABNORMAL LOW (ref 8.9–10.3)
Chloride: 108 mmol/L (ref 98–111)
Creatinine, Ser: 0.45 mg/dL (ref 0.44–1.00)
GFR calc Af Amer: >60 mL/min (ref >60)
GFR calc non Af Amer: >60 mL/min (ref >60)
Glucose, Bld: 79 mg/dL (ref 70–99)
Potassium: 4 mmol/L (ref 3.5–5.1)
Sodium: 139 mmol/L (ref 135–145)

## 2019-12-03 MED ORDER — ACETAMINOPHEN 325 MG PO TABS
650.0000 mg | ORAL_TABLET | ORAL | Status: DC | PRN
Start: 2019-12-03 — End: 2022-12-23

## 2019-12-03 MED ORDER — METHOCARBAMOL 500 MG PO TABS: 500.0000 mg | ORAL_TABLET | Freq: Four times a day (QID) | ORAL | 0 refills | Status: DC | PRN

## 2019-12-03 MED ORDER — POLYETHYLENE GLYCOL 3350 17 G PO PACK: 17.0000 g | PACK | Freq: Every day | ORAL | 0 refills | Status: DC | PRN

## 2019-12-03 MED ORDER — ENSURE ENLIVE PO LIQD: 237.0000 mL | Freq: Two times a day (BID) | ORAL | 0 refills | Status: AC

## 2019-12-03 NOTE — Progress Notes (Addendum)
Pt was transported to Facility [facility]) at this time by EMS. PIV access was removed by another nurse. Pt was transported with belongings at this time.

## 2019-12-03 NOTE — Care Management Important Message (Signed)
Important Message  Patient Details  Name: Gloria [name] MRN: [id number] Date of Birth: Nov 24, 1925   Medicare Important Message Given:  Yes     Dannette Barbara 12/03/2019, 1:17 PM

## 2019-12-03 NOTE — Plan of Care (Signed)

## 2019-12-03 NOTE — Evaluation (Signed)
Occupational Therapy Evaluation Patient Details Name: Gloria [name] MRN: [id number] DOB: Feb 21, 1926 Today's Date: 12/03/2019    History of Present Illness Per MD note:Gloria [name]  is a 86 y.o. female with a known history of rheumatoid arthritis, hypertension, hyperlipidemia, Gerd, DJD comes to the emergency room after mechanical fall at home. She is now s/p R THR.   Clinical Impression   Patient presenting with decreased I in self care, balance, functional mobility/transfers, endurance, safety awareness, and strength.  Patient reports being mod I PTA and living at [facility]. Patient currently functioning at min A overall for functional transfers with set up A for UB self care and min - mod A for LB self care. Pt is impulsive with mobility and needing increased cuing for safety. Patient will benefit from acute OT to increase overall independence in the areas of ADLs, functional mobility, and safety awareness in order to safely discharge to next venue of care.    Follow Up Recommendations  SNF    Equipment Recommendations  Other (comment) (defer to next venue of care)    Recommendations for Other Services Other (comment) (none at this time)     Precautions / Restrictions Precautions Precautions: Fall Restrictions Weight Bearing Restrictions: Yes RLE Weight Bearing: Weight bearing as tolerated      Mobility Bed Mobility               General bed mobility comments: pt seated in recliner upon entering the room  Transfers Overall transfer level: Needs assistance Equipment used: Rolling walker (2 wheeled) Transfers: Sit to/from [facility] Sit to Stand: Min guard Stand pivot transfers: Min guard            Balance Overall balance assessment: Needs assistance Sitting-balance support: Feet supported Sitting balance-Leahy Scale: Good     Standing balance support: Bilateral upper extremity supported Standing balance-Leahy Scale:  Poor Standing balance comment: reliant on walker for support, R LE with small buckles at times and poor position of walker during gait increasing fall risk                           ADL either performed or assessed with clinical judgement   ADL Overall ADL's : Needs assistance/impaired     Grooming: Wash/dry hands;Wash/dry face;Oral care;Brushing hair;Min guard;Standing   Upper Body Bathing: Set up;Sitting   Lower Body Bathing: Sit to/from stand;Moderate assistance   Upper Body Dressing : Set up;Sitting   Lower Body Dressing: Moderate assistance;Sit to/from stand                 General ADL Comments: Supervision- min guard with balance secondary to impulsivity with transfers and functional mobility. Pt utilized RW and standing at sinkf for 7 minute for grooming tasks with supervision -min guard     Vision Baseline Vision/History: No visual deficits Patient Visual Report: No change from baseline Vision Assessment?: No apparent visual deficits     Perception     Praxis      Pertinent Vitals/Pain Pain Assessment: Faces Faces Pain Scale: Hurts even more Pain Location: R hip Pain Descriptors / Indicators: Sore;Grimacing Pain Intervention(s): Limited activity within patient's tolerance;Monitored during session;Repositioned     Hand Dominance Right   Extremity/Trunk Assessment Upper Extremity Assessment Upper Extremity Assessment: Generalized weakness   Lower Extremity Assessment Lower Extremity Assessment: Defer to PT evaluation       Communication Communication Communication: No difficulties   Cognition Arousal/Alertness: Awake/alert Behavior  During Therapy: WFL for tasks assessed/performed Overall Cognitive Status: Within Functional Limits for tasks assessed                     Home Living Family/patient expects to be discharged to:: Skilled nursing facility           Prior Functioning/Environment Level of Independence: Independent                  OT Problem List: Decreased strength;Impaired balance (sitting and/or standing);Pain;Decreased safety awareness;Decreased activity tolerance;Decreased coordination      OT Treatment/Interventions: Self-care/ADL training;Therapeutic exercise;Manual therapy;Patient/family education;Neuromuscular education;Balance training;Energy conservation;Therapeutic activities;DME and/or AE instruction    OT Goals(Current goals can be found in the care plan section) Acute Rehab OT Goals Patient Stated Goal: to go home OT Goal Formulation: With patient Time For Goal Achievement: 12/17/19 Potential to Achieve Goals: Good ADL Goals Pt Will Perform Grooming: with modified independence Pt Will Transfer to Toilet: with modified independence;ambulating Pt Will Perform Toileting - Clothing Manipulation and hygiene: with modified independence;sit to/from stand  OT Frequency: Min 1X/week   Barriers to D/C: Other (comment)  none known at this time          AM-PAC OT "6 Clicks" Daily Activity     Outcome Measure Help from another person eating meals?: None Help from another person taking care of personal grooming?: A Little Help from another person toileting, which includes using toliet, bedpan, or urinal?: A Lot Help from another person bathing (including washing, rinsing, drying)?: A Little Help from another person to put on and taking off regular upper body clothing?: A Little Help from another person to put on and taking off regular lower body clothing?: A Lot 6 Click Score: 17   End of Session Equipment Utilized During Treatment: Rolling walker Nurse Communication: Mobility status  Activity Tolerance: Patient tolerated treatment well Patient left: in chair;with call bell/phone within reach;with chair alarm set  OT Visit Diagnosis: Unsteadiness on feet (R26.81);History of falling (Z91.81);Muscle weakness (generalized) (M62.81)                Time: 3300-7622 OT Time  Calculation (min): 22 min Charges:  OT General Charges $OT Visit: 1 Visit OT Evaluation $OT Eval Low Complexity: 1 Low OT Treatments $Self Care/Home Management : 8-22 mins  Darleen Crocker, MS, OTR/L , CBIS ascom [phone]  12/03/19, 11:30 AM

## 2019-12-03 NOTE — TOC Progression Note (Signed)
Transition of Care [facility]) - Progression Note    Patient Details  Name: Gloria [name] MRN: [id number] Date of Birth: [birth year]  [facility] CM/SW Contact  Su Hilt, RN Phone Number: 12/03/2019, 10:59 AM  Clinical Narrative:   Spoke with the daughter [name], the patient is a resident at [facility] and has accepted the bed offer at [facility], I called Seth Bake and left a VM letting her know that we plan to DC today, I called Janett Billow at [facility] to confirm that Mcarthur Rossetti is being waived, I left a VM, I called [place] to start ins Josem Kaufmann, ref number [id number], faxed clinical to [phone]    Expected Discharge Plan: [place] Barriers to Discharge: Continued Medical Work up  Expected Discharge Plan and Services Expected Discharge Plan: [place] In-house Referral: Clinical Social Work     Living arrangements for the past 2 months: [place] Expected Discharge Date: 12/03/19                                     Social Determinants of Health (SDOH) Interventions    Readmission Risk Interventions No flowsheet data found.

## 2019-12-03 NOTE — Progress Notes (Signed)
Physical Therapy Treatment Patient Details Name: Gloria [name] MRN: [id number] DOB: Jun 25, 1925 Today's Date: 12/03/2019    History of Present Illness Per MD note:Miakoda Von [name]  is a 86 y.o. female with a known history of rheumatoid arthritis, hypertension, hyperlipidemia, Gerd, DJD comes to the emergency room after mechanical fall at home. She is now s/p R THR.    PT Comments    Pt in recliner.  Reports feeling sleepy but ready to work. Participated in exercises as described below. Stands with min a x 1 and is able to progress to nursing station and back with RW and min a x 1.  Gait quality overall decreased today with decreased safety.  She did report that she had a sleeping pill last night and is still feeling the effects.  She elects to return to supine with mod/max a x 1 in hopes of getting a nap.   Follow Up Recommendations  SNF     Equipment Recommendations       Recommendations for Other Services       Precautions / Restrictions Precautions Precautions: Fall Restrictions Weight Bearing Restrictions: Yes RLE Weight Bearing: Weight bearing as tolerated    Mobility  Bed Mobility Overal bed mobility: Needs Assistance Bed Mobility: Sit to Supine       Sit to supine: Max assist   General bed mobility comments: pt seated in recliner upon entering the room  Transfers Overall transfer level: Needs assistance Equipment used: Rolling walker (2 wheeled) Transfers: Sit to/from Stand Sit to Stand: Min assist Stand pivot transfers: Min guard          Ambulation/Gait Ambulation/Gait assistance: Min assist Gait Distance (Feet): 100 Feet Assistive device: Rolling walker (2 wheeled) Gait Pattern/deviations: Step-through pattern;Decreased step length - right;Decreased step length - left;Trunk flexed Gait velocity: decreased   General Gait Details: to nursing desk and back with increased difficulty today.  Pt reports having a sleeping pill last night that she is  still feeling the effects from.   Stairs             Wheelchair Mobility    Modified Rankin (Stroke Patients Only)       Balance Overall balance assessment: Needs assistance Sitting-balance support: Feet supported Sitting balance-Leahy Scale: Good     Standing balance support: Bilateral upper extremity supported Standing balance-Leahy Scale: Poor Standing balance comment: reliant on walker for support, R LE with small buckles at times and poor position of walker during gait increasing fall risk                            Cognition Arousal/Alertness: Awake/alert Behavior During Therapy: WFL for tasks assessed/performed Overall Cognitive Status: Within Functional Limits for tasks assessed                                        Exercises Other Exercises Other Exercises: ankle pumps and quad sets in sitting, SLR and marches x 10 in standing    General Comments        Pertinent Vitals/Pain Pain Assessment: Faces Faces Pain Scale: Hurts little more Pain Location: R hip Pain Descriptors / Indicators: Sore;Grimacing Pain Intervention(s): Limited activity within patient's tolerance    Home Living Family/patient expects to be discharged to:: Skilled nursing facility  Prior Function Level of Independence: Independent          PT Goals (current goals can now be found in the care plan section) Acute Rehab PT Goals Patient Stated Goal: to go home Progress towards PT goals: Progressing toward goals    Frequency    BID      PT Plan Current plan remains appropriate    Co-evaluation              AM-PAC PT "6 Clicks" Mobility   Outcome Measure  Help needed turning from your back to your side while in a flat bed without using bedrails?: A Little Help needed moving from lying on your back to sitting on the side of a flat bed without using bedrails?: A Little Help needed moving to and from a bed to a  chair (including a wheelchair)?: A Little Help needed standing up from a chair using your arms (e.g., wheelchair or bedside chair)?: A Little Help needed to walk in hospital room?: A Little Help needed climbing 3-5 steps with a railing? : A Lot 6 Click Score: 17    End of Session Equipment Utilized During Treatment: Gait belt Activity Tolerance: Patient tolerated treatment well Patient left: in chair;with call bell/phone within reach;with chair alarm set Nurse Communication: Mobility status       Time: 5038-8828 PT Time Calculation (min) (ACUTE ONLY): 12 min  Charges:  $Gait Training: 8-22 mins                    Chesley Noon, PTA 12/03/19, 12:51 PM

## 2019-12-03 NOTE — Discharge Instructions (Signed)
Instructions after Total Hip Replacement     J. Jeffrey Poggi, M.D.  J. Lance Tavi Gaughran, PA-C     Dept. of Orthopaedics & Sports Medicine  [facility]  [address]  [place], [place]  [address]  Phone: [phone]   Fax: [phone]    DIET: . Drink plenty of non-alcoholic fluids. . Resume your normal diet. Include foods high in fiber.  ACTIVITY:  . You may use crutches or a walker with weight-bearing as tolerated, unless instructed otherwise. . You may be weaned off of the walker or crutches by your Physical Therapist.  . Do NOT reach below the level of your knees or cross your legs until allowed.    . Continue doing gentle exercises. Exercising will reduce the pain and swelling, increase motion, and prevent muscle weakness.   . Please continue to use the TED compression stockings for 6 weeks. You may remove the stockings at night, but should reapply them in the morning. . Do not drive or operate any equipment until instructed.  WOUND CARE:  . Continue to use ice packs periodically to reduce pain and swelling. . Keep the incision clean and dry. . You may bathe or shower after the staples are removed at the first office visit following surgery.  MEDICATIONS: . You may resume your regular medications. . Please take the pain medication as prescribed on the medication. . Do not take pain medication on an empty stomach. . You have been given a prescription for a blood thinner to prevent blood clots. Please take the medication as instructed. (NOTE: After completing a 2 week course of Lovenox, take one Enteric-coated aspirin once a day.) . Pain medications and iron supplements can cause constipation. Use a stool softener (Senokot or Colace) on a daily basis and a laxative (dulcolax or miralax) as needed. . Do not drive or drink alcoholic beverages when taking pain medications.  CALL THE OFFICE FOR: . Temperature above 101 degrees . Excessive bleeding or drainage on the  dressing. . Excessive swelling, coldness, or paleness of the toes. . Persistent nausea and vomiting.  FOLLOW-UP:  . You should have an appointment to return to the office in 2 weeks after surgery. . Arrangements have been made for continuation of Physical Therapy (either home therapy or outpatient therapy).  

## 2019-12-03 NOTE — Discharge Summary (Signed)
Physician Discharge Summary  Cherrell Maybee IOX:[id number] DOB: 04-Feb-1926 DOA: 11/30/2019  PCP: Adin Hector, MD  Admit date: 11/30/2019 Discharge date: 12/03/2019  Admitted From: Home  Disposition:  SNF   Recommendations for Outpatient Follow-up and new medication changes:  1. Follow up with Dr. Caryl Comes in 7 days.  2. Follow-up with [facility] orthopedics in 2 weeks for staple removal and Steri-Strip application. 3. Continue honeycomb dressing until 3 weeks postoperative appointment 4. Keep incision site clean and dry. 5. Compression stockings bilateral extremities for 6 weeks, okay to remove at nighttime. 6. Continue DVT prophylaxis with enoxaparin 40 mg sq daily for 14 days.   Home Health:na   Equipment/Devices: na    Discharge Condition: stable  CODE STATUS: full  Diet recommendation: heart healthy   Brief/Interim Summary: Patient admitted to the hospital working diagnosis of right hip fracture.  86 year old female with a past medical history for rheumatoid arthritis, hypertension, dyslipidemia, aortic ascending aneurysm (sp stent graft), GERD and degenerative joint disease. Patient sustained a mechanical fall from her own height.Patient was trying to reach thebathroom at 2:30 AM when she fell, no head trauma or loss of consciousness. She hit her right hip, experiencing severe pain that prompted her to come to the hospital. On her initial physical examination blood pressure 153/62, heart rate 68, temperature 97.4, respiratory rate 16, oxygen saturation 95%. Her lungs were clear to auscultation bilaterally, heart S1-S2, present rhythmic, soft abdomen, no lower extremity edema, the right leg was shortened and rotated. Sodium 142, potassium 3.6, chloride 107, bicarb 6, glucose 93, BUN 17, creatinine 0.63, white count 8.2, hemoglobin 12.0, hematocrit 35.4, platelets 180.  SARS COVID-19 was negative. Head and cervical spine CT negative for acute changes.  Chest radiograph no  infiltrates, EKG 63 bpm, normal axis, first-degree AV block, sinus rhythm, no ST segment or T wave changes. Right hip film with displaced right femoral neck fracture.  Patient underwent right hip unipolar hemiarthroplasty, 8/27.  Physical therapy has recommended follow up at SNF.  1.  Acute right hip fracture.  Patient was admitted to the medical ward, she underwent unipolar hemiarthroplasty 8/27 with good toleration. Postoperatively her pain was controlled with acetaminophen, methocarbamol and hydrocodone. Patient was evaluated by physical/occupational therapy, recommendations for continued therapy and skilled nursing facility.  Patient received DVT prophylaxis with no major complications, she need to continue enoxaparin for 14 more days.  Follow-up with [facility] orthopedics in 2 weeks for staple removal and Steri-Strip application.  Continue honeycomb dressing until 2-week postop appointment.  Keep incision clean and dry.  Compression stocking bilateral extremity for 6 weeks (okay to remove at nighttime.)  2.  Hypertension/dyslipidemia/aortic thoracic ascending aneurysm s/p stent graft (04/2019).  Her blood pressure remained well controlled with amlodipine and carvedilol. Continue atorvastatin.  3.  Rheumatoid arthritis.  No signs of acute flare, continue methotrexate and prednisone.  4.  Metabolic encephalopathy.  During her hospitalization patient showed confusion and disorientation, she was easily to be redirected.  No agitation. Trazodone was added at night as sleeping aid with good toleration.  5.  Hypothyroidism.  Continue levothyroxine.  6.  Hypokalemia.  Patient received potassium chloride for potassium correction, at her discharge sodium 139, potassium 4.0, chloride 108, bicarb 25, glucose 79, BUN 16, creatinine 0.45.  7.  Reactive leukocytosis with chronic anemia.  Her white blood cell count is trending down, 10.3 at discharge, no signs of systemic bacterial infection.  Her  hemoglobin has remained stable 10.0, to 9.4. Follow-up as an outpatient.  Discharge Diagnoses:  Principal Problem:   Closed right hip fracture ([place]) Active Problems:   Hypothyroidism   GERD   Rheumatoid arthritis ([place])   Polymyalgia rheumatica (HCC)   Disorder of bone and cartilage   Aneurysm of ascending aorta (HCC)   Essential hypertension   Peripheral vascular disease (HCC)   Hip fracture (HCC)    Discharge Instructions   Allergies as of 12/03/2019      Reactions   Codeine Nausea Only   Ibandronate Sodium    Ibandronate Sodium  [ibandronic Acid] Other (See Comments)   Gi upset   Risedronate Other (See Comments)   Gi upset   Risedronate Sodium    Sulfa Antibiotics Hives      Medication List    STOP taking these medications   traMADol 50 MG tablet Commonly known as: ULTRAM     TAKE these medications   acetaminophen 325 MG tablet Commonly known as: TYLENOL Take 2 tablets (650 mg total) by mouth every 4 (four) hours as needed for moderate pain.   amLODipine 5 MG tablet Commonly known as: NORVASC Take 5 mg by mouth daily.   aspirin 81 MG EC tablet Take 1 tablet (81 mg total) by mouth daily at 6 (six) AM.   atorvastatin 20 MG tablet Commonly known as: LIPITOR Take 1 tablet (20 mg total) by mouth daily at 6 PM.   azelastine 0.1 % nasal spray Commonly known as: ASTELIN Place into the nose.   carvedilol 6.25 MG tablet Commonly known as: COREG Take 1 tablet (6.25 mg total) by mouth 2 (two) times daily with a meal.   enoxaparin 40 MG/0.4ML injection Commonly known as: LOVENOX Inject 0.4 mLs (40 mg total) into the skin daily for 14 days.   feeding supplement (ENSURE ENLIVE) Liqd Take 237 mLs by mouth 2 (two) times daily between meals.   folic acid 1 MG tablet Commonly known as: FOLVITE TAKE 1 TABLET EVERY DAY   HYDROcodone-acetaminophen 5-325 MG tablet Commonly known as: NORCO/VICODIN Take 1 tablet by mouth every 6 (six) hours as needed for moderate  pain.   levothyroxine 75 MCG tablet Commonly known as: SYNTHROID Take 75 mcg by mouth daily.   methocarbamol 500 MG tablet Commonly known as: ROBAXIN Take 1 tablet (500 mg total) by mouth every 6 (six) hours as needed for muscle spasms.   methotrexate 2.5 MG tablet Commonly known as: RHEUMATREX TAKE 6 TABLETS (15 MG TOTAL) BY MOUTH EVERY 7 (SEVEN) DAYS FOR 168 DAYS   multivitamin with minerals Tabs tablet Take 1 tablet by mouth daily.   polyethylene glycol 17 g packet Commonly known as: MIRALAX / GLYCOLAX Take 17 g by mouth daily as needed for mild constipation.   predniSONE 5 MG tablet Commonly known as: DELTASONE Take 2.5 mg by mouth daily.       Follow-up Information    Lattie Corns, PA-C Follow up in 2 week(s).   Specialty: Physician Assistant Why: For staple removal and Steri-Strip application Contact information: [place] [address] [phone]              Allergies  Allergen Reactions  . Codeine Nausea Only  . Ibandronate Sodium   . Ibandronate Sodium  [Ibandronic Acid] Other (See Comments)    Gi upset  . Risedronate Other (See Comments)    Gi upset  . Risedronate Sodium   . Sulfa Antibiotics Hives    Consultations:  orhtopedics   Procedures/Studies: DG Chest 1 View  Result Date: 11/30/2019 CLINICAL DATA:  Fall from standing.  Right hip fracture EXAM: CHEST  1 VIEW COMPARISON:  04/11/2019 FINDINGS: Mild cardiomegaly that is chronic. Aortic stent graft. There is no edema, consolidation, effusion, or pneumothorax. IMPRESSION: No evidence of active disease. Electronically Signed   By: Monte Fantasia M.D.   On: 11/30/2019 04:15   DG Shoulder Right  Result Date: 11/30/2019 CLINICAL DATA:  Status post fall.  Pain EXAM: RIGHT SHOULDER - 2+ VIEW COMPARISON:  04/19/2012 FINDINGS: The bones appear osteopenic. No acute fracture or subluxation identified. Degenerative changes are noted at the [facility] joint  and glenohumeral joint. IMPRESSION: 1. No acute findings. 2. AC joint and glenohumeral joint osteoarthritis. Electronically Signed   By: Kerby Moors M.D.   On: 11/30/2019 06:41   CT Head Wo Contrast  Result Date: 11/30/2019 CLINICAL DATA:  Fall with right hip fracture. EXAM: CT HEAD WITHOUT CONTRAST CT CERVICAL SPINE WITHOUT CONTRAST TECHNIQUE: Multidetector CT imaging of the head and cervical spine was performed following the standard protocol without intravenous contrast. Multiplanar CT image reconstructions of the cervical spine were also generated. COMPARISON:  None. FINDINGS: CT HEAD FINDINGS Brain: No evidence of acute infarction, hemorrhage, hydrocephalus, extra-axial collection or mass lesion/mass effect. Age normal appearance of the brain Vascular: No hyperdense vessel or unexpected calcification. Skull: Negative for fracture Sinuses/Orbits: Bilateral cataract resection.  No evidence of injury CT CERVICAL SPINE FINDINGS Alignment: No traumatic malalignment. Trace degenerative anterolisthesis at T1-2 and T2-3. Skull base and vertebrae: No acute fracture Soft tissues and spinal canal: No prevertebral fluid or swelling. No visible canal hematoma. Disc levels: Disc narrowing with endplate and uncovertebral ridging. Upper chest: Negative IMPRESSION: No evidence of acute intracranial or cervical spine injury. Electronically Signed   By: Monte Fantasia M.D.   On: 11/30/2019 05:14   CT Cervical Spine Wo Contrast  Result Date: 11/30/2019 CLINICAL DATA:  Fall with right hip fracture. EXAM: CT HEAD WITHOUT CONTRAST CT CERVICAL SPINE WITHOUT CONTRAST TECHNIQUE: Multidetector CT imaging of the head and cervical spine was performed following the standard protocol without intravenous contrast. Multiplanar CT image reconstructions of the cervical spine were also generated. COMPARISON:  None. FINDINGS: CT HEAD FINDINGS Brain: No evidence of acute infarction, hemorrhage, hydrocephalus, extra-axial collection or  mass lesion/mass effect. Age normal appearance of the brain Vascular: No hyperdense vessel or unexpected calcification. Skull: Negative for fracture Sinuses/Orbits: Bilateral cataract resection.  No evidence of injury CT CERVICAL SPINE FINDINGS Alignment: No traumatic malalignment. Trace degenerative anterolisthesis at T1-2 and T2-3. Skull base and vertebrae: No acute fracture Soft tissues and spinal canal: No prevertebral fluid or swelling. No visible canal hematoma. Disc levels: Disc narrowing with endplate and uncovertebral ridging. Upper chest: Negative IMPRESSION: No evidence of acute intracranial or cervical spine injury. Electronically Signed   By: Monte Fantasia M.D.   On: 11/30/2019 05:14   DG HIP UNILAT W OR W/O PELVIS 2-3 VIEWS RIGHT  Result Date: 11/30/2019 CLINICAL DATA:  Post RIGHT THR EXAM: DG HIP (WITH OR WITHOUT PELVIS) 2-3V RIGHT COMPARISON:  Portable exam 1504 hours compared 11/30/2019 FINDINGS: Interval resection of RIGHT femoral neck fracture and femoral head and placement of a RIGHT hip prosthesis. Bones demineralized. No fracture, dislocation, or bone destruction. IMPRESSION: RIGHT hip prosthesis without acute complication. Electronically Signed   By: Lavonia Dana M.D.   On: 11/30/2019 15:15   DG Hip Unilat  With Pelvis 2-3 Views Right  Result Date: 11/30/2019 CLINICAL DATA:  Fall with right hip pain EXAM: DG  HIP (WITH OR WITHOUT PELVIS) 2-3V RIGHT COMPARISON:  None. FINDINGS: Right femoral transcervical neck fracture with lateral displacement. No notable acetabular degenerative changes. Intact and located left hip. Negative pelvic ring IMPRESSION: Displaced right femoral neck fracture. Electronically Signed   By: Monte Fantasia M.D.   On: 11/30/2019 04:18      Procedures: Right hip unipolar hemiarthroplasty  Subjective: Patient is feeling better, no nausea or vomiting, her pain is controlled and she has been ambulating with physical/ occupational therapy. No confusion or  agitation.   Discharge Exam: Vitals:   12/03/19 0020 12/03/19 0727  BP: (!) 124/57 (!) 112/56  Pulse: (!) 59 (!) 59  Resp: 20 16  Temp: 98.4 F (36.9 C) 97.8 F (36.6 C)  SpO2: 94% 97%   Vitals:   12/02/19 0808 12/02/19 1620 12/03/19 0020 12/03/19 0727  BP: (!) 141/78 129/61 (!) 124/57 (!) 112/56  Pulse: 68 63 (!) 59 (!) 59  Resp: 18 16 20 16   Temp: (!) 97.3 F (36.3 C) 98.5 F (36.9 C) 98.4 F (36.9 C) 97.8 F (36.6 C)  TempSrc: Oral  Oral   SpO2: 99% 96% 94% 97%  Weight:      Height:        General: Not in pain or dyspnea.  Neurology: Awake and alert, non focal  E ENT: mild pallor, no icterus, oral mucosa moist Cardiovascular: No JVD. S1-S2 present, rhythmic, no gallops, rubs, or murmurs. No lower extremity edema. Pulmonary: positive breath sounds bilaterally, Gastrointestinal. Abdomen soft and non tender Skin. No rashes Musculoskeletal: no joint deformities   The results of significant diagnostics from this hospitalization (including imaging, microbiology, ancillary and laboratory) are listed below for reference.     Microbiology: Recent Results (from the past 240 hour(s))  SARS Coronavirus 2 by RT PCR (hospital order, performed in [facility] hospital lab) Nasopharyngeal Nasopharyngeal Swab     Status: None   Collection Time: 11/30/19  5:29 AM   Specimen: Nasopharyngeal Swab  Result Value Ref Range Status   SARS Coronavirus 2 NEGATIVE NEGATIVE Final    Comment: (NOTE) SARS-CoV-2 target nucleic acids are NOT DETECTED.  The SARS-CoV-2 RNA is generally detectable in upper and lower respiratory specimens during the acute phase of infection. The lowest concentration of SARS-CoV-2 viral copies this assay can detect is 250 copies / mL. A negative result does not preclude SARS-CoV-2 infection and should not be used as the sole basis for treatment or other patient management decisions.  A negative result may occur with improper specimen collection / handling,  submission of specimen other than nasopharyngeal swab, presence of viral mutation(s) within the areas targeted by this assay, and inadequate number of viral copies (<250 copies / mL). A negative result must be combined with clinical observations, patient history, and epidemiological information.  Fact Sheet for Patients:   StrictlyIdeas.no  Fact Sheet for Healthcare Providers: BankingDealers.co.za  This test is not yet approved or  cleared by the [place] FDA and has been authorized for detection and/or diagnosis of SARS-CoV-2 by FDA under an Emergency Use Authorization (EUA).  This EUA will remain in effect (meaning this test can be used) for the duration of the COVID-19 declaration under Section 564(b)(1) of the Act, 21 U.S.C. section 360bbb-3(b)(1), unless the authorization is terminated or revoked sooner.  Performed at [facility], [address] [place], [place] [address]      Labs: BNP (last 3 results) No results for input(s): BNP in the last 8760 hours. Basic Metabolic  Panel: Recent Labs  Lab 11/30/19 0336 12/01/19 0609 12/02/19 0644 12/03/19 0430  NA 142 139 139 139  K 3.6 3.5 3.1* 4.0  CL 107 108 104 108  CO2 26 25 27 25   GLUCOSE 93 153* 104* 79  BUN 17 11 18 16   CREATININE 0.63 0.59 0.48 0.45  CALCIUM 8.8* 8.3* 7.9* 7.7*   Liver Function Tests: No results for input(s): AST, ALT, ALKPHOS, BILITOT, PROT, ALBUMIN in the last 168 hours. No results for input(s): LIPASE, AMYLASE in the last 168 hours. No results for input(s): AMMONIA in the last 168 hours. CBC: Recent Labs  Lab 11/30/19 0336 12/01/19 0609 12/02/19 0644 12/03/19 0430  WBC 8.2 16.7* 16.0* 10.3  NEUTROABS 4.6  --   --  7.1  HGB 12.0 10.9* 10.0* 9.4*  HCT 35.4* 32.1* 30.5* 28.9*  MCV 92.4 91.5 95.3 95.7  PLT 180 172 183 163   Cardiac Enzymes: No results for input(s): CKTOTAL, CKMB, CKMBINDEX, TROPONINI in the last 168  hours. BNP: Invalid input(s): POCBNP CBG: No results for input(s): GLUCAP in the last 168 hours. D-Dimer No results for input(s): DDIMER in the last 72 hours. Hgb A1c No results for input(s): HGBA1C in the last 72 hours. Lipid Profile No results for input(s): CHOL, HDL, LDLCALC, TRIG, CHOLHDL, LDLDIRECT in the last 72 hours. Thyroid function studies No results for input(s): TSH, T4TOTAL, T3FREE, THYROIDAB in the last 72 hours.  Invalid input(s): FREET3 Anemia work up No results for input(s): VITAMINB12, FOLATE, FERRITIN, TIBC, IRON, RETICCTPCT in the last 72 hours. Urinalysis    Component Value Date/Time   APPEARANCEUR Clear 09/12/2017 0956   GLUCOSEU Negative 09/12/2017 0956   BILIRUBINUR Negative 09/12/2017 0956   PROTEINUR Negative 09/12/2017 0956   NITRITE Negative 09/12/2017 0956   LEUKOCYTESUR Trace (A) 09/12/2017 0956   Sepsis Labs Invalid input(s): PROCALCITONIN,  WBC,  LACTICIDVEN Microbiology Recent Results (from the past 240 hour(s))  SARS Coronavirus 2 by RT PCR (hospital order, performed in [facility] hospital lab) Nasopharyngeal Nasopharyngeal Swab     Status: None   Collection Time: 11/30/19  5:29 AM   Specimen: Nasopharyngeal Swab  Result Value Ref Range Status   SARS Coronavirus 2 NEGATIVE NEGATIVE Final    Comment: (NOTE) SARS-CoV-2 target nucleic acids are NOT DETECTED.  The SARS-CoV-2 RNA is generally detectable in upper and lower respiratory specimens during the acute phase of infection. The lowest concentration of SARS-CoV-2 viral copies this assay can detect is 250 copies / mL. A negative result does not preclude SARS-CoV-2 infection and should not be used as the sole basis for treatment or other patient management decisions.  A negative result may occur with improper specimen collection / handling, submission of specimen other than nasopharyngeal swab, presence of viral mutation(s) within the areas targeted by this assay, and inadequate number  of viral copies (<250 copies / mL). A negative result must be combined with clinical observations, patient history, and epidemiological information.  Fact Sheet for Patients:   StrictlyIdeas.no  Fact Sheet for Healthcare Providers: BankingDealers.co.za  This test is not yet approved or  cleared by the [place] FDA and has been authorized for detection and/or diagnosis of SARS-CoV-2 by FDA under an Emergency Use Authorization (EUA).  This EUA will remain in effect (meaning this test can be used) for the duration of the COVID-19 declaration under Section 564(b)(1) of the Act, 21 U.S.C. section 360bbb-3(b)(1), unless the authorization is terminated or revoked sooner.  Performed at [facility], [place]  [address] [place], [place] [address]      Time coordinating discharge: 45 minutes  SIGNED:   Tawni Millers, MD  [facility] 12/03/2019, 8:47 AM

## 2019-12-03 NOTE — Progress Notes (Signed)
Subjective: 3 Days Post-Op Procedure(s) (LRB): ARTHROPLASTY BIPOLAR HIP (HEMIARTHROPLASTY) (Right) Patient reports pain as mild.   Patient is well, and has had no acute complaints or problems Denies any CP, SOB, ABD pain. We will continue with physical therapy today.  Patient ambulated was able to ambulate 80 feet yesterday.  Objective: Vital signs in last 24 hours: Temp:  [97.3 F (36.3 C)-98.5 F (36.9 C)] 97.8 F (36.6 C) (08/30 0727) Pulse Rate:  [59-68] 59 (08/30 0727) Resp:  [16-20] 16 (08/30 0727) BP: (112-141)/(56-78) 112/56 (08/30 0727) SpO2:  [94 %-99 %] 97 % (08/30 0727)  Intake/Output from previous day: 08/29 0701 - 08/30 0700 In: -  Out: 400 [Urine:400] Intake/Output this shift: No intake/output data recorded.  Recent Labs    12/01/19 0609 12/02/19 0644 12/03/19 0430  HGB 10.9* 10.0* 9.4*   Recent Labs    12/02/19 0644 12/03/19 0430  WBC 16.0* 10.3  RBC 3.20* 3.02*  HCT 30.5* 28.9*  PLT 183 163   Recent Labs    12/02/19 0644 12/03/19 0430  NA 139 139  K 3.1* 4.0  CL 104 108  CO2 27 25  BUN 18 16  CREATININE 0.48 0.45  GLUCOSE 104* 79  CALCIUM 7.9* 7.7*   No results for input(s): LABPT, INR in the last 72 hours.  EXAM General - Patient is Alert, Appropriate and Oriented Extremity - Neurovascular intact Sensation intact distally Intact pulses distally Dorsiflexion/Plantar flexion intact No cellulitis present Compartment soft Dressing - dressing C/D/I and scant drainage Motor Function - intact, moving foot and toes well on exam.   Past Medical History:  Diagnosis Date  . Arthritis   . Collagen vascular disease (HCC)    RA  . Heartburn   . Hypertension     Assessment/Plan:   3 Days Post-Op Procedure(s) (LRB): ARTHROPLASTY BIPOLAR HIP (HEMIARTHROPLASTY) (Right) Principal Problem:   Closed right hip fracture (HCC) Active Problems:   Hypothyroidism   GERD   Rheumatoid arthritis ([place])   Polymyalgia rheumatica (HCC)    Disorder of bone and cartilage   Aneurysm of ascending aorta (HCC)   Essential hypertension   Peripheral vascular disease (HCC)   Hip fracture (HCC)  Estimated body mass index is 25.82 kg/m as calculated from the following:   Height as of this encounter: 5\' 6"  (1.676 m).   Weight as of this encounter: 72.6 kg. Advance diet Up with therapy    Patient has had a bowel movement. Vital signs stable Pain well controlled Care management to assist with discharge to skilled nursing facility.  Patient to follow-up with [facility] orthopedics in 2 weeks for staple removal and Steri-Strip application Continue with honeycomb dressing until 2-week postop appointment Keep incision site clean and dry Compression stockings bilateral lower extremity x6 weeks, okay to remove at nighttime Lovenox 40 mg subcu daily x14 days  DVT Prophylaxis - Lovenox, TED hose and SCDs Weight-Bearing as tolerated to right leg  J. Cameron Proud, PA-C [place] 12/03/2019, 7:51 AM

## 2019-12-03 NOTE — TOC Progression Note (Signed)
Transition of Care [facility]) - Progression Note    Patient Details  Name: Gloria [name] MRN: [id number] Date of Birth: 05-06-1925  Transition of Care [facility]) CM/SW [place], RN Phone Number: 12/03/2019, 3:51 PM  Clinical Narrative:   Called EMS to transport to [facility], the patient is the 4th in line, the bedside nurse aware    Expected Discharge Plan: [place] Barriers to Discharge: Continued Medical Work up  Expected Discharge Plan and Services Expected Discharge Plan: [place] In-house Referral: Clinical Social Work     Living arrangements for the past 2 months: [place] Expected Discharge Date: 12/03/19                                     Social Determinants of Health (SDOH) Interventions    Readmission Risk Interventions No flowsheet data found.

## 2019-12-04 DIAGNOSIS — M069 Rheumatoid arthritis, unspecified: Secondary | ICD-10-CM

## 2019-12-04 DIAGNOSIS — K219 Gastro-esophageal reflux disease without esophagitis: Secondary | ICD-10-CM

## 2019-12-04 DIAGNOSIS — I10 Essential (primary) hypertension: Secondary | ICD-10-CM | POA: Diagnosis not present

## 2019-12-04 DIAGNOSIS — M84459A Pathological fracture, hip, unspecified, initial encounter for fracture: Secondary | ICD-10-CM | POA: Diagnosis not present

## 2019-12-04 LAB — SURGICAL PATHOLOGY

## 2019-12-07 ENCOUNTER — Telehealth: Payer: Self-pay

## 2019-12-07 ENCOUNTER — Other Ambulatory Visit: Payer: Self-pay

## 2019-12-07 DIAGNOSIS — I712 Thoracic aortic aneurysm, without rupture, unspecified: Secondary | ICD-10-CM

## 2019-12-07 NOTE — Telephone Encounter (Signed)
Patient's daughter, Ellison Hughs, called and left a voice message saying patient fell and broke her hip.  She was not able to keep her appointment for a CTA of the Chest.  She will need to reschedule and have it done in [place].  I called back and left message saying we can reschedule for [facility] if she would like.  Josem Kaufmann is the same #[phone] good for 1 mth from today, 12/07/19.  Thurston Hole., LPN

## 2019-12-07 NOTE — Anesthesia Postprocedure Evaluation (Signed)
Anesthesia Post Note  Patient: Gloria [name]  Procedure(s) Performed: ARTHROPLASTY BIPOLAR HIP (HEMIARTHROPLASTY) (Right Hip)  Patient location during evaluation: PACU Anesthesia Type: Spinal Level of consciousness: awake and alert and oriented Pain management: pain level controlled Vital Signs Assessment: post-procedure vital signs reviewed and stable Respiratory status: spontaneous breathing Cardiovascular status: blood pressure returned to baseline Anesthetic complications: no   No complications documented.   Last Vitals:  Vitals:   12/03/19 0727 12/03/19 1519  BP: (!) 112/56 (!) 120/47  Pulse: (!) 59 (!) 52  Resp: 16 17  Temp: 36.6 C 36.6 C  SpO2: 97% 95%    Last Pain:  Vitals:   12/03/19 0200  TempSrc:   PainSc: Asleep                 Shaiann Mcmanamon

## 2019-12-24 ENCOUNTER — Other Ambulatory Visit: Payer: Medicare PPO

## 2019-12-24 ENCOUNTER — Ambulatory Visit: Payer: Medicare PPO | Admitting: Surgery

## 2019-12-31 ENCOUNTER — Ambulatory Visit: Payer: Medicare PPO | Admitting: Surgery

## 2019-12-31 DIAGNOSIS — F3342 Major depressive disorder, recurrent, in full remission: Secondary | ICD-10-CM | POA: Insufficient documentation

## 2020-01-01 ENCOUNTER — Other Ambulatory Visit: Payer: Self-pay

## 2020-01-01 ENCOUNTER — Ambulatory Visit
Admission: RE | Admit: 2020-01-01 | Discharge: 2020-01-01 | Disposition: A | Discharge status: Home / Self Care | Payer: Medicare PPO | Source: Ambulatory Visit | Attending: Surgery | Admitting: Surgery

## 2020-01-01 DIAGNOSIS — I712 Thoracic aortic aneurysm, without rupture, unspecified: Secondary | ICD-10-CM

## 2020-01-01 MED ORDER — IOHEXOL 350 MG/ML SOLN
75.0000 mL | Freq: Once | INTRAVENOUS | Status: AC | PRN
  Administered 2020-01-01: 75 mL via INTRAVENOUS

## 2020-01-14 ENCOUNTER — Ambulatory Visit: Payer: Medicare PPO | Admitting: Surgery

## 2020-01-14 ENCOUNTER — Encounter: Payer: Self-pay | Admitting: Surgery

## 2020-01-14 ENCOUNTER — Other Ambulatory Visit: Payer: Self-pay

## 2020-01-14 VITALS — BP 112/66 | HR 54 | Temp 97.8°F | Resp 20 | Ht 66.0 in | Wt 153.0 lb

## 2020-01-14 DIAGNOSIS — I712 Thoracic aortic aneurysm, without rupture, unspecified: Secondary | ICD-10-CM

## 2020-01-14 NOTE — Progress Notes (Signed)
Vascular and Vein Specialist of [facility]  Patient name: Gloria [name] MRN: [id number] DOB: [birth year] Sex: female   REASON FOR VISIT:    Follow up  HISOTRY OF PRESENT ILLNESS:   Gloria [name] is a 86 y.o. female The patient presented to the emergency department on 04-11-2019 with chest and back pain. CT angiography identified a intramural hematoma within the descending thoracic aorta with extension into the transverse arch. She was admitted to the ICU for blood pressure and pain control. With adequate blood pressure control, she had persistent pain. A repeat CT scan showed progression of the intramural hematoma and evidence of dissection. Based on her clinical condition and CT scan progression, repair was necessary.  On 04/11/2019 she underwent endovascular repair without coverage of her left subclavian artery.  Her pain improved and she was discharged home.   She recently fell and broke her hip.  She is recovering from that but otherwise is doing well.  She continues to take a statin for hypercholesterolemia.  She is medically managed for hypertension.  She states her blood pressure will occasionally go into the 140s.  PAST MEDICAL HISTORY:   Past Medical History:  Diagnosis Date   Arthritis    Collagen vascular disease (HCC)    RA   Heartburn    Hypertension      FAMILY HISTORY:   Family History  Problem Relation Age of Onset   Kidney disease Neg Hx    Bladder Cancer Neg Hx     SOCIAL HISTORY:   Social History   Tobacco Use   Smoking status: Never Smoker   Smokeless tobacco: Never Used  Substance Use Topics   Alcohol use: No    Alcohol/week: 0.0 standard drinks     ALLERGIES:   Allergies  Allergen Reactions   Codeine Nausea Only   Ibandronate Sodium    Ibandronate Sodium  [Ibandronic Acid] Other (See Comments)    Gi upset   Risedronate Other (See Comments)    Gi upset   Risedronate Sodium     Sulfa Antibiotics Hives     CURRENT MEDICATIONS:   Current Outpatient Medications  Medication Sig Dispense Refill   acetaminophen (TYLENOL) 325 MG tablet Take 2 tablets (650 mg total) by mouth every 4 (four) hours as needed for moderate pain.     amLODipine (NORVASC) 5 MG tablet Take 5 mg by mouth daily.     aspirin EC 81 MG EC tablet Take 1 tablet (81 mg total) by mouth daily at 6 (six) AM. 30 tablet 0   atorvastatin (LIPITOR) 20 MG tablet Take 1 tablet (20 mg total) by mouth daily at 6 PM. 30 tablet 0   carvedilol (COREG) 6.25 MG tablet Take 1 tablet (6.25 mg total) by mouth 2 (two) times daily with a meal. 60 tablet 0   folic acid (FOLVITE) 1 MG tablet TAKE 1 TABLET EVERY DAY     levothyroxine (SYNTHROID, LEVOTHROID) 75 MCG tablet Take 75 mcg by mouth daily.   11   methotrexate (RHEUMATREX) 2.5 MG tablet TAKE 6 TABLETS (15 MG TOTAL) BY MOUTH EVERY 7 (SEVEN) DAYS FOR 168 DAYS     Multiple Vitamin (MULTIVITAMIN WITH MINERALS) TABS tablet Take 1 tablet by mouth daily.     predniSONE (DELTASONE) 5 MG tablet Take 2.5 mg by mouth daily.     traZODone (DESYREL) 50 MG tablet Take 50 mg by mouth at bedtime.     No current facility-administered medications for this visit.  REVIEW OF SYSTEMS:   [X]  denotes positive finding, [ ]  denotes negative finding Cardiac  Comments:  Chest pain or chest pressure:    Shortness of breath upon exertion:    Short of breath when lying flat:    Irregular heart rhythm:        Vascular    Pain in calf, thigh, or hip brought on by ambulation:    Pain in feet at night that wakes you up from your sleep:     Blood clot in your veins:    Leg swelling:         Pulmonary    Oxygen at home:    Productive cough:     Wheezing:         Neurologic    Sudden weakness in arms or legs:     Sudden numbness in arms or legs:     Sudden onset of difficulty speaking or slurred speech:    Temporary loss of vision in one eye:     Problems with dizziness:          Gastrointestinal    Blood in stool:     Vomited blood:         Genitourinary    Burning when urinating:     Blood in urine:        Psychiatric    Major depression:         Hematologic    Bleeding problems:    Problems with blood clotting too easily:        Skin    Rashes or ulcers:        Constitutional    Fever or chills:      PHYSICAL EXAM:   Vitals:   01/14/20 0941  BP: 112/66  Pulse: (!) 54  Resp: 20  Temp: 97.8 F (36.6 C)  SpO2: 96%  Weight: 153 lb (69.4 kg)  Height: 5\' 6"  (1.676 m)    GENERAL: The patient is a well-nourished female, in no acute distress. The vital signs are documented above. CARDIAC: There is a regular rate and rhythm.  VASCULAR: Nonpalpable pedal pulses PULMONARY: Non-labored respirations ABDOMEN: Soft and non-tender with normal pitched bowel sounds.  MUSCULOSKELETAL: There are no major deformities or cyanosis. NEUROLOGIC: No focal weakness or paresthesias are detected. SKIN: There are no ulcers or rashes noted. PSYCHIATRIC: The patient has a normal affect.  STUDIES:   I have reviewed the following CTA:  Vascular:  1. Postsurgical changes after zone 3 to zone 5 thoracic endograft repair without complicating features. There is interval near complete resolution of previously visualized intramural hematoma. 2. Unchanged fusiform aneurysmal changes of the ascending aorta measuring up to 45 mm. Aortic aneurysm NOS (ICD10-I71.9). 3. Corrugated luminal pattern of the bilateral renal and subclavian arteries compatible with findings found in fibromuscular dysplasia. 4. Unchanged high-grade stenosis and possible focal occlusion of the celiac trunk with associated poststenotic dilation. 5. Prominent fibrofatty atherosclerotic changes to the visualized abdominal aorta. Aortic Atherosclerosis (ICD10-I70.0).  Non-Vascular:  1. Interval resolution of previously visualized small left pleural effusion and left basilar  atelectasis. 2. Similar appearing thickening of the gastroesophageal junction with standing column of fluid within the distal esophagus.  MEDICAL ISSUES:   TAAA: Thoracic stent graft remains in good position.  There has been near complete resolution of the prior intramural hematoma.  I have her scheduled for follow-up in 2 years.  Hypertension: I have encouraged her to keep a log of her blood pressure so that she can  better report this and medications can be managed easier.    Leia Alf, MD, FACS [facility] Specialists of [facility] [phone] Pager [phone]

## 2020-02-04 ENCOUNTER — Ambulatory Visit: Payer: Medicare Other | Admitting: Podiatry

## 2020-02-05 DIAGNOSIS — G43909 Migraine, unspecified, not intractable, without status migrainosus: Secondary | ICD-10-CM

## 2020-02-05 HISTORY — DX: Migraine, unspecified, not intractable, without status migrainosus: G43.909

## 2020-02-06 ENCOUNTER — Other Ambulatory Visit: Payer: Self-pay

## 2020-02-06 ENCOUNTER — Ambulatory Visit: Payer: Medicare PPO | Admitting: Podiatry

## 2020-02-06 ENCOUNTER — Encounter: Payer: Self-pay | Admitting: Podiatry

## 2020-02-06 DIAGNOSIS — B351 Tinea unguium: Secondary | ICD-10-CM | POA: Diagnosis not present

## 2020-02-06 DIAGNOSIS — M79672 Pain in left foot: Secondary | ICD-10-CM

## 2020-02-06 DIAGNOSIS — M79675 Pain in left toe(s): Secondary | ICD-10-CM

## 2020-02-06 DIAGNOSIS — L84 Corns and callosities: Secondary | ICD-10-CM

## 2020-02-06 DIAGNOSIS — M79674 Pain in right toe(s): Secondary | ICD-10-CM

## 2020-02-06 DIAGNOSIS — M79671 Pain in right foot: Secondary | ICD-10-CM

## 2020-02-06 NOTE — Patient Instructions (Signed)
Look for urea 40% cream or ointment and apply to the thickened dry skin / calluses. This can be bought over the counter, at a pharmacy or online such as [employer]. This will help keep the calluses soft and less painful

## 2020-02-06 NOTE — Progress Notes (Signed)
  Subjective:  Patient ID: Gloria [name], female    DOB: Nov 19, 1925,  MRN: [id number]  Chief Complaint  Patient presents with  . Callouses    Patient presents today for toenail fungus bilat hallux and callouses bottoms of bilat feet that need trimming  . Nail Problem    86 y.o. female presents with the above complaint. History confirmed with patient.   Objective:  Physical Exam: warm, good capillary refill, no trophic changes or ulcerative lesions, normal DP and PT pulses and normal sensory exam. Onychomycosis of b/l hallux nails. Calluses painful to palpation bilateral SM5, SM4, medial hallux, SM1.  Assessment:   1. Onychomycosis   2. Pain due to onychomycosis of toenails of both feet   3. Callus of foot   4. Pain in both feet      Plan:  Patient was evaluated and treated and all questions answered.  Discussed the etiology and treatment options for the condition in detail with the patient. Educated patient on the topical and oral treatment options for mycotic nails. Recommended debridement of the nails today. Sharp and mechanical debridement performed of all painful and mycotic nails today. Nails debrided in length and thickness using a nail nipper and a mechanical burr to level of comfort. Discussed treatment options including appropriate shoe gear. Follow up as needed for painful nails.  All symptomatic hyperkeratoses were safely debrided with a sterile #15 blade to patient's level of comfort without incident. We discussed preventative and palliative care of these lesions including supportive and accommodative shoegear, padding, prefabricated and custom molded accommodative orthoses, use of a pumice stone and lotions/creams daily.   Return in about 3 months (around 05/08/2020).

## 2020-03-25 ENCOUNTER — Other Ambulatory Visit: Payer: Self-pay

## 2020-03-25 ENCOUNTER — Ambulatory Visit: Payer: Medicare PPO | Admitting: Dermatology

## 2020-03-25 DIAGNOSIS — L578 Other skin changes due to chronic exposure to nonionizing radiation: Secondary | ICD-10-CM

## 2020-03-25 DIAGNOSIS — D1800 Hemangioma unspecified site: Secondary | ICD-10-CM

## 2020-03-25 DIAGNOSIS — L821 Other seborrheic keratosis: Secondary | ICD-10-CM | POA: Diagnosis not present

## 2020-03-25 DIAGNOSIS — L720 Epidermal cyst: Secondary | ICD-10-CM | POA: Diagnosis not present

## 2020-03-25 DIAGNOSIS — L820 Inflamed seborrheic keratosis: Secondary | ICD-10-CM

## 2020-03-25 DIAGNOSIS — L570 Actinic keratosis: Secondary | ICD-10-CM | POA: Diagnosis not present

## 2020-03-25 NOTE — Progress Notes (Signed)
   New Patient Visit  Subjective  Gloria [name] [name] is a 86 y.o. female who presents for the following: Lesions (On the L neck - irregular, irritated) and white papules (On the face - patient would like lesions removed)  She has other areas to be evaluated.  The following portions of the chart were reviewed this encounter and updated as appropriate:   Tobacco  Allergies  Meds  Problems  Med Hx  Surg Hx  Fam Hx     Review of Systems:  No other skin or systemic complaints except as noted in HPI or Assessment and Plan.  Objective  Well appearing patient in no apparent distress; mood and affect are within normal limits.  A focused examination was performed including the face and neck. Relevant physical exam findings are noted in the Assessment and Plan.  Objective  Face: Smooth white papule(s).   Objective  R zygoma x 1: Erythematous thin papules/macules with gritty scale.   Objective  L neck x 2: Erythematous keratotic or waxy stuck-on papule or plaque.   Assessment & Plan  Milia Face  Acne/Milia surgery - Face Procedure risks and benefits were discussed with the patient and verbal consent was obtained. Following prep of the skin on the face with an alcohol swab, extraction of milia was performed with cotton tip applicators following superficial incision made over their surfaces with a #11 surgical blade. Capillary hemostasis was achieved with 20% aluminum chloride solution. Vaseline ointment was applied to each site. The patient tolerated the procedure well.  AK (actinic keratosis) R zygoma x 1  Destruction of lesion - R zygoma x 1 Complexity: simple   Destruction method: cryotherapy   Informed consent: discussed and consent obtained   Timeout:  patient name, date of birth, surgical site, and procedure verified Lesion destroyed using liquid nitrogen: Yes   Region frozen until ice ball extended beyond lesion: Yes   Outcome: patient tolerated procedure well with no  complications   Post-procedure details: wound care instructions given    Inflamed seborrheic keratosis L neck x 2  Destruction of lesion - L neck x 2 Complexity: simple   Destruction method: cryotherapy   Informed consent: discussed and consent obtained   Timeout:  patient name, date of birth, surgical site, and procedure verified Lesion destroyed using liquid nitrogen: Yes   Region frozen until ice ball extended beyond lesion: Yes   Outcome: patient tolerated procedure well with no complications   Post-procedure details: wound care instructions given    Seborrheic Keratoses - Stuck-on, waxy, tan-brown papules and plaques  - Discussed benign etiology and prognosis. - Observe - Call for any changes  Hemangiomas - Red papules - Discussed benign nature - Observe - Call for any changes  Actinic Damage - chronic, secondary to cumulative UV radiation exposure/sun exposure over time - diffuse scaly erythematous macules with underlying dyspigmentation - Recommend daily broad spectrum sunscreen SPF 30+ to sun-exposed areas, reapply every 2 hours as needed.  - Call for new or changing lesions.  Return in about 3 months (around 06/23/2020). For AK follow up.  Luther Redo, CMA, am acting as scribe for Sarina Ser, MD .  Documentation: I have reviewed the above documentation for accuracy and completeness, and I agree with the above.  Sarina Ser, MD

## 2020-04-03 ENCOUNTER — Encounter: Payer: Self-pay | Admitting: Dermatology

## 2020-05-13 ENCOUNTER — Emergency Department
Admission: EM | Admit: 2020-05-13 | Discharge: 2020-05-13 | Disposition: A | Discharge status: Home / Self Care | Payer: Medicare PPO | Attending: Emergency Medicine | Admitting: Emergency Medicine

## 2020-05-13 ENCOUNTER — Other Ambulatory Visit: Payer: Self-pay

## 2020-05-13 ENCOUNTER — Encounter: Payer: Self-pay | Admitting: *Deleted

## 2020-05-13 DIAGNOSIS — E039 Hypothyroidism, unspecified: Secondary | ICD-10-CM | POA: Diagnosis not present

## 2020-05-13 DIAGNOSIS — Z96641 Presence of right artificial hip joint: Secondary | ICD-10-CM | POA: Insufficient documentation

## 2020-05-13 DIAGNOSIS — Z86010 Personal history of colonic polyps: Secondary | ICD-10-CM | POA: Diagnosis not present

## 2020-05-13 DIAGNOSIS — I10 Essential (primary) hypertension: Secondary | ICD-10-CM | POA: Diagnosis not present

## 2020-05-13 DIAGNOSIS — R030 Elevated blood-pressure reading, without diagnosis of hypertension: Secondary | ICD-10-CM | POA: Diagnosis present

## 2020-05-13 DIAGNOSIS — Z7982 Long term (current) use of aspirin: Secondary | ICD-10-CM | POA: Insufficient documentation

## 2020-05-13 DIAGNOSIS — Z79899 Other long term (current) drug therapy: Secondary | ICD-10-CM | POA: Diagnosis not present

## 2020-05-13 LAB — CBC
HCT: 36.6 % (ref 36.0–46.0)
Hemoglobin: 11.8 g/dL — ABNORMAL LOW (ref 12.0–15.0)
MCH: 29.8 pg (ref 26.0–34.0)
MCHC: 32.2 g/dL (ref 30.0–36.0)
MCV: 92.4 fL (ref 80.0–100.0)
Platelets: 188 10*3/uL (ref 150–400)
RBC: 3.96 MIL/uL (ref 3.87–5.11)
RDW: 16.8 % — ABNORMAL HIGH (ref 11.5–15.5)
WBC: 7.6 10*3/uL (ref 4.0–10.5)
nRBC: 0 % (ref 0.0–0.2)

## 2020-05-13 LAB — BASIC METABOLIC PANEL
Anion gap: 9 (ref 5–15)
BUN: 14 mg/dL (ref 8–23)
CO2: 25 mmol/L (ref 22–32)
Calcium: 8.9 mg/dL (ref 8.9–10.3)
Chloride: 106 mmol/L (ref 98–111)
Creatinine, Ser: 0.71 mg/dL (ref 0.44–1.00)
GFR, Estimated: >60 mL/min (ref >60)
Glucose, Bld: 134 mg/dL — ABNORMAL HIGH (ref 70–99)
Potassium: 3.6 mmol/L (ref 3.5–5.1)
Sodium: 140 mmol/L (ref 135–145)

## 2020-05-13 LAB — TROPONIN I (HIGH SENSITIVITY): Troponin I (High Sensitivity): 6 ng/L (ref <18)

## 2020-05-13 MED ORDER — AMLODIPINE BESYLATE 5 MG PO TABS
5.0000 mg | ORAL_TABLET | Freq: Once | ORAL | Status: AC
  Administered 2020-05-13: 5 mg via ORAL
  Filled 2020-05-13: qty 1

## 2020-05-13 MED ORDER — AMLODIPINE BESYLATE 5 MG PO TABS: 5.0000 mg | ORAL_TABLET | Freq: Every day | ORAL | 3 refills | Status: DC

## 2020-05-13 NOTE — ED Provider Notes (Signed)
[facility] Emergency Department Provider Note  ____________________________________________  Time seen: Approximately 9:53 PM  I have reviewed the triage vital signs and the nursing notes.   HISTORY  Chief Complaint Hypertension    HPI Gloria [name] is a 87 y.o. female with past history of hypertension, aortic dissection status post stent placement who comes ED complaining of elevated blood pressure.  Denies any acute symptoms.  She was having an outpatient infusion today and was incidentally noted to have a blood pressure of 981 systolic.  She stopped by her PCP on the way out but they were unable to see her at that time.  She checked her blood pressure multiple times at home this evening and it was persistently 191 systolic.  She had been taken off of her amlodipine about a month ago, and her previous prescription has expired so she was unable to refill and restart the medication and therefore comes to the ED.   Past Medical History:  Diagnosis Date  . Arthritis   . Collagen vascular disease (HCC)    RA  . Heartburn   . Hypertension      Patient Active Problem List   Diagnosis Date Noted  . Migraines 02/05/2020  . Recurrent major depressive disorder, in full remission ([place]) 12/31/2019  . Closed displaced midcervical fracture of right femur ([place]) 12/03/2019  . Status post hip hemiarthroplasty 12/03/2019  . Closed right hip fracture ([place]) 11/30/2019  . Hip fracture ([place]) 11/30/2019  . History of fracture of right hip 11/30/2019  . Acute pain of right shoulder   . Fall   . Immunosuppression ([place]) 05/08/2019  . Intramural hematoma of thoracic aorta ([place]) 04/08/2019  . Thoracic ascending aortic aneurysm ([place]) 04/07/2019  . Anemia 02/14/2018  . Peripheral vascular disease ([place]) 09/14/2017  . Aneurysm of ascending aorta (HCC) 02/14/2015  . Essential hypertension 01/21/2014  . Osteopenia 07/19/2013  . Hypothyroidism 02/01/2007  . GERD 02/01/2007   . Rheumatoid arthritis ([place]) 02/01/2007  . Disorder of bone and cartilage 02/01/2007  . COLONIC POLYPS, HX OF 02/01/2007  . DISEASE, CELIAC 04/05/2002  . Polymyalgia rheumatica ([place]) 04/05/2001     Past Surgical History:  Procedure Laterality Date  . ABDOMINAL HYSTERECTOMY    . BREAST CYST EXCISION    . HIP ARTHROPLASTY Right 11/30/2019   Procedure: ARTHROPLASTY BIPOLAR HIP (HEMIARTHROPLASTY);  Surgeon: Corky Mull, MD;  Location: [facility] ORS;  Service: Orthopedics;  Laterality: Right;  . THORACIC AORTIC ENDOVASCULAR STENT GRAFT N/A 04/11/2019   Procedure: THORACIC AORTIC ENDOVASCULAR STENT GRAFT insertion;  Surgeon: Serafina Mitchell, MD;  Location: [facility] OR;  Service: Vascular;  Laterality: N/A;  . ULTRASOUND GUIDANCE FOR VASCULAR ACCESS Right 04/11/2019   Procedure: Ultrasound Guidance For Vascular Access, right femoral artery;  Surgeon: Serafina Mitchell, MD;  Location: [facility] OR;  Service: Vascular;  Laterality: Right;     Prior to Admission medications   Medication Sig Start Date End Date Taking? Authorizing Provider  acetaminophen (TYLENOL) 325 MG tablet Take 2 tablets (650 mg total) by mouth every 4 (four) hours as needed for moderate pain. 12/03/19   Arrien, Jimmy Picket, MD  amLODipine (NORVASC) 5 MG tablet Take 1 tablet (5 mg total) by mouth daily. 05/13/20 05/08/21  Carrie Mew, MD  aspirin EC 81 MG EC tablet Take 1 tablet (81 mg total) by mouth daily at 6 (six) AM. 04/13/19   Aslam, Loralyn Freshwater, MD  atorvastatin (LIPITOR) 20 MG tablet Take 1 tablet (20 mg total) by mouth daily  at 6 PM. 04/12/19 01/14/20  Harvie Heck, MD  carvedilol (COREG) 6.25 MG tablet Take 1 tablet (6.25 mg total) by mouth 2 (two) times daily with a meal. 04/12/19 01/14/20  Aslam, Loralyn Freshwater, MD  folic acid (FOLVITE) 1 MG tablet TAKE 1 TABLET EVERY DAY 03/28/14   [provider]  levothyroxine (SYNTHROID, LEVOTHROID) 75 MCG tablet Take 75 mcg by mouth daily.  02/01/14   [provider]  methotrexate  (RHEUMATREX) 2.5 MG tablet TAKE 6 TABLETS (15 MG TOTAL) BY MOUTH EVERY 7 (SEVEN) DAYS FOR 168 DAYS 02/23/19   [provider]  Multiple Vitamin (MULTIVITAMIN WITH MINERALS) TABS tablet Take 1 tablet by mouth daily.    [provider]  predniSONE (DELTASONE) 5 MG tablet Take 2.5 mg by mouth daily. Patient not taking: Reported on 03/25/2020 02/23/19   [provider]  traZODone (DESYREL) 50 MG tablet Take 50 mg by mouth at bedtime. 12/31/19   [provider]     Allergies Codeine, Ibandronate sodium, Ibandronate sodium  [ibandronic acid], Risedronate, Risedronate sodium, and Sulfa antibiotics   Family History  Problem Relation Age of Onset  . Kidney disease Neg Hx   . Bladder Cancer Neg Hx     Social History Social History   Tobacco Use  . Smoking status: Never Smoker  . Smokeless tobacco: Never Used  Vaping Use  . Vaping Use: Never used  Substance Use Topics  . Alcohol use: No    Alcohol/week: 0.0 standard drinks  . Drug use: Never    Review of Systems  Constitutional:   No fever or chills.  ENT:   No sore throat. No rhinorrhea. Cardiovascular:   No chest pain or syncope. Respiratory:   No dyspnea or cough. Gastrointestinal:   Negative for abdominal pain, vomiting and diarrhea.  Musculoskeletal:   Negative for focal pain or swelling All other systems reviewed and are negative except as documented above in ROS and HPI.  ____________________________________________   PHYSICAL EXAM:  VITAL SIGNS: ED Triage Vitals  Enc Vitals Group     BP 05/13/20 2015 (!) 193/106     Pulse Rate 05/13/20 2015 72     Resp 05/13/20 2015 16     Temp 05/13/20 2015 (!) 97.5 F (36.4 C)     Temp Source 05/13/20 2015 Oral     SpO2 05/13/20 2015 100 %     Weight 05/13/20 2015 153 lb (69.4 kg)     Height 05/13/20 2015 5\' 6"  (1.676 m)     Head Circumference --      Peak Flow --      Pain Score 05/13/20 2027 2     Pain Loc --      Pain Edu? --       Excl. in [place]? --     Vital signs reviewed, nursing assessments reviewed.   Constitutional:   Alert and oriented. Non-toxic appearance. Eyes:   Conjunctivae are normal. EOMI. ENT      Head:   Normocephalic and atraumatic.      Cardiovascular:   RRR. Symmetric bilateral radial and DP pulses.  No murmurs. Cap refill less than 2 seconds. Respiratory:   Unlabored breathing  Musculoskeletal:   Normal range of motion in all extremities.  No edema. Neurologic:   Normal speech and language.  Motor grossly intact. No acute focal neurologic deficits are appreciated.   ____________________________________________    LABS (pertinent positives/negatives) (all labs ordered are listed, but only abnormal results are displayed) Labs Reviewed  BASIC METABOLIC PANEL - Abnormal; Notable for the following components:      Result Value   Glucose, Bld 134 (*)    All other components within normal limits  CBC - Abnormal; Notable for the following components:   Hemoglobin 11.8 (*)    RDW 16.8 (*)    All other components within normal limits  URINALYSIS, COMPLETE (UACMP) WITH MICROSCOPIC  TROPONIN I (HIGH SENSITIVITY)   ____________________________________________   EKG Interpreted by me Normal sinus rhythm rate of 67, normal axis and intervals.  Poor R wave progression.  Normal ST segments and T waves. ____________________________________________    RADIOLOGY  No results found.  ____________________________________________   PROCEDURES Procedures  ____________________________________________  CLINICAL IMPRESSION / ASSESSMENT AND PLAN / ED COURSE  Pertinent labs & imaging results that were available during my care of the patient were reviewed by me and considered in my medical decision making (see chart for details).  Gloria [name] was evaluated in Emergency Department on 05/13/2020 for the symptoms described in the history of present illness. She was evaluated in the context of  the global COVID-19 pandemic, which necessitated consideration that the patient might be at risk for infection with the SARS-CoV-2 virus that causes COVID-19. Institutional protocols and algorithms that pertain to the evaluation of patients at risk for COVID-19 are in a state of rapid change based on information released by regulatory bodies including the CDC and federal and state organizations. These policies and algorithms were followed during the patient's care in the ED.   Patient presents with elevated blood pressure, needing medication refill.  No acute symptoms.  MAP here is about 110, doubt intracranial hemorrhage or acute vascular event.  Will give a dose of amlodipine here, provide refill to her pharmacy.  Stable for discharge.  Follow-up with PCP in a week for recheck.      ____________________________________________   FINAL CLINICAL IMPRESSION(S) / ED DIAGNOSES    Final diagnoses:  Hypertension, unspecified type     ED Discharge Orders         Ordered    amLODipine (NORVASC) 5 MG tablet  Daily        05/13/20 2152          Portions of this note were generated with dragon dictation software. Dictation errors may occur despite best attempts at proofreading.   Carrie Mew, MD 05/13/20 2156

## 2020-05-13 NOTE — ED Triage Notes (Addendum)
Pt to ED reporting HTN throughout the day today. Pt reports she is scheduled for an echo due to increased fatigue recently but otherwise denies dizziness, CP or SOB. Slight headache reported but no neurologic changes upon arrival to ED today. Pt reports having been out of one of her BP medications for the past week but is unsure which one specifically.

## 2020-05-14 ENCOUNTER — Ambulatory Visit: Payer: Medicare PPO | Admitting: Podiatry

## 2020-05-14 ENCOUNTER — Ambulatory Visit (INDEPENDENT_AMBULATORY_CARE_PROVIDER_SITE_OTHER): Payer: Medicare PPO

## 2020-05-14 ENCOUNTER — Encounter: Payer: Self-pay | Admitting: Podiatry

## 2020-05-14 ENCOUNTER — Other Ambulatory Visit: Payer: Self-pay | Admitting: Podiatry

## 2020-05-14 DIAGNOSIS — M79675 Pain in left toe(s): Secondary | ICD-10-CM

## 2020-05-14 DIAGNOSIS — M79674 Pain in right toe(s): Secondary | ICD-10-CM | POA: Diagnosis not present

## 2020-05-14 DIAGNOSIS — S99922A Unspecified injury of left foot, initial encounter: Secondary | ICD-10-CM

## 2020-05-14 DIAGNOSIS — S92345A Nondisplaced fracture of fourth metatarsal bone, left foot, initial encounter for closed fracture: Secondary | ICD-10-CM

## 2020-05-14 DIAGNOSIS — M79671 Pain in right foot: Secondary | ICD-10-CM

## 2020-05-14 DIAGNOSIS — B351 Tinea unguium: Secondary | ICD-10-CM | POA: Diagnosis not present

## 2020-05-14 DIAGNOSIS — L84 Corns and callosities: Secondary | ICD-10-CM

## 2020-05-14 DIAGNOSIS — M79672 Pain in left foot: Secondary | ICD-10-CM

## 2020-05-14 NOTE — Progress Notes (Signed)
  Subjective:  Patient ID: Gloria [name], female    DOB: 26-May-1925,  MRN: [id number]  Chief Complaint  Patient presents with  . Nail Problem    Nail trim, RFC   She c/o bruising to left 5th toe.  She says she does not remember injuring toe and only bothers her when she wears shoes    87 y.o. female returns with the above complaint. History confirmed with patient.  She has a new issue of bruising on the left fifth toe and dorsal forefoot  Objective:  Physical Exam: warm, good capillary refill, no trophic changes or ulcerative lesions, normal DP and PT pulses and normal sensory exam. Onychomycosis of b/l hallux nails. Calluses painful to palpation bilateral SM5, SM4, medial hallux, SM1.  On the left foot there is bruising and ecchymosis of the fifth toe, dorsal fourth and fifth metatarsal area distally.  She has sharp pain on palpation to the mid diaphysis of the fourth metatarsal.  X-rays of the left foot were taken: Appears to be a partial torus fracture of the distal fourth metatarsal medial cortex diaphysis with possible previous dislocation versus osteoarthritis of the DIPJ of the fifth toe  Assessment:   1. Injury of left toe, initial encounter   2. Onychomycosis   3. Pain due to onychomycosis of toenails of both feet   4. Callus of foot   5. Pain in both feet      Plan:  Patient was evaluated and treated and all questions answered.  Discussed the etiology and treatment options for the condition in detail with the patient. Educated patient on the topical and oral treatment options for mycotic nails. Recommended debridement of the nails today. Sharp and mechanical debridement performed of all painful and mycotic nails today. Nails debrided in length and thickness using a nail nipper and a mechanical burr to level of comfort. Discussed treatment options including appropriate shoe gear. Follow up as needed for painful nails.  All symptomatic hyperkeratoses were safely debrided  with a sterile #15 blade to patient's level of comfort without incident. We discussed preventative and palliative care of these lesions including supportive and accommodative shoegear, padding, prefabricated and custom molded accommodative orthoses, use of a pumice stone and lotions/creams daily.  X-ray reviewed with patient, I recommend nonoperative treatment for this.  I did recommend a surgical shoe for support and stability, she preferred to avoid this.  Discussed the risk with her that this could turn into a complete fracture and requiring full immobilization then.  She would prefer to wearing regular shoes.  Advised her to wear a supportive sneaker such as a new balance shoes she has today and not to have to go barefoot while this is healing.  I would like to reevaluate her in 4 weeks.  If it is not healed then we should wear the surgical shoe at that time.   Return in about 4 weeks (around 06/11/2020) for check left foot.

## 2020-06-11 ENCOUNTER — Ambulatory Visit: Payer: Medicare PPO | Admitting: Podiatry

## 2020-06-19 ENCOUNTER — Ambulatory Visit: Payer: Medicare PPO | Admitting: Dermatology

## 2020-06-25 ENCOUNTER — Ambulatory Visit: Payer: Medicare PPO | Admitting: Dermatology

## 2020-06-25 ENCOUNTER — Encounter: Payer: Self-pay | Admitting: Dermatology

## 2020-06-25 ENCOUNTER — Other Ambulatory Visit: Payer: Self-pay

## 2020-06-25 DIAGNOSIS — L821 Other seborrheic keratosis: Secondary | ICD-10-CM

## 2020-06-25 DIAGNOSIS — L578 Other skin changes due to chronic exposure to nonionizing radiation: Secondary | ICD-10-CM

## 2020-06-25 DIAGNOSIS — Z85828 Personal history of other malignant neoplasm of skin: Secondary | ICD-10-CM | POA: Diagnosis not present

## 2020-06-25 NOTE — Patient Instructions (Addendum)
Seborrheic Keratosis  What causes seborrheic keratoses? Seborrheic keratoses are harmless, common skin growths that first appear during adult life.  As time goes by, more growths appear.  Some people may develop a large number of them.  Seborrheic keratoses appear on both covered and uncovered body parts.  They are not caused by sunlight.  The tendency to develop seborrheic keratoses can be inherited.  They vary in color from skin-colored to gray, brown, or even black.  They can be either smooth or have a rough, warty surface.   Seborrheic keratoses are superficial and look as if they were stuck on the skin.  Under the microscope this type of keratosis looks like layers upon layers of skin.  That is why at times the top layer may seem to fall off, but the rest of the growth remains and re-grows.    Treatment Seborrheic keratoses do not need to be treated, but can easily be removed in the office.  Seborrheic keratoses often cause symptoms when they rub on clothing or jewelry.  Lesions can be in the way of shaving.  If they become inflamed, they can cause itching, soreness, or burning.  Removal of a seborrheic keratosis can be accomplished by freezing, burning, or surgery. If any spot bleeds, scabs, or grows rapidly, please return to have it checked, as these can be an indication of a skin cancer.   If you have any questions or concerns for your doctor, please call our main line at [phone] and press option 4 to reach your doctor's medical assistant. If no one answers, please leave a voicemail as directed and we will return your call as soon as possible. Messages left after 4 pm will be answered the following business day.   You may also send [place] a message via [place]. We typically respond to MyChart messages within 1-2 business days.  For prescription refills, please ask your pharmacy to contact our office. Our fax number is [phone].  If you have an urgent issue when the clinic is closed that  cannot wait until the next business day, you can page your doctor at the number below.    Please note that while we do our best to be available for urgent issues outside of office hours, we are not available 24/7.   If you have an urgent issue and are unable to reach [place], you may choose to seek medical care at your doctor's office, retail clinic, urgent care center, or emergency room.  If you have a medical emergency, please immediately call 911 or go to the emergency department.  Pager Numbers  - Dr. Nehemiah Massed: [phone]  - Dr. Laurence Ferrari: [phone]  - Dr. Nicole Kindred: [phone]  In the event of inclement weather, please call our main line at [phone] for an update on the status of any delays or closures.  Dermatology Medication Tips: Please keep the boxes that topical medications come in in order to help keep track of the instructions about where and how to use these. Pharmacies typically print the medication instructions only on the boxes and not directly on the medication tubes.   If your medication is too expensive, please contact our office at [phone] option 4 or send [place] a message through [place].   We are unable to tell what your co-pay for medications will be in advance as this is different depending on your insurance coverage. However, we may be able to find a substitute medication at lower cost or fill out paperwork to get insurance to  cover a needed medication.   If a prior authorization is required to get your medication covered by your insurance company, please allow [place] 1-2 business days to complete this process.  Drug prices often vary depending on where the prescription is filled and some pharmacies may offer cheaper prices.  The website www.goodrx.com contains coupons for medications through different pharmacies. The prices here do not account for what the cost may be with help from insurance (it may be cheaper with your insurance), but the website can give you the  price if you did not use any insurance.  - You can print the associated coupon and take it with your prescription to the pharmacy.  - You may also stop by our office during regular business hours and pick up a GoodRx coupon card.  - If you need your prescription sent electronically to a different pharmacy, notify our office through [facility] or by phone at [phone] option 4.

## 2020-06-25 NOTE — Progress Notes (Signed)
   Follow-Up Visit   Subjective  Gloria [name] Gloria [name] is a 87 y.o. female who presents for the following: Follow-up (Patient here today for 3 month AK follow up at right zygoma. She is also concerned about a dark, raised area at left cheek, present for about 1 month. ).  The following portions of the chart were reviewed this encounter and updated as appropriate:   Tobacco  Allergies  Meds  Problems  Med Hx  Surg Hx  Fam Hx      Review of Systems:  No other skin or systemic complaints except as noted in HPI or Assessment and Plan.  Objective  Well appearing patient in no apparent distress; mood and affect are within normal limits.  A focused examination was performed including face, inframammary. Relevant physical exam findings are noted in the Assessment and Plan.    Assessment & Plan    Seborrheic Keratoses - Stuck-on, waxy, tan-brown papules and plaques inframammary and left cheek - Discussed benign etiology and prognosis. - Observe - Call for any changes  History of PreCancerous Actinic Keratosis  - site of PreCancerous Actinic Keratosis clear today at right zygoma. - these may recur and new lesions may form requiring treatment to prevent transformation into skin cancer - observe for new or changing spots and contact [place] for appointment if occur - photoprotection with sun protective clothing; sunglasses and broad spectrum sunscreen with SPF of at least 30 + and frequent self skin exams recommended - yearly exams by a dermatologist recommended for persons with history of PreCancerous Actinic Keratoses  Actinic Damage - chronic, secondary to cumulative UV radiation exposure/sun exposure over time - diffuse scaly erythematous macules with underlying dyspigmentation - Recommend daily broad spectrum sunscreen SPF 30+ to sun-exposed areas, reapply every 2 hours as needed.  - Recommend staying in the shade or wearing long sleeves, sun glasses (UVA+UVB  protection) and wide brim hats (4-inch brim around the entire circumference of the hat). - Call for new or changing lesions.  Return for follow up as scheduled.  Gloria [name], RMA, am acting as scribe for Forest Gleason, MD .  Documentation: I have reviewed the above documentation for accuracy and completeness, and I agree with the above.  Forest Gleason, MD

## 2020-07-16 ENCOUNTER — Ambulatory Visit: Payer: Medicare PPO | Admitting: Podiatry

## 2020-07-16 ENCOUNTER — Encounter: Payer: Self-pay | Admitting: Podiatry

## 2020-07-16 ENCOUNTER — Other Ambulatory Visit: Payer: Self-pay

## 2020-07-16 DIAGNOSIS — M79671 Pain in right foot: Secondary | ICD-10-CM

## 2020-07-16 DIAGNOSIS — M79674 Pain in right toe(s): Secondary | ICD-10-CM | POA: Diagnosis not present

## 2020-07-16 DIAGNOSIS — L84 Corns and callosities: Secondary | ICD-10-CM

## 2020-07-16 DIAGNOSIS — B351 Tinea unguium: Secondary | ICD-10-CM

## 2020-07-16 DIAGNOSIS — M79672 Pain in left foot: Secondary | ICD-10-CM

## 2020-07-16 DIAGNOSIS — M79675 Pain in left toe(s): Secondary | ICD-10-CM | POA: Diagnosis not present

## 2020-07-21 NOTE — Progress Notes (Signed)
  Subjective:  Patient ID: Gloria [name], female    DOB: 1925/08/10,  MRN: [id number]  Chief Complaint  Patient presents with  . Toe Injury    "my toe healed up.  It normal now"  She request a nail and callous trim today    87 y.o. female returns with the above complaint. History confirmed with patient.   Objective:  Physical Exam: warm, good capillary refill, no trophic changes or ulcerative lesions, normal DP and PT pulses and normal sensory exam. Onychomycosis of b/l hallux nails. Calluses painful to palpation bilateral SM5, SM4, medial hallux, SM1.  On the left foot there is bruising and ecchymosis of the fifth toe, dorsal fourth and fifth metatarsal area distally.      Assessment:   1. Pain due to onychomycosis of toenails of both feet   2. Callus of foot   3. Pain in both feet      Plan:  Patient was evaluated and treated and all questions answered.  Discussed the etiology and treatment options for the condition in detail with the patient. Educated patient on the topical and oral treatment options for mycotic nails. Recommended debridement of the nails today. Sharp and mechanical debridement performed of all painful and mycotic nails today. Nails debrided in length and thickness using a nail nipper and a mechanical burr to level of comfort. Discussed treatment options including appropriate shoe gear. Follow up as needed for painful nails.  All symptomatic hyperkeratoses were safely debrided with a sterile #15 blade to patient's level of comfort without incident. We discussed preventative and palliative care of these lesions including supportive and accommodative shoegear, padding, prefabricated and custom molded accommodative orthoses, use of a pumice stone and lotions/creams daily.     Return in about 3 months (around 10/15/2020).

## 2020-07-28 ENCOUNTER — Other Ambulatory Visit: Payer: Self-pay

## 2020-07-28 ENCOUNTER — Encounter: Payer: Self-pay | Admitting: Urology

## 2020-07-28 ENCOUNTER — Ambulatory Visit: Payer: Medicare PPO | Admitting: Urology

## 2020-07-28 VITALS — BP 127/72 | HR 64 | Ht 66.0 in | Wt 153.0 lb

## 2020-07-28 DIAGNOSIS — R350 Frequency of micturition: Secondary | ICD-10-CM

## 2020-07-28 DIAGNOSIS — N3946 Mixed incontinence: Secondary | ICD-10-CM

## 2020-07-28 LAB — URINALYSIS, COMPLETE
Bilirubin, UA: NEGATIVE
Glucose, UA: NEGATIVE
Ketones, UA: NEGATIVE
Nitrite, UA: NEGATIVE
Protein,UA: NEGATIVE
Specific Gravity, UA: 1.01 (ref 1.005–1.030)
Urobilinogen, Ur: 0.2 mg/dL (ref 0.2–1.0)
pH, UA: 6.5 (ref 5.0–7.5)

## 2020-07-28 LAB — MICROSCOPIC EXAMINATION: Bacteria, UA: NONE SEEN

## 2020-07-28 LAB — BLADDER SCAN AMB NON-IMAGING: Scan Result: 20

## 2020-07-28 MED ORDER — MIRABEGRON ER 50 MG PO TB24: 50.0000 mg | ORAL_TABLET | Freq: Every day | ORAL | 11 refills | Status: DC

## 2020-07-28 NOTE — Progress Notes (Signed)
07/28/2020 8:40 AM   Gloria [name] [birth year] [id number]  Referring provider: Adin Hector, MD [facility] [place],  [place] [address]  Chief Complaint  Patient presents with  . Urinary Frequency    HPI: Dr. B: Recurrent UTIs and 1 pad a day urge incontinence; Premarin cream and Myrbetriq 25 mg last seen in 2019  Patient currently uses a least 3 pads a day that can be quite wet with urge incontinence.  Having said that her primary complaint is getting up 3-6 times a night.  She started on a sedative type medication recently and she may be getting up 3-4 times now.  She has urge incontinence but no stress incontinence.  She had a bladder surgery or sling 30 years ago  Think she is getting 1 or 2 infections in the last year.  She cannot recall or she does not think she took Myrbetriq or any other medications in the past  Hypermobility the bladder neck and negative cough test.  No prolapse     PMH: Past Medical History:  Diagnosis Date  . Arthritis   . Collagen vascular disease (HCC)    RA  . Heartburn   . Hypertension     Surgical History: Past Surgical History:  Procedure Laterality Date  . ABDOMINAL HYSTERECTOMY    . BREAST CYST EXCISION    . HIP ARTHROPLASTY Right 11/30/2019   Procedure: ARTHROPLASTY BIPOLAR HIP (HEMIARTHROPLASTY);  Surgeon: Corky Mull, MD;  Location: [facility] ORS;  Service: Orthopedics;  Laterality: Right;  . THORACIC AORTIC ENDOVASCULAR STENT GRAFT N/A 04/11/2019   Procedure: THORACIC AORTIC ENDOVASCULAR STENT GRAFT insertion;  Surgeon: Serafina Mitchell, MD;  Location: [facility] OR;  Service: Vascular;  Laterality: N/A;  . ULTRASOUND GUIDANCE FOR VASCULAR ACCESS Right 04/11/2019   Procedure: Ultrasound Guidance For Vascular Access, right femoral artery;  Surgeon: Serafina Mitchell, MD;  Location: [facility];  Service: Vascular;  Laterality: Right;    Home Medications:  Allergies as of 07/28/2020      Reactions   Amoxicillin  Other (See Comments)   intolerant   Codeine Nausea Only   Ibandronate Sodium    Ibandronate Sodium  [ibandronic Acid] Other (See Comments)   Gi upset   Risedronate Other (See Comments)   Gi upset   Risedronate Sodium    Sulfa Antibiotics Hives      Medication List       Accurate as of July 28, 2020  8:40 AM. If you have any questions, ask your nurse or doctor.        acetaminophen 325 MG tablet Commonly known as: TYLENOL Take 2 tablets (650 mg total) by mouth every 4 (four) hours as needed for moderate pain.   amLODipine 5 MG tablet Commonly known as: NORVASC Take 1 tablet (5 mg total) by mouth daily.   aspirin 81 MG EC tablet Take 1 tablet (81 mg total) by mouth daily at 6 (six) AM.   atorvastatin 20 MG tablet Commonly known as: LIPITOR Take 1 tablet (20 mg total) by mouth daily at 6 PM.   carvedilol 6.25 MG tablet Commonly known as: COREG Take 1 tablet (6.25 mg total) by mouth 2 (two) times daily with a meal.   folic acid 1 MG tablet Commonly known as: FOLVITE TAKE 1 TABLET EVERY DAY   levothyroxine 75 MCG tablet Commonly known as: SYNTHROID Take 75 mcg by mouth daily.   methotrexate 2.5 MG tablet Commonly known as: RHEUMATREX TAKE  6 TABLETS (15 MG TOTAL) BY MOUTH EVERY 7 (SEVEN) DAYS FOR 168 DAYS   multivitamin with minerals Tabs tablet Take 1 tablet by mouth daily.   predniSONE 5 MG tablet Commonly known as: DELTASONE Take 2.5 mg by mouth daily.   traZODone 50 MG tablet Commonly known as: DESYREL Take 50 mg by mouth at bedtime.       Allergies:  Allergies  Allergen Reactions  . Amoxicillin Other (See Comments)    intolerant  . Codeine Nausea Only  . Ibandronate Sodium   . Ibandronate Sodium  [Ibandronic Acid] Other (See Comments)    Gi upset  . Risedronate Other (See Comments)    Gi upset  . Risedronate Sodium   . Sulfa Antibiotics Hives    Family History: Family History  Problem Relation Age of Onset  . Kidney disease Neg Hx   .  Bladder Cancer Neg Hx     Social History:  reports that she has never smoked. She has never used smokeless tobacco. She reports that she does not drink alcohol and does not use drugs.  ROS:                                        Physical Exam: There were no vitals taken for this visit.   Laboratory Data: Lab Results  Component Value Date   WBC 7.6 05/13/2020   HGB 11.8 (L) 05/13/2020   HCT 36.6 05/13/2020   MCV 92.4 05/13/2020   PLT 188 05/13/2020    Lab Results  Component Value Date   CREATININE 0.71 05/13/2020    No results found for: PSA  No results found for: TESTOSTERONE  No results found for: HGBA1C  Urinalysis    Component Value Date/Time   APPEARANCEUR Clear 09/12/2017 0956   GLUCOSEU Negative 09/12/2017 0956   BILIRUBINUR Negative 09/12/2017 0956   PROTEINUR Negative 09/12/2017 0956   NITRITE Negative 09/12/2017 0956   LEUKOCYTESUR Trace (A) 09/12/2017 0956    Pertinent Imaging:   Assessment & Plan: Patient has urge incontinence and primarily nocturia that is affecting her quality life.  Causes of nighttime frequency discussed.  Reassess in 6 weeks on Myrbetriq 50 mg samples and prescription  There are no diagnoses linked to this encounter.  No follow-ups on file.  Reece Packer, MD  [place] [address] [place], [place] [address] [phone]

## 2020-07-31 LAB — CULTURE, URINE COMPREHENSIVE

## 2020-08-29 ENCOUNTER — Other Ambulatory Visit: Payer: Self-pay

## 2020-08-29 ENCOUNTER — Ambulatory Visit: Payer: Medicare PPO | Admitting: Physician Assistant

## 2020-08-29 ENCOUNTER — Encounter: Payer: Self-pay | Admitting: Physician Assistant

## 2020-08-29 VITALS — BP 109/68 | HR 59 | Ht 65.0 in | Wt 153.0 lb

## 2020-08-29 DIAGNOSIS — R300 Dysuria: Secondary | ICD-10-CM | POA: Diagnosis not present

## 2020-08-29 LAB — URINALYSIS, COMPLETE

## 2020-08-29 LAB — MICROSCOPIC EXAMINATION

## 2020-08-29 MED ORDER — CIPROFLOXACIN HCL 250 MG PO TABS: 250.0000 mg | ORAL_TABLET | Freq: Two times a day (BID) | ORAL | 0 refills | Status: AC

## 2020-08-29 NOTE — Progress Notes (Signed)
08/29/2020 2:51 PM   Gloria [name] Jul 19, 1925 [id number]  CC: Chief Complaint  Patient presents with  . Dysuria   HPI: Gloria [name] is a 87 y.o. female with PMH recurrent UTI previously on vaginal estrogen and urge incontinence on Myrbetriq 25 mg who presents today for evaluation of possible UTI.  She is accompanied today by her daughter, who contributes to HPI.   Today she reports a 2-day history of urinary frequency, fatigue, and malaise.  She denies dysuria, fever, chills, nausea, vomiting, flank pain, lower abdominal pain, and gross hematuria.  She has been taking Azo for symptomatic relief and notes that this has helped significantly.  She continues Myrbetriq and is no longer on vaginal estrogen.  She states she has had several UTIs in the past year that have been treated by her PCP, Dr. Caryl Comes.  She states Cipro has worked well for her in the past.  In-office UA unreadable due to pigment interference with orange color; urine microscopy with 11-30 WBCs/HPF.  PMH: Past Medical History:  Diagnosis Date  . Arthritis   . Collagen vascular disease (HCC)    RA  . Heartburn   . Hypertension     Surgical History: Past Surgical History:  Procedure Laterality Date  . ABDOMINAL HYSTERECTOMY    . BREAST CYST EXCISION    . HIP ARTHROPLASTY Right 11/30/2019   Procedure: ARTHROPLASTY BIPOLAR HIP (HEMIARTHROPLASTY);  Surgeon: Corky Mull, MD;  Location: [facility] ORS;  Service: Orthopedics;  Laterality: Right;  . THORACIC AORTIC ENDOVASCULAR STENT GRAFT N/A 04/11/2019   Procedure: THORACIC AORTIC ENDOVASCULAR STENT GRAFT insertion;  Surgeon: Serafina Mitchell, MD;  Location: [facility] OR;  Service: Vascular;  Laterality: N/A;  . ULTRASOUND GUIDANCE FOR VASCULAR ACCESS Right 04/11/2019   Procedure: Ultrasound Guidance For Vascular Access, right femoral artery;  Surgeon: Serafina Mitchell, MD;  Location: [facility];  Service: Vascular;  Laterality: Right;    Home Medications:  Allergies as of  08/29/2020      Reactions   Amoxicillin Other (See Comments)   intolerant   Codeine Nausea Only   Ibandronate Sodium    Ibandronate Sodium  [ibandronic Acid] Other (See Comments)   Gi upset   Risedronate Other (See Comments)   Gi upset   Risedronate Sodium    Sulfa Antibiotics Hives      Medication List       Accurate as of Aug 29, 2020  2:51 PM. If you have any questions, ask your nurse or doctor.        acetaminophen 325 MG tablet Commonly known as: TYLENOL Take 2 tablets (650 mg total) by mouth every 4 (four) hours as needed for moderate pain.   amLODipine 5 MG tablet Commonly known as: NORVASC Take 1 tablet (5 mg total) by mouth daily.   aspirin 81 MG EC tablet Take 1 tablet (81 mg total) by mouth daily at 6 (six) AM.   atorvastatin 20 MG tablet Commonly known as: LIPITOR Take 1 tablet (20 mg total) by mouth daily at 6 PM.   carvedilol 6.25 MG tablet Commonly known as: COREG Take 1 tablet (6.25 mg total) by mouth 2 (two) times daily with a meal.   ciprofloxacin 250 MG tablet Commonly known as: CIPRO Take 1 tablet (250 mg total) by mouth 2 (two) times daily for 5 days. Started by: Debroah Loop, PA-C   folic acid 1 MG tablet Commonly known as: FOLVITE TAKE 1 TABLET EVERY DAY   levothyroxine 75 MCG tablet  Commonly known as: SYNTHROID Take 75 mcg by mouth daily.   methotrexate 2.5 MG tablet Commonly known as: RHEUMATREX TAKE 6 TABLETS (15 MG TOTAL) BY MOUTH EVERY 7 (SEVEN) DAYS FOR 168 DAYS   mirabegron ER 50 MG Tb24 tablet Commonly known as: MYRBETRIQ Take 1 tablet (50 mg total) by mouth daily.   multivitamin with minerals Tabs tablet Take 1 tablet by mouth daily.   predniSONE 5 MG tablet Commonly known as: DELTASONE Take 2.5 mg by mouth daily.   QUEtiapine 25 MG tablet Commonly known as: SEROQUEL Take 25 mg by mouth at bedtime.   traZODone 50 MG tablet Commonly known as: DESYREL Take 50 mg by mouth at bedtime.       Allergies:   Allergies  Allergen Reactions  . Amoxicillin Other (See Comments)    intolerant  . Codeine Nausea Only  . Ibandronate Sodium   . Ibandronate Sodium  [Ibandronic Acid] Other (See Comments)    Gi upset  . Risedronate Other (See Comments)    Gi upset  . Risedronate Sodium   . Sulfa Antibiotics Hives    Family History: Family History  Problem Relation Age of Onset  . Kidney disease Neg Hx   . Bladder Cancer Neg Hx     Social History:   reports that she has never smoked. She has never used smokeless tobacco. She reports that she does not drink alcohol and does not use drugs.  Physical Exam: BP 109/68   Pulse (!) 59   Ht 5\' 5"  (1.651 m)   Wt 153 lb (69.4 kg)   BMI 25.46 kg/m   Constitutional:  Alert and oriented, no acute distress, nontoxic appearing HEENT: [place], AT Cardiovascular: No clubbing, cyanosis, or edema Respiratory: Normal respiratory effort, no increased work of breathing Skin: No rashes, bruises or suspicious lesions Neurologic: Grossly intact, no focal deficits, moving all 4 extremities Psychiatric: Normal mood and affect  Laboratory Data: Results for orders placed or performed in visit on 08/29/20  Microscopic Examination   Urine  Result Value Ref Range   WBC, UA 11-30 (A) 0 - 5 /hpf   RBC 0-2 0 - 2 /hpf   Epithelial Cells (non renal) 0-10 0 - 10 /hpf   Renal Epithel, UA 0-10 (A) None seen /hpf   Casts Present (A) None seen /lpf   Cast Type Hyaline casts N/A   Mucus, UA Present Not Estab.   Bacteria, UA Few None seen/Few  Urinalysis, Complete  Result Value Ref Range   Specific Gravity, UA CANCELED    pH, UA CANCELED    Color, UA Orange Yellow   Appearance Ur Hazy (A) Clear   Protein,UA CANCELED    Glucose, UA CANCELED    Ketones, UA CANCELED    Microscopic Examination See below:    Assessment & Plan:   1. Dysuria UA with pyuria today, will start empiric Cipro and send for culture for further evaluation. - Urinalysis, Complete - CULTURE, URINE  COMPREHENSIVE - ciprofloxacin (CIPRO) 250 MG tablet; Take 1 tablet (250 mg total) by mouth 2 (two) times daily for 5 days.  Dispense: 10 tablet; Refill: 0   Return if symptoms worsen or fail to improve.  Debroah Loop, PA-C  [facility] [address], [place], [place] [address] [phone]

## 2020-09-01 LAB — CULTURE, URINE COMPREHENSIVE

## 2020-09-08 ENCOUNTER — Ambulatory Visit: Payer: Self-pay | Admitting: Urology

## 2020-09-29 ENCOUNTER — Ambulatory Visit: Payer: Medicare PPO | Admitting: Urology

## 2020-09-29 ENCOUNTER — Encounter: Payer: Self-pay | Admitting: Urology

## 2020-09-29 ENCOUNTER — Other Ambulatory Visit: Payer: Self-pay

## 2020-09-29 VITALS — BP 117/73 | HR 66 | Ht 67.0 in | Wt 155.0 lb

## 2020-09-29 DIAGNOSIS — N3946 Mixed incontinence: Secondary | ICD-10-CM | POA: Diagnosis not present

## 2020-09-29 MED ORDER — MIRABEGRON ER 50 MG PO TB24: 50.0000 mg | ORAL_TABLET | Freq: Every day | ORAL | 11 refills | Status: DC

## 2020-09-29 NOTE — Progress Notes (Signed)
09/29/2020 8:59 AM   Gloria [name] 1926/02/20 [id number]  Referring provider: Adin Hector, MD [facility] [place],  [place] [address]  Chief Complaint  Patient presents with   Urinary Frequency    HPI: Dr. B: Recurrent UTIs and 1 pad a day urge incontinence; Premarin cream and Myrbetriq 25 mg last seen in 2019   Patient currently uses a least 3 pads a day that can be quite wet with urge incontinence.  Having said that her primary complaint is getting up 3-6 times a night.  She started on a sedative type medication recently and she may be getting up 3-4 times now.  She has urge incontinence but no stress incontinence.  She had a bladder surgery or sling 30 years ago   Think she is getting 1 or 2 infections in the last year.  She cannot recall or she does not think she took Myrbetriq or any other medications in the past   Hypermobility the bladder neck and negative cough test.  No prolapse  Patient has urge incontinence and primarily nocturia that is affecting her quality life.  Causes of nighttime frequency discussed.  Reassess in 6 weeks on Myrbetriq 50 mg samples and prescription  Today  Patient saw the nurse practitioner for possible urinary tract infection on vaginal estrogen and Myrbetriq 25 mg.  Patient reported multiple infections treated by primary care and was treated with ciprofloxacin.  Urine culture was positive and a culture prior to that was negative  Clinically not infected.  Improving urge incontinence.  Significant decrease in nocturia getting up once instead of 3-4 times.        PMH: Past Medical History:  Diagnosis Date   Arthritis    Collagen vascular disease ([place])    RA   Heartburn    Hypertension     Surgical History: Past Surgical History:  Procedure Laterality Date   ABDOMINAL HYSTERECTOMY     BREAST CYST EXCISION     HIP ARTHROPLASTY Right 11/30/2019   Procedure: ARTHROPLASTY BIPOLAR HIP  (HEMIARTHROPLASTY);  Surgeon: Corky Mull, MD;  Location: [facility] ORS;  Service: Orthopedics;  Laterality: Right;   THORACIC AORTIC ENDOVASCULAR STENT GRAFT N/A 04/11/2019   Procedure: THORACIC AORTIC ENDOVASCULAR STENT GRAFT insertion;  Surgeon: Serafina Mitchell, MD;  Location: [facility] OR;  Service: Vascular;  Laterality: N/A;   ULTRASOUND GUIDANCE FOR VASCULAR ACCESS Right 04/11/2019   Procedure: Ultrasound Guidance For Vascular Access, right femoral artery;  Surgeon: Serafina Mitchell, MD;  Location: [facility] OR;  Service: Vascular;  Laterality: Right;    Home Medications:  Allergies as of 09/29/2020       Reactions   Amoxicillin Other (See Comments)   intolerant   Codeine Nausea Only   Ibandronate Sodium    Ibandronate Sodium  [ibandronic Acid] Other (See Comments)   Gi upset   Risedronate Other (See Comments)   Gi upset   Risedronate Sodium    Sulfa Antibiotics Hives        Medication List        Accurate as of September 29, 2020  8:59 AM. If you have any questions, ask your nurse or doctor.          acetaminophen 325 MG tablet Commonly known as: TYLENOL Take 2 tablets (650 mg total) by mouth every 4 (four) hours as needed for moderate pain.   amLODipine 5 MG tablet Commonly known as: NORVASC Take 1 tablet (5 mg total) by mouth  daily.   aspirin 81 MG EC tablet Take 1 tablet (81 mg total) by mouth daily at 6 (six) AM.   atorvastatin 20 MG tablet Commonly known as: LIPITOR Take 1 tablet (20 mg total) by mouth daily at 6 PM.   carvedilol 6.25 MG tablet Commonly known as: COREG Take 1 tablet (6.25 mg total) by mouth 2 (two) times daily with a meal.   folic acid 1 MG tablet Commonly known as: FOLVITE TAKE 1 TABLET EVERY DAY   levothyroxine 75 MCG tablet Commonly known as: SYNTHROID Take 75 mcg by mouth daily.   methotrexate 2.5 MG tablet Commonly known as: RHEUMATREX TAKE 6 TABLETS (15 MG TOTAL) BY MOUTH EVERY 7 (SEVEN) DAYS FOR 168 DAYS   mirabegron ER 50 MG Tb24  tablet Commonly known as: MYRBETRIQ Take 1 tablet (50 mg total) by mouth daily.   multivitamin with minerals Tabs tablet Take 1 tablet by mouth daily.   predniSONE 5 MG tablet Commonly known as: DELTASONE Take 2.5 mg by mouth daily.   QUEtiapine 25 MG tablet Commonly known as: SEROQUEL Take 25 mg by mouth at bedtime.   traZODone 50 MG tablet Commonly known as: DESYREL Take 50 mg by mouth at bedtime.        Allergies:  Allergies  Allergen Reactions   Amoxicillin Other (See Comments)    intolerant   Codeine Nausea Only   Ibandronate Sodium    Ibandronate Sodium  [Ibandronic Acid] Other (See Comments)    Gi upset   Risedronate Other (See Comments)    Gi upset   Risedronate Sodium    Sulfa Antibiotics Hives    Family History: Family History  Problem Relation Age of Onset   Kidney disease Neg Hx    Bladder Cancer Neg Hx     Social History:  reports that she has never smoked. She has never used smokeless tobacco. She reports that she does not drink alcohol and does not use drugs.  ROS:                                        Physical Exam: BP 117/73   Pulse 66   Ht 5\' 7"  (1.702 m)   Wt 70.3 kg   BMI 24.28 kg/m   Constitutional:  Alert and oriented, No acute distress. HEENT: [place] AT, moist mucus membranes.  Trachea midline, no masses.   Laboratory Data: Lab Results  Component Value Date   WBC 7.6 05/13/2020   HGB 11.8 (L) 05/13/2020   HCT 36.6 05/13/2020   MCV 92.4 05/13/2020   PLT 188 05/13/2020    Lab Results  Component Value Date   CREATININE 0.71 05/13/2020    No results found for: PSA  No results found for: TESTOSTERONE  No results found for: HGBA1C  Urinalysis    Component Value Date/Time   APPEARANCEUR Hazy (A) 08/29/2020 1113   GLUCOSEU CANCELED 08/29/2020 1113   BILIRUBINUR Negative 07/28/2020 0902   PROTEINUR CANCELED 08/29/2020 1113   NITRITE Negative 07/28/2020 0902   LEUKOCYTESUR Trace (A) 07/28/2020  0902    Pertinent Imaging:   Assessment & Plan: Renew prescription and see in 1 year.  Better 25 mg 3x11  There are no diagnoses linked to this encounter.  No follow-ups on file.  Reece Packer, MD  [facility] [address] [place], [place] [address] [phone]

## 2020-09-29 NOTE — Addendum Note (Signed)
Addended by: Alvera Novel on: 09/29/2020 09:10 AM   Modules accepted: Orders

## 2020-10-01 ENCOUNTER — Ambulatory Visit: Payer: Medicare PPO | Admitting: Dermatology

## 2020-10-13 ENCOUNTER — Ambulatory Visit: Payer: Medicare PPO | Admitting: Dermatology

## 2020-10-16 ENCOUNTER — Ambulatory Visit: Payer: Medicare PPO | Admitting: Podiatry

## 2020-11-21 ENCOUNTER — Emergency Department: Payer: Medicare PPO

## 2020-11-21 ENCOUNTER — Emergency Department
Admission: EM | Admit: 2020-11-21 | Discharge: 2020-11-22 | Disposition: A | Discharge status: Skilled Nursing Facility | Payer: Medicare PPO | Attending: Emergency Medicine | Admitting: Emergency Medicine

## 2020-11-21 ENCOUNTER — Other Ambulatory Visit: Payer: Self-pay

## 2020-11-21 DIAGNOSIS — W19XXXA Unspecified fall, initial encounter: Secondary | ICD-10-CM | POA: Insufficient documentation

## 2020-11-21 DIAGNOSIS — S3219XA Other fracture of sacrum, initial encounter for closed fracture: Secondary | ICD-10-CM | POA: Insufficient documentation

## 2020-11-21 DIAGNOSIS — E039 Hypothyroidism, unspecified: Secondary | ICD-10-CM | POA: Diagnosis not present

## 2020-11-21 DIAGNOSIS — D72829 Elevated white blood cell count, unspecified: Secondary | ICD-10-CM | POA: Insufficient documentation

## 2020-11-21 DIAGNOSIS — S32592A Other specified fracture of left pubis, initial encounter for closed fracture: Secondary | ICD-10-CM | POA: Insufficient documentation

## 2020-11-21 DIAGNOSIS — Z20822 Contact with and (suspected) exposure to covid-19: Secondary | ICD-10-CM | POA: Insufficient documentation

## 2020-11-21 DIAGNOSIS — G43909 Migraine, unspecified, not intractable, without status migrainosus: Secondary | ICD-10-CM | POA: Insufficient documentation

## 2020-11-21 DIAGNOSIS — Z96641 Presence of right artificial hip joint: Secondary | ICD-10-CM | POA: Diagnosis not present

## 2020-11-21 DIAGNOSIS — M25552 Pain in left hip: Secondary | ICD-10-CM

## 2020-11-21 DIAGNOSIS — Z7982 Long term (current) use of aspirin: Secondary | ICD-10-CM | POA: Diagnosis not present

## 2020-11-21 DIAGNOSIS — S3994XA Unspecified injury of external genitals, initial encounter: Secondary | ICD-10-CM | POA: Diagnosis present

## 2020-11-21 DIAGNOSIS — Z79899 Other long term (current) drug therapy: Secondary | ICD-10-CM | POA: Diagnosis not present

## 2020-11-21 DIAGNOSIS — I10 Essential (primary) hypertension: Secondary | ICD-10-CM | POA: Insufficient documentation

## 2020-11-21 LAB — URINALYSIS, COMPLETE (UACMP) WITH MICROSCOPIC
Bilirubin Urine: NEGATIVE
Glucose, UA: NEGATIVE mg/dL
Hgb urine dipstick: NEGATIVE
Ketones, ur: NEGATIVE mg/dL
Leukocytes,Ua: NEGATIVE
Nitrite: NEGATIVE
Protein, ur: NEGATIVE mg/dL
Specific Gravity, Urine: 1.008 (ref 1.005–1.030)
pH: 8 (ref 5.0–8.0)

## 2020-11-21 LAB — CBC WITH DIFFERENTIAL/PLATELET
Abs Immature Granulocytes: 0.15 10*3/uL — ABNORMAL HIGH (ref 0.00–0.07)
Basophils Absolute: 0 10*3/uL (ref 0.0–0.1)
Basophils Relative: 0 %
Eosinophils Absolute: 0.2 10*3/uL (ref 0.0–0.5)
Eosinophils Relative: 1 %
HCT: 36.8 % (ref 36.0–46.0)
Hemoglobin: 12.1 g/dL (ref 12.0–15.0)
Immature Granulocytes: 1 %
Lymphocytes Relative: 12 %
Lymphs Abs: 1.8 10*3/uL (ref 0.7–4.0)
MCH: 31.5 pg (ref 26.0–34.0)
MCHC: 32.9 g/dL (ref 30.0–36.0)
MCV: 95.8 fL (ref 80.0–100.0)
Monocytes Absolute: 1.1 10*3/uL — ABNORMAL HIGH (ref 0.1–1.0)
Monocytes Relative: 7 %
Neutro Abs: 12.2 10*3/uL — ABNORMAL HIGH (ref 1.7–7.7)
Neutrophils Relative %: 79 %
Platelets: 151 10*3/uL (ref 150–400)
RBC: 3.84 MIL/uL — ABNORMAL LOW (ref 3.87–5.11)
RDW: 16.3 % — ABNORMAL HIGH (ref 11.5–15.5)
WBC: 15.4 10*3/uL — ABNORMAL HIGH (ref 4.0–10.5)
nRBC: 0 % (ref 0.0–0.2)

## 2020-11-21 LAB — SAMPLE TO BLOOD BANK

## 2020-11-21 LAB — RESP PANEL BY RT-PCR (FLU A&B, COVID) ARPGX2
Influenza A by PCR: NEGATIVE
Influenza B by PCR: NEGATIVE
SARS Coronavirus 2 by RT PCR: NEGATIVE

## 2020-11-21 LAB — BASIC METABOLIC PANEL
Anion gap: 7 (ref 5–15)
BUN: 14 mg/dL (ref 8–23)
CO2: 28 mmol/L (ref 22–32)
Calcium: 8.8 mg/dL — ABNORMAL LOW (ref 8.9–10.3)
Chloride: 106 mmol/L (ref 98–111)
Creatinine, Ser: 0.7 mg/dL (ref 0.44–1.00)
GFR, Estimated: >60 mL/min (ref >60)
Glucose, Bld: 92 mg/dL (ref 70–99)
Potassium: 3.3 mmol/L — ABNORMAL LOW (ref 3.5–5.1)
Sodium: 141 mmol/L (ref 135–145)

## 2020-11-21 LAB — PROTIME-INR
INR: 1.1 (ref 0.8–1.2)
Prothrombin Time: 13.7 seconds (ref 11.4–15.2)

## 2020-11-21 MED ORDER — FENTANYL CITRATE (PF) 100 MCG/2ML IJ SOLN
50.0000 ug | Freq: Once | INTRAMUSCULAR | Status: AC
  Administered 2020-11-21: 50 ug via INTRAVENOUS
  Filled 2020-11-21: qty 2

## 2020-11-21 MED ORDER — ATORVASTATIN CALCIUM 20 MG PO TABS: 20.0000 mg | ORAL_TABLET | Freq: Every day | ORAL | Status: DC

## 2020-11-21 MED ORDER — ONDANSETRON HCL 4 MG/2ML IJ SOLN
4.0000 mg | Freq: Once | INTRAMUSCULAR | Status: AC
  Administered 2020-11-21: 4 mg via INTRAVENOUS
  Filled 2020-11-21: qty 2

## 2020-11-21 MED ORDER — METHOCARBAMOL 500 MG PO TABS
500.0000 mg | ORAL_TABLET | Freq: Once | ORAL | Status: AC
  Administered 2020-11-21: 500 mg via ORAL
  Filled 2020-11-21: qty 1

## 2020-11-21 MED ORDER — MIRABEGRON ER 50 MG PO TB24
50.0000 mg | ORAL_TABLET | Freq: Every day | ORAL | Status: DC
  Administered 2020-11-21: 50 mg via ORAL
  Filled 2020-11-21 (×3): qty 1

## 2020-11-21 MED ORDER — OXYCODONE HCL 5 MG PO TABS
5.0000 mg | ORAL_TABLET | Freq: Once | ORAL | Status: AC
  Administered 2020-11-21: 5 mg via ORAL
  Filled 2020-11-21: qty 1

## 2020-11-21 MED ORDER — ACETAMINOPHEN 500 MG PO TABS
1000.0000 mg | ORAL_TABLET | Freq: Once | ORAL | Status: AC
  Administered 2020-11-21: 1000 mg via ORAL
  Filled 2020-11-21: qty 2

## 2020-11-21 MED ORDER — HYDROCODONE-ACETAMINOPHEN 5-325 MG PO TABS
1.0000 | ORAL_TABLET | Freq: Once | ORAL | Status: AC
  Administered 2020-11-21: 1 via ORAL
  Filled 2020-11-21: qty 1

## 2020-11-21 MED ORDER — ASPIRIN EC 81 MG PO TBEC
81.0000 mg | DELAYED_RELEASE_TABLET | Freq: Every day | ORAL | Status: DC
  Administered 2020-11-22: 81 mg via ORAL
  Filled 2020-11-21: qty 1

## 2020-11-21 MED ORDER — AMLODIPINE BESYLATE 5 MG PO TABS
5.0000 mg | ORAL_TABLET | Freq: Every day | ORAL | Status: DC
  Administered 2020-11-21 – 2020-11-22 (×2): 5 mg via ORAL
  Filled 2020-11-21 (×2): qty 1

## 2020-11-21 MED ORDER — QUETIAPINE FUMARATE 25 MG PO TABS
25.0000 mg | ORAL_TABLET | Freq: Every day | ORAL | Status: DC
  Administered 2020-11-21: 25 mg via ORAL
  Filled 2020-11-21: qty 1

## 2020-11-21 MED ORDER — CARVEDILOL 6.25 MG PO TABS
3.1250 mg | ORAL_TABLET | Freq: Two times a day (BID) | ORAL | Status: DC
  Administered 2020-11-22: 3.125 mg via ORAL
  Filled 2020-11-21: qty 1

## 2020-11-21 MED ORDER — SODIUM CHLORIDE 0.9 % IV SOLN: INTRAVENOUS | Status: AC

## 2020-11-21 MED ORDER — PREDNISONE 2.5 MG PO TABS
2.5000 mg | ORAL_TABLET | Freq: Every day | ORAL | Status: DC
  Administered 2020-11-21 – 2020-11-22 (×2): 2.5 mg via ORAL
  Filled 2020-11-21 (×2): qty 1

## 2020-11-21 MED ORDER — LEVOTHYROXINE SODIUM 50 MCG PO TABS: 75.0000 ug | ORAL_TABLET | Freq: Every day | ORAL | Status: DC

## 2020-11-21 MED ORDER — METHOTREXATE 2.5 MG PO TABS
15.0000 mg | ORAL_TABLET | Freq: Once | ORAL | Status: AC
  Administered 2020-11-21: 15 mg via ORAL
  Filled 2020-11-21 (×2): qty 6

## 2020-11-21 MED ORDER — TRAZODONE HCL 50 MG PO TABS
50.0000 mg | ORAL_TABLET | Freq: Every day | ORAL | Status: DC
  Administered 2020-11-21: 50 mg via ORAL
  Filled 2020-11-21: qty 1

## 2020-11-21 MED ORDER — LEVOTHYROXINE SODIUM 50 MCG PO TABS
75.0000 ug | ORAL_TABLET | Freq: Every day | ORAL | Status: DC
  Administered 2020-11-22: 75 ug via ORAL
  Filled 2020-11-21: qty 2

## 2020-11-21 NOTE — Evaluation (Signed)
Physical Therapy Evaluation Patient Details Name: Gloria [name] MRN: [id number] DOB: 08/10/1925 Today's Date: 11/21/2020   History of Present Illness  87 y.o. female with history of polymyalgia rheumatica on prednisone, hypertension, hyperlipidemia, thoracic aortic aneurysm who presents to the emergency department EMS from [facility] independent living after she had a fall.  Pt had a fall last August with R hip fx and subsequent replacement.  Clinical Impression  Pt pleasant and eager to do what she could but frankly was very pain limited with any activity involving the L pelvis/LE.  She needed heavy assist with getting to sitting and then on attempt to get to standing she was able to initiate some upward movement but attempts at any WBing on the L were too pain inducing and she was unable to attain upright.  Pt did display some strength in L UE but was again very pain limited.  Pt clearly unable to return to ILF and will need STR to regain strength and mobility.      Follow Up Recommendations SNF;Supervision/Assistance - 24 hour    Equipment Recommendations  None recommended by PT    Recommendations for Other Services       Precautions / Restrictions Precautions Precautions: Fall Restrictions Weight Bearing Restrictions: No      Mobility  Bed Mobility Overal bed mobility: Needs Assistance Bed Mobility: Supine to Sit;Sit to Supine     Supine to sit: Max assist;Mod assist Sit to supine: Max assist   General bed mobility comments: Pt was able to get R LE moving toward EOB but struggled with a lot of pain with L hip movement and ultimately needed heavy assist with transition to and from sitting due to pain    Transfers Overall transfer level: Needs assistance Equipment used: Rolling walker (2 wheeled) Transfers: Sit to/from Stand Sit to Stand: Mod assist;Max assist         General transfer comment: With plenty of cuing for postioning/set up she was able to initiate  upward movement, getting hips ~3" off EOB but she clearly was unable to put much weight through the L LE and stated having too much pain to continue upward movement (Even with significant unweighting assist from PT).  Ultimately we could not attain full upright.  Ambulation/Gait             General Gait Details: in appropriate due to pain  Stairs            Wheelchair Mobility    Modified Rankin (Stroke Patients Only)       Balance Overall balance assessment: Needs assistance Sitting-balance support: Bilateral upper extremity supported Sitting balance-Leahy Scale: Fair Sitting balance - Comments: Pt initially leaning away from L hip/pelvis WBing but did show good effort with poor tolerance to shift L                                     Pertinent Vitals/Pain Pain Assessment: 0-10 Pain Score: 9  Pain Location: L hip/pelvis    Home Living Family/patient expects to be discharged to:: Skilled nursing facility                      Prior Function Level of Independence: Independent         Comments: Pt has AD, but apparently has generally been able to ambulate ad lib w/o AD at [facility]  Extremity/Trunk Assessment   Upper Extremity Assessment Upper Extremity Assessment: Generalized weakness (h/o b/l shld R>L arthritis/pain)    Lower Extremity Assessment Lower Extremity Assessment:  (R grossly weak but functional, L with great effort but very pain limited with all L hip and knee movement - grossly 2/5 2/2 pain)       Communication   Communication: No difficulties  Cognition Arousal/Alertness: Awake/alert Behavior During Therapy: WFL for tasks assessed/performed Overall Cognitive Status: Within Functional Limits for tasks assessed                                        General Comments General comments (skin integrity, edema, etc.): Pt with great effort/willingness to work with PT but was very  pain limited with any L hip activity    Exercises     Assessment/Plan    PT Assessment Patient needs continued PT services  PT Problem List Decreased strength;Decreased range of motion;Decreased activity tolerance;Decreased balance;Decreased mobility;Decreased knowledge of use of DME;Decreased safety awareness;Pain       PT Treatment Interventions DME instruction;Gait training;Functional mobility training;Therapeutic activities;Therapeutic exercise;Balance training;Neuromuscular re-education;Patient/family education    PT Goals (Current goals can be found in the Care Plan section)  Acute Rehab PT Goals Patient Stated Goal: Control pain and get back to walkign PT Goal Formulation: With patient Time For Goal Achievement: 12/05/20 Potential to Achieve Goals: Fair    Frequency Min 2X/week   Barriers to discharge        Co-evaluation               AM-PAC PT "6 Clicks" Mobility  Outcome Measure Help needed turning from your back to your side while in a flat bed without using bedrails?: A Lot Help needed moving from lying on your back to sitting on the side of a flat bed without using bedrails?: A Lot Help needed moving to and from a bed to a chair (including a wheelchair)?: Total Help needed standing up from a chair using your arms (e.g., wheelchair or bedside chair)?: Total Help needed to walk in hospital room?: Total Help needed climbing 3-5 steps with a railing? : Total 6 Click Score: 8    End of Session Equipment Utilized During Treatment: Gait belt Activity Tolerance: Patient limited by pain Patient left: in bed;with call bell/phone within reach;with family/visitor present Nurse Communication: Mobility status PT Visit Diagnosis: Muscle weakness (generalized) (M62.81);Unsteadiness on feet (R26.81);History of falling (Z91.81);Difficulty in walking, not elsewhere classified (R26.2);Pain Pain - Right/Left: Left Pain - part of body: Hip    Time: 1332-1401 PT Time  Calculation (min) (ACUTE ONLY): 29 min   Charges:   PT Evaluation $PT Eval Low Complexity: 1 Low PT Treatments $Therapeutic Activity: 8-22 mins        Kreg Shropshire, DPT 11/21/2020, 2:17 PM

## 2020-11-21 NOTE — ED Triage Notes (Signed)
Pt is from [facility] facility and had an unwitnessed fall walking to the bathroom. Pt had a right hip fracture 3 months ago and is now endorsing 10/10 pain of the left hip - notable bruising on the left hip. No other complaints at this time.

## 2020-11-21 NOTE — ED Notes (Signed)
Vicodin crushed in applesauce per request. Daughter at bedside. Denies any further needs. Pt's bed repositioned. Call bell in reach.

## 2020-11-21 NOTE — ED Provider Notes (Signed)
[facility] Emergency Department Provider Note ____________________________________________   Event Date/Time   First MD Initiated Contact with Patient 11/21/20 0622     (approximate)  I have reviewed the triage vital signs and the nursing notes.   HISTORY  Chief Complaint Fall and Hip Pain (left)    HPI Gloria [name] is a 87 y.o. female with history of polymyalgia rheumatica on prednisone, hypertension, hyperlipidemia, thoracic aortic aneurysm who presents to the emergency department EMS from [facility] independent living after she had a fall tonight.  She is not sure why she fell.  States she landed on her left side and is having left hip, knee and foot pain.  Unclear if she hit her head.  No loss of consciousness.  Not on blood thinners.  States she was not using a cane or walker when she was ambulating.  She denies any chest pain, shortness of breath, dizziness, fevers, cough, vomiting, diarrhea or symptoms that led to her fall tonight.         Past Medical History:  Diagnosis Date   Arthritis    Collagen vascular disease ([place])    RA   Heartburn    Hypertension     Patient Active Problem List   Diagnosis Date Noted   Migraines 02/05/2020   Recurrent major depressive disorder, in full remission ([place]) 12/31/2019   Closed displaced midcervical fracture of right femur ([place]) 12/03/2019   Status post hip hemiarthroplasty 12/03/2019   Closed right hip fracture ([place]) 11/30/2019   Hip fracture ([place]) 11/30/2019   History of fracture of right hip 11/30/2019   Acute pain of right shoulder    Fall    Immunosuppression ([place]) 05/08/2019   Intramural hematoma of thoracic aorta ([place]) 04/08/2019   Thoracic ascending aortic aneurysm ([place]) 04/07/2019   Anemia 02/14/2018   Peripheral vascular disease ([place]) 09/14/2017   Aneurysm of ascending aorta ([place]) 02/14/2015   Essential hypertension 01/21/2014   Osteopenia 07/19/2013   Hypothyroidism 02/01/2007    GERD 02/01/2007   Rheumatoid arthritis ([place]) 02/01/2007   Disorder of bone and cartilage 02/01/2007   COLONIC POLYPS, HX OF 02/01/2007   DISEASE, CELIAC 04/05/2002   Polymyalgia rheumatica ([place]) 04/05/2001    Past Surgical History:  Procedure Laterality Date   ABDOMINAL HYSTERECTOMY     BREAST CYST EXCISION     HIP ARTHROPLASTY Right 11/30/2019   Procedure: ARTHROPLASTY BIPOLAR HIP (HEMIARTHROPLASTY);  Surgeon: Corky Mull, MD;  Location: [facility] ORS;  Service: Orthopedics;  Laterality: Right;   THORACIC AORTIC ENDOVASCULAR STENT GRAFT N/A 04/11/2019   Procedure: THORACIC AORTIC ENDOVASCULAR STENT GRAFT insertion;  Surgeon: Serafina Mitchell, MD;  Location: [facility] OR;  Service: Vascular;  Laterality: N/A;   ULTRASOUND GUIDANCE FOR VASCULAR ACCESS Right 04/11/2019   Procedure: Ultrasound Guidance For Vascular Access, right femoral artery;  Surgeon: Serafina Mitchell, MD;  Location: [facility] OR;  Service: Vascular;  Laterality: Right;    Prior to Admission medications   Medication Sig Start Date End Date Taking? Authorizing Provider  acetaminophen (TYLENOL) 325 MG tablet Take 2 tablets (650 mg total) by mouth every 4 (four) hours as needed for moderate pain. 12/03/19   Arrien, Jimmy Picket, MD  amLODipine (NORVASC) 5 MG tablet Take 1 tablet (5 mg total) by mouth daily. 05/13/20 05/08/21  Carrie Mew, MD  aspirin EC 81 MG EC tablet Take 1 tablet (81 mg total) by mouth daily at 6 (six) AM. 04/13/19   Harvie Heck, MD  atorvastatin (LIPITOR) 20  MG tablet Take 1 tablet (20 mg total) by mouth daily at 6 PM. 04/12/19 01/14/20  Harvie Heck, MD  carvedilol (COREG) 6.25 MG tablet Take 1 tablet (6.25 mg total) by mouth 2 (two) times daily with a meal. 04/12/19 01/14/20  Aslam, Loralyn Freshwater, MD  folic acid (FOLVITE) 1 MG tablet TAKE 1 TABLET EVERY DAY 03/28/14   [provider]  levothyroxine (SYNTHROID, LEVOTHROID) 75 MCG tablet Take 75 mcg by mouth daily.  02/01/14   [provider]  methotrexate  (RHEUMATREX) 2.5 MG tablet TAKE 6 TABLETS (15 MG TOTAL) BY MOUTH EVERY 7 (SEVEN) DAYS FOR 168 DAYS 02/23/19   [provider]  mirabegron ER (MYRBETRIQ) 50 MG TB24 tablet Take 1 tablet (50 mg total) by mouth daily. 09/29/20   Bjorn Loser, MD  Multiple Vitamin (MULTIVITAMIN WITH MINERALS) TABS tablet Take 1 tablet by mouth daily.    [provider]  predniSONE (DELTASONE) 5 MG tablet Take 2.5 mg by mouth daily. 02/23/19   [provider]  QUEtiapine (SEROQUEL) 25 MG tablet Take 25 mg by mouth at bedtime.    [provider]  traZODone (DESYREL) 50 MG tablet Take 50 mg by mouth at bedtime. 12/31/19   [provider]    Allergies Amoxicillin, Codeine, Ibandronate sodium, Ibandronate sodium  [ibandronic acid], Risedronate, Risedronate sodium, and Sulfa antibiotics  Family History  Problem Relation Age of Onset   Kidney disease Neg Hx    Bladder Cancer Neg Hx     Social History Social History   Tobacco Use   Smoking status: Never   Smokeless tobacco: Never  Vaping Use   Vaping Use: Never used  Substance Use Topics   Alcohol use: No    Alcohol/week: 0.0 standard drinks   Drug use: Never    Review of Systems Constitutional: No fever. Eyes: No visual changes. ENT: No sore throat. Cardiovascular: Denies chest pain. Respiratory: Denies shortness of breath. Gastrointestinal: No nausea, vomiting, diarrhea. Genitourinary: Negative for dysuria. Musculoskeletal: Negative for back pain. Skin: Negative for rash. Neurological: Negative for focal weakness or numbness.   ____________________________________________   PHYSICAL EXAM:  VITAL SIGNS: ED Triage Vitals [11/21/20 0628]  Enc Vitals Group     BP 137/82     Pulse Rate 77     Resp 20     Temp 98.6 F (37 C)     Temp Source Oral     SpO2 94 %     Weight      Height      Head Circumference      Peak Flow      Pain Score 10     Pain Loc      Pain Edu?      Excl. in [place]?     CONSTITUTIONAL: Alert and oriented and responds appropriately to questions. Well-appearing; well-nourished; GCS 15 HEAD: Normocephalic; atraumatic EYES: Conjunctivae clear, PERRL, EOMI ENT: normal nose; no rhinorrhea; moist mucous membranes; pharynx without lesions noted; no dental injury; no septal hematoma NECK: Supple, no meningismus, no LAD; no midline spinal tenderness, step-off or deformity; trachea midline CARD: RRR; S1 and S2 appreciated; no murmurs, no clicks, no rubs, no gallops RESP: Normal chest excursion without splinting or tachypnea; breath sounds clear and equal bilaterally; no wheezes, no rhonchi, no rales; no hypoxia or respiratory distress CHEST:  chest wall stable, no crepitus or ecchymosis or deformity, nontender to palpation; no flail chest ABD/GI: Normal bowel sounds; non-distended; soft, non-tender, no rebound, no guarding; no ecchymosis or other  lesions noted PELVIS:  stable, nontender to palpation BACK:  The back appears normal and is non-tender to palpation, there is no CVA tenderness; no midline spinal tenderness, step-off or deformity EXT: Patient has pain with any range of motion of the right hip.  No leg length discrepancy or rotation.  She is also tender over the right knee without bony deformity, joint effusion, ecchymosis or soft tissue swelling.  She is tender over the right foot with soft tissue swelling and ecchymosis to the dorsal foot.  2+ DP pulses bilaterally.  Compartments soft.  Otherwise extremities nontender to palpation. SKIN: Normal color for age and race; warm NEURO: Moves all extremities equally, normal speech, no facial asymmetry PSYCH: The patient's mood and manner are appropriate. Grooming and personal hygiene are appropriate.  ____________________________________________   LABS (all labs ordered are listed, but only abnormal results are displayed)  Labs Reviewed  CBC WITH DIFFERENTIAL/PLATELET - Abnormal; Notable for the following  components:      Result Value   WBC 15.4 (*)    RBC 3.84 (*)    RDW 16.3 (*)    Neutro Abs 12.2 (*)    Monocytes Absolute 1.1 (*)    Abs Immature Granulocytes 0.15 (*)    All other components within normal limits  BASIC METABOLIC PANEL - Abnormal; Notable for the following components:   Potassium 3.3 (*)    Calcium 8.8 (*)    All other components within normal limits  RESP PANEL BY RT-PCR (FLU A&B, COVID) ARPGX2  PROTIME-INR  SAMPLE TO BLOOD BANK   ____________________________________________  EKG  pending ____________________________________________  RADIOLOGY I, Christophr Calix, personally viewed and evaluated these images (plain radiographs) as part of my medical decision making, as well as reviewing the written report by the radiologist.  ED MD interpretation: CT head and cervical spine show no acute traumatic injury.  Official radiology report(s): CT HEAD WO CONTRAST (5MM)  Result Date: 11/21/2020 CLINICAL DATA:  Neck trauma.  Unwitnessed fall. EXAM: CT HEAD WITHOUT CONTRAST CT CERVICAL SPINE WITHOUT CONTRAST TECHNIQUE: Multidetector CT imaging of the head and cervical spine was performed following the standard protocol without intravenous contrast. Multiplanar CT image reconstructions of the cervical spine were also generated. COMPARISON:  11/30/2019 FINDINGS: CT HEAD FINDINGS Brain: No evidence of acute infarction, hemorrhage, hydrocephalus, extra-axial collection or mass lesion/mass effect. 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 Vascular: No hyperdense vessel or unexpected calcification. Skull: Normal. Negative for fracture or focal lesion. Sinuses/Orbits: No evidence of injury CT CERVICAL SPINE FINDINGS Alignment: No traumatic malalignment Skull base and vertebrae: No acute fracture. No primary bone lesion or focal pathologic process. Soft tissues and spinal canal: No prevertebral fluid or swelling. No visible canal hematoma.  Disc levels:  Ordinary degenerative changes. Upper chest: No evidence of injury IMPRESSION: No evidence of acute intracranial or cervical spine injury. Electronically Signed   By: Monte Fantasia M.D.   On: 11/21/2020 07:24   CT Cervical Spine Wo Contrast  Result Date: 11/21/2020 CLINICAL DATA:  Neck trauma.  Unwitnessed fall. EXAM: CT HEAD WITHOUT CONTRAST CT CERVICAL SPINE WITHOUT CONTRAST TECHNIQUE: Multidetector CT imaging of the head and cervical spine was performed following the standard protocol without intravenous contrast. Multiplanar CT image reconstructions of the cervical spine were also generated. COMPARISON:  11/30/2019 FINDINGS: CT HEAD FINDINGS Brain: No evidence of acute infarction, hemorrhage, hydrocephalus, extra-axial collection or mass lesion/mass effect. 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 Vascular: No hyperdense vessel or unexpected calcification. Skull: Normal. Negative for fracture or focal lesion. Sinuses/Orbits: No evidence of injury CT CERVICAL SPINE FINDINGS Alignment: No traumatic malalignment Skull base and vertebrae: No acute fracture. No primary bone lesion or focal pathologic process. Soft tissues and spinal canal: No prevertebral fluid or swelling. No visible canal hematoma. Disc levels:  Ordinary degenerative changes. Upper chest: No evidence of injury IMPRESSION: No evidence of acute intracranial or cervical spine injury. Electronically Signed   By: Monte Fantasia M.D.   On: 11/21/2020 07:24    ____________________________________________   PROCEDURES  Procedure(s) performed (including Critical Care):  Procedures   ____________________________________________   INITIAL IMPRESSION / ASSESSMENT AND PLAN / ED COURSE  As part of my medical decision making, I reviewed the following data within the [place] notes reviewed and incorporated, Labs reviewed , Old chart reviewed, CT is  reviewed, and Notes from prior ED visits         Patient here with unwitnessed fall.  She is not sure why she fell.  Is complaining of left hip pain and is unable to range his hip without significant pain.  Concern for possible fracture.  It is unclear if she hit her head but she denies loss of consciousness.  She is not on antiplatelets or anticoagulants.  Will provide with pain and nausea medicine.  Will obtain CT head and cervical spine as well as x-rays of the left lower extremity.  She previously has had a right hip replacement after a fall and fracture in August 2021 by Dr. Roland Rack.  Will obtain labs, urine, EKG to ensure no signs of anemia, electrolyte derangement, hypoglycemia, ACS, UTI or other metabolic/organic pathology that could have led to her fall today.  ED PROGRESS  Labs show leukocytosis of 15,000 which may be reactive.  She is afebrile here.  Electrolytes within normal limits.  Normal glucose.  CT head and cervical spine showed no traumatic injury.  X-rays pending.  Signed out the oncoming ED physician.  I reviewed all nursing notes and pertinent previous records as available.  I have reviewed and interpreted any EKGs, lab and urine results, imaging (as available).    ____________________________________________   FINAL CLINICAL IMPRESSION(S) / ED DIAGNOSES  Final diagnoses:  Fall, initial encounter  Left hip pain     ED Discharge Orders     None       *Please note:  Gloria [name] was evaluated in Emergency Department on 11/21/2020 for the symptoms described in the history of present illness. She was evaluated in the context of the global COVID-19 pandemic, which necessitated consideration that the patient might be at risk for infection with the SARS-CoV-2 virus that causes COVID-19. Institutional protocols and algorithms that pertain to the evaluation of patients at risk for COVID-19 are in a state of rapid change based on information released by regulatory  bodies including the CDC and federal and state organizations. These policies and algorithms were followed during the patient's care in the ED.  Some ED evaluations and interventions may be delayed as a result of limited staffing during and the pandemic.*   Note:  This document was prepared using Dragon voice recognition software and may include unintentional dictation errors.    Miroslava Santellan, Delice Bison, DO 11/21/20 [phone]

## 2020-11-21 NOTE — ED Notes (Signed)
Daughter and pt requesting all daily medications, MD aware.

## 2020-11-21 NOTE — TOC Initial Note (Signed)
Transition of Care [facility]) - Initial/Assessment Note    Patient Details  Name: Gloria [name] MRN: [id number] Date of Birth: Jul 13, 1925  [facility] CM/SW Contact:    Ova Freshwater Phone Number: [phone] 11/21/2020, 1:06 PM  Clinical Narrative:                  Patient presents to [facility] due to unwitnessed fall.  Patient is from [facility] independent living and has bed offer for [facility] SNF facility pending PT evaluation and insurance authorization. Patient is independent with ADLs.  Main contact is Ellison Hughs (Daughter) [phone] [facility] Phone).    Barriers to Discharge: No Barriers Identified, SNF Pending bed offer, ED SNF auth   Patient Goals and CMS Choice        Expected Discharge Plan and Services                                                Prior Living Arrangements/Services                       Activities of Daily Living      Permission Sought/Granted                  Emotional Assessment              Admission diagnosis:  EMS Fall/Hip Pain Patient Active Problem List   Diagnosis Date Noted   Migraines 02/05/2020   Recurrent major depressive disorder, in full remission ([place]) 12/31/2019   Closed displaced midcervical fracture of right femur ([place]) 12/03/2019   Status post hip hemiarthroplasty 12/03/2019   Closed right hip fracture ([place]) 11/30/2019   Hip fracture ([place]) 11/30/2019   History of fracture of right hip 11/30/2019   Acute pain of right shoulder    Fall    Immunosuppression ([place]) 05/08/2019   Intramural hematoma of thoracic aorta ([place]) 04/08/2019   Thoracic ascending aortic aneurysm ([place]) 04/07/2019   Anemia 02/14/2018   Peripheral vascular disease ([place]) 09/14/2017   Aneurysm of ascending aorta ([place]) 02/14/2015   Essential hypertension 01/21/2014   Osteopenia 07/19/2013   Hypothyroidism 02/01/2007   GERD 02/01/2007   Rheumatoid arthritis ([place]) 02/01/2007   Disorder of bone  and cartilage 02/01/2007   COLONIC POLYPS, HX OF 02/01/2007   DISEASE, CELIAC 04/05/2002   Polymyalgia rheumatica ([place]) 04/05/2001   PCP:  Adin Hector, MD Pharmacy:   [place], [place] - [address] [place] [address] Phone: [phone] Fax: [phone]     Social Determinants of Health (SDOH) Interventions    Readmission Risk Interventions No flowsheet data found.

## 2020-11-21 NOTE — ED Notes (Signed)
Pt given lemon glycerine swabs due to dry mouth and pt currently having a NPO order at this time.   Daughter at bedside.

## 2020-11-21 NOTE — ED Provider Notes (Signed)
I discussed plan of care with Maurice Small, social work, who indicates that patient should be okay to go back to [facility] at increased level of care tomorrow.   Vladimir Crofts, MD 11/21/20 [phone]

## 2020-11-21 NOTE — ED Notes (Signed)
Meds crushed in applesauce. Warm blanket provided. Purewick canister emptied.

## 2020-11-21 NOTE — ED Notes (Signed)
Pt at CT

## 2020-11-21 NOTE — ED Notes (Signed)
Pt placed on 3L/min via [place] after fentanyl admin due to pt's shallow RR and drop in 02 sat.   Warm blanket given, daughter updated on pt status at this time.

## 2020-11-21 NOTE — NC FL2 (Signed)
[place] LEVEL OF CARE SCREENING TOOL     IDENTIFICATION  Patient Name: Gloria [name] Birthdate: November 16, 1925 Sex: female Admission Date (Current Location): 11/21/2020  [facility] and [place] Number:  Engineering geologist and Address:  [facility], [address], [place], [place] [address]      Provider Number: [id number]  Attending Physician Name and Address:  Lavonia Drafts, MD  Relative Name and Phone Number:  Ellison Hughs (Daughter)   [phone] [facility] Phone)    Current Level of Care: Hospital Recommended Level of Care: [place] Prior Approval Number:    Date Approved/Denied:   PASRR Number: [id number]  Discharge Plan: SNF    Current Diagnoses: Patient Active Problem List   Diagnosis Date Noted   Migraines 02/05/2020   Recurrent major depressive disorder, in full remission ([place]) 12/31/2019   Closed displaced midcervical fracture of right femur ([place]) 12/03/2019   Status post hip hemiarthroplasty 12/03/2019   Closed right hip fracture ([place]) 11/30/2019   Hip fracture ([place]) 11/30/2019   History of fracture of right hip 11/30/2019   Acute pain of right shoulder    Fall    Immunosuppression ([place]) 05/08/2019   Intramural hematoma of thoracic aorta ([place]) 04/08/2019   Thoracic ascending aortic aneurysm ([place]) 04/07/2019   Anemia 02/14/2018   Peripheral vascular disease ([place]) 09/14/2017   Aneurysm of ascending aorta ([place]) 02/14/2015   Essential hypertension 01/21/2014   Osteopenia 07/19/2013   Hypothyroidism 02/01/2007   GERD 02/01/2007   Rheumatoid arthritis ([place]) 02/01/2007   Disorder of bone and cartilage 02/01/2007   COLONIC POLYPS, HX OF 02/01/2007   DISEASE, CELIAC 04/05/2002   Polymyalgia rheumatica ([place]) 04/05/2001    Orientation RESPIRATION BLADDER Height & Weight     Self, Time, Situation, Place  Normal Continent Weight:   Height:     BEHAVIORAL SYMPTOMS/MOOD NEUROLOGICAL BOWEL NUTRITION  STATUS      Continent Diet  AMBULATORY STATUS COMMUNICATION OF NEEDS Skin   Limited Assist Verbally Normal                       Personal Care Assistance Level of Assistance  Bathing, Dressing, Total care, Feeding Bathing Assistance: Limited assistance Feeding assistance: Independent Dressing Assistance: Limited assistance Total Care Assistance: Limited assistance   Functional Limitations Info  Sight, Speech, Hearing Sight Info: Adequate Hearing Info: Adequate Speech Info: Adequate    SPECIAL CARE FACTORS FREQUENCY         PT 5X per week OT 5X per week              Contractures Contractures Info: Not present    Additional Factors Info          Moderna COVID-19 Vaccine 05/21/2019 , 04/23/2019           Current Medications (11/21/2020):  This is the current hospital active medication list Current Facility-Administered Medications  Medication Dose Route Frequency Provider Last Rate Last Admin   0.9 %  sodium chloride infusion   Intravenous Continuous Ward, Kristen N, DO 75 mL/hr at 11/21/20 0836 New Bag at 11/21/20 0836   Current Outpatient Medications  Medication Sig Dispense Refill   acetaminophen (TYLENOL) 325 MG tablet Take 2 tablets (650 mg total) by mouth every 4 (four) hours as needed for moderate pain.     amLODipine (NORVASC) 5 MG tablet Take 1 tablet (5 mg total) by mouth daily. 90 tablet 3   aspirin EC 81 MG EC tablet Take 1  tablet (81 mg total) by mouth daily at 6 (six) AM. 30 tablet 0   atorvastatin (LIPITOR) 20 MG tablet Take 1 tablet (20 mg total) by mouth daily at 6 PM. 30 tablet 0   carvedilol (COREG) 6.25 MG tablet Take 1 tablet (6.25 mg total) by mouth 2 (two) times daily with a meal. 60 tablet 0   folic acid (FOLVITE) 1 MG tablet TAKE 1 TABLET EVERY DAY     levothyroxine (SYNTHROID, LEVOTHROID) 75 MCG tablet Take 75 mcg by mouth daily.   11   methotrexate (RHEUMATREX) 2.5 MG tablet TAKE 6 TABLETS (15 MG TOTAL) BY MOUTH EVERY 7 (SEVEN) DAYS  FOR 168 DAYS     mirabegron ER (MYRBETRIQ) 50 MG TB24 tablet Take 1 tablet (50 mg total) by mouth daily. 30 tablet 11   Multiple Vitamin (MULTIVITAMIN WITH MINERALS) TABS tablet Take 1 tablet by mouth daily.     predniSONE (DELTASONE) 5 MG tablet Take 2.5 mg by mouth daily.     QUEtiapine (SEROQUEL) 25 MG tablet Take 25 mg by mouth at bedtime.     traZODone (DESYREL) 50 MG tablet Take 50 mg by mouth at bedtime.       Discharge Medications: Please see discharge summary for a list of discharge medications.  Relevant Imaging Results:  Relevant Lab Results:   Additional Information SS# [id number]  Adelene Amas, LCSWA

## 2020-11-21 NOTE — ED Notes (Signed)
[id number]

## 2020-11-21 NOTE — TOC Progression Note (Addendum)
Transition of Care [facility]) - Progression Note    Patient Details  Name: Gloria [name] MRN: [id number] Date of Birth: Mar 12, 1926  [facility] of [facility]) CM/SW Dasher, [place] Phone Number: [phone] 11/21/2020, 4:50 PM  Clinical Narrative:     CSW initiated insurance authorization for SNF placement at [facility]. [place] ID [id number], pending authorization.    Barriers to Discharge: No Barriers Identified, SNF Pending bed offer, ED SNF auth  Expected Discharge Plan and Services                                                 Social Determinants of Health (SDOH) Interventions    Readmission Risk Interventions No flowsheet data found.

## 2020-11-21 NOTE — ED Provider Notes (Signed)
X-ray negative for hip fracture, sent for CT of the pelvis given continued significant left hip pain, consistent with pelvic fracture  Daughter notes that [facility] should be able to accommodate the patient in skilled nursing facility for PT OT, will consult TOC PT   Lavonia Drafts, MD 11/21/20 1314

## 2020-11-22 MED ORDER — HYDROCODONE-ACETAMINOPHEN 5-325 MG PO TABS
1.0000 | ORAL_TABLET | Freq: Four times a day (QID) | ORAL | Status: DC | PRN
  Administered 2020-11-22 (×2): 2 via ORAL
  Filled 2020-11-22 (×2): qty 2

## 2020-11-22 NOTE — ED Notes (Signed)
Report called to Bussy at

## 2020-11-22 NOTE — ED Notes (Signed)
Report called to bussy at [facility] at this time

## 2020-11-22 NOTE — ED Provider Notes (Signed)
-----------------------------------------   6:15 AM on 11/22/2020 -----------------------------------------   Blood pressure 121/66, pulse 69, temperature 98.6 F (37 C), temperature source Oral, resp. rate 18, SpO2 98 %.  The patient is calm and cooperative at this time.  There have been no acute events since the last update.  Awaiting disposition plan from Social Work team.  Hopefully patient will be able to go back to [facility] today at an increased level of care.   Paulette Blanch, MD 11/22/20 [phone]

## 2020-11-22 NOTE — Discharge Instructions (Addendum)
Discharged to SNF. These are non op weight bear as tolerated.   IMPRESSION:  1. Acute nondisplaced fractures of the left superior and inferior  pubic rami with small volume extraperitoneal hemorrhage in the  anterior pelvis.  2. Small acute nondisplaced fracture of the left anterior sacral  ala.  3. Aortic Atherosclerosis (ICD10-I70.0).

## 2020-11-22 NOTE — ED Notes (Signed)
Pt provided pericare and pad change.

## 2020-11-22 NOTE — ED Provider Notes (Signed)
9:34 AM Assumed care for off going team.   Blood pressure (!) 142/61, pulse 67, temperature 98.6 F (37 C), temperature source Oral, resp. rate 13, SpO2 98 %.  See their HPI for full report but in brief I was told that patient would be able to go to [facility] with additional care.  We will place discharge order       Vanessa [place], MD 11/22/20 [phone]

## 2020-11-22 NOTE — ED Notes (Signed)
Called [facility] for transport to [facility]  [phone]

## 2020-11-22 NOTE — TOC Transition Note (Signed)
Transition of Care [facility]) - CM/SW Discharge Note   Patient Details  Name: Gloria [name] MRN: [id number] Date of Birth: 1925/06/13  [facility] CM/SW Contact:  Magnus Ivan, LCSW Phone Number: 11/22/2020, 10:56 AM   Clinical Narrative:   Patient to discharge to [facility] today, Room 106. Confirmed with Seth Bake at [facility]. Sent AVS to Seth Bake via encrypted email per her request. Asked RN to call report. Updated daughter [name] via phone. ED Secretary Otho Perl, RN Lilia Pro, and Dr Jari Pigg made aware of discharge. Luann to arrange EMS.    No other needs identified prior to discharge.      Final next level of care: Skilled Nursing Facility Barriers to Discharge: Barriers Resolved   Patient Goals and CMS Choice Patient states their goals for this hospitalization and ongoing recovery are:: SNF CMS Medicare.gov Compare Post Acute Care list provided to:: Patient Represenative (must comment) Choice offered to / list presented to : Adult Children  Discharge Placement              Patient chooses bed at: [facility] Patient to be transferred to facility by: [facility] Name of family member notified: [name] - daughter Patient and family notified of of transfer: 11/22/20  Discharge Plan and Services                                     Social Determinants of Health (SDOH) Interventions     Readmission Risk Interventions No flowsheet data found.

## 2020-11-22 NOTE — TOC Progression Note (Signed)
Transition of Care [facility]) - Progression Note    Patient Details  Name: Gloria [name] MRN: [id number] Date of Birth: March 09, 1926  Transition of Care [facility]) CM/SW [place], LCSW Phone Number: 11/22/2020, 9:34 AM  Clinical Narrative:   Patient has insurance auth for SNF. Reached out to [facility] who reported they can take patient in a few hours, need DC summary. Updated care team.       Barriers to Discharge: No Barriers Identified, SNF Pending bed offer, ED SNF auth  Expected Discharge Plan and Services                                                 Social Determinants of Health (SDOH) Interventions    Readmission Risk Interventions No flowsheet data found.

## 2020-11-24 DIAGNOSIS — E039 Hypothyroidism, unspecified: Secondary | ICD-10-CM

## 2020-11-24 DIAGNOSIS — K219 Gastro-esophageal reflux disease without esophagitis: Secondary | ICD-10-CM | POA: Diagnosis not present

## 2020-11-24 DIAGNOSIS — S3289XA Fracture of other parts of pelvis, initial encounter for closed fracture: Secondary | ICD-10-CM

## 2020-11-24 DIAGNOSIS — M810 Age-related osteoporosis without current pathological fracture: Secondary | ICD-10-CM | POA: Diagnosis not present

## 2020-11-24 DIAGNOSIS — I10 Essential (primary) hypertension: Secondary | ICD-10-CM | POA: Diagnosis not present

## 2020-11-24 DIAGNOSIS — M069 Rheumatoid arthritis, unspecified: Secondary | ICD-10-CM | POA: Diagnosis not present

## 2020-12-10 ENCOUNTER — Other Ambulatory Visit: Payer: Self-pay | Admitting: Orthopedic Surgery

## 2020-12-16 ENCOUNTER — Other Ambulatory Visit: Payer: Self-pay

## 2020-12-16 ENCOUNTER — Emergency Department
Admission: RE | Admit: 2020-12-16 | Discharge: 2020-12-16 | Disposition: A | Discharge status: Home / Self Care | Payer: Medicare PPO | Source: Ambulatory Visit | Attending: Orthopedic Surgery | Admitting: Orthopedic Surgery

## 2020-12-16 ENCOUNTER — Observation Stay
Admission: EM | Admit: 2020-12-16 | Discharge: 2020-12-18 | Disposition: A | Discharge status: Home / Self Care | Payer: Medicare PPO | Attending: Obstetrics and Gynecology | Admitting: Obstetrics and Gynecology

## 2020-12-16 ENCOUNTER — Emergency Department: Payer: Medicare PPO

## 2020-12-16 DIAGNOSIS — M069 Rheumatoid arthritis, unspecified: Secondary | ICD-10-CM | POA: Diagnosis present

## 2020-12-16 DIAGNOSIS — E039 Hypothyroidism, unspecified: Secondary | ICD-10-CM | POA: Diagnosis present

## 2020-12-16 DIAGNOSIS — S329XXA Fracture of unspecified parts of lumbosacral spine and pelvis, initial encounter for closed fracture: Secondary | ICD-10-CM | POA: Diagnosis present

## 2020-12-16 DIAGNOSIS — I10 Essential (primary) hypertension: Secondary | ICD-10-CM | POA: Diagnosis present

## 2020-12-16 DIAGNOSIS — Z79899 Other long term (current) drug therapy: Secondary | ICD-10-CM | POA: Diagnosis not present

## 2020-12-16 DIAGNOSIS — M353 Polymyalgia rheumatica: Secondary | ICD-10-CM

## 2020-12-16 DIAGNOSIS — S3219XA Other fracture of sacrum, initial encounter for closed fracture: Secondary | ICD-10-CM | POA: Diagnosis not present

## 2020-12-16 DIAGNOSIS — Z20822 Contact with and (suspected) exposure to covid-19: Secondary | ICD-10-CM | POA: Diagnosis not present

## 2020-12-16 DIAGNOSIS — S32810A Multiple fractures of pelvis with stable disruption of pelvic ring, initial encounter for closed fracture: Secondary | ICD-10-CM | POA: Diagnosis not present

## 2020-12-16 DIAGNOSIS — F32A Depression, unspecified: Secondary | ICD-10-CM | POA: Diagnosis present

## 2020-12-16 DIAGNOSIS — K219 Gastro-esophageal reflux disease without esophagitis: Secondary | ICD-10-CM | POA: Diagnosis not present

## 2020-12-16 DIAGNOSIS — E785 Hyperlipidemia, unspecified: Secondary | ICD-10-CM | POA: Diagnosis present

## 2020-12-16 DIAGNOSIS — W19XXXA Unspecified fall, initial encounter: Secondary | ICD-10-CM | POA: Diagnosis not present

## 2020-12-16 DIAGNOSIS — Z96641 Presence of right artificial hip joint: Secondary | ICD-10-CM | POA: Diagnosis not present

## 2020-12-16 DIAGNOSIS — S3993XA Unspecified injury of pelvis, initial encounter: Secondary | ICD-10-CM | POA: Diagnosis present

## 2020-12-16 DIAGNOSIS — S32810D Multiple fractures of pelvis with stable disruption of pelvic ring, subsequent encounter for fracture with routine healing: Secondary | ICD-10-CM | POA: Diagnosis not present

## 2020-12-16 DIAGNOSIS — Z7982 Long term (current) use of aspirin: Secondary | ICD-10-CM | POA: Insufficient documentation

## 2020-12-16 DIAGNOSIS — F339 Major depressive disorder, recurrent, unspecified: Secondary | ICD-10-CM | POA: Diagnosis present

## 2020-12-16 DIAGNOSIS — Z419 Encounter for procedure for purposes other than remedying health state, unspecified: Secondary | ICD-10-CM

## 2020-12-16 HISTORY — DX: Fracture of unspecified parts of lumbosacral spine and pelvis, initial encounter for closed fracture: S32.9XXA

## 2020-12-16 LAB — BASIC METABOLIC PANEL
Anion gap: 11 (ref 5–15)
BUN: 10 mg/dL (ref 8–23)
CO2: 24 mmol/L (ref 22–32)
Calcium: 8.7 mg/dL — ABNORMAL LOW (ref 8.9–10.3)
Chloride: 102 mmol/L (ref 98–111)
Creatinine, Ser: 0.43 mg/dL — ABNORMAL LOW (ref 0.44–1.00)
GFR, Estimated: >60 mL/min (ref >60)
Glucose, Bld: 101 mg/dL — ABNORMAL HIGH (ref 70–99)
Potassium: 3.7 mmol/L (ref 3.5–5.1)
Sodium: 137 mmol/L (ref 135–145)

## 2020-12-16 LAB — CBC WITH DIFFERENTIAL/PLATELET
Abs Immature Granulocytes: 0.06 10*3/uL (ref 0.00–0.07)
Basophils Absolute: 0.1 10*3/uL (ref 0.0–0.1)
Basophils Relative: 1 %
Eosinophils Absolute: 0.3 10*3/uL (ref 0.0–0.5)
Eosinophils Relative: 3 %
HCT: 37.8 % (ref 36.0–46.0)
Hemoglobin: 12.6 g/dL (ref 12.0–15.0)
Immature Granulocytes: 1 %
Lymphocytes Relative: 18 %
Lymphs Abs: 1.8 10*3/uL (ref 0.7–4.0)
MCH: 31.9 pg (ref 26.0–34.0)
MCHC: 33.3 g/dL (ref 30.0–36.0)
MCV: 95.7 fL (ref 80.0–100.0)
Monocytes Absolute: 0.9 10*3/uL (ref 0.1–1.0)
Monocytes Relative: 9 %
Neutro Abs: 6.9 10*3/uL (ref 1.7–7.7)
Neutrophils Relative %: 68 %
Platelets: 210 10*3/uL (ref 150–400)
RBC: 3.95 MIL/uL (ref 3.87–5.11)
RDW: 16.4 % — ABNORMAL HIGH (ref 11.5–15.5)
Smear Review: UNDETERMINED
WBC: 10.1 10*3/uL (ref 4.0–10.5)
nRBC: 0 % (ref 0.0–0.2)

## 2020-12-16 LAB — PROTIME-INR
INR: 1 (ref 0.8–1.2)
Prothrombin Time: 13.4 seconds (ref 11.4–15.2)

## 2020-12-16 LAB — TYPE AND SCREEN
ABO/RH(D): O POS
Antibody Screen: NEGATIVE

## 2020-12-16 LAB — APTT: aPTT: 32 seconds (ref 24–36)

## 2020-12-16 MED ORDER — HYDROCORTISONE SOD SUC (PF) 100 MG IJ SOLR
50.0000 mg | Freq: Once | INTRAMUSCULAR | Status: AC
  Administered 2020-12-16: 50 mg via INTRAVENOUS
  Filled 2020-12-16 (×2): qty 1

## 2020-12-16 MED ORDER — HYDRALAZINE HCL 20 MG/ML IJ SOLN: 5.0000 mg | INTRAMUSCULAR | Status: DC | PRN

## 2020-12-16 MED ORDER — MORPHINE SULFATE (PF) 2 MG/ML IV SOLN: 0.5000 mg | INTRAVENOUS | Status: DC | PRN

## 2020-12-16 MED ORDER — ONDANSETRON HCL 4 MG/2ML IJ SOLN
4.0000 mg | Freq: Once | INTRAMUSCULAR | Status: AC
  Administered 2020-12-16: 4 mg via INTRAVENOUS
  Filled 2020-12-16: qty 2

## 2020-12-16 MED ORDER — METHOTREXATE 2.5 MG PO TABS: 15.0000 mg | ORAL_TABLET | ORAL | Status: DC

## 2020-12-16 MED ORDER — DIAZEPAM 2 MG PO TABS
2.0000 mg | ORAL_TABLET | Freq: Once | ORAL | Status: AC
  Administered 2020-12-16: 2 mg via ORAL
  Filled 2020-12-16: qty 1

## 2020-12-16 MED ORDER — AMLODIPINE BESYLATE 5 MG PO TABS
5.0000 mg | ORAL_TABLET | Freq: Every day | ORAL | Status: DC
  Administered 2020-12-17 – 2020-12-18 (×2): 5 mg via ORAL
  Filled 2020-12-16 (×3): qty 1

## 2020-12-16 MED ORDER — ACETAMINOPHEN 325 MG PO TABS
650.0000 mg | ORAL_TABLET | Freq: Four times a day (QID) | ORAL | Status: DC | PRN
  Administered 2020-12-17: 650 mg via ORAL
  Filled 2020-12-16: qty 2

## 2020-12-16 MED ORDER — FENTANYL CITRATE PF 50 MCG/ML IJ SOSY
25.0000 ug | PREFILLED_SYRINGE | Freq: Once | INTRAMUSCULAR | Status: AC
Start: 2020-12-16 — End: 2020-12-16
  Administered 2020-12-16: 25 ug via INTRAVENOUS
  Filled 2020-12-16: qty 1

## 2020-12-16 MED ORDER — PANTOPRAZOLE SODIUM 20 MG PO TBEC
20.0000 mg | DELAYED_RELEASE_TABLET | Freq: Every day | ORAL | Status: DC | PRN
  Filled 2020-12-16: qty 1

## 2020-12-16 MED ORDER — CARVEDILOL 3.125 MG PO TABS
6.2500 mg | ORAL_TABLET | Freq: Every day | ORAL | Status: DC
  Administered 2020-12-16 – 2020-12-17 (×2): 6.25 mg via ORAL
  Filled 2020-12-16 (×2): qty 2

## 2020-12-16 MED ORDER — SENNOSIDES-DOCUSATE SODIUM 8.6-50 MG PO TABS: 1.0000 | ORAL_TABLET | Freq: Every evening | ORAL | Status: DC | PRN

## 2020-12-16 MED ORDER — ADULT MULTIVITAMIN W/MINERALS CH
1.0000 | ORAL_TABLET | Freq: Every day | ORAL | Status: DC
  Administered 2020-12-16 – 2020-12-18 (×3): 1 via ORAL
  Filled 2020-12-16 (×3): qty 1

## 2020-12-16 MED ORDER — CEFAZOLIN SODIUM-DEXTROSE 1-4 GM/50ML-% IV SOLN
1.0000 g | Freq: Once | INTRAVENOUS | Status: AC
  Administered 2020-12-17: 1 g via INTRAVENOUS
  Filled 2020-12-16: qty 50

## 2020-12-16 MED ORDER — OXYCODONE-ACETAMINOPHEN 5-325 MG PO TABS
1.0000 | ORAL_TABLET | ORAL | Status: DC | PRN
  Administered 2020-12-16 – 2020-12-18 (×2): 1 via ORAL
  Filled 2020-12-16 (×2): qty 1

## 2020-12-16 MED ORDER — PREDNISONE 5 MG PO TABS
7.5000 mg | ORAL_TABLET | Freq: Every day | ORAL | Status: DC
  Filled 2020-12-16: qty 1

## 2020-12-16 MED ORDER — FOLIC ACID 1 MG PO TABS
1.0000 mg | ORAL_TABLET | Freq: Every day | ORAL | Status: DC
  Administered 2020-12-17 – 2020-12-18 (×2): 1 mg via ORAL
  Filled 2020-12-16 (×2): qty 1

## 2020-12-16 MED ORDER — LEVOTHYROXINE SODIUM 50 MCG PO TABS
75.0000 ug | ORAL_TABLET | Freq: Every day | ORAL | Status: DC
  Administered 2020-12-18: 75 ug via ORAL
  Filled 2020-12-16 (×2): qty 1

## 2020-12-16 MED ORDER — METHOCARBAMOL 500 MG PO TABS
500.0000 mg | ORAL_TABLET | Freq: Three times a day (TID) | ORAL | Status: DC | PRN
  Filled 2020-12-16: qty 1

## 2020-12-16 MED ORDER — ONDANSETRON HCL 4 MG/2ML IJ SOLN: 4.0000 mg | Freq: Three times a day (TID) | INTRAMUSCULAR | Status: DC | PRN

## 2020-12-16 NOTE — H&P (Signed)
History and Physical    Gloria [name] [id number] DOB: Aug 24, 1925 DOA: 12/16/2020  Referring MD/NP/PA:   PCP: Adin Hector, MD   Patient coming from:  The patient is coming from home.   Chief Complaint: severe pelvic pain   HPI: Bridey Eavenson is a 87 y.o. female with medical history significant of hypertension, hyperlipidemia, GERD, hypothyroidism, depression, rheumatoid arthritis, PMR, aneurysm of ascending aorta (s/p of stent placement), celiac disease, PVD, anemia, who presents with severe pelvic pain.  Patient had a fall on 8/19. CT scan showed acute nondisplaced fractures of the left superior and inferior pubic rami and small acute nondisplaced fracture of the left anterior sacral ala. Dr. [name] has been trying to get insurance authorization for surgery. Pt states that her pelvic pain is so severe that she cannot tolerate anymore.  She cannot ambulate. The pain is mainly located in the right side of her pelvis.  No leg numbness or tingling.  She also complains of intermittent muscle spasm.  Patient does not have chest pain, cough, shortness of breath.  No fever or chills.  No nausea vomiting, diarrhea or abdominal pain.  No symptoms of UTI. She is in such pain today her daughter brought her to the emergency room.  ED Course: pt was found to have WBC 10.1, pending COVID-19 PCR, electrolytes renal function okay, temperature normal, blood pressure 142/70, heart rate 78, RR 18, oxygen saturation 99% on room air.  Patient is admitted to [facility] bed as inpatient  CT scan of pelvis showed: Increase conspicuity of the left sacral ala fractures and the left superior/inferior pubic rami fractures, likely related to bone resorption. There is slight increased displacement of the left superior pubic ramus fracture.  Review of Systems:   General: no fevers, chills, no body weight gain, has fatigue HEENT: no blurry vision, hearing changes or sore throat Respiratory: no dyspnea,  coughing, wheezing CV: no chest pain, no palpitations GI: no nausea, vomiting, abdominal pain, diarrhea, constipation GU: no dysuria, burning on urination, increased urinary frequency, hematuria  Ext: no leg edema Neuro: no unilateral weakness, numbness, or tingling, no vision change or hearing loss Skin: no rash, no skin tear. MSK: Has pelvic pain, mainly on the right side of her pelvis.  Has muscle spasm Heme: No easy bruising.  Travel history: No recent long distant travel.  Allergy:  Allergies  Allergen Reactions   Amoxicillin Other (See Comments)    intolerant   Codeine Nausea Only   Ibandronate Sodium    Ibandronate Sodium  [Ibandronic Acid] Other (See Comments)    Gi upset   Risedronate Other (See Comments)    Gi upset   Risedronate Sodium    Sulfa Antibiotics Hives    Past Medical History:  Diagnosis Date   Arthritis    Collagen vascular disease ([place])    RA   Heartburn    Hypertension     Past Surgical History:  Procedure Laterality Date   ABDOMINAL HYSTERECTOMY     BREAST CYST EXCISION     HIP ARTHROPLASTY Right 11/30/2019   Procedure: ARTHROPLASTY BIPOLAR HIP (HEMIARTHROPLASTY);  Surgeon: Corky Mull, MD;  Location: [facility] ORS;  Service: Orthopedics;  Laterality: Right;   THORACIC AORTIC ENDOVASCULAR STENT GRAFT N/A 04/11/2019   Procedure: THORACIC AORTIC ENDOVASCULAR STENT GRAFT insertion;  Surgeon: Serafina Mitchell, MD;  Location: [facility];  Service: Vascular;  Laterality: N/A;   ULTRASOUND GUIDANCE FOR VASCULAR ACCESS Right 04/11/2019   Procedure: Ultrasound Guidance For Vascular  Access, right femoral artery;  Surgeon: Serafina Mitchell, MD;  Location: [facility] OR;  Service: Vascular;  Laterality: Right;    Social History:  reports that she has never smoked. She has never used smokeless tobacco. She reports that she does not drink alcohol and does not use drugs.  Family History:  Family History  Problem Relation Age of Onset   Kidney disease Neg Hx    Bladder Cancer  Neg Hx      Prior to Admission medications   Medication Sig Start Date End Date Taking? Authorizing Provider  acetaminophen (TYLENOL) 325 MG tablet Take 2 tablets (650 mg total) by mouth every 4 (four) hours as needed for moderate pain. 12/03/19   Arrien, Jimmy Picket, MD  amLODipine (NORVASC) 5 MG tablet Take 1 tablet (5 mg total) by mouth daily. 05/13/20 05/08/21  Carrie Mew, MD  aspirin EC 81 MG EC tablet Take 1 tablet (81 mg total) by mouth daily at 6 (six) AM. 04/13/19   Aslam, Loralyn [place], MD  atorvastatin (LIPITOR) 20 MG tablet Take 1 tablet (20 mg total) by mouth daily at 6 PM. 04/12/19 11/21/20  Harvie Heck, MD  carvedilol (COREG) 6.25 MG tablet Take 1 tablet (6.25 mg total) by mouth 2 (two) times daily with a meal. Patient taking differently: Take 6.25 mg by mouth at bedtime. 04/12/19 12/16/20  Harvie Heck, MD  folic acid (FOLVITE) 1 MG tablet TAKE 1 TABLET EVERY DAY 03/28/14   [provider]  levothyroxine (SYNTHROID, LEVOTHROID) 75 MCG tablet Take 75 mcg by mouth daily.  02/01/14   [provider]  methotrexate (RHEUMATREX) 2.5 MG tablet TAKE 6 TABLETS (15 MG TOTAL) BY MOUTH EVERY 7 (SEVEN) DAYS FOR 168 DAYS 02/23/19   [provider]  mirabegron ER (MYRBETRIQ) 50 MG TB24 tablet Take 1 tablet (50 mg total) by mouth daily. Patient not taking: Reported on 12/16/2020 09/29/20   Bjorn Loser, MD  Multiple Vitamin (MULTIVITAMIN WITH MINERALS) TABS tablet Take 1 tablet by mouth daily.    [provider]  predniSONE (DELTASONE) 5 MG tablet Take 2.5 mg by mouth daily. 02/23/19   [provider]  QUEtiapine (SEROQUEL) 25 MG tablet Take 25 mg by mouth at bedtime. Patient not taking: Reported on 12/16/2020    [provider]  traZODone (DESYREL) 50 MG tablet Take 50 mg by mouth at bedtime. Patient not taking: Reported on 12/16/2020 12/31/19   [provider]    Physical Exam: Vitals:   12/16/20 0953 12/16/20 1458 12/16/20 1530  12/16/20 1730  BP: (!) 146/66 (!) 142/70 (!) 163/64 (!) 153/67  Pulse: 78 74 66 66  Resp: '18 18 18 16  '$ Temp: 98.5 F (36.9 C)  98.4 F (36.9 C)   TempSrc: Oral  Oral   SpO2: 99% 99% 98% 95%  Weight: 70.3 kg     Height: '5\' 7"'$  (1.702 m)      General: Not in acute distress HEENT:       Eyes: PERRL, EOMI, no scleral icterus.       ENT: No discharge from the ears and nose, no pharynx injection, no tonsillar enlargement.        Neck: No JVD, no bruit, no mass felt. Heme: No neck lymph node enlargement. Cardiac: S1/S2, RRR, No murmurs, No gallops or rubs. Respiratory: No rales, wheezing, rhonchi or rubs. GI: Soft, nondistended, nontender, no rebound pain, no organomegaly, BS present. GU: No hematuria Ext: No pitting leg edema bilaterally. 1+DP/PT pulse bilaterally. Musculoskeletal: Has tenderness in  right side pelvis.   Skin: No rashes.  Neuro: Alert, oriented X3, cranial nerves II-XII grossly intact, moves all extremities normally. Psych: Patient is not psychotic, no suicidal or hemocidal ideation.  Labs on Admission: I have personally reviewed following labs and imaging studies  CBC: Recent Labs  Lab 12/16/20 1512  WBC 10.1  NEUTROABS 6.9  HGB 12.6  HCT 37.8  MCV 95.7  PLT [id number]   Basic Metabolic Panel: Recent Labs  Lab 12/16/20 1512  NA 137  K 3.7  CL 102  CO2 24  GLUCOSE 101*  BUN 10  CREATININE 0.43*  CALCIUM 8.7*   GFR: Estimated Creatinine Clearance: 42.7 mL/min (A) (by C-G formula based on SCr of 0.43 mg/dL (L)). Liver Function Tests: No results for input(s): AST, ALT, ALKPHOS, BILITOT, PROT, ALBUMIN in the last 168 hours. No results for input(s): LIPASE, AMYLASE in the last 168 hours. No results for input(s): AMMONIA in the last 168 hours. Coagulation Profile: No results for input(s): INR, PROTIME in the last 168 hours. Cardiac Enzymes: No results for input(s): CKTOTAL, CKMB, CKMBINDEX, TROPONINI in the last 168 hours. BNP (last 3 results) No results  for input(s): PROBNP in the last 8760 hours. HbA1C: No results for input(s): HGBA1C in the last 72 hours. CBG: No results for input(s): GLUCAP in the last 168 hours. Lipid Profile: No results for input(s): CHOL, HDL, LDLCALC, TRIG, CHOLHDL, LDLDIRECT in the last 72 hours. Thyroid Function Tests: No results for input(s): TSH, T4TOTAL, FREET4, T3FREE, THYROIDAB in the last 72 hours. Anemia Panel: No results for input(s): VITAMINB12, FOLATE, FERRITIN, TIBC, IRON, RETICCTPCT in the last 72 hours. Urine analysis:    Component Value Date/Time   COLORURINE YELLOW (A) 11/21/2020 0836   APPEARANCEUR CLOUDY (A) 11/21/2020 0836   APPEARANCEUR Hazy (A) 08/29/2020 1113   LABSPEC 1.008 11/21/2020 0836   PHURINE 8.0 11/21/2020 0836   GLUCOSEU NEGATIVE 11/21/2020 0836   HGBUR NEGATIVE 11/21/2020 0836   BILIRUBINUR NEGATIVE 11/21/2020 0836   BILIRUBINUR Negative 07/28/2020 0902   KETONESUR NEGATIVE 11/21/2020 0836   PROTEINUR NEGATIVE 11/21/2020 0836   NITRITE NEGATIVE 11/21/2020 0836   LEUKOCYTESUR NEGATIVE 11/21/2020 0836   Sepsis Labs: '@LABRCNTIP'$ (procalcitonin:4,lacticidven:4) )No results found for this or any previous visit (from the past 240 hour(s)).   Radiological Exams on Admission: CT PELVIS WO CONTRAST  Result Date: 12/16/2020 CLINICAL DATA:  Pelvic fractures with increasing pain. EXAM: CT PELVIS WITHOUT CONTRAST TECHNIQUE: Multidetector CT imaging of the pelvis was performed following the standard protocol without intravenous contrast. COMPARISON:  11/21/2020 FINDINGS: Urinary Tract:  No abnormality visualized. Bowel: Sigmoid diverticulosis. No active diverticulitis. Pelvic small bowel decompressed. Vascular/Lymphatic: Aorta iliac atherosclerosis.  No adenopathy Reproductive:  Prior hysterectomy.  No adnexal masses. Other:  No free fluid or free air. Musculoskeletal: Progressive fractures noted through the left sacral ala with fractures more apparent. No displacement. Increase  conspicuity of the fracture may be related to bone resorption around the fracture. Fractures through the left superior and inferior pubic rami also better visualized, likely related to bone resorption. Some increased displacement of the superior pubic ramus fracture. Previously-seen extraperitoneal hematoma adjacent to the left superior pubic ramus has decreased since prior study. Prior right hip replacement. IMPRESSION: Increase conspicuity of the left sacral ala fractures and the left superior/inferior pubic rami fractures, likely related to bone resorption. There is slight increased displacement of the left superior pubic ramus fracture. Electronically Signed   By: Rolm Baptise M.D.   On: 12/16/2020 12:34  EKG: Not done in ED, will get one.   Assessment/Plan Principal Problem:   Closed pelvic fracture (HCC) Active Problems:   Hypothyroidism   GERD   Rheumatoid arthritis ([place])   Polymyalgia rheumatica (HCC)   Essential hypertension   HLD (hyperlipidemia)   Depression   Closed pelvic fracture ([place]): As evidenced by CT scan. Patient has severe pain and muscle spasm. No neurovascular compromise. Orthopedic surgeon, Dr. [name] was consulted, planning to do surgery tomorrow afternoon at about 3:00  - will admit to Med-surg bed - Pain control: morphine prn and percocet - When necessary Zofran for nausea - Robaxin for muscle spasm - type and cross - INR/PTT - PT/OT when able to (not ordered now)  Hypothyroidism -Synthroid  GERD -Protonix prn  Rheumatoid arthritis and Polymyalgia rheumatica (HCC) -Continue home methotrexate and prednisone -Increase prednisone dose from 2.5 to 7.5 mg daily -Give Solu-Cortef 50 mg as stress dose  Essential hypertension -IV hydralazine as needed -Amlodipine, Coreg  HLD (hyperlipidemia): pt is not taking lipitor currently -f/u with PCP  Depression: pt is not taking meds now -monitoring    Perioperative Cardiac Risk: pt has multiple  comorbidities as listed above, but no CAD, heart attack or CHF. Currently patient is active and parietally independent of ADLs and, IADLs. No recent acute cardiac issues, no chest pain or shortness breath.  2D echo on 04/09/2019 showed EF 60-65%. At this time point, no further work up is needed. Patient's GUPTA score perioperative myocardial infarction or cardaic arrest is 2/01 %.  I discussed the risk with patient and family, pt would like to proceed for surgery.   DVT ppx: SCD Code Status: DNR per pt which is confirmed by her daughter Family Communication: Yes, patient's daughter by phone Disposition Plan:  Anticipate discharge back to previous environment Consults called:  Dr. [name] of ortho Admission status and Level of care: Med-Surg:   as inpt    Status is: Inpatient  Not inpatient appropriate, will call UM team and downgrade to OBS.   Dispo: The patient is from: Home              Anticipated d/c is to:  to be determined              Patient currently is not medically stable to d/c.   Difficult to place patient No             Date of Service 12/16/2020      [facility]   If 7PM-7AM, please contact night-coverage [email or web address] 12/16/2020, 6:29 PM

## 2020-12-16 NOTE — Patient Instructions (Addendum)
Your procedure is scheduled on: 12/23/20 Report to [place]. To find out your arrival time please call [phone] between 1PM - 3PM on 12/22/20 .  Remember: Instructions that are not followed completely may result in serious medical risk, up to and including death, or upon the discretion of your surgeon and anesthesiologist your surgery may need to be rescheduled.     _X__ 1. Do not eat food after midnight the night before your procedure.                 No gum chewing or hard candies.   __X__2.  On the morning of surgery brush your teeth with toothpaste and water, you                 may rinse your mouth with mouthwash if you wish.  Do not swallow any              toothpaste of mouthwash.     _X__ 3.  No Alcohol for 24 hours before or after surgery.   _X__ 4.  Do Not Smoke or use e-cigarettes For 24 Hours Prior to Your Surgery.                 Do not use any chewable tobacco products for at least 6 hours prior to                 surgery.  ____  5.  Bring all medications with you on the day of surgery if instructed.   __X__  6.  Notify your doctor if there is any change in your medical condition      (cold, fever, infections).     Do not wear jewelry, make-up, hairpins, clips or nail polish. Do not wear lotions, powders, or perfumes.  Do not shave 48 hours prior to surgery. Men may shave face and neck. Do not bring valuables to the hospital.    [facility] is not responsible for any belongings or valuables.  Contacts, dentures/partials or body piercings may not be worn into surgery. Bring a case for your contacts, glasses or hearing aids, a denture cup will be supplied. Leave your suitcase in the car. After surgery it may be brought to your room. For patients admitted to the hospital, discharge time is determined by your treatment team.   Patients discharged the day of surgery will not be allowed to drive home.   Please  read over the following fact sheets that you were given:    __X__ Take these medicines the morning of surgery with A SIP OF WATER:    1. amLODipine (NORVASC) 5 MG tablet  2. levothyroxine (SYNTHROID, LEVOTHROID) 75 MCG tablet  3.   4.  5.  6.  ____ Fleet Enema (as directed)   ____ Use CHG Soap/SAGE wipes as directed  ____ Use inhalers on the day of surgery  ____ Stop metformin/Janumet/Farxiga 2 days prior to surgery    ____ Take 1/2 of usual insulin dose the night before surgery. No insulin the morning          of surgery.   ____ Stop Blood Thinners Coumadin/Plavix/Xarelto/Pleta/Pradaxa/Eliquis/Effient/Aspirin  on   Or contact your Surgeon, Cardiologist or Medical Doctor regarding  ability to stop your blood thinners  __X__ Stop Anti-inflammatories 7 days before surgery such as Advil, Ibuprofen, Motrin,  BC or Goodies Powder, Naprosyn, Naproxen, Aleve, Aspirin    __X__ Stop all herbal supplements, fish  oil or vitamin E until after surgery.    ____ Bring C-Pap to the hospital.

## 2020-12-16 NOTE — Pre-Procedure Instructions (Signed)
Per daughter patient  was taken to the emergency department for eval of sacral fracture from recent fall. PAT interview today for surgery r/t same, info provided by patient's daughter. Instructions provided and she verbalized understanding. Also will sign up for my chart to view them online

## 2020-12-16 NOTE — ED Provider Notes (Signed)
[facility] Emergency Department Provider Note ____________________________________________  Time seen: Approximately 11:33 AM  I have reviewed the triage vital signs and the nursing notes.   HISTORY  Chief Complaint Tailbone Pain    HPI Gloria [name] is a 87 y.o. female who presents to the emergency department for evaluation and treatment of increasing pain in the tailbone and left pelvic area.  She sustained a fall on August 19 and was evaluated here.  At that time, CT of the pelvis showed acute nondisplaced fractures of the left superior and inferior pubic rami with extraperitoneal hemorrhage in the anterior pelvis.  There was also a small acute nondisplaced fracture of the left anterior sacral alla.  Patient states that the pain has been progressively worsening while at home.  She had physical therapy on the hip yesterday and believes that this has caused acute increase.  No improvement with her oxycodone tablets.  Past Medical History:  Diagnosis Date   Arthritis    Collagen vascular disease ([place])    RA   Heartburn    Hypertension     Patient Active Problem List   Diagnosis Date Noted   Migraines 02/05/2020   Recurrent major depressive disorder, in full remission ([place]) 12/31/2019   Closed displaced midcervical fracture of right femur ([place]) 12/03/2019   Status post hip hemiarthroplasty 12/03/2019   Closed right hip fracture ([place]) 11/30/2019   Hip fracture ([place]) 11/30/2019   History of fracture of right hip 11/30/2019   Acute pain of right shoulder    Fall    Immunosuppression ([place]) 05/08/2019   Intramural hematoma of thoracic aorta ([place]) 04/08/2019   Thoracic ascending aortic aneurysm ([place]) 04/07/2019   Anemia 02/14/2018   Peripheral vascular disease ([place]) 09/14/2017   Aneurysm of ascending aorta ([place]) 02/14/2015   Essential hypertension 01/21/2014   Osteopenia 07/19/2013   Hypothyroidism 02/01/2007   GERD 02/01/2007   Rheumatoid  arthritis ([place]) 02/01/2007   Disorder of bone and cartilage 02/01/2007   COLONIC POLYPS, HX OF 02/01/2007   DISEASE, CELIAC 04/05/2002   Polymyalgia rheumatica ([place]) 04/05/2001    Past Surgical History:  Procedure Laterality Date   ABDOMINAL HYSTERECTOMY     BREAST CYST EXCISION     HIP ARTHROPLASTY Right 11/30/2019   Procedure: ARTHROPLASTY BIPOLAR HIP (HEMIARTHROPLASTY);  Surgeon: Corky Mull, MD;  Location: [facility] ORS;  Service: Orthopedics;  Laterality: Right;   THORACIC AORTIC ENDOVASCULAR STENT GRAFT N/A 04/11/2019   Procedure: THORACIC AORTIC ENDOVASCULAR STENT GRAFT insertion;  Surgeon: Serafina Mitchell, MD;  Location: [facility] OR;  Service: Vascular;  Laterality: N/A;   ULTRASOUND GUIDANCE FOR VASCULAR ACCESS Right 04/11/2019   Procedure: Ultrasound Guidance For Vascular Access, right femoral artery;  Surgeon: Serafina Mitchell, MD;  Location: [facility] OR;  Service: Vascular;  Laterality: Right;    Prior to Admission medications   Medication Sig Start Date End Date Taking? Authorizing Provider  acetaminophen (TYLENOL) 325 MG tablet Take 2 tablets (650 mg total) by mouth every 4 (four) hours as needed for moderate pain. 12/03/19   Arrien, Jimmy Picket, MD  amLODipine (NORVASC) 5 MG tablet Take 1 tablet (5 mg total) by mouth daily. 05/13/20 05/08/21  Carrie Mew, MD  aspirin EC 81 MG EC tablet Take 1 tablet (81 mg total) by mouth daily at 6 (six) AM. 04/13/19   Harvie Heck, MD  atorvastatin (LIPITOR) 20 MG tablet Take 1 tablet (20 mg total) by mouth daily at 6 PM. 04/12/19 11/21/20  Harvie Heck, MD  carvedilol (COREG) 6.25 MG tablet Take 1 tablet (6.25 mg total) by mouth 2 (two) times daily with a meal. Patient taking differently: Take 6.25 mg by mouth at bedtime. 04/12/19 12/16/20  Harvie Heck, MD  folic acid (FOLVITE) 1 MG tablet TAKE 1 TABLET EVERY DAY 03/28/14   [provider]  levothyroxine (SYNTHROID, LEVOTHROID) 75 MCG tablet Take 75 mcg by mouth daily.  02/01/14   [provider]  methotrexate (RHEUMATREX) 2.5 MG tablet TAKE 6 TABLETS (15 MG TOTAL) BY MOUTH EVERY 7 (SEVEN) DAYS FOR 168 DAYS 02/23/19   [provider]  mirabegron ER (MYRBETRIQ) 50 MG TB24 tablet Take 1 tablet (50 mg total) by mouth daily. Patient not taking: Reported on 12/16/2020 09/29/20   Bjorn Loser, MD  Multiple Vitamin (MULTIVITAMIN WITH MINERALS) TABS tablet Take 1 tablet by mouth daily.    [provider]  predniSONE (DELTASONE) 5 MG tablet Take 2.5 mg by mouth daily. 02/23/19   [provider]  QUEtiapine (SEROQUEL) 25 MG tablet Take 25 mg by mouth at bedtime. Patient not taking: Reported on 12/16/2020    [provider]  traZODone (DESYREL) 50 MG tablet Take 50 mg by mouth at bedtime. Patient not taking: Reported on 12/16/2020 12/31/19   [provider]    Allergies Amoxicillin, Codeine, Ibandronate sodium, Ibandronate sodium  [ibandronic acid], Risedronate, Risedronate sodium, and Sulfa antibiotics  Family History  Problem Relation Age of Onset   Kidney disease Neg Hx    Bladder Cancer Neg Hx     Social History Social History   Tobacco Use   Smoking status: Never   Smokeless tobacco: Never  Vaping Use   Vaping Use: Never used  Substance Use Topics   Alcohol use: No    Alcohol/week: 0.0 standard drinks   Drug use: Never    Review of Systems Constitutional: Negative for fever. Cardiovascular: Negative for chest pain. Respiratory: Negative for shortness of breath. Musculoskeletal: Positive for left pelvic and sacral/coccyx pain. Skin: No open wounds. Neurological: Negative for decrease in sensation  ____________________________________________   PHYSICAL EXAM:  VITAL SIGNS: ED Triage Vitals [12/16/20 0953]  Enc Vitals Group     BP (!) 146/66     Pulse Rate 78     Resp 18     Temp 98.5 F (36.9 C)     Temp Source Oral     SpO2 99 %     Weight 155 lb (70.3 kg)     Height '5\' 7"'$  (1.702 m)     Head  Circumference      Peak Flow      Pain Score 10     Pain Loc      Pain Edu?      Excl. in [place]?     Constitutional: Alert and oriented. Well appearing and in no acute distress. Eyes: Conjunctivae are clear without discharge or drainage Head: Atraumatic Neck: Supple Respiratory: No cough. Respirations are even and unlabored. Musculoskeletal: Very limited range of motion of the lower extremities secondary to pain in the pelvis. Neurologic: Awake, alert, oriented x4.  Motor and sensory function is intact. Skin: No open wounds or lesions. Psychiatric: Affect and behavior are appropriate.  ____________________________________________   LABS (all labs ordered are listed, but only abnormal results are displayed)  Labs Reviewed  BASIC METABOLIC PANEL - Abnormal; Notable for the following components:      Result Value   Glucose, Bld 101 (*)    Creatinine, Ser 0.43 (*)  Calcium 8.7 (*)    All other components within normal limits  SARS CORONAVIRUS 2 (TAT 6-24 HRS)  CBC WITH DIFFERENTIAL/PLATELET   ____________________________________________  RADIOLOGY  Repeat CT of the pelvis shows increased conspicuously of the left sacral alla fractures in the left superior and inferior pubic rami fractures likely related to bone resorption.  There is slight increased displacement of the left superior pubic ramus fracture.  I, Sherrie George, personally viewed and evaluated these images (plain radiographs) as part of my medical decision making, as well as reviewing the written report by the radiologist.  CT PELVIS WO CONTRAST  Result Date: 12/16/2020 CLINICAL DATA:  Pelvic fractures with increasing pain. EXAM: CT PELVIS WITHOUT CONTRAST TECHNIQUE: Multidetector CT imaging of the pelvis was performed following the standard protocol without intravenous contrast. COMPARISON:  11/21/2020 FINDINGS: Urinary Tract:  No abnormality visualized. Bowel: Sigmoid diverticulosis. No active diverticulitis. Pelvic  small bowel decompressed. Vascular/Lymphatic: Aorta iliac atherosclerosis.  No adenopathy Reproductive:  Prior hysterectomy.  No adnexal masses. Other:  No free fluid or free air. Musculoskeletal: Progressive fractures noted through the left sacral ala with fractures more apparent. No displacement. Increase conspicuity of the fracture may be related to bone resorption around the fracture. Fractures through the left superior and inferior pubic rami also better visualized, likely related to bone resorption. Some increased displacement of the superior pubic ramus fracture. Previously-seen extraperitoneal hematoma adjacent to the left superior pubic ramus has decreased since prior study. Prior right hip replacement. IMPRESSION: Increase conspicuity of the left sacral ala fractures and the left superior/inferior pubic rami fractures, likely related to bone resorption. There is slight increased displacement of the left superior pubic ramus fracture. Electronically Signed   By: Rolm Baptise M.D.   On: 12/16/2020 12:34   ____________________________________________   PROCEDURES  Procedures  ____________________________________________   INITIAL IMPRESSION / ASSESSMENT AND PLAN / ED COURSE  Alazae Sawhney [name] is a 87 y.o. who presents to the emergency department for treatment and evaluation of worsening pain in the sacrum and pelvis after sustaining a fall nearly 1 month ago.  She is awaiting sacroplasty but is having some insurance issues.  While here, she was given 2 mg of Valium with some improvement in pain.  She would like to be discharged home and is requesting that we call her son. She is to follow up with orthopedics as scheduled or sooner for concerns.  ----------------------------------------- 2:50 PM on 12/16/2020 ----------------------------------------- Once son arrived, patient reporting pain as severe as upon arrival. Offered admission for pain control and initially declined. After speaking  with a family member that is a physician, she has changed her mind and would like to stay. This seems reasonable as she has not improved with norco, oxycodone, and valium. Will get basic labs, COVID, and speak with orthopedics then hospitalist.   ----------------------------------------- 3:16 PM on 12/16/2020 ----------------------------------------- Discussed with Dr. Marry Guan (on call for [place]) who will try and make Dr. [name] aware of her unrelenting pain and pending admission.  ----------------------------------------- 3:46 PM on 12/16/2020 ----------------------------------------- Patient accepted for admission by Dr. Porfirio Mylar.  Medications  diazepam (VALIUM) tablet 2 mg (2 mg Oral Given 12/16/20 1148)  fentaNYL (SUBLIMAZE) injection 25 mcg (25 mcg Intravenous Given 12/16/20 1528)  ondansetron (ZOFRAN) injection 4 mg (4 mg Intravenous Given 12/16/20 1527)    Pertinent labs & imaging results that were available during my care of the patient were reviewed by me and considered in my medical decision making (see chart for details).  _________________________________________   FINAL CLINICAL IMPRESSION(S) / ED DIAGNOSES  Final diagnoses:  Multiple closed fractures of pelvis with stable disruption of pelvic ring with routine healing, subsequent encounter    ED Discharge Orders     None        If controlled substance prescribed during this visit, 12 month history viewed on the [place] prior to issuing an initial prescription for Schedule II or III opiod.    Victorino Dike, FNP 12/16/20 1546    Naaman Plummer, MD 12/17/20 [phone]

## 2020-12-16 NOTE — Consult Note (Signed)
Reason for Consult: Sacral insufficiency fractures Referring Physician: ER  Gloria [name] is an 87 y.o. female.  HPI: Patient is a 87 year old who suffered a fall over a month ago she subsequently had a CT that showed fracture and repeat CT confirms this and shows worsening of her sacral fracture left worse than right.  We have been trying to get insurance authorization at my office as she was seen by one of our PAs but have been unable to get that.  She is currently in a rehab bed at [facility] secondary to that her pain and inability to ambulate.  She is in such pain today her daughter brought her to the emergency room where she is being admitted.  Past Medical History:  Diagnosis Date   Arthritis    Collagen vascular disease ([place])    RA   Heartburn    Hypertension     Past Surgical History:  Procedure Laterality Date   ABDOMINAL HYSTERECTOMY     BREAST CYST EXCISION     HIP ARTHROPLASTY Right 11/30/2019   Procedure: ARTHROPLASTY BIPOLAR HIP (HEMIARTHROPLASTY);  Surgeon: Corky Mull, MD;  Location: [facility] ORS;  Service: Orthopedics;  Laterality: Right;   THORACIC AORTIC ENDOVASCULAR STENT GRAFT N/A 04/11/2019   Procedure: THORACIC AORTIC ENDOVASCULAR STENT GRAFT insertion;  Surgeon: Serafina Mitchell, MD;  Location: [facility] OR;  Service: Vascular;  Laterality: N/A;   ULTRASOUND GUIDANCE FOR VASCULAR ACCESS Right 04/11/2019   Procedure: Ultrasound Guidance For Vascular Access, right femoral artery;  Surgeon: Serafina Mitchell, MD;  Location: [facility] OR;  Service: Vascular;  Laterality: Right;    Family History  Problem Relation Age of Onset   Kidney disease Neg Hx    Bladder Cancer Neg Hx     Social History:  reports that she has never smoked. She has never used smokeless tobacco. She reports that she does not drink alcohol and does not use drugs.  Allergies:  Allergies  Allergen Reactions   Amoxicillin Other (See Comments)    intolerant   Codeine Nausea Only   Ibandronate Sodium     Ibandronate Sodium  [Ibandronic Acid] Other (See Comments)    Gi upset   Risedronate Other (See Comments)    Gi upset   Risedronate Sodium    Sulfa Antibiotics Hives    Medications: I have reviewed the patient's current medications.  Results for orders placed or performed during the hospital encounter of 12/16/20 (from the past 48 hour(s))  Basic metabolic panel     Status: Abnormal   Collection Time: 12/16/20  3:12 PM  Result Value Ref Range   Sodium 137 135 - 145 mmol/L   Potassium 3.7 3.5 - 5.1 mmol/L    Comment: HEMOLYSIS AT THIS LEVEL MAY AFFECT RESULT   Chloride 102 98 - 111 mmol/L   CO2 24 22 - 32 mmol/L   Glucose, Bld 101 (H) 70 - 99 mg/dL    Comment: Glucose reference range applies only to samples taken after fasting for at least 8 hours.   BUN 10 8 - 23 mg/dL   Creatinine, Ser 0.43 (L) 0.44 - 1.00 mg/dL   Calcium 8.7 (L) 8.9 - 10.3 mg/dL   GFR, Estimated >60 >60 mL/min    Comment: (NOTE) Calculated using the CKD-EPI Creatinine Equation (2021)    Anion gap 11 5 - 15    Comment: Performed at [facility], [address], [place],  [address]  CBC with Differential     Status: Abnormal  Collection Time: 12/16/20  3:12 PM  Result Value Ref Range   WBC 10.1 4.0 - 10.5 K/uL   RBC 3.95 3.87 - 5.11 MIL/uL   Hemoglobin 12.6 12.0 - 15.0 g/dL   HCT 37.8 36.0 - 46.0 %   MCV 95.7 80.0 - 100.0 fL   MCH 31.9 26.0 - 34.0 pg   MCHC 33.3 30.0 - 36.0 g/dL   RDW 16.4 (H) 11.5 - 15.5 %   Platelets 210 150 - 400 K/uL   nRBC 0.0 0.0 - 0.2 %   Neutrophils Relative % 68 %   Neutro Abs 6.9 1.7 - 7.7 K/uL   Lymphocytes Relative 18 %   Lymphs Abs 1.8 0.7 - 4.0 K/uL   Monocytes Relative 9 %   Monocytes Absolute 0.9 0.1 - 1.0 K/uL   Eosinophils Relative 3 %   Eosinophils Absolute 0.3 0.0 - 0.5 K/uL   Basophils Relative 1 %   Basophils Absolute 0.1 0.0 - 0.1 K/uL   WBC Morphology MORPHOLOGY UNREMARKABLE    RBC Morphology MORPHOLOGY UNREMARKABLE    Smear Review  PLATELET CLUMPS NOTED ON SMEAR, UNABLE TO ESTIMATE    Immature Granulocytes 1 %   Abs Immature Granulocytes 0.06 0.00 - 0.07 K/uL    Comment: Performed at [facility], [address] [place], [place] [address]    CT PELVIS WO CONTRAST  Result Date: 12/16/2020 CLINICAL DATA:  Pelvic fractures with increasing pain. EXAM: CT PELVIS WITHOUT CONTRAST TECHNIQUE: Multidetector CT imaging of the pelvis was performed following the standard protocol without intravenous contrast. COMPARISON:  11/21/2020 FINDINGS: Urinary Tract:  No abnormality visualized. Bowel: Sigmoid diverticulosis. No active diverticulitis. Pelvic small bowel decompressed. Vascular/Lymphatic: Aorta iliac atherosclerosis.  No adenopathy Reproductive:  Prior hysterectomy.  No adnexal masses. Other:  No free fluid or free air. Musculoskeletal: Progressive fractures noted through the left sacral ala with fractures more apparent. No displacement. Increase conspicuity of the fracture may be related to bone resorption around the fracture. Fractures through the left superior and inferior pubic rami also better visualized, likely related to bone resorption. Some increased displacement of the superior pubic ramus fracture. Previously-seen extraperitoneal hematoma adjacent to the left superior pubic ramus has decreased since prior study. Prior right hip replacement. IMPRESSION: Increase conspicuity of the left sacral ala fractures and the left superior/inferior pubic rami fractures, likely related to bone resorption. There is slight increased displacement of the left superior pubic ramus fracture. Electronically Signed   By: Rolm Baptise M.D.   On: 12/16/2020 12:34    Review of Systems Blood pressure (!) 163/64, pulse 66, temperature 98.4 F (36.9 C), temperature source Oral, resp. rate 18, height '5\' 7"'$  ([id number] m), weight 70.3 kg, SpO2 98 %. Physical Exam She is tender to palpation over the sacrum right and left sides not in the midline.  She  had a flex extend her toes and has no neurodeficit.  Skin is intact. Assessment/Plan: Sacral insufficiency fractures worsening with unrelenting pain Plan is for lumbosacral vertebroplasty at the S1 and 2 levels bilaterally tomorrow.  Risks were discussed with the patient as well as brochure given for his information preoperatively.  Hessie Knows 12/16/2020, 5:24 PM

## 2020-12-16 NOTE — ED Triage Notes (Signed)
Pt here with broken tailbone pain. Pt is scheduled to have it fixed later this month but states that pain was too bad today and decided to come into the ED for an evaluation.

## 2020-12-17 ENCOUNTER — Inpatient Hospital Stay: Payer: Medicare PPO

## 2020-12-17 ENCOUNTER — Inpatient Hospital Stay: Payer: Medicare PPO | Admitting: Anesthesiology

## 2020-12-17 ENCOUNTER — Encounter
Admission: EM | Disposition: A | Discharge status: Home / Self Care | Payer: Self-pay | Source: Home / Self Care | Attending: Emergency Medicine

## 2020-12-17 ENCOUNTER — Encounter: Payer: Self-pay | Admitting: Internal Medicine

## 2020-12-17 DIAGNOSIS — S3282XG Multiple fractures of pelvis without disruption of pelvic ring, subsequent encounter for fracture with delayed healing: Secondary | ICD-10-CM

## 2020-12-17 HISTORY — PX: SACROPLASTY: SHX6797

## 2020-12-17 LAB — CBC
HCT: 35.5 % — ABNORMAL LOW (ref 36.0–46.0)
Hemoglobin: 11.6 g/dL — ABNORMAL LOW (ref 12.0–15.0)
MCH: 31.3 pg (ref 26.0–34.0)
MCHC: 32.7 g/dL (ref 30.0–36.0)
MCV: 95.7 fL (ref 80.0–100.0)
Platelets: 229 10*3/uL (ref 150–400)
RBC: 3.71 MIL/uL — ABNORMAL LOW (ref 3.87–5.11)
RDW: 16.5 % — ABNORMAL HIGH (ref 11.5–15.5)
WBC: 13 10*3/uL — ABNORMAL HIGH (ref 4.0–10.5)
nRBC: 0 % (ref 0.0–0.2)

## 2020-12-17 LAB — SURGICAL PCR SCREEN
MRSA, PCR: NEGATIVE
Staphylococcus aureus: NEGATIVE

## 2020-12-17 LAB — CREATININE, SERUM
Creatinine, Ser: 0.62 mg/dL (ref 0.44–1.00)
GFR, Estimated: >60 mL/min (ref >60)

## 2020-12-17 LAB — SARS CORONAVIRUS 2 (TAT 6-24 HRS): SARS Coronavirus 2: NEGATIVE

## 2020-12-17 SURGERY — SACROPLASTY
Anesthesia: General | Site: Buttocks

## 2020-12-17 MED ORDER — PROPOFOL 10 MG/ML IV BOLUS
INTRAVENOUS | Status: DC | PRN
  Administered 2020-12-17: 20 mg via INTRAVENOUS
  Administered 2020-12-17: 30 mg via INTRAVENOUS

## 2020-12-17 MED ORDER — POLYETHYLENE GLYCOL 3350 17 G PO PACK: 17.0000 g | PACK | Freq: Every day | ORAL | Status: DC | PRN

## 2020-12-17 MED ORDER — CEFAZOLIN SODIUM-DEXTROSE 2-4 GM/100ML-% IV SOLN: 2.0000 g | INTRAVENOUS | Status: DC

## 2020-12-17 MED ORDER — FENTANYL CITRATE (PF) 100 MCG/2ML IJ SOLN
INTRAMUSCULAR | Status: AC
  Administered 2020-12-17: 25 ug via INTRAVENOUS
  Filled 2020-12-17: qty 2

## 2020-12-17 MED ORDER — PREDNISONE 2.5 MG PO TABS
2.5000 mg | ORAL_TABLET | Freq: Every day | ORAL | Status: DC
  Administered 2020-12-17 – 2020-12-18 (×2): 2.5 mg via ORAL
  Filled 2020-12-17 (×2): qty 1

## 2020-12-17 MED ORDER — LIDOCAINE HCL (PF) 1 % IJ SOLN
INTRAMUSCULAR | Status: DC | PRN
  Administered 2020-12-17: 10 mL
  Administered 2020-12-17: 20 mL

## 2020-12-17 MED ORDER — POLYETHYLENE GLYCOL 3350 17 G PO PACK
17.0000 g | PACK | Freq: Every day | ORAL | Status: DC
  Administered 2020-12-17: 17 g via ORAL
  Filled 2020-12-17 (×2): qty 1

## 2020-12-17 MED ORDER — ENSURE ENLIVE PO LIQD
237.0000 mL | Freq: Two times a day (BID) | ORAL | Status: DC
  Administered 2020-12-18: 237 mL via ORAL

## 2020-12-17 MED ORDER — SENNA 8.6 MG PO TABS
1.0000 | ORAL_TABLET | Freq: Every day | ORAL | Status: DC
  Administered 2020-12-17 – 2020-12-18 (×2): 8.6 mg via ORAL
  Filled 2020-12-17 (×2): qty 1

## 2020-12-17 MED ORDER — INFLUENZA VAC A&B SA ADJ QUAD 0.5 ML IM PRSY
0.5000 mL | PREFILLED_SYRINGE | INTRAMUSCULAR | Status: DC
  Filled 2020-12-17: qty 0.5

## 2020-12-17 MED ORDER — ONDANSETRON HCL 4 MG/2ML IJ SOLN: 4.0000 mg | Freq: Once | INTRAMUSCULAR | Status: DC | PRN

## 2020-12-17 MED ORDER — BUPIVACAINE-EPINEPHRINE (PF) 0.5% -1:200000 IJ SOLN
INTRAMUSCULAR | Status: AC
  Filled 2020-12-17: qty 30

## 2020-12-17 MED ORDER — ENOXAPARIN SODIUM 40 MG/0.4ML IJ SOSY
40.0000 mg | PREFILLED_SYRINGE | INTRAMUSCULAR | Status: DC
  Administered 2020-12-18: 40 mg via SUBCUTANEOUS
  Filled 2020-12-17: qty 0.4

## 2020-12-17 MED ORDER — PROPOFOL 500 MG/50ML IV EMUL
INTRAVENOUS | Status: DC | PRN
  Administered 2020-12-17: 45 ug/kg/min via INTRAVENOUS

## 2020-12-17 MED ORDER — ONDANSETRON HCL 4 MG/2ML IJ SOLN: 4.0000 mg | Freq: Four times a day (QID) | INTRAMUSCULAR | Status: DC | PRN

## 2020-12-17 MED ORDER — FENTANYL CITRATE (PF) 100 MCG/2ML IJ SOLN: 25.0000 ug | INTRAMUSCULAR | Status: DC | PRN

## 2020-12-17 MED ORDER — PROPOFOL 10 MG/ML IV BOLUS
INTRAVENOUS | Status: AC
  Filled 2020-12-17: qty 40

## 2020-12-17 MED ORDER — CHLORHEXIDINE GLUCONATE 0.12 % MT SOLN: 15.0000 mL | Freq: Once | OROMUCOSAL | Status: DC

## 2020-12-17 MED ORDER — ORAL CARE MOUTH RINSE: 15.0000 mL | Freq: Once | OROMUCOSAL | Status: DC

## 2020-12-17 MED ORDER — FENTANYL CITRATE (PF) 100 MCG/2ML IJ SOLN
INTRAMUSCULAR | Status: DC | PRN
  Administered 2020-12-17 (×5): 25 ug via INTRAVENOUS

## 2020-12-17 MED ORDER — ONDANSETRON HCL 4 MG PO TABS: 4.0000 mg | ORAL_TABLET | Freq: Four times a day (QID) | ORAL | Status: DC | PRN

## 2020-12-17 MED ORDER — LACTATED RINGERS IV SOLN: INTRAVENOUS | Status: DC

## 2020-12-17 MED ORDER — DOCUSATE SODIUM 100 MG PO CAPS
100.0000 mg | ORAL_CAPSULE | Freq: Two times a day (BID) | ORAL | Status: DC
  Administered 2020-12-17 – 2020-12-18 (×2): 100 mg via ORAL
  Filled 2020-12-17 (×2): qty 1

## 2020-12-17 MED ORDER — BISACODYL 10 MG RE SUPP: 10.0000 mg | Freq: Every day | RECTAL | Status: DC | PRN

## 2020-12-17 MED ORDER — FENTANYL CITRATE (PF) 250 MCG/5ML IJ SOLN
INTRAMUSCULAR | Status: AC
  Filled 2020-12-17: qty 5

## 2020-12-17 MED ORDER — BUPIVACAINE-EPINEPHRINE (PF) 0.5% -1:200000 IJ SOLN
INTRAMUSCULAR | Status: DC | PRN
  Administered 2020-12-17: 20 mL via PERINEURAL

## 2020-12-17 MED ORDER — LIDOCAINE HCL (PF) 1 % IJ SOLN
INTRAMUSCULAR | Status: AC
  Filled 2020-12-17: qty 30

## 2020-12-17 SURGICAL SUPPLY — 21 items
CEMENT KYPHON CX01A KIT/MIXER (Cement) ×2 IMPLANT
DERMABOND ADVANCED (GAUZE/BANDAGES/DRESSINGS) ×1
DERMABOND ADVANCED .7 DNX12 (GAUZE/BANDAGES/DRESSINGS) ×1 IMPLANT
DRAPE C-ARM XRAY 36X54 (DRAPES) ×2 IMPLANT
DURAPREP 26ML APPLICATOR (WOUND CARE) ×2 IMPLANT
GAUZE 4X4 16PLY ~~LOC~~+RFID DBL (SPONGE) ×2 IMPLANT
GLOVE SURG SYN 9.0  PF PI (GLOVE) ×2
GLOVE SURG SYN 9.0 PF PI (GLOVE) ×1 IMPLANT
GOWN SRG 2XL LVL 4 RGLN SLV (GOWNS) ×1 IMPLANT
GOWN STRL NON-REIN 2XL LVL4 (GOWNS) ×2
GOWN STRL REUS W/ TWL LRG LVL3 (GOWN DISPOSABLE) ×1 IMPLANT
GOWN STRL REUS W/TWL LRG LVL3 (GOWN DISPOSABLE) ×2
KIT OSTEOCOOL BONE ACCESS 10G (MISCELLANEOUS) ×6 IMPLANT
KIT TURNOVER KIT A (KITS) ×2 IMPLANT
MANIFOLD NEPTUNE II (INSTRUMENTS) IMPLANT
PACK KYPHOPLASTY (MISCELLANEOUS) ×2 IMPLANT
SWABSTK COMLB BENZOIN TINCTURE (MISCELLANEOUS) ×2 IMPLANT
SYS CARTRIDGE BONE CEMENT 8ML (SYSTAGENIX WOUND MANAGEMENT) ×2
SYSTEM CARTRIDG BONE CEMNT 8ML (SYSTAGENIX WOUND MANAGEMENT) ×1 IMPLANT
SYSTEM GUN BONE FILLER SZ2 (MISCELLANEOUS) ×2 IMPLANT
WATER STERILE IRR 500ML POUR (IV SOLUTION) ×2 IMPLANT

## 2020-12-17 NOTE — Progress Notes (Addendum)
PROGRESS NOTE    Gloria [name]  [id number] DOB: 1926/01/31 DOA: 12/16/2020 PCP: Adin Hector, MD  Outpatient Specialists: rheumatology, vascular surgery    Brief Narrative:   History right hip fracture last year s/p operative repair. Now with fall last month with sacral and pubic rami fractures and worsening pain, admitted for pain control and operative repair.   Assessment & Plan:   Principal Problem:   Closed pelvic fracture ([place]) Active Problems:   Hypothyroidism   GERD   Rheumatoid arthritis ([place])   Polymyalgia rheumatica (H[facility])   Essential hypertension   HLD (hyperlipidemia)   Depression   # Sacral fractures # Pubic rami fractures Ortho planning on s1/s2 vertebroplasty today. Pain appears controlled. No hx CAD and denies anginal equivalents. - NPO for surgery later today - pain control miralax, convert to oral after surgery - miralax/senna - pt/ot consults tomorrow  # Rheumatoid arthritis On low dose prednisone at home. Received stress dose here; given low dose do not think needs - resume home prednisone 2.5 mg qd - cont home methotrexate, folic acid  # Hypothyroid - home synthroid  # HTN Here bp controlled - home amlodipine, coreg  # Overactive bladder - home myrbetriq  DVT prophylaxis: SCDs Code Status: DNR Family Communication: daughter updated telephonically 9/14  Level of care: Med-Surg Status is: Inpatient  Remains inpatient appropriate because:Inpatient level of care appropriate due to severity of illness  Dispo: The patient is from: SNF              Anticipated d/c is to: SNF              Patient currently is not medically stable to d/c.   Difficult to place patient No        Consultants:  orthopedics  Procedures: pending  Antimicrobials:  none    Subjective: This morning feels depressed. Pain controlled. No chest pain.  Objective: Vitals:   12/16/20 1730 12/16/20 2112 12/17/20 0333 12/17/20 0733  BP: (!)  153/67 (!) 150/66 (!) 143/59 (!) 144/68  Pulse: 66 68 62 66  Resp: '16 19 16 16  '$ Temp:  98.8 F (37.1 C) 98.5 F (36.9 C) 98.1 F (36.7 C)  TempSrc:      SpO2: 95% 98% 94% 96%  Weight:      Height:        Intake/Output Summary (Last 24 hours) at 12/17/2020 0838 Last data filed at 12/17/2020 0612 Gross per 24 hour  Intake --  Output 250 ml  Net -250 ml   Filed Weights   12/16/20 0953  Weight: 70.3 kg    Examination:  General exam: Appears calm and comfortable  Respiratory system: Clear to auscultation. Respiratory effort normal. Cardiovascular system: S1 & S2 heard, RRR. Moderate holosystolic murmur Gastrointestinal system: Abdomen is nondistended, soft and nontender. No organomegaly or masses felt. Normal bowel sounds heard. Central nervous system: Alert and oriented. No focal neurological deficits. Extremities: able to move all 4 Skin: No rashes, lesions or ulcers Psychiatry: Judgement and insight appear normal. Mood is depressed    Data Reviewed: I have personally reviewed following labs and imaging studies  CBC: Recent Labs  Lab 12/16/20 1512  WBC 10.1  NEUTROABS 6.9  HGB 12.6  HCT 37.8  MCV 95.7  PLT [id number]   Basic Metabolic Panel: Recent Labs  Lab 12/16/20 1512  NA 137  K 3.7  CL 102  CO2 24  GLUCOSE 101*  BUN 10  CREATININE 0.43*  CALCIUM 8.7*  GFR: Estimated Creatinine Clearance: 42.7 mL/min (A) (by C-G formula based on SCr of 0.43 mg/dL (L)). Liver Function Tests: No results for input(s): AST, ALT, ALKPHOS, BILITOT, PROT, ALBUMIN in the last 168 hours. No results for input(s): LIPASE, AMYLASE in the last 168 hours. No results for input(s): AMMONIA in the last 168 hours. Coagulation Profile: Recent Labs  Lab 12/16/20 2142  INR 1.0   Cardiac Enzymes: No results for input(s): CKTOTAL, CKMB, CKMBINDEX, TROPONINI in the last 168 hours. BNP (last 3 results) No results for input(s): PROBNP in the last 8760 hours. HbA1C: No results for  input(s): HGBA1C in the last 72 hours. CBG: No results for input(s): GLUCAP in the last 168 hours. Lipid Profile: No results for input(s): CHOL, HDL, LDLCALC, TRIG, CHOLHDL, LDLDIRECT in the last 72 hours. Thyroid Function Tests: No results for input(s): TSH, T4TOTAL, FREET4, T3FREE, THYROIDAB in the last 72 hours. Anemia Panel: No results for input(s): VITAMINB12, FOLATE, FERRITIN, TIBC, IRON, RETICCTPCT in the last 72 hours. Urine analysis:    Component Value Date/Time   COLORURINE YELLOW (A) 11/21/2020 0836   APPEARANCEUR CLOUDY (A) 11/21/2020 0836   APPEARANCEUR Hazy (A) 08/29/2020 1113   LABSPEC 1.008 11/21/2020 0836   PHURINE 8.0 11/21/2020 0836   GLUCOSEU NEGATIVE 11/21/2020 0836   HGBUR NEGATIVE 11/21/2020 0836   BILIRUBINUR NEGATIVE 11/21/2020 0836   BILIRUBINUR Negative 07/28/2020 0902   KETONESUR NEGATIVE 11/21/2020 0836   PROTEINUR NEGATIVE 11/21/2020 0836   NITRITE NEGATIVE 11/21/2020 0836   LEUKOCYTESUR NEGATIVE 11/21/2020 0836   Sepsis Labs: '@LABRCNTIP'$ (procalcitonin:4,lacticidven:4)  ) Recent Results (from the past 240 hour(s))  SARS CORONAVIRUS 2 (TAT 6-24 HRS) Nasopharyngeal Nasopharyngeal Swab     Status: None   Collection Time: 12/16/20  2:56 PM   Specimen: Nasopharyngeal Swab  Result Value Ref Range Status   SARS Coronavirus 2 NEGATIVE NEGATIVE Final    Comment: (NOTE) SARS-CoV-2 target nucleic acids are NOT DETECTED.  The SARS-CoV-2 RNA is generally detectable in upper and lower respiratory specimens during the acute phase of infection. Negative results do not preclude SARS-CoV-2 infection, do not rule out co-infections with other pathogens, and should not be used as the sole basis for treatment or other patient management decisions. Negative results must be combined with clinical observations, patient history, and epidemiological information. The expected result is Negative.  Fact Sheet for  Patients: SugarRoll.be  Fact Sheet for Healthcare Providers: https://www.woods-mathews.com/  This test is not yet approved or cleared by the [place] FDA and  has been authorized for detection and/or diagnosis of SARS-CoV-2 by FDA under an Emergency Use Authorization (EUA). This EUA will remain  in effect (meaning this test can be used) for the duration of the COVID-19 declaration under Se ction 564(b)(1) of the Act, 21 U.S.C. section 360bbb-3(b)(1), unless the authorization is terminated or revoked sooner.  Performed at [facility], [place] [address], [place], [place] [address]          Radiology Studies: CT PELVIS WO CONTRAST  Result Date: 12/16/2020 CLINICAL DATA:  Pelvic fractures with increasing pain. EXAM: CT PELVIS WITHOUT CONTRAST TECHNIQUE: Multidetector CT imaging of the pelvis was performed following the standard protocol without intravenous contrast. COMPARISON:  11/21/2020 FINDINGS: Urinary Tract:  No abnormality visualized. Bowel: Sigmoid diverticulosis. No active diverticulitis. Pelvic small bowel decompressed. Vascular/Lymphatic: Aorta iliac atherosclerosis.  No adenopathy Reproductive:  Prior hysterectomy.  No adnexal masses. Other:  No free fluid or free air. Musculoskeletal: Progressive fractures noted through the left sacral ala with fractures  more apparent. No displacement. Increase conspicuity of the fracture may be related to bone resorption around the fracture. Fractures through the left superior and inferior pubic rami also better visualized, likely related to bone resorption. Some increased displacement of the superior pubic ramus fracture. Previously-seen extraperitoneal hematoma adjacent to the left superior pubic ramus has decreased since prior study. Prior right hip replacement. IMPRESSION: Increase conspicuity of the left sacral ala fractures and the left superior/inferior pubic rami fractures, likely related to  bone resorption. There is slight increased displacement of the left superior pubic ramus fracture. Electronically Signed   By: Rolm Baptise M.D.   On: 12/16/2020 12:34        Scheduled Meds:  amLODipine  5 mg Oral Daily   carvedilol  6.25 mg Oral QHS   folic acid  1 mg Oral Daily   [START ON 12/18/2020] influenza vaccine adjuvanted  0.5 mL Intramuscular Tomorrow-1000   levothyroxine  75 mcg Oral Q0600   [START ON 12/19/2020] methotrexate  15 mg Oral Q Fri   multivitamin with minerals  1 tablet Oral Daily   predniSONE  7.5 mg Oral Daily   Continuous Infusions:   ceFAZolin (ANCEF) IV       LOS: 1 day    Time spent: 35 min    Desma Maxim, MD [facility]   If 7PM-7AM, please contact night-coverage [email or web address] Password [facility] 12/17/2020, 8:38 AM

## 2020-12-17 NOTE — TOC Initial Note (Signed)
Transition of Care [facility]) - Initial/Assessment Note    Patient Details  Name: Gloria [name] MRN: [id number] Date of Birth: [birth year]  Transition of Care [facility] CM/SW Contact:    Su Hilt, RN Phone Number: 12/17/2020, 9:45 AM  Clinical Narrative:        Patient is to have Surgery later today, then PT will evaluate to determine recommendation, TOC to see after                 Patient Goals and CMS Choice        Expected Discharge Plan and Services                                                Prior Living Arrangements/Services                       Activities of Daily Living Home Assistive Devices/Equipment: Eyeglasses, Gilford Rile (specify type) ADL Screening (condition at time of admission) Patient's cognitive ability adequate to safely complete daily activities?: Yes Is the patient deaf or have difficulty hearing?: No Does the patient have difficulty seeing, even when wearing glasses/contacts?: No Does the patient have difficulty concentrating, remembering, or making decisions?: No Patient able to express need for assistance with ADLs?: Yes Does the patient have difficulty dressing or bathing?: Yes Independently performs ADLs?: No Communication: Independent Dressing (OT): Needs assistance Is this a change from baseline?: Pre-admission baseline Grooming: Needs assistance Is this a change from baseline?: Pre-admission baseline Feeding: Independent Bathing: Needs assistance Is this a change from baseline?: Pre-admission baseline Toileting: Needs assistance Is this a change from baseline?: Pre-admission baseline In/Out Bed: Needs assistance Is this a change from baseline?: Pre-admission baseline Walks in Home: Needs assistance Is this a change from baseline?: Pre-admission baseline Does the patient have difficulty walking or climbing stairs?: Yes Weakness of Legs: Both Weakness of Arms/Hands: Both  Permission Sought/Granted                   Emotional Assessment              Admission diagnosis:  Closed pelvic fracture ([place]) [S32.9XXA] Multiple closed fractures of pelvis with stable disruption of pelvic ring with routine healing, subsequent encounter [S32.810D] Patient Active Problem List   Diagnosis Date Noted   Closed pelvic fracture ([place]) 12/16/2020   HLD (hyperlipidemia) 12/16/2020   Depression 12/16/2020   Migraines 02/05/2020   Recurrent major depressive disorder, in full remission ([place]) 12/31/2019   Closed displaced midcervical fracture of right femur ([place]) 12/03/2019   Status post hip hemiarthroplasty 12/03/2019   Closed right hip fracture ([place]) 11/30/2019   Hip fracture ([place]) 11/30/2019   History of fracture of right hip 11/30/2019   Acute pain of right shoulder    Fall    Immunosuppression ([place]) 05/08/2019   Intramural hematoma of thoracic aorta ([place]) 04/08/2019   Thoracic ascending aortic aneurysm ([place]) 04/07/2019   Anemia 02/14/2018   Peripheral vascular disease ([place]) 09/14/2017   Aneurysm of ascending aorta ([place]) 02/14/2015   Essential hypertension 01/21/2014   Osteopenia 07/19/2013   Hypothyroidism 02/01/2007   GERD 02/01/2007   Rheumatoid arthritis ([place]) 02/01/2007   Disorder of bone and cartilage 02/01/2007   COLONIC POLYPS, HX OF 02/01/2007   DISEASE, CELIAC 04/05/2002   Polymyalgia rheumatica ([place]) 04/05/2001   PCP:  Tama High  III, MD Pharmacy:   [place], [place] - [place] [address] Phone: [phone] Fax: [phone]     Social Determinants of Health (SDOH) Interventions    Readmission Risk Interventions No flowsheet data found.

## 2020-12-17 NOTE — Anesthesia Procedure Notes (Signed)
Date/Time: 12/17/2020 3:40 PM Performed by: Johnna Acosta, CRNA Pre-anesthesia Checklist: Patient identified, Emergency Drugs available, Suction available and Patient being monitored Patient Re-evaluated:Patient Re-evaluated prior to induction Oxygen Delivery Method: Nasal cannula Preoxygenation: Pre-oxygenation with 100% oxygen Induction Type: IV induction

## 2020-12-17 NOTE — Progress Notes (Signed)
Discussed surgery with patient again, site marked.  Plan sacroplasty later today

## 2020-12-17 NOTE — Anesthesia Preprocedure Evaluation (Signed)
Anesthesia Evaluation  Patient identified by MRN, date of birth, ID band Patient awake    Reviewed: Allergy & Precautions, H&P , NPO status , Patient's Chart, lab work & pertinent test results, reviewed documented beta blocker date and time   Airway Mallampati: III  TM Distance: >3 FB Neck ROM: full    Dental no notable dental hx. (+) Teeth Intact   Pulmonary neg pulmonary ROS,    Pulmonary exam normal breath sounds clear to auscultation       Cardiovascular Exercise Tolerance: Good hypertension, On Medications negative cardio ROS   Rhythm:regular Rate:Normal     Neuro/Psych  Headaches, PSYCHIATRIC DISORDERS Depression    GI/Hepatic Neg liver ROS, GERD  Medicated,  Endo/Other  diabetes, Well ControlledHypothyroidism   Renal/GU      Musculoskeletal   Abdominal   Peds  Hematology  (+) Blood dyscrasia, anemia ,   Anesthesia Other Findings   Reproductive/Obstetrics negative OB ROS                             Anesthesia Physical Anesthesia Plan  ASA: 3  Anesthesia Plan: MAC   Post-op Pain Management:    Induction:   PONV Risk Score and Plan: 3  Airway Management Planned:   Additional Equipment:   Intra-op Plan:   Post-operative Plan:   Informed Consent: I have reviewed the patients History and Physical, chart, labs and discussed the procedure including the risks, benefits and alternatives for the proposed anesthesia with the patient or authorized representative who has indicated his/her understanding and acceptance.       Plan Discussed with: CRNA  Anesthesia Plan Comments:         Anesthesia Quick Evaluation

## 2020-12-17 NOTE — Op Note (Signed)
12/17/2020  4:31 PM  PATIENT:  Gloria [name]  87 y.o. female  PRE-OPERATIVE DIAGNOSIS:  sacral fracture  POST-OPERATIVE DIAGNOSIS:  sacral fracture  PROCEDURE:  Procedure(s): SACROPLASTY (N/A)  SURGEON: Laurene Footman, MD  ASSISTANTS: None  ANESTHESIA:   local and MAC  EBL:  Total I/O In: 350 [I.V.:300; IV Piggyback:50] Out: 0   BLOOD ADMINISTERED:none  DRAINS: none   LOCAL MEDICATIONS USED:  MARCAINE    and XYLOCAINE   SPECIMEN:  No Specimen  DISPOSITION OF SPECIMEN:  N/A  COUNTS:  YES  TOURNIQUET:  * No tourniquets in log *  IMPLANTS: Bone cement  DICTATION: .Dragon Dictation patient was brought to the operating room and after adequate sedation was given she is placed in a prone position.  C arm was brought in and good visualization of the sacrum was obtained in both AP and lateral projections.  After patient identification and timeout procedures were skin was cleaned with alcohol swabs and 10 cc of 1% Xylocaine was infiltrated on both sides of the planned incisions.  Next the back was prepped and draped in the usual sterile manner.  A spinal needle was used to get local down to the sacrum in the appropriate position on each side using 10 cc of half percent Sensorcaine plain and 1% Xylocaine.  This was allowed to set small incisions were made and trochars advanced between the neuroforamen and the SI joint on both sides getting up to S1.  Drilling was carried out and when the cement was appropriate consistency approximately 5 cc was obtained in both sides getting good fill and S1 and S2 without extravasation there is no extravasation into the SI joint and or into the neuroforamen.  When the cement had set both trochars removed and permanent C arm view obtained.  Wounds were closed with Dermabond followed by Band-Aids.  PLAN OF CARE: Continue as inpatient  PATIENT DISPOSITION:  PACU - hemodynamically stable.

## 2020-12-17 NOTE — Transfer of Care (Signed)
Immediate Anesthesia Transfer of Care Note  Patient: Gloria [name]  Procedure(s) Performed: SACROPLASTY (Buttocks)  Patient Location: PACU  Anesthesia Type:General  Level of Consciousness: awake, alert  and oriented  Airway & Oxygen Therapy: Patient Spontanous Breathing and Patient connected to nasal cannula oxygen  Post-op Assessment: Report given to RN and Post -op Vital signs reviewed and stable  Post vital signs: Reviewed and stable  Last Vitals:  Vitals Value Taken Time  BP 152/60 12/17/20 1625  Temp    Pulse 69 12/17/20 1626  Resp 13 12/17/20 1626  SpO2 100 % 12/17/20 1626  Vitals shown include unvalidated device data.  Last Pain:  Vitals:   12/17/20 1440  TempSrc: Temporal  PainSc: 8       Patients Stated Pain Goal: 0 ([id number])  Complications: No notable events documented.

## 2020-12-17 NOTE — Progress Notes (Signed)
Initial Nutrition Assessment  DOCUMENTATION CODES:  Not applicable  INTERVENTION:  Recommend regular diet after surgery to maximize intake Ensure Enlive po BID, each supplement provides 350 kcal and 20 grams of protein MVI with minerals daily  NUTRITION DIAGNOSIS:  Increased nutrient needs related to hip fracture as evidenced by estimated needs.  GOAL:  Patient will meet greater than or equal to 90% of their needs  MONITOR:  PO intake, Diet advancement, Labs  REASON FOR ASSESSMENT:  Consult Hip fracture protocol  ASSESSMENT:  87 y.o. female with hx of HTN, hypothyroidism, PVD, osteopenia, and GERD presented to ED from rehab facility with increasing pain in the tailbone and left pelvic area. Pt had a fall ~ 1 month ago and is scheduled for surgical repair. However, reports pain is increasing and could not manage.  Orthopedics consulted and plans to take pt for lumbosacral vertebroplasty at the S1 and 2 levels today (9/14)  Pt resting in bed at the time of visit, family at bedside assist with hx. Pt reports that her appetite was normal prior to admission, eats 3 meals each day. Occasionally drinks ensure supplements. Has not noticed any changes in her weight either, weighed on bed scale and confirmed to be at 155 lb, which is pt's usual.   Inquired about hx of celiac's disease listed in chart. Pt does not follow a gluten free diet at home and does not experience any GI symptoms from this. Family reports rheumatologist recommended cutting gluten out to manage symptoms - no formal GI workup to dx. Pt denies every having severe cramping or bloody stools after gluten consumption - recommend a regular diet after surgery.  Nutritionally Relevant Medications: Scheduled Meds:  folic acid  1 mg Oral Daily   levothyroxine  75 mcg Oral Q0600   multivitamin with minerals  1 tablet Oral Daily   polyethylene glycol  17 g Oral Daily   predniSONE  2.5 mg Oral Daily   senna  1 tablet Oral Daily    PRN Meds: ondansetron, pantoprazole, senna-docusate  Labs Reviewed  NUTRITION - FOCUSED PHYSICAL EXAM: Mild muscle deficits noted, most consistent with normal aging. Flowsheet Row Most Recent Value  Orbital Region No depletion  Upper Arm Region No depletion  Thoracic and Lumbar Region No depletion  Buccal Region No depletion  Temple Region Mild depletion  Clavicle Bone Region No depletion  Clavicle and Acromion Bone Region Mild depletion  Scapular Bone Region No depletion  Dorsal Hand Mild depletion  Patellar Region No depletion  Anterior Thigh Region No depletion  Posterior Calf Region No depletion  Edema (RD Assessment) None  Hair Reviewed  Eyes Reviewed  Mouth Reviewed  Skin Reviewed  Nails Reviewed    Diet Order:   Diet Order             Diet NPO time specified  Diet effective midnight                  EDUCATION NEEDS:  No education needs have been identified at this time  Skin:  Skin Assessment: Reviewed RN Assessment (left foot, ecchymosis)  Last BM:  PTA  Height:  Ht Readings from Last 1 Encounters:  12/16/20 '5\' 7"'$  (1.702 m)   Weight:  Wt Readings from Last 1 Encounters:  12/17/20 70.3 kg    Ideal Body Weight:  61.4 kg  BMI:  Body mass index is 24.27 kg/m.  Estimated Nutritional Needs:  Kcal:  1600-1800 kcal/d Protein:  80-90g/d Fluid:  1.8-2L/d  Apolonio Schneiders  Yong Channel, RD, LDN Clinical Dietitian Pager on [place]

## 2020-12-18 ENCOUNTER — Encounter: Payer: Self-pay | Admitting: Orthopedic Surgery

## 2020-12-18 DIAGNOSIS — S3282XG Multiple fractures of pelvis without disruption of pelvic ring, subsequent encounter for fracture with delayed healing: Secondary | ICD-10-CM | POA: Diagnosis not present

## 2020-12-18 DIAGNOSIS — S329XXA Fracture of unspecified parts of lumbosacral spine and pelvis, initial encounter for closed fracture: Secondary | ICD-10-CM

## 2020-12-18 HISTORY — DX: Fracture of unspecified parts of lumbosacral spine and pelvis, initial encounter for closed fracture: S32.9XXA

## 2020-12-18 LAB — BASIC METABOLIC PANEL
Anion gap: 5 (ref 5–15)
BUN: 16 mg/dL (ref 8–23)
CO2: 28 mmol/L (ref 22–32)
Calcium: 8.4 mg/dL — ABNORMAL LOW (ref 8.9–10.3)
Chloride: 104 mmol/L (ref 98–111)
Creatinine, Ser: 0.55 mg/dL (ref 0.44–1.00)
GFR, Estimated: >60 mL/min (ref >60)
Glucose, Bld: 108 mg/dL — ABNORMAL HIGH (ref 70–99)
Potassium: 3.7 mmol/L (ref 3.5–5.1)
Sodium: 137 mmol/L (ref 135–145)

## 2020-12-18 LAB — CBC
HCT: 32.4 % — ABNORMAL LOW (ref 36.0–46.0)
Hemoglobin: 11 g/dL — ABNORMAL LOW (ref 12.0–15.0)
MCH: 32.3 pg (ref 26.0–34.0)
MCHC: 34 g/dL (ref 30.0–36.0)
MCV: 95 fL (ref 80.0–100.0)
Platelets: 198 10*3/uL (ref 150–400)
RBC: 3.41 MIL/uL — ABNORMAL LOW (ref 3.87–5.11)
RDW: 16.4 % — ABNORMAL HIGH (ref 11.5–15.5)
WBC: 11.3 10*3/uL — ABNORMAL HIGH (ref 4.0–10.5)
nRBC: 0 % (ref 0.0–0.2)

## 2020-12-18 NOTE — Progress Notes (Signed)
Physician Order  Patient needs outpatient physical and occupational therapy following sacroplasty. Please evaluate and treat.    Meloni Hinz B. Si Raider, MD

## 2020-12-18 NOTE — Discharge Summary (Signed)
Gloria [name] [id number] DOB: 05-Dec-1925 DOA: 12/16/2020  PCP: Adin Hector, MD  Admit date: 12/16/2020 Discharge date: 12/18/2020  Time spent: 35 minutes  Recommendations for Outpatient Follow-up:  Pcp f/u 1-2 weeks Orthopedics f/u as needed     Discharge Diagnoses:  Principal Problem:   Closed pelvic fracture ([place]) Active Problems:   Hypothyroidism   GERD   Rheumatoid arthritis ([place])   Polymyalgia rheumatica ([place])   Essential hypertension   HLD (hyperlipidemia)   Depression   Discharge Condition: stable  Diet recommendation: heart healthy  Filed Weights   12/16/20 0953 12/17/20 0908 12/17/20 1440  Weight: 70.3 kg 70.3 kg 70.3 kg    History of present illness:  Gloria [name] is a 87 y.o. female with medical history significant of hypertension, hyperlipidemia, GERD, hypothyroidism, depression, rheumatoid arthritis, PMR, aneurysm of ascending aorta (s/p of stent placement), celiac disease, PVD, anemia, who presents with severe pelvic pain.   Patient had a fall on 8/19. CT scan showed acute nondisplaced fractures of the left superior and inferior pubic rami and small acute nondisplaced fracture of the left anterior sacral ala. Dr. [name] has been trying to get insurance authorization for surgery. Pt states that her pelvic pain is so severe that she cannot tolerate anymore.  She cannot ambulate. The pain is mainly located in the right side of her pelvis.  No leg numbness or tingling.  She also complains of intermittent muscle spasm.  Patient does not have chest pain, cough, shortness of breath.  No fever or chills.  No nausea vomiting, diarrhea or abdominal pain.  No symptoms of UTI. She is in such pain today her daughter brought her to the emergency room.  Hospital Course:  Patient presented for pain control 2/2 recent sacral and pubic rami fractures. Underwent sacroplasty with orthopedics on 9/14. Tolerated procedure well and reported pain much improved pod1.  Evaluated by pt/ot who advised outpt PT/OT which is ordered. Can f/u with orthopedics as needed.   Procedures: Sacroplasty 9/14   Consultations: orthopedics  Discharge Exam: Vitals:   12/18/20 0426 12/18/20 0737  BP: (!) 155/70 (!) 105/93  Pulse: 66 66  Resp: 18 16  Temp: 97.7 F (36.5 C) 98.3 F (36.8 C)  SpO2: 98% 98%    General exam: Appears calm and comfortable  Respiratory system: Clear to auscultation. Respiratory effort normal. Cardiovascular system: S1 & S2 heard, RRR. Moderate holosystolic murmur Gastrointestinal system: Abdomen is nondistended, soft and nontender. No organomegaly or masses felt. Normal bowel sounds heard. Central nervous system: Alert and oriented. No focal neurological deficits. Extremities: able to move all 4 Skin: No rashes, lesions or ulcers Psychiatry: Judgement and insight appear normal. Mood is depressed  Discharge Instructions   Discharge Instructions     Diet - low sodium heart healthy   Complete by: As directed    Discharge wound care:   Complete by: As directed    Remove bandage tomorrow   Increase activity slowly   Complete by: As directed       Allergies as of 12/18/2020       Reactions   Amoxicillin Other (See Comments)   intolerant   Codeine Nausea Only   Ibandronate Sodium    Ibandronate Sodium  [ibandronic Acid] Other (See Comments)   Gi upset   Risedronate Other (See Comments)   Gi upset   Risedronate Sodium    Sulfa Antibiotics Hives        Medication List     STOP taking  these medications    aspirin 81 MG EC tablet   mirabegron ER 50 MG Tb24 tablet Commonly known as: MYRBETRIQ       TAKE these medications    acetaminophen 325 MG tablet Commonly known as: TYLENOL Take 2 tablets (650 mg total) by mouth every 4 (four) hours as needed for moderate pain.   amLODipine 5 MG tablet Commonly known as: NORVASC Take 1 tablet (5 mg total) by mouth daily.   carvedilol 6.25 MG tablet Commonly known  as: COREG Take 1 tablet (6.25 mg total) by mouth 2 (two) times daily with a meal. What changed: when to take this   folic acid 1 MG tablet Commonly known as: FOLVITE Take 1 mg by mouth daily.   levothyroxine 75 MCG tablet Commonly known as: SYNTHROID Take 75 mcg by mouth daily.   methotrexate 2.5 MG tablet Commonly known as: RHEUMATREX Take 15 mg by mouth every Friday.   multivitamin with minerals Tabs tablet Take 1 tablet by mouth daily.   oxyCODONE 5 MG immediate release tablet Commonly known as: Oxy IR/ROXICODONE Take 5 mg by mouth every 4 (four) hours as needed for pain.   predniSONE 5 MG tablet Commonly known as: DELTASONE Take 2.5 mg by mouth daily.               Discharge Care Instructions  (From admission, onward)           Start     Ordered   12/18/20 0000  Discharge wound care:       Comments: Remove bandage tomorrow   12/18/20 1317           Allergies  Allergen Reactions   Amoxicillin Other (See Comments)    intolerant   Codeine Nausea Only   Ibandronate Sodium    Ibandronate Sodium  [Ibandronic Acid] Other (See Comments)    Gi upset   Risedronate Other (See Comments)    Gi upset   Risedronate Sodium    Sulfa Antibiotics Hives    Follow-up Information     Hessie Knows, MD .   Specialty: Orthopedic Surgery Why: As needed Contact information: [address] [place] [address] [phone]                  The results of significant diagnostics from this hospitalization (including imaging, microbiology, ancillary and laboratory) are listed below for reference.    Significant Diagnostic Studies: DG Lumbar Spine 2-3 Views  Result Date: 12/17/2020 CLINICAL DATA:  Surgery, elected.  Sacroplasty. EXAM: LUMBAR SPINE - 2-3 VIEW; DG C-ARM 1-60 MIN COMPARISON:  CT pelvis 12/16/2020. FINDINGS: An intraprocedural AP view fluoroscopic image of the sacrum, and a lateral view intraprocedural  fluoroscopic cine clip of the sacrum, are submitted. On the provided images, there are findings of interval bilateral sacroplasty. No unexpected finding on the provided views. Correlate with procedural history. IMPRESSION: Intraprocedural fluoroscopic image and intraprocedural fluoroscopic cine clip from bilateral sacroplasty. Correlate with the procedural history. Electronically Signed   By: Kellie Simmering D.O.   On: 12/17/2020 17:13   CT HEAD WO CONTRAST (5MM)  Result Date: 11/21/2020 CLINICAL DATA:  Neck trauma.  Unwitnessed fall. EXAM: CT HEAD WITHOUT CONTRAST CT CERVICAL SPINE WITHOUT CONTRAST TECHNIQUE: Multidetector CT imaging of the head and cervical spine was performed following the standard protocol without intravenous contrast. Multiplanar CT image reconstructions of the cervical spine were also generated. COMPARISON:  11/30/2019 FINDINGS: CT HEAD FINDINGS Brain: No evidence of acute  infarction, hemorrhage, hydrocephalus, extra-axial collection or mass lesion/mass effect. 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 Vascular: No hyperdense vessel or unexpected calcification. Skull: Normal. Negative for fracture or focal lesion. Sinuses/Orbits: No evidence of injury CT CERVICAL SPINE FINDINGS Alignment: No traumatic malalignment Skull base and vertebrae: No acute fracture. No primary bone lesion or focal pathologic process. Soft tissues and spinal canal: No prevertebral fluid or swelling. No visible canal hematoma. Disc levels:  Ordinary degenerative changes. Upper chest: No evidence of injury IMPRESSION: No evidence of acute intracranial or cervical spine injury. Electronically Signed   By: Monte Fantasia M.D.   On: 11/21/2020 07:24   CT Cervical Spine Wo Contrast  Result Date: 11/21/2020 CLINICAL DATA:  Neck trauma.  Unwitnessed fall. EXAM: CT HEAD WITHOUT CONTRAST CT CERVICAL SPINE WITHOUT CONTRAST TECHNIQUE: Multidetector CT imaging of the head and  cervical spine was performed following the standard protocol without intravenous contrast. Multiplanar CT image reconstructions of the cervical spine were also generated. COMPARISON:  11/30/2019 FINDINGS: CT HEAD FINDINGS Brain: No evidence of acute infarction, hemorrhage, hydrocephalus, extra-axial collection or mass lesion/mass effect. 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 Vascular: No hyperdense vessel or unexpected calcification. Skull: Normal. Negative for fracture or focal lesion. Sinuses/Orbits: No evidence of injury CT CERVICAL SPINE FINDINGS Alignment: No traumatic malalignment Skull base and vertebrae: No acute fracture. No primary bone lesion or focal pathologic process. Soft tissues and spinal canal: No prevertebral fluid or swelling. No visible canal hematoma. Disc levels:  Ordinary degenerative changes. Upper chest: No evidence of injury IMPRESSION: No evidence of acute intracranial or cervical spine injury. Electronically Signed   By: Monte Fantasia M.D.   On: 11/21/2020 07:24   CT PELVIS WO CONTRAST  Result Date: 12/16/2020 CLINICAL DATA:  Pelvic fractures with increasing pain. EXAM: CT PELVIS WITHOUT CONTRAST TECHNIQUE: Multidetector CT imaging of the pelvis was performed following the standard protocol without intravenous contrast. COMPARISON:  11/21/2020 FINDINGS: Urinary Tract:  No abnormality visualized. Bowel: Sigmoid diverticulosis. No active diverticulitis. Pelvic small bowel decompressed. Vascular/Lymphatic: Aorta iliac atherosclerosis.  No adenopathy Reproductive:  Prior hysterectomy.  No adnexal masses. Other:  No free fluid or free air. Musculoskeletal: Progressive fractures noted through the left sacral ala with fractures more apparent. No displacement. Increase conspicuity of the fracture may be related to bone resorption around the fracture. Fractures through the left superior and inferior pubic rami also better visualized, likely  related to bone resorption. Some increased displacement of the superior pubic ramus fracture. Previously-seen extraperitoneal hematoma adjacent to the left superior pubic ramus has decreased since prior study. Prior right hip replacement. IMPRESSION: Increase conspicuity of the left sacral ala fractures and the left superior/inferior pubic rami fractures, likely related to bone resorption. There is slight increased displacement of the left superior pubic ramus fracture. Electronically Signed   By: Rolm Baptise M.D.   On: 12/16/2020 12:34   CT PELVIS WO CONTRAST  Result Date: 11/21/2020 CLINICAL DATA:  Left hip pain after fall. EXAM: CT PELVIS WITHOUT CONTRAST TECHNIQUE: Multidetector CT imaging of the pelvis was performed following the standard protocol without intravenous contrast. COMPARISON:  Left hip x-rays from same day. CTA abdomen and pelvis dated May 10, 2019. FINDINGS: Urinary Tract:  No abnormality visualized. Bowel:  Few sigmoid colonic diverticula.  Otherwise unremarkable. Vascular/Lymphatic: Aortoiliac atherosclerotic vascular disease. No enlarged pelvic lymph nodes. Reproductive:  Prior hysterectomy.  No adnexal mass. Other: Small volume extraperitoneal hemorrhage  in the anterior pelvis. Musculoskeletal: Small acute nondisplaced fracture of the left anterior sacral ala (series 2, image 38). Acute nondisplaced fractures of the left superior and inferior pubic rami (series 2, images 94 and 111). No femur fracture. No dislocation. Right hip hemiarthroplasty. No evidence of hardware failure or loosening. IMPRESSION: 1. Acute nondisplaced fractures of the left superior and inferior pubic rami with small volume extraperitoneal hemorrhage in the anterior pelvis. 2. Small acute nondisplaced fracture of the left anterior sacral ala. 3. Aortic Atherosclerosis (ICD10-I70.0). Electronically Signed   By: Titus Dubin M.D.   On: 11/21/2020 10:32   DG Knee Complete 4 Views Left  Result Date:  11/21/2020 CLINICAL DATA:  Fall. EXAM: LEFT KNEE - COMPLETE 4+ VIEW COMPARISON:  None. FINDINGS: Tricompartmental degenerative spurring with mild medial compartment narrowing. No evidence of fracture or joint effusion. Generalized osteopenia IMPRESSION: No acute finding. Osteopenia and osteoarthritis. Electronically Signed   By: Monte Fantasia M.D.   On: 11/21/2020 07:57   DG Foot Complete Left  Result Date: 11/21/2020 CLINICAL DATA:  Fall today with left foot pain and dorsal foot bruising. EXAM: LEFT FOOT - COMPLETE 3+ VIEW COMPARISON:  05/14/2020 FINDINGS: There is no evidence of fracture or dislocation. Dorsal soft tissue swelling. Tiny heel spur. IMPRESSION: Soft tissue swelling without fracture. Electronically Signed   By: Monte Fantasia M.D.   On: 11/21/2020 07:58   DG C-Arm 1-60 Min  Result Date: 12/17/2020 CLINICAL DATA:  Surgery, elected.  Sacroplasty. EXAM: LUMBAR SPINE - 2-3 VIEW; DG C-ARM 1-60 MIN COMPARISON:  CT pelvis 12/16/2020. FINDINGS: An intraprocedural AP view fluoroscopic image of the sacrum, and a lateral view intraprocedural fluoroscopic cine clip of the sacrum, are submitted. On the provided images, there are findings of interval bilateral sacroplasty. No unexpected finding on the provided views. Correlate with procedural history. IMPRESSION: Intraprocedural fluoroscopic image and intraprocedural fluoroscopic cine clip from bilateral sacroplasty. Correlate with the procedural history. Electronically Signed   By: Kellie Simmering D.O.   On: 12/17/2020 17:13   DG HIP UNILAT W OR W/O PELVIS 2-3 VIEWS LEFT  Result Date: 11/21/2020 CLINICAL DATA:  Fall today with left hip pain EXAM: DG HIP (WITH OR WITHOUT PELVIS) 2-3V LEFT COMPARISON:  None. FINDINGS: Seated right hip arthroplasty which is unremarkable where covered. No evidence of left hip or pelvic ring fracture. Osteopenia. IMPRESSION: No acute finding. Electronically Signed   By: Monte Fantasia M.D.   On: 11/21/2020 07:55     Microbiology: Recent Results (from the past 240 hour(s))  SARS CORONAVIRUS 2 (TAT 6-24 HRS) Nasopharyngeal Nasopharyngeal Swab     Status: None   Collection Time: 12/16/20  2:56 PM   Specimen: Nasopharyngeal Swab  Result Value Ref Range Status   SARS Coronavirus 2 NEGATIVE NEGATIVE Final    Comment: (NOTE) SARS-CoV-2 target nucleic acids are NOT DETECTED.  The SARS-CoV-2 RNA is generally detectable in upper and lower respiratory specimens during the acute phase of infection. Negative results do not preclude SARS-CoV-2 infection, do not rule out co-infections with other pathogens, and should not be used as the sole basis for treatment or other patient management decisions. Negative results must be combined with clinical observations, patient history, and epidemiological information. The expected result is Negative.  Fact Sheet for Patients: SugarRoll.be  Fact Sheet for Healthcare Providers: https://www.woods-mathews.com/  This test is not yet approved or cleared by the [place] FDA and  has been authorized for detection and/or diagnosis of SARS-CoV-2 by FDA under an Emergency Use Authorization (  EUA). This EUA will remain  in effect (meaning this test can be used) for the duration of the COVID-19 declaration under Se ction 564(b)(1) of the Act, 21 U.S.C. section 360bbb-3(b)(1), unless the authorization is terminated or revoked sooner.  Performed at [facility], [place] [address] [place], [place] [address]   Surgical pcr screen     Status: None   Collection Time: 12/17/20 12:52 PM   Specimen: Nasal Mucosa; Nasal Swab  Result Value Ref Range Status   MRSA, PCR NEGATIVE NEGATIVE Final   Staphylococcus aureus NEGATIVE NEGATIVE Final    Comment: (NOTE) The Xpert SA Assay (FDA approved for NASAL specimens in patients 66 years of age and older), is one component of a comprehensive surveillance program. It is not intended to  diagnose infection nor to guide or monitor treatment. Performed at [facility], [address] [place], [place] [address]      Labs: Basic Metabolic Panel: Recent Labs  Lab 12/16/20 1512 12/17/20 1755 12/18/20 0337  NA 137  --  137  K 3.7  --  3.7  CL 102  --  104  CO2 24  --  28  GLUCOSE 101*  --  108*  BUN 10  --  16  CREATININE 0.43* 0.62 0.55  CALCIUM 8.7*  --  8.4*   Liver Function Tests: No results for input(s): AST, ALT, ALKPHOS, BILITOT, PROT, ALBUMIN in the last 168 hours. No results for input(s): LIPASE, AMYLASE in the last 168 hours. No results for input(s): AMMONIA in the last 168 hours. CBC: Recent Labs  Lab 12/16/20 1512 12/17/20 1755 12/18/20 0337  WBC 10.1 13.0* 11.3*  NEUTROABS 6.9  --   --   HGB 12.6 11.6* 11.0*  HCT 37.8 35.5* 32.4*  MCV 95.7 95.7 95.0  PLT 210 229 198   Cardiac Enzymes: No results for input(s): CKTOTAL, CKMB, CKMBINDEX, TROPONINI in the last 168 hours. BNP: BNP (last 3 results) No results for input(s): BNP in the last 8760 hours.  ProBNP (last 3 results) No results for input(s): PROBNP in the last 8760 hours.  CBG: No results for input(s): GLUCAP in the last 168 hours.     Signed:  Desma Maxim MD.  [facility] 12/18/2020, 1:19 PM

## 2020-12-18 NOTE — Anesthesia Postprocedure Evaluation (Signed)
Anesthesia Post Note  Patient: Gloria [name]  Procedure(s) Performed: SACROPLASTY (Buttocks)  Patient location during evaluation: PACU Anesthesia Type: General Level of consciousness: awake and alert Pain management: pain level controlled Vital Signs Assessment: post-procedure vital signs reviewed and stable Respiratory status: spontaneous breathing, nonlabored ventilation, respiratory function stable and patient connected to nasal cannula oxygen Cardiovascular status: blood pressure returned to baseline and stable Postop Assessment: no apparent nausea or vomiting Anesthetic complications: no   No notable events documented.   Last Vitals:  Vitals:   12/17/20 2030 12/18/20 0426  BP: 135/60 (!) 155/70  Pulse: 67 66  Resp: 16 18  Temp: 36.8 C 36.5 C  SpO2: 97% 98%    Last Pain:  Vitals:   12/18/20 0426  TempSrc: Oral  PainSc:                  Molli Barrows

## 2020-12-18 NOTE — TOC Progression Note (Signed)
Transition of Care [facility]) - Progression Note    Patient Details  Name: Gloria [name] MRN: [id number] Date of Birth: [birth year]  Transition of Care [facility]) CM/SW [place], RN Phone Number: 12/18/2020, 2:15 PM  Clinical Narrative:      Met with the patient and her family in the room, Completed the Code 30, the patient was provided with a signed copy, She has a RW and a 3 in 1 at home, she will be doing [facility] PT at [place] living tomorrow, I faxed the order to [phone], She has transportation and has no other needs      Expected Discharge Plan and Services           Expected Discharge Date: 12/18/20                                     Social Determinants of Health (SDOH) Interventions    Readmission Risk Interventions No flowsheet data found.

## 2020-12-18 NOTE — Care Management CC44 (Signed)
Condition Code 44 Documentation Completed  Patient Details  Name: Gloria [name] MRN: [id number] Date of Birth: 09/12/1925   Condition Code 44 given:  Yes Patient signature on Condition Code 44 notice:  Yes Documentation of 2 MD's agreement:  Yes Code 44 added to claim:  Yes    Su Hilt, RN 12/18/2020, 2:12 PM

## 2020-12-18 NOTE — Evaluation (Signed)
Physical Therapy Evaluation Patient Details Name: Gloria [name] MRN: [id number] DOB: 1925-05-15 Today's Date: 12/18/2020  History of Present Illness  Pt is a 87 y/o F with PMH: polymyalgia rheumatica (on prednisone), HTN, HLD, thoacic aortic aneurysm and previous hospital [place] d/[place] fall [place] [facility]  At that time, CT of the pelvis showed acute nondisplaced fractures of the left superior and inferior pubic rami with extraperitoneal hemorrhage in the anterior pelvis. She was scheduled for sx later this month (12/2020). Pt presented with increasing tail bone pain on this admission and underwent sacroplasty on 9/14 [name]).   Clinical Impression  Pt admitted with above diagnosis. Pt received supine in bed with OT to attempt Co-eval in anticipation of pain limiting mobility/unable to tolerate 2 sessions. Pt able to report PLOF, home lay out, DME, without difficulty. Pt is in indep living at [facility] prior to acute admission and receiving PT at [facility] in her home. Indep with ADL's/IADL's with use of RW. Pt with ease able to transfer from supine to EOB with supervision and HOB elevated with use of bed rails. Able to stand with minA+1 to RW with VC's. Due to lack of need of co-eval, OT leaving room for PT to assess ambulation. With supervision pt amb ~120' with RW with good step through pattern with heavy reliance on Ue's to off load LE's from pubic rami fracture discomfort. Slow but stable pace noted with good sequencing with turns returning to room. Pt educated seated EOB of scap retractions to begin strengthening postural musculature d/t noted S1-S2 vertebroplasty from compression fractures. Performed 10 reps with good understanding. Displayed ability to place contralateral LE on knee to assess hip mobility bilaterally with minA use from hands. Pt requesting to use bathroom standing to RW with supervision ambulating to bathroom to elevated toilet seat. Care transferred to OT while  pt on commode with pt's daughter present in room. Pt would benefit from [facility] PT to transition pt back to PLOF due to current deficits in strength and mobility and to strengthen core/postural muscles d/t having vertebral kyphoplasty. Pt currently with functional limitations due to the deficits listed below (see PT Problem List). Pt will benefit from skilled PT to increase their independence and safety with mobility to allow discharge to the venue listed below.      Recommendations for follow up therapy are one component of a multi-disciplinary discharge planning process, led by the attending physician.  Recommendations may be updated based on patient status, additional functional criteria and insurance authorization.  Follow Up Recommendations Home health PT    Equipment Recommendations  None recommended by PT    Recommendations for Other Services       Precautions / Restrictions Precautions Precautions: Fall Restrictions Weight Bearing Restrictions: Yes RLE Weight Bearing: Weight bearing as tolerated LLE Weight Bearing: Weight bearing as tolerated      Mobility  Bed Mobility Overal bed mobility: Needs Assistance Bed Mobility: Supine to Sit;Sit to Supine     Supine to sit: Supervision;HOB elevated Sit to supine: Supervision   General bed mobility comments: Pt left in OT care on elevated toilet seat in bathroom. Patient Response: Cooperative  Transfers Overall transfer level: Needs assistance Equipment used: Rolling walker (2 wheeled) Transfers: Sit to/from Stand Sit to Stand: Min guard;Supervision         General transfer comment: MinA for STS with VC's for safe hand placement on RW. Progressed to supervision with mobility in hallway.  Ambulation/Gait Ambulation/Gait assistance:  Supervision Gait Distance (Feet): 120 Feet Assistive device: Rolling walker (2 wheeled) Gait Pattern/deviations: Step-through pattern;Antalgic     General Gait Details: Reliance/UE support  needed on RW to support LE's during gait. Safe sequencing and is stable throughout.  Stairs            Wheelchair Mobility    Modified Rankin (Stroke Patients Only)       Balance Overall balance assessment: Mild deficits observed, not formally tested Sitting-balance support: Feet supported Sitting balance-Leahy Scale: Good       Standing balance-Leahy Scale: Fair Standing balance comment: BUE support on RW with standing and walking.                             Pertinent Vitals/Pain Pain Assessment: No/denies pain Pain Score: 0-No pain Faces Pain Scale: No hurt Pain Location: After eceiving pain med pt denies pain Pain Intervention(s): Limited activity within patient's tolerance;Monitored during session    [name] expects to be discharged to:: Other (Comment) ([facility] independent living) Living Arrangements: Alone Available Help at Discharge: Family;Available PRN/intermittently;Other (Comment) (son helps with transportation) Type of Home: Independent living facility Home Access: Level entry     Home Layout: One level Home Equipment: Walker - 2 wheels;Bedside commode;Shower seat      Prior Function Level of Independence: Independent with assistive device(s)         Comments: + driving short distances to appts, her son assists with getting groceries, uses 2WW for fxl mobility, seen by HHPT since the fall. She is otherwise performs all ADLs/IADLs I'ly.     Hand Dominance   Dominant Hand: Right    Extremity/Trunk Assessment   Upper Extremity Assessment Upper Extremity Assessment: Overall WFL for tasks assessed    Lower Extremity Assessment Lower Extremity Assessment: Generalized weakness    Cervical / Trunk Assessment Cervical / Trunk Assessment: Normal  Communication   Communication: No difficulties  Cognition Arousal/Alertness: Awake/alert Behavior During Therapy: WFL for tasks assessed/performed Overall  Cognitive Status: Within Functional Limits for tasks assessed                                 General Comments: Pt appropriate with command following, oriented to self, place and situation, but not all aspects of time (reasonable for hospitalization). Somewhat anxious/labile initially regarding not wanting to stay in the hospital another night.      General Comments      Exercises Other Exercises Other Exercises: Role of acute care PT, d/c recs   Assessment/Plan    PT Assessment Patient needs continued PT services  PT Problem List Decreased strength;Decreased mobility;Decreased balance;Decreased safety awareness       PT Treatment Interventions DME instruction;Therapeutic exercise;Gait training;Balance training;Neuromuscular re-education;Functional mobility training;Therapeutic activities;Patient/family education    PT Goals (Current goals can be found in the Care Plan section)  Acute Rehab PT Goals Patient Stated Goal: to go home PT Goal Formulation: With patient Time For Goal Achievement: 01/01/21 Potential to Achieve Goals: Good    Frequency Min 2X/week   Barriers to discharge        Co-evaluation   Reason for Co-Treatment: For patient/therapist safety;To address functional/ADL transfers PT goals addressed during session: Mobility/safety with mobility;Proper use of DME OT goals addressed during session: ADL's and self-care       AM-PAC PT "6 Clicks" Mobility  Outcome Measure Help needed turning from  your back to your side while in a flat bed without using bedrails?: A Little Help needed moving from lying on your back to sitting on the side of a flat bed without using bedrails?: A Little Help needed moving to and from a bed to a chair (including a wheelchair)?: A Little Help needed standing up from a chair using your arms (e.g., wheelchair or bedside chair)?: A Little Help needed to walk in hospital room?: A Little Help needed climbing 3-5 steps with  a railing? : A Lot 6 Click Score: 17    End of Session Equipment Utilized During Treatment: Gait belt   Patient left: with family/visitor present (on elevated toilet seat in care of OT) Nurse Communication: Mobility status PT Visit Diagnosis: Muscle weakness (generalized) (M62.81);Other abnormalities of gait and mobility (R26.89)    Time: [id number] PT Time Calculation (min) (ACUTE ONLY): 30 min   Charges:   PT Evaluation $PT Eval Low Complexity: 1 Low PT Treatments $Gait Training: 8-22 mins        Salem Caster. Fairly IV, PT, DPT Physical Therapist- [facility]  12/18/2020, 11:32 AM

## 2020-12-18 NOTE — Evaluation (Signed)
Occupational Therapy Evaluation Patient Details Name: Gloria [name] MRN: [id number] DOB: 09/26/1925 Today's Date: 12/18/2020   History of Present Illness Pt is a 87 y/o F with PMH: polymyalgia rheumatica (on prednisone), HTN, HLD, thoacic aortic aneurysm and previous hospital [place] fall [place] [facility]  At that time, CT of the pelvis showed acute nondisplaced fractures of the left superior and inferior pubic rami with extraperitoneal hemorrhage in the anterior pelvis. She was scheduled for sx later this month (12/2020). Pt presented with increasing tail bone pain on this admission and underwent sacroplasty on 9/14 [name]).   Clinical Impression   Pt seen for OT evaluation this date in setting of acute hospitalization s/p sacroplasty. Pt reports being MOD I with 2WW for fxl mobility at baseline and living alone [place] at [facility]. Pt's daughter reports that her and her brother have been alternating staying with patient since she had fall last month. Pt presents this date with decreased report of pain (pre-medicated before session). On OT assessment this date, pt requires: SETUP for seated UB ADLs. MIN A for seated LB ADLs (uses reacher at home at baseline, but is somewhat more pain limimted today). Requires MIN A with progress to CGA for ADL transfers with RW. With family support for supervision and HHOT f/u, anticipate that [place] can return to [place] environment upon d/c from acute setting.      Recommendations for follow up therapy are one component of a multi-disciplinary discharge planning process, led by the attending physician.  Recommendations may be updated based on patient status, additional functional criteria and insurance authorization.   Follow Up Recommendations  Home health OT;Supervision - Intermittent    Equipment Recommendations  Tub/shower seat    Recommendations for Other Services       Precautions / Restrictions Precautions Precautions:  Fall Restrictions Weight Bearing Restrictions: Yes RLE Weight Bearing: Weight bearing as tolerated LLE Weight Bearing: Weight bearing as tolerated      Mobility Bed Mobility Overal bed mobility: Needs Assistance Bed Mobility: Supine to Sit;Sit to Supine     Supine to sit: Supervision;HOB elevated Sit to supine: Supervision;HOB elevated   General bed mobility comments: increased time, cues to sequence    Transfers Overall transfer level: Needs assistance Equipment used: Rolling walker (2 wheeled) Transfers: Sit to/from Stand Sit to Stand: Min guard         General transfer comment: MIN A for initial STS, cues for hand placement/safe use of 2WW. Pt progresses to CGA/SUPV on all subsequent transfers including commode transfer with use of BSC to elevate and grab bars.    Balance Overall balance assessment: Mild deficits observed, not formally tested Sitting-balance support: Feet supported Sitting balance-Leahy Scale: Good       Standing balance-Leahy Scale: Fair Standing balance comment: B UE support for fxl mobility, able to alternate hands from RW support for static standing ADL tasks.                           ADL either performed or assessed with clinical judgement   ADL                                         General ADL Comments: requires SETUP for seated UB ADLs. MIN A for seated LB ADLs (uses reacher at home at baseline, but  is somewhat more pain limimted today). Requires MIN A with progress to CGA for ADL transfers with RW.     Vision Baseline Vision/History: 1 Wears glasses Patient Visual Report: No change from baseline       Perception     Praxis      Pertinent Vitals/Pain Pain Assessment: Faces Faces Pain Scale: No hurt Pain Location: had pain on initial check in pelvic region, pt requested meds and RN administered before OT session. Pt with no c/o pain during session.     Hand Dominance Right   Extremity/Trunk  Assessment Upper Extremity Assessment Upper Extremity Assessment: Overall WFL for tasks assessed   Lower Extremity Assessment Lower Extremity Assessment: Defer to PT evaluation;Generalized weakness       Communication Communication Communication: No difficulties   Cognition Arousal/Alertness: Awake/alert Behavior During Therapy: WFL for tasks assessed/performed;Anxious Overall Cognitive Status: Within Functional Limits for tasks assessed                                 General Comments: Pt appropriate with command following, oriented to self, place and situation, but not all aspects of time (reasonable for hospitalization). Somewhat anxious/labile initially regarding not wanting to stay in the hospital another night.   General Comments       Exercises Other Exercises Other Exercises: OT educates pt re: role of OT in acute setting, safety recommendations including hand placement wiht commode transfers and use of RW for STS. Pt with moderate reception, will require ongoing education.   Shoulder Instructions      Home Living Family/patient expects to be discharged to:: Other (Comment) [facility] [place]) Living Arrangements: Alone Available Help at Discharge: Family;Available PRN/intermittently (son assists with transportation) Type of Home: Independent living facility Home Access: Level entry     Home Layout: One level     Bathroom Shower/Tub: Walk-in shower         Home Equipment: Environmental consultant - 2 wheels;Bedside commode;Shower seat          Prior Functioning/Environment Level of Independence: Independent with assistive device(s)        Comments: + driving short distances to appts, her son assists with getting groceries, uses 2WW for fxl mobility, seen by HHPT since the fall. She is otherwise performs all ADLs/IADLs I'ly.        OT Problem List: Decreased strength;Decreased activity tolerance;Impaired balance (sitting and/or standing);Pain      OT  Treatment/Interventions: Self-care/ADL training;Therapeutic exercise;DME and/or AE instruction;Therapeutic activities;Balance training    OT Goals(Current goals can be found in the care plan section) Acute Rehab OT Goals Patient Stated Goal: to go home OT Goal Formulation: With patient/family Time For Goal Achievement: 01/01/21 Potential to Achieve Goals: Good  OT Frequency: Min 2X/week   Barriers to D/C: Decreased caregiver support  pt's children are alternating staying with her since she fell and is recovering, but pt [place] otherwise living [place].       Co-evaluation PT/OT/SLP Co-Evaluation/Treatment: Yes Reason for Co-Treatment: For patient/therapist safety;To address functional/ADL transfers PT goals addressed during session: Mobility/safety with mobility OT goals addressed during session: ADL's and self-care      AM-PAC OT "6 Clicks" Daily Activity     Outcome Measure Help from another person eating meals?: None Help from another person taking care of personal grooming?: None Help from another person toileting, which includes using toliet, bedpan, or urinal?: A Little Help from another person bathing (including  washing, rinsing, drying)?: A Little Help from another person to put on and taking off regular upper body clothing?: None Help from another person to put on and taking off regular lower body clothing?: A Little 6 Click Score: 21   End of Session Equipment Utilized During Treatment: Gait belt;Rolling walker Nurse Communication: Mobility status  Activity Tolerance: Patient tolerated treatment well Patient left: in bed;with call bell/phone within reach;with bed alarm set;Other (comment);with family/visitor present (chair posiiton)  OT Visit Diagnosis: Unsteadiness on feet (R26.81);History of falling (Z91.81);Muscle weakness (generalized) (M62.81)                Time: 0940-1001 OT Time Calculation (min): 21 min Charges:  OT General Charges $OT Visit: 1 Visit OT  Evaluation $OT Eval Moderate Complexity: 1 Mod OT Treatments $Self Care/Home Management : 8-22 mins  Gerrianne Scale, MS, OTR/L ascom [phone] 12/18/20, 2:25 PM

## 2020-12-19 LAB — URINE CULTURE: Culture: NO GROWTH

## 2020-12-23 ENCOUNTER — Ambulatory Visit: Admission: RE | Admit: 2020-12-23 | Payer: Medicare PPO | Source: Home / Self Care | Admitting: Orthopedic Surgery

## 2020-12-23 ENCOUNTER — Encounter: Admission: RE | Payer: Self-pay | Source: Home / Self Care

## 2020-12-23 SURGERY — SACROPLASTY
Anesthesia: Choice

## 2021-04-01 ENCOUNTER — Ambulatory Visit: Payer: Medicare PPO | Admitting: Podiatry

## 2021-04-13 ENCOUNTER — Other Ambulatory Visit: Payer: Self-pay

## 2021-04-13 ENCOUNTER — Encounter: Payer: Self-pay | Admitting: Podiatry

## 2021-04-13 ENCOUNTER — Ambulatory Visit: Payer: Medicare PPO | Admitting: Podiatry

## 2021-04-13 DIAGNOSIS — B351 Tinea unguium: Secondary | ICD-10-CM | POA: Diagnosis not present

## 2021-04-13 DIAGNOSIS — L84 Corns and callosities: Secondary | ICD-10-CM

## 2021-04-13 DIAGNOSIS — M79675 Pain in left toe(s): Secondary | ICD-10-CM

## 2021-04-13 DIAGNOSIS — M79674 Pain in right toe(s): Secondary | ICD-10-CM

## 2021-04-13 NOTE — Progress Notes (Signed)
°  Subjective:  Patient ID: Gloria [name], female    DOB: 10-07-1925,  MRN: [id number]  Chief Complaint  Patient presents with   Nail Problem   Callouses    Nail and callus trim, RFC    88 y.o. female returns with the above complaint. History confirmed with patient.   Objective:  Physical Exam: warm, good capillary refill, no trophic changes or ulcerative lesions, normal DP and PT pulses and normal sensory exam. Onychomycosis of b/l hallux nails. Calluses painful to palpation left lateral submetatarsal 5.      Assessment:   1. Pain due to onychomycosis of toenails of both feet   2. Callus of foot      Plan:  Patient was evaluated and treated and all questions answered.  Discussed the etiology and treatment options for the condition in detail with the patient. Educated patient on the topical and oral treatment options for mycotic nails. Recommended debridement of the nails today. Sharp and mechanical debridement performed of all painful and mycotic nails today. Nails debrided in length and thickness using a nail nipper and a mechanical burr to level of comfort. Discussed treatment options including appropriate shoe gear. Follow up as needed for painful nails.  All symptomatic hyperkeratoses were safely debrided with a sterile #15 blade to patient's level of comfort without incident. We discussed preventative and palliative care of these lesions including supportive and accommodative shoegear, padding, prefabricated and custom molded accommodative orthoses, use of a pumice stone and lotions/creams daily.     Return in about 3 months (around 07/12/2021) for painful fungal nails .

## 2021-05-06 ENCOUNTER — Ambulatory Visit: Payer: Medicare PPO | Admitting: Dermatology

## 2021-05-06 ENCOUNTER — Other Ambulatory Visit: Payer: Self-pay

## 2021-05-06 DIAGNOSIS — D692 Other nonthrombocytopenic purpura: Secondary | ICD-10-CM

## 2021-05-06 DIAGNOSIS — C44629 Squamous cell carcinoma of skin of left upper limb, including shoulder: Secondary | ICD-10-CM

## 2021-05-06 DIAGNOSIS — C4492 Squamous cell carcinoma of skin, unspecified: Secondary | ICD-10-CM

## 2021-05-06 DIAGNOSIS — D492 Neoplasm of unspecified behavior of bone, soft tissue, and skin: Secondary | ICD-10-CM

## 2021-05-06 DIAGNOSIS — L578 Other skin changes due to chronic exposure to nonionizing radiation: Secondary | ICD-10-CM

## 2021-05-06 HISTORY — DX: Squamous cell carcinoma of skin, unspecified: C44.92

## 2021-05-06 NOTE — Progress Notes (Addendum)
Follow-Up Visit   Subjective  Gloria [name] is a 88 y.o. female who presents for the following: growth (L wrist, ~2wks, tender, ) and check spot (L jaw, pt just noticed). The patient has spots, moles and lesions to be evaluated, some may be new or changing and the patient has concerns that these could be cancer.  Patient accompanied by son who contributes to history.  The following portions of the chart were reviewed this encounter and updated as appropriate:   Tobacco   Allergies   Meds   Problems   Med Hx   Surg Hx   Fam Hx      Review of Systems:  No other skin or systemic complaints except as noted in HPI or Assessment and Plan.  Objective  Well appearing patient in no apparent distress; mood and affect are within normal limits.  A focused examination was performed including left hand/wrist, face. Relevant physical exam findings are noted in the Assessment and Plan.  L hand Hyperkeratotic pap 1.6cm        Assessment & Plan   Purpura - Chronic; persistent and recurrent.  Treatable, but not curable. - Violaceous macules and patches - Benign - Related to trauma, age, sun damage and/or use of blood thinners, chronic use of topical and/or oral steroids - Observe - Can use OTC arnica containing moisturizer such as Dermend Bruise Formula if desired - Call for worsening or other concerns - L jaw, arms  Neoplasm of skin L hand  Epidermal / dermal shaving  Lesion diameter (cm):  1.6 Informed consent: discussed and consent obtained   Timeout: patient name, date of birth, surgical site, and procedure verified   Procedure prep:  Patient was prepped and draped in usual sterile fashion Prep type:  Isopropyl alcohol Anesthesia: the lesion was anesthetized in a standard fashion   Anesthetic:  1% lidocaine w/ epinephrine 1-100,000 buffered w/ 8.4% NaHCO3 Instrument used: flexible razor blade   Hemostasis achieved with: pressure, aluminum chloride and electrodesiccation    Outcome: patient tolerated procedure well   Post-procedure details: sterile dressing applied and wound care instructions given   Dressing type: bacitracin and pressure dressing    Destruction of lesion Complexity: extensive   Destruction method: electrodesiccation and curettage   Informed consent: discussed and consent obtained   Timeout:  patient name, date of birth, surgical site, and procedure verified Procedure prep:  Patient was prepped and draped in usual sterile fashion Prep type:  Isopropyl alcohol Anesthesia: the lesion was anesthetized in a standard fashion   Anesthetic:  1% lidocaine w/ epinephrine 1-100,000 buffered w/ 8.4% NaHCO3 Curettage performed in three different directions: Yes   Electrodesiccation performed over the curetted area: Yes   Lesion length (cm):  1.6 Lesion width (cm):  1.6 Margin per side (cm):  0.2 Final wound size (cm):  2 Hemostasis achieved with:  pressure, aluminum chloride and electrodesiccation Outcome: patient tolerated procedure well with no complications   Post-procedure details: sterile dressing applied and wound care instructions given   Dressing type: bacitracin and pressure dressing    Specimen 1 - Surgical pathology Differential Diagnosis: D48.5 R/O SCC  Check Margins: No Hyperkeratotic pap 1.6cm EDC  SCC (squamous cell carcinoma), hand, left  Actinic Damage - chronic, secondary to cumulative UV radiation exposure/sun exposure over time - diffuse scaly erythematous macules with underlying dyspigmentation - Recommend daily broad spectrum sunscreen SPF 30+ to sun-exposed areas, reapply every 2 hours as needed.  - Recommend staying in the shade or  wearing long sleeves, sun glasses (UVA+UVB protection) and wide brim hats (4-inch brim around the entire circumference of the hat). - Call for new or changing lesions.  Return in about 3 months (around 08/03/2021) for recheck L hand bx/edc site.  I, Othelia Pulling, RMA, am acting as scribe  for Sarina Ser, MD . Documentation: I have reviewed the above documentation for accuracy and completeness, and I agree with the above.  Sarina Ser, MD

## 2021-05-06 NOTE — Patient Instructions (Addendum)

## 2021-05-07 ENCOUNTER — Encounter: Payer: Self-pay | Admitting: Dermatology

## 2021-05-10 NOTE — Addendum Note (Signed)
Addended by: Ralene Bathe on: 05/10/2021 01:51 PM   Modules accepted: Level of Service

## 2021-05-11 ENCOUNTER — Telehealth: Payer: Self-pay

## 2021-05-11 NOTE — Telephone Encounter (Signed)
Patient's daughter advised of BX results.

## 2021-05-11 NOTE — Telephone Encounter (Signed)
-----   Message from Ralene Bathe, MD sent at 05/09/2021  3:32 PM EST ----- Diagnosis Skin (M), L hand WELL DIFFERENTIATED SQUAMOUS CELL CARCINOMA, BASE INVOLVED  Cancer - SCC Already treated Re-check next visit Call or return if evidence of re-growth

## 2021-05-11 NOTE — Telephone Encounter (Signed)
LM on VM please return our call to discuss biopsy results

## 2021-05-28 ENCOUNTER — Other Ambulatory Visit: Payer: Self-pay | Admitting: Internal Medicine

## 2021-05-28 DIAGNOSIS — I7121 Aneurysm of the ascending aorta, without rupture: Secondary | ICD-10-CM

## 2021-06-15 ENCOUNTER — Other Ambulatory Visit: Payer: Medicare PPO

## 2021-06-15 ENCOUNTER — Ambulatory Visit: Payer: Medicare PPO

## 2021-06-16 ENCOUNTER — Ambulatory Visit: Admission: RE | Admit: 2021-06-16 | Payer: Medicare PPO | Source: Ambulatory Visit

## 2021-06-16 ENCOUNTER — Ambulatory Visit: Payer: Medicare PPO

## 2021-07-09 ENCOUNTER — Other Ambulatory Visit: Payer: Medicare PPO

## 2021-07-14 ENCOUNTER — Ambulatory Visit
Admission: RE | Admit: 2021-07-14 | Discharge: 2021-07-14 | Disposition: A | Discharge status: Home / Self Care | Payer: Medicare PPO | Source: Ambulatory Visit | Attending: Internal Medicine | Admitting: Internal Medicine

## 2021-07-14 DIAGNOSIS — I7121 Aneurysm of the ascending aorta, without rupture: Secondary | ICD-10-CM | POA: Insufficient documentation

## 2021-07-14 LAB — POCT I-STAT CREATININE: Creatinine, Ser: 0.7 mg/dL (ref 0.44–1.00)

## 2021-07-14 MED ORDER — IOHEXOL 350 MG/ML SOLN
75.0000 mL | Freq: Once | INTRAVENOUS | Status: AC | PRN
  Administered 2021-07-14: 75 mL via INTRAVENOUS

## 2021-07-15 ENCOUNTER — Ambulatory Visit: Payer: Medicare PPO | Admitting: Podiatry

## 2021-07-15 DIAGNOSIS — M79674 Pain in right toe(s): Secondary | ICD-10-CM | POA: Diagnosis not present

## 2021-07-15 DIAGNOSIS — M79675 Pain in left toe(s): Secondary | ICD-10-CM | POA: Diagnosis not present

## 2021-07-15 DIAGNOSIS — B351 Tinea unguium: Secondary | ICD-10-CM | POA: Diagnosis not present

## 2021-07-18 NOTE — Progress Notes (Signed)
?  Subjective:  ?Patient ID: Gloria [name], female    DOB: Aug 22, 1925,  MRN: [id number] ? ?Chief Complaint  ?Patient presents with  ? Nail Problem  ?  Nail trim RFC  ? ? ?88 y.o. female returns with the above complaint. History confirmed with patient.  The nails are getting painful before the visit especially great toenail ? ?Objective:  ?Physical Exam: ?warm, good capillary refill, no trophic changes or ulcerative lesions, normal DP and PT pulses and normal sensory exam. Onychomycosis of b/l hallux nails. Calluses painful to palpation left lateral submetatarsal 5.   ? ? ? ?Assessment:  ? ?1. Pain due to onychomycosis of toenails of both feet   ? ? ? ?Plan:  ?Patient was evaluated and treated and all questions answered. ? ?Discussed the etiology and treatment options for the condition in detail with the patient. Educated patient on the topical and oral treatment options for mycotic nails. Recommended debridement of the nails today. Sharp and mechanical debridement performed of all painful and mycotic nails today. Nails debrided in length and thickness using a nail nipper and a mechanical burr to level of comfort. Discussed treatment options including appropriate shoe gear. Follow up in 9 weeks for her painful nails.  We also discussed the option of permanent partial matricectomy to remove the ingrown nails.  I would further avoid this in a patient her age, we will move her visits to 9 weeks to see if this alleviates pain in between. ? ? ?Callus was debrided as a courtesy today. ? ? ? ?Return in about 9 weeks (around 09/16/2021) for painful ingrowing nails, calluses.  ? ?

## 2021-08-04 ENCOUNTER — Ambulatory Visit: Payer: Medicare PPO | Admitting: Dermatology

## 2021-08-04 ENCOUNTER — Encounter: Payer: Self-pay | Admitting: Dermatology

## 2021-08-04 DIAGNOSIS — L578 Other skin changes due to chronic exposure to nonionizing radiation: Secondary | ICD-10-CM | POA: Diagnosis not present

## 2021-08-04 DIAGNOSIS — L814 Other melanin hyperpigmentation: Secondary | ICD-10-CM | POA: Diagnosis not present

## 2021-08-04 DIAGNOSIS — L821 Other seborrheic keratosis: Secondary | ICD-10-CM

## 2021-08-04 DIAGNOSIS — Z85828 Personal history of other malignant neoplasm of skin: Secondary | ICD-10-CM

## 2021-08-04 DIAGNOSIS — L820 Inflamed seborrheic keratosis: Secondary | ICD-10-CM | POA: Diagnosis not present

## 2021-08-04 DIAGNOSIS — L720 Epidermal cyst: Secondary | ICD-10-CM

## 2021-08-04 NOTE — Patient Instructions (Addendum)
Cryotherapy Aftercare ? ?Wash gently with soap and water everyday.   ?Apply Vaseline and Band-Aid daily until healed.  ? ?Prior to procedure, discussed risks of blister formation, small wound, skin dyspigmentation, or rare scar following cryotherapy. Recommend Vaseline ointment to treated areas while healing.  ? ? ?Recommend daily broad spectrum sunscreen SPF 30+ to sun-exposed areas, reapply every 2 hours as needed. Call for new or changing lesions.  ?Staying in the shade or wearing long sleeves, sun glasses (UVA+UVB protection) and wide brim hats (4-inch brim around the entire circumference of the hat) are also recommended for sun protection.  ? ? ? ?If You Need Anything After Your Visit ? ?If you have any questions or concerns for your doctor, please call our main line at [phone] and press option 4 to reach your doctor's medical assistant. If no one answers, please leave a voicemail as directed and we will return your call as soon as possible. Messages left after 4 pm will be answered the following business day.  ? ?You may also send [place] a message via MyChart. We typically respond to MyChart messages within 1-2 business days. ? ?For prescription refills, please ask your pharmacy to contact our office. Our fax number is [phone]. ? ?If you have an urgent issue when the clinic is closed that cannot wait until the next business day, you can page your doctor at the number below.   ? ?Please note that while we do our best to be available for urgent issues outside of office hours, we are not available 24/7.  ? ?If you have an urgent issue and are unable to reach [place], you may choose to seek medical care at your doctor's office, retail clinic, urgent care center, or emergency room. ? ?If you have a medical emergency, please immediately call 911 or go to the emergency department. ? ?Pager Numbers ? ?- Dr. Nehemiah Massed: [phone] ? ?- Dr. Laurence Ferrari: [phone] ? ?- Dr. Nicole Kindred: [phone] ? ?In the event of inclement  weather, please call our main line at [phone] for an update on the status of any delays or closures. ? ?Dermatology Medication Tips: ?Please keep the boxes that topical medications come in in order to help keep track of the instructions about where and how to use these. Pharmacies typically print the medication instructions only on the boxes and not directly on the medication tubes.  ? ?If your medication is too expensive, please contact our office at [phone] option 4 or send [place] a message through [place].  ? ?We are unable to tell what your co-pay for medications will be in advance as this is different depending on your insurance coverage. However, we may be able to find a substitute medication at lower cost or fill out paperwork to get insurance to cover a needed medication.  ? ?If a prior authorization is required to get your medication covered by your insurance company, please allow [place] 1-2 business days to complete this process. ? ?Drug prices often vary depending on where the prescription is filled and some pharmacies may offer cheaper prices. ? ?The website www.goodrx.com contains coupons for medications through different pharmacies. The prices here do not account for what the cost may be with help from insurance (it may be cheaper with your insurance), but the website can give you the price if you did not use any insurance.  ?- You can print the associated coupon and take it with your prescription to the pharmacy.  ?- You may also stop by our  office during regular business hours and pick up a GoodRx coupon card.  ?- If you need your prescription sent electronically to a different pharmacy, notify our office through [facility] or by phone at [phone] option 4. ? ? ? ? ?Si Usted Necesita Algo Despu?s de Su Visita ? ?Tambi?n puede enviarnos un mensaje a trav?s de MyChart. Por lo general respondemos a los mensajes de MyChart en el transcurso de 1 a 2 d?as h?biles. ? ?Para renovar recetas,  por favor pida a su farmacia que se ponga en contacto con nuestra oficina. Nuestro n?mero de fax es el [phone]. ? ?Si tiene un asunto urgente cuando la cl?nica est? cerrada y que no puede esperar hasta el siguiente d?a h?bil, puede llamar/localizar a su doctor(a) al n?mero que aparece a continuaci?n.  ? ?Por favor, tenga en cuenta que aunque hacemos todo lo posible para estar disponibles para asuntos urgentes fuera del horario de oficina, no estamos disponibles las 24 horas del d?a, los 7 d?as de la semana.  ? ?Si tiene un problema urgente y no puede comunicarse con nosotros, puede optar por buscar atenci?n m?dica  en el consultorio de su doctor(a), en una cl?nica privada, en un centro de atenci?n urgente o en una sala de emergencias. ? ?Si tiene Engineer, maintenance (IT) m?dica, por favor llame inmediatamente al 911 o vaya a la sala de emergencias. ? ?N?meros de b?per ? ?- Dr. Nehemiah Massed: [phone] ? ?- Dra. Moye: [phone] ? ?- Dra. Nicole Kindred: [phone] ? ?En caso de inclemencias del tiempo, por favor llame a nuestra l?nea principal al [phone] para una actualizaci?n sobre el estado de cualquier retraso o cierre. ? ?Consejos para la medicaci?n en dermatolog?a: ?Por favor, guarde las cajas en las que vienen los medicamentos de uso t?pico para ayudarle a seguir las instrucciones sobre d?nde y c?mo usarlos. Las farmacias generalmente imprimen las instrucciones del medicamento s?lo en las cajas y no directamente en los tubos del [place].  ? ?Si su medicamento es muy caro, por favor, p?ngase en contacto con Zigmund Daniel llamando al [phone] y presione la opci?n 4 o env?enos un mensaje a trav?s de MyChart.  ? ?No podemos decirle cu?l ser? su copago por los medicamentos por adelantado ya que esto es diferente dependiendo de la cobertura de su seguro. Sin embargo, es posible que podamos encontrar un medicamento sustituto a Electrical engineer un formulario para que el seguro cubra el medicamento que se  considera necesario.  ? ?Si se requiere Ardelia Mems autorizaci?n previa para que su compa??a de seguros [place] su medicamento, por favor perm?tanos de 1 a 2 d?as h?biles para completar este proceso. ? ?Los precios de los medicamentos var?an con frecuencia dependiendo del Environmental consultant de d?nde se surte la receta y alguna farmacias pueden ofrecer precios m?s baratos. ? ?El sitio web www.goodrx.com tiene cupones para medicamentos de Airline pilot. Los precios aqu? no tienen en cuenta lo que podr?a costar con la ayuda del seguro (puede ser m?s barato con su seguro), pero el sitio web puede darle el precio si no utiliz? ning?n seguro.  ?- Puede imprimir el cup?n correspondiente y llevarlo con su receta a la farmacia.  ?- Tambi?n puede pasar por nuestra oficina durante el horario de atenci?n regular y recoger una tarjeta de cupones de GoodRx.  ?- Si necesita que su receta se env?e electr?nicamente a Chiropodist, informe a nuestra oficina a trav?s de MyChart de [place] o por tel?fono llamando al [phone] y presione la opci?n 4.  ?

## 2021-08-04 NOTE — Progress Notes (Signed)
? ?  Follow-Up Visit ?  ?Subjective  ?Gloria [name] is a 88 y.o. female who presents for the following: History of SCC (3 month recheck. Bx proven SCC of left lateral dorsal hand. [facility] 05/06/2021). ? ?Daughter, [name], with patient.  ? ?The patient has spots, moles and lesions to be evaluated, some may be new or changing and the patient has concerns that these could be cancer. ? ?The following portions of the chart were reviewed this encounter and updated as appropriate:  Tobacco  Allergies  Meds  Problems  Med Hx  Surg Hx  Fam Hx   ?  ?Review of Systems: No other skin or systemic complaints except as noted in HPI or Assessment and Plan. ? ?Objective  ?Well appearing patient in no apparent distress; mood and affect are within normal limits. ? ?A focused examination was performed including face, hands. Relevant physical exam findings are noted in the Assessment and Plan. ? ?Abdomen-Upper, inframammary  x5, back x4 (9) ?Erythematous keratotic or waxy stuck-on papule or plaque. ? ? ?Assessment & Plan  ? ?Actinic Damage ?- chronic, secondary to cumulative UV radiation exposure/sun exposure over time ?- diffuse scaly erythematous macules with underlying dyspigmentation ?- Recommend daily broad spectrum sunscreen SPF 30+ to sun-exposed areas, reapply every 2 hours as needed.  ?- Recommend staying in the shade or wearing long sleeves, sun glasses (UVA+UVB protection) and wide brim hats (4-inch brim around the entire circumference of the hat). ?- Call for new or changing lesions. ? ?Lentigines ?- Scattered tan macules ?- Due to sun exposure ?- Benign-appering, observe ?- Recommend daily broad spectrum sunscreen SPF 30+ to sun-exposed areas, reapply every 2 hours as needed. ?- Call for any changes ? 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 [facility] 05/06/2021. ?- No evidence of recurrence today ?- No lymphadenopathy ?- Recommend regular full body skin exams ?- Recommend daily broad spectrum  sunscreen SPF 30+ to sun-exposed areas, reapply every 2 hours as needed.  ?- Call if any new or changing lesions are noted between office visits ? ?History of Basal Cell Carcinoma of the Skin ?- No evidence of recurrence today ?- Recommend regular full body skin exams ?- Recommend daily broad spectrum sunscreen SPF 30+ to sun-exposed areas, reapply every 2 hours as needed.  ?- Call if any new or changing lesions are noted between office visits ? ?Seborrheic Keratoses ?- Stuck-on, waxy, tan-brown papules and/or plaques  ?- Benign-appearing ?- Discussed benign etiology and prognosis. ?- Observe ?- Call for any changes ? ?Milia ?- tiny firm white papules ?- type of cyst ?- benign ?- may be extracted if symptomatic ?- observe ? ?Inflamed seborrheic keratosis (9) ?Abdomen-Upper, inframammary  x5, back x4 ? ?Destruction of lesion - Abdomen-Upper, inframammary  x5, back x4 ?Complexity: simple   ?Destruction method: cryotherapy   ?Informed consent: discussed and consent obtained   ?Timeout:  patient name, date of birth, surgical site, and procedure verified ?Lesion destroyed using liquid nitrogen: Yes   ?Region frozen until ice ball extended beyond lesion: Yes   ?Outcome: patient tolerated procedure well with no complications   ?Post-procedure details: wound care instructions given   ? ? ?Return in about 1 year (around 08/05/2022) for TBSE, HxSCC, BCC's. ? ?I, Emelia Salisbury, CMA, am acting as scribe for Sarina Ser, MD. ?Documentation: I have reviewed the above documentation for accuracy and completeness, and I agree with the above. ? ?Sarina Ser, MD ? ? ?

## 2021-08-12 ENCOUNTER — Encounter: Payer: Self-pay | Admitting: Dermatology

## 2021-08-12 DIAGNOSIS — R911 Solitary pulmonary nodule: Secondary | ICD-10-CM | POA: Insufficient documentation

## 2021-08-12 HISTORY — DX: Solitary pulmonary nodule: R91.1

## 2021-09-16 ENCOUNTER — Ambulatory Visit: Payer: Medicare PPO | Admitting: Podiatry

## 2021-10-07 ENCOUNTER — Ambulatory Visit: Payer: Medicare PPO | Admitting: Podiatry

## 2021-10-12 ENCOUNTER — Ambulatory Visit: Payer: Medicare PPO | Admitting: Podiatry

## 2021-10-12 ENCOUNTER — Ambulatory Visit: Payer: Self-pay | Admitting: Urology

## 2021-10-12 DIAGNOSIS — M79674 Pain in right toe(s): Secondary | ICD-10-CM | POA: Diagnosis not present

## 2021-10-12 DIAGNOSIS — M79675 Pain in left toe(s): Secondary | ICD-10-CM

## 2021-10-12 DIAGNOSIS — L84 Corns and callosities: Secondary | ICD-10-CM

## 2021-10-12 DIAGNOSIS — B351 Tinea unguium: Secondary | ICD-10-CM | POA: Diagnosis not present

## 2021-10-12 NOTE — Patient Instructions (Signed)
Soak Instructions     Place 1/4 cup of epsom salts (or betadine, or white vinegar) in a quart of warm tap water.  Submerge your foot or feet with outer bandage intact for the initial soak; this will allow the bandage to become moist and wet for easy lift off.  Once you remove your bandage, continue to soak in the solution for 20 minutes.  This soak should be done twice a day.  Next, remove your foot or feet from solution, blot dry the affected area    IF YOUR SKIN BECOMES IRRITATED WHILE USING THESE INSTRUCTIONS, IT IS OKAY TO SWITCH TO  WHITE VINEGAR AND WATER. Or you may use antibacterial soap and water to keep the toe clean  Monitor for any signs/symptoms of infection. Call the office immediately if any occur or go directly to the emergency room. Call with any questions/concerns.

## 2021-10-12 NOTE — Progress Notes (Signed)
  Subjective:  Patient ID: Gloria [name], female    DOB: 1926/01/18,  MRN: [id number]  Chief Complaint  Patient presents with   Nail Problem    Thick painful toenails, 3 month follow up     88 y.o. female returns with the above complaint. History confirmed with patient.  The ingrowing nail was getting painful over the last few weeks again,  Objective:  Physical Exam: warm, good capillary refill, no trophic changes or ulcerative lesions, normal DP and PT pulses and normal sensory exam. Onychomycosis of multiple nails with thickened elongated nail plates with yellow discoloration subungual debris, ingrowing lateral hallux border left. Calluses painful to palpation left lateral submetatarsal 5.      Assessment:   1. Pain due to onychomycosis of toenails of both feet   2. Callus of foot       Plan:  Patient was evaluated and treated and all questions answered.  Discussed the etiology and treatment options for the condition in detail with the patient. Educated patient on the topical and oral treatment options for mycotic nails. Recommended debridement of the nails today. Sharp and mechanical debridement performed of all painful and mycotic nails today. Nails debrided in length and thickness using a nail nipper and a mechanical burr to level of comfort. Discussed treatment options including appropriate shoe gear. Follow up in 9 weeks for her painful nails.  We also discussed the option of permanent partial matricectomy to remove the ingrown nails.  She would like to avoid this if possible we will reevaluate at next visit   Callus was debrided as a courtesy today.    Return in about 9 weeks (around 12/14/2021) for ingrowing toenail L, thick nails.

## 2021-12-16 ENCOUNTER — Ambulatory Visit: Payer: Medicare PPO | Admitting: Podiatry

## 2021-12-22 ENCOUNTER — Other Ambulatory Visit: Payer: Self-pay | Admitting: Internal Medicine

## 2021-12-22 DIAGNOSIS — R911 Solitary pulmonary nodule: Secondary | ICD-10-CM

## 2022-01-05 ENCOUNTER — Ambulatory Visit: Admission: RE | Admit: 2022-01-05 | Payer: Medicare PPO | Source: Ambulatory Visit

## 2022-01-06 ENCOUNTER — Ambulatory Visit: Payer: Medicare PPO | Admitting: Podiatry

## 2022-01-06 DIAGNOSIS — M79675 Pain in left toe(s): Secondary | ICD-10-CM | POA: Diagnosis not present

## 2022-01-06 DIAGNOSIS — B351 Tinea unguium: Secondary | ICD-10-CM

## 2022-01-06 DIAGNOSIS — M79674 Pain in right toe(s): Secondary | ICD-10-CM

## 2022-01-10 NOTE — Progress Notes (Signed)
  Subjective:  Patient ID: Gloria [name], female    DOB: 03-04-1926,  MRN: [id number]  Chief Complaint  Patient presents with   Nail Problem    Thick painful toenails, 3 month follow up     88 y.o. female returns with the above complaint. History confirmed with patient.  The ingrowing nail was getting painful over the last few weeks again,  Objective:  Physical Exam: warm, good capillary refill, no trophic changes or ulcerative lesions, normal DP and PT pulses and normal sensory exam. Onychomycosis of multiple nails with thickened elongated nail plates with yellow discoloration subungual debris, ingrowing lateral hallux border left. Calluses painful to palpation left lateral submetatarsal 5.      Assessment:   1. Pain due to onychomycosis of toenails of both feet       Plan:  Patient was evaluated and treated and all questions answered.  Discussed the etiology and treatment options for the condition in detail with the patient. Educated patient on the topical and oral treatment options for mycotic nails. Recommended debridement of the nails today. Sharp and mechanical debridement performed of all painful and mycotic nails today. Nails debrided in length and thickness using a nail nipper and a mechanical burr to level of comfort. Discussed treatment options including appropriate shoe gear.     Callus was debrided as a courtesy today.    Return if symptoms worsen or fail to improve.

## 2022-02-17 ENCOUNTER — Other Ambulatory Visit: Payer: Self-pay | Admitting: Internal Medicine

## 2022-02-17 DIAGNOSIS — R911 Solitary pulmonary nodule: Secondary | ICD-10-CM

## 2022-03-02 ENCOUNTER — Ambulatory Visit
Admission: RE | Admit: 2022-03-02 | Discharge: 2022-03-02 | Disposition: A | Discharge status: Home / Self Care | Payer: Medicare PPO | Source: Ambulatory Visit | Attending: Internal Medicine | Admitting: Internal Medicine

## 2022-03-02 DIAGNOSIS — R911 Solitary pulmonary nodule: Secondary | ICD-10-CM | POA: Diagnosis present

## 2022-03-10 ENCOUNTER — Ambulatory Visit: Payer: Medicare PPO | Admitting: Podiatry

## 2022-03-10 DIAGNOSIS — B351 Tinea unguium: Secondary | ICD-10-CM | POA: Diagnosis not present

## 2022-03-10 DIAGNOSIS — M79675 Pain in left toe(s): Secondary | ICD-10-CM

## 2022-03-10 DIAGNOSIS — M79674 Pain in right toe(s): Secondary | ICD-10-CM

## 2022-03-14 NOTE — Progress Notes (Signed)
  Subjective:  Patient ID: Gloria [name], female    DOB: [birth year],  MRN: [id number]  Chief Complaint  Patient presents with   Nail Problem    Thick painful toenails, 3 month follow up    88 y.o. female returns with the above complaint. History confirmed with patient.  Nails are now becoming painful again   Objective:  Physical Exam: warm, good capillary refill, no trophic changes or ulcerative lesions, normal DP and PT pulses and normal sensory exam. Onychomycosis of multiple nails with thickened elongated nail plates with yellow discoloration subungual debris, ingrowing lateral hallux border left.      Assessment:   1. Pain due to onychomycosis of toenails of both feet       Plan:  Patient was evaluated and treated and all questions answered.  Discussed the etiology and treatment options for the condition in detail with the patient. Educated patient on the topical and oral treatment options for mycotic nails. Recommended debridement of the nails today. Sharp and mechanical debridement performed of all painful and mycotic nails today. Nails debrided in length and thickness using a nail nipper and a mechanical burr to level of comfort. Discussed treatment options including appropriate shoe gear.        Return in about 3 months (around 06/09/2022) for painful nails.

## 2022-03-28 ENCOUNTER — Emergency Department
Admission: EM | Admit: 2022-03-28 | Discharge: 2022-03-28 | Disposition: A | Discharge status: Home / Self Care | Payer: Medicare PPO | Attending: Emergency Medicine | Admitting: Emergency Medicine

## 2022-03-28 ENCOUNTER — Encounter: Payer: Self-pay | Admitting: Emergency Medicine

## 2022-03-28 DIAGNOSIS — I10 Essential (primary) hypertension: Secondary | ICD-10-CM | POA: Diagnosis not present

## 2022-03-28 DIAGNOSIS — N3000 Acute cystitis without hematuria: Secondary | ICD-10-CM | POA: Insufficient documentation

## 2022-03-28 DIAGNOSIS — E039 Hypothyroidism, unspecified: Secondary | ICD-10-CM | POA: Insufficient documentation

## 2022-03-28 DIAGNOSIS — R300 Dysuria: Secondary | ICD-10-CM | POA: Diagnosis present

## 2022-03-28 LAB — CBC
HCT: 38.7 % (ref 36.0–46.0)
Hemoglobin: 12.5 g/dL (ref 12.0–15.0)
MCH: 32.4 pg (ref 26.0–34.0)
MCHC: 32.3 g/dL (ref 30.0–36.0)
MCV: 100.3 fL — ABNORMAL HIGH (ref 80.0–100.0)
Platelets: 176 10*3/uL (ref 150–400)
RBC: 3.86 MIL/uL — ABNORMAL LOW (ref 3.87–5.11)
RDW: 16.3 % — ABNORMAL HIGH (ref 11.5–15.5)
WBC: 7.7 10*3/uL (ref 4.0–10.5)
nRBC: 0 % (ref 0.0–0.2)

## 2022-03-28 LAB — URINALYSIS, ROUTINE W REFLEX MICROSCOPIC
Bilirubin Urine: NEGATIVE
Glucose, UA: NEGATIVE mg/dL
Hgb urine dipstick: NEGATIVE
Ketones, ur: NEGATIVE mg/dL
Nitrite: NEGATIVE
Protein, ur: NEGATIVE mg/dL
Specific Gravity, Urine: 1.005 (ref 1.005–1.030)
WBC, UA: >50 WBC/hpf — ABNORMAL HIGH (ref 0–5)
pH: 7 (ref 5.0–8.0)

## 2022-03-28 LAB — BASIC METABOLIC PANEL
Anion gap: 5 (ref 5–15)
BUN: 19 mg/dL (ref 8–23)
CO2: 25 mmol/L (ref 22–32)
Calcium: 9.3 mg/dL (ref 8.9–10.3)
Chloride: 113 mmol/L — ABNORMAL HIGH (ref 98–111)
Creatinine, Ser: 0.73 mg/dL (ref 0.44–1.00)
GFR, Estimated: >60 mL/min (ref >60)
Glucose, Bld: 100 mg/dL — ABNORMAL HIGH (ref 70–99)
Potassium: 3.8 mmol/L (ref 3.5–5.1)
Sodium: 143 mmol/L (ref 135–145)

## 2022-03-28 MED ORDER — CIPROFLOXACIN HCL 500 MG PO TABS: 500.0000 mg | ORAL_TABLET | Freq: Two times a day (BID) | ORAL | 0 refills | Status: AC

## 2022-03-28 MED ORDER — PHENAZOPYRIDINE HCL 100 MG PO TABS: 100.0000 mg | ORAL_TABLET | Freq: Three times a day (TID) | ORAL | 0 refills | Status: DC | PRN

## 2022-03-28 NOTE — ED Provider Notes (Signed)
[facility] Provider Note    Event Date/Time   First MD Initiated Contact with Patient 03/28/22 [phone]     (approximate)   History   Urinary Frequency   HPI  Gloria [name] is a 88 y.o. female   presents to the ED with complaint of urinary frequency and dysuria with history of cystitis in the past.  Patient denies any nausea, vomiting, fever, chills or flank pain.  Patient reports that the last urinary tract infection was probably a year ago.  Patient has a history of hypertension, hypothyroidism, GERD, rheumatoid arthritis, osteopenia and aneurysm of the ascending aorta.      Physical Exam   Triage Vital Signs: ED Triage Vitals  Enc Vitals Group     BP 03/28/22 0616 (!) 146/81     Pulse Rate 03/28/22 0616 68     Resp 03/28/22 0616 18     Temp 03/28/22 0616 98.4 F (36.9 C)     Temp Source 03/28/22 0616 Oral     SpO2 03/28/22 0616 96 %     Weight --      Height --      Head Circumference --      Peak Flow --      Pain Score 03/28/22 0609 0     Pain Loc --      Pain Edu? --      Excl. in [place]? --     Most recent vital signs: Vitals:   03/28/22 0616 03/28/22 0958  BP: (!) 146/81 (!) 140/82  Pulse: 68 66  Resp: 18 18  Temp: 98.4 F (36.9 C) 98.2 F (36.8 C)  SpO2: 96% 96%     General: Awake, no distress.  Alert, oriented, talkative. CV:  Good peripheral perfusion.  Heart regular rate and rhythm. Resp:  Normal effort.  Clear bilaterally. Abd:  No distention.  Soft, nontender.  No CVA tenderness. Other:     ED Results / Procedures / Treatments   Labs (all labs ordered are listed, but only abnormal results are displayed) Labs Reviewed  URINALYSIS, ROUTINE W REFLEX MICROSCOPIC - Abnormal; Notable for the following components:      Result Value   Color, Urine YELLOW (*)    APPearance CLOUDY (*)    Leukocytes,Ua LARGE (*)    WBC, UA >50 (*)    Bacteria, UA RARE (*)    All other components within normal limits  BASIC METABOLIC  PANEL - Abnormal; Notable for the following components:   Chloride 113 (*)    Glucose, Bld 100 (*)    All other components within normal limits  CBC - Abnormal; Notable for the following components:   RBC 3.86 (*)    MCV 100.3 (*)    RDW 16.3 (*)    All other components within normal limits     PROCEDURES:  Critical Care performed:   Procedures   MEDICATIONS ORDERED IN ED: Medications - No data to display   IMPRESSION / MDM / [place] / ED COURSE  I reviewed the triage vital signs and the nursing notes.   Differential diagnosis includes, but is not limited to, urinary tract infection, acute cystitis, pyelonephritis, kidney stone.  88 year old female presents to the ED with complaint of acute cystitis with history of the same approximately 1 year ago.  Patient states that the last antibiotic she was on a year ago with Cipro which worked well and she had no complications.  She was also placed  on a medication for discomfort.  Patient was made aware that she does have a urinary tract infection again.  A prescription for Cipro was sent to the pharmacy along with a prescription for Pyridium 3 times daily and she is aware that this medication will discolor her urine but help with the bladder spasms causing her frequency.  She is to follow-up with her PCP if any continued problems.  She is return to the emergency department if any worsening of her symptoms such as nausea, vomiting, fever or chills.      Patient's presentation is most consistent with acute complicated illness / injury requiring diagnostic workup.  FINAL CLINICAL IMPRESSION(S) / ED DIAGNOSES   Final diagnoses:  Acute cystitis without hematuria     Rx / DC Orders   ED Discharge Orders          Ordered    ciprofloxacin (CIPRO) 500 MG tablet  2 times daily        03/28/22 0951    phenazopyridine (PYRIDIUM) 100 MG tablet  3 times daily PRN        03/28/22 3154             Note:  This document  was prepared using Dragon voice recognition software and may include unintentional dictation errors.   Johnn Hai, PA-C 03/28/22 1227    Nena Polio, MD 03/28/22 1350

## 2022-03-28 NOTE — ED Notes (Signed)
Pt here for possible UTI. Pt states she has them frequently and usually takes cipro but couldn't get in to her dr because he was out of town. Pt VSS, NAD

## 2022-03-28 NOTE — Discharge Instructions (Signed)
Follow-up with Dr. Caryl Comes if any continued problems or concerns.  Increase fluids.  A prescription for antibiotics was sent to your pharmacy along with the medication that we discussed that we will decrease the amount of bladder spasms you are having and decrease the frequency in which you are having to go.  Turn to the emergency department if any severe worsening of your symptoms such as nausea, vomiting, fever or chills.

## 2022-03-28 NOTE — ED Triage Notes (Addendum)
Pt to ED via POV with c/o urinary frequency. Pt states "I think I have another case of cystitis". Pt requests a script for keflex. Pt denies any burning with urination at this time. Denies CP, fevers, or SOB

## 2022-04-28 ENCOUNTER — Encounter: Payer: Self-pay | Admitting: Student

## 2022-04-28 ENCOUNTER — Ambulatory Visit: Payer: Medicare PPO | Admitting: Student

## 2022-04-28 VITALS — BP 122/68 | HR 63 | Temp 98.1°F | Ht 67.0 in | Wt 152.0 lb

## 2022-04-28 DIAGNOSIS — W5501XA Bitten by cat, initial encounter: Secondary | ICD-10-CM

## 2022-04-28 DIAGNOSIS — L03113 Cellulitis of right upper limb: Secondary | ICD-10-CM | POA: Diagnosis not present

## 2022-04-28 MED ORDER — AMOXICILLIN-POT CLAVULANATE 875-125 MG PO TABS: 1.0000 | ORAL_TABLET | Freq: Two times a day (BID) | ORAL | 0 refills | Status: AC

## 2022-04-28 NOTE — Progress Notes (Signed)
Location:   [facility]   Place of Service:   [facility]  Provider: Unk Lightning  Code Status: DNR Goals of Care:     04/28/2022    1:29 PM  Advanced Directives  Does Patient Have a Medical Advance Directive? Yes  Type of Paramedic of [place];Out of facility DNR (pink MOST or yellow form)  Does patient want to make changes to medical advance directive? No - Patient declined  Copy of [place] in Chart? No - copy requested     Chief Complaint  Patient presents with   Acute Visit    Cat Bite on Monday and has become more sore and tender to touch.     HPI: Patient is a 89 y.o. female seen today for an acute visit for a cat bite. Occurred 48 hours ago. She has had more tenderness and redness. No notable lost teeth after the incident.   She has had the dog for 17 years. The cat is vaccinated.   She denies fevers and chills. She doesn't eat much anyway.   Past Medical History:  Diagnosis Date   Arthritis    Basal cell carcinoma    Left chin, right nasolabial   Collagen vascular disease (HCC)    RA   Heartburn    Hypertension    Squamous cell carcinoma of skin 05/06/2021   left hand, treated with [facility]    Past Surgical History:  Procedure Laterality Date   ABDOMINAL HYSTERECTOMY     BREAST CYST EXCISION     HIP ARTHROPLASTY Right 11/30/2019   Procedure: ARTHROPLASTY BIPOLAR HIP (HEMIARTHROPLASTY);  Surgeon: Corky Mull, MD;  Location: [facility];  Service: Orthopedics;  Laterality: Right;   SACROPLASTY N/A 12/17/2020   Procedure: SACROPLASTY;  Surgeon: Hessie Knows, MD;  Location: [facility];  Service: Orthopedics;  Laterality: N/A;   THORACIC AORTIC ENDOVASCULAR STENT GRAFT N/A 04/11/2019   Procedure: THORACIC AORTIC ENDOVASCULAR STENT GRAFT insertion;  Surgeon: Serafina Mitchell, MD;  [facility];  Service: Vascular;  Laterality: N/A;   ULTRASOUND GUIDANCE FOR VASCULAR ACCESS Right 04/11/2019   Procedure: Ultrasound Guidance For  Vascular Access, right femoral artery;  Surgeon: Serafina Mitchell, MD;  Location: [place];  Service: Vascular;  Laterality: Right;    Allergies  Allergen Reactions   Amoxicillin Other (See Comments)    intolerant   Codeine Nausea Only   Ibandronate Sodium    Ibandronate Sodium  [Ibandronic Acid] Other (See Comments)    Gi upset   Risedronate Other (See Comments)    Gi upset   Risedronate Sodium    Sulfa Antibiotics Hives    Outpatient Encounter Medications as of 04/28/2022  Medication Sig   acetaminophen (TYLENOL) 325 MG tablet Take 2 tablets (650 mg total) by mouth every 4 (four) hours as needed for moderate pain.   amLODipine (NORVASC) 5 MG tablet Take 5 mg by mouth daily.   aspirin EC 81 MG tablet Take 81 mg by mouth daily. Swallow whole.   atorvastatin (LIPITOR) 20 MG tablet Take 20 mg by mouth daily.   carvedilol (COREG) 6.25 MG tablet Take 6.25 mg by mouth 2 (two) times daily with a meal.   folic acid (FOLVITE) 1 MG tablet Take 1 mg by mouth daily.   levothyroxine (SYNTHROID, LEVOTHROID) 75 MCG tablet Take 75 mcg by mouth daily.    methotrexate (RHEUMATREX) 2.5 MG tablet Take 15 mg by mouth every Friday.   mirabegron ER (MYRBETRIQ)  25 MG TB24 tablet Take 25 mg by mouth daily.   Multiple Vitamin (MULTIVITAMIN WITH MINERALS) TABS tablet Take 1 tablet by mouth daily.   predniSONE (DELTASONE) 5 MG tablet Take 2.5 mg by mouth daily.   solifenacin (VESICARE) 10 MG tablet Take 1 tablet by mouth daily.   [DISCONTINUED] amLODipine (NORVASC) 5 MG tablet Take 1 tablet (5 mg total) by mouth daily.   [DISCONTINUED] carvedilol (COREG) 6.25 MG tablet Take 1 tablet (6.25 mg total) by mouth 2 (two) times daily with a meal.   [DISCONTINUED] oxyCODONE (OXY IR/ROXICODONE) 5 MG immediate release tablet Take 5 mg by mouth every 4 (four) hours as needed for pain.   [DISCONTINUED] phenazopyridine (PYRIDIUM) 100 MG tablet Take 1 tablet (100 mg total) by mouth 3 (three) times daily as needed for pain.    No facility-administered encounter medications on file as of 04/28/2022.    Review of Systems:  Review of Systems  All other systems reviewed and are negative.   Health Maintenance  Topic Date Due   Medicare Annual Wellness (AWV)  Never done   DEXA SCAN  Never done   COVID-19 Vaccine (3 - Moderna risk series) 06/18/2019   DTaP/Tdap/Td (5 - Td or Tdap) 02/16/2027   Pneumonia Vaccine 53+ Years old  Completed   INFLUENZA VACCINE  Completed   Zoster Vaccines- Shingrix  Completed   HPV VACCINES  Aged Out    Physical Exam: Vitals:   04/28/22 1330  BP: 122/68  Pulse: 63  Temp: 98.1 F (36.7 C)  SpO2: 96%  Weight: 152 lb (68.9 kg)  Height: '5\' 7"'$  (1.702 m)   Body mass index is 23.81 kg/m. Physical Exam Vitals reviewed.  Constitutional:      Appearance: Normal appearance.  Skin:    Comments: 1.5 cm scratch with 3cm of surrounding erythma. Nontender. No induration. Removal of less than 1 mL of pus.   Neurological:     Mental Status: She is alert.     Labs reviewed: Basic Metabolic Panel: Recent Labs    07/14/21 1551 03/28/22 0620  NA  --  143  K  --  3.8  CL  --  113*  CO2  --  25  GLUCOSE  --  100*  BUN  --  19  CREATININE 0.70 0.73  CALCIUM  --  9.3   Liver Function Tests: No results for input(s): "AST", "ALT", "ALKPHOS", "BILITOT", "PROT", "ALBUMIN" in the last 8760 hours. No results for input(s): "LIPASE", "AMYLASE" in the last 8760 hours. No results for input(s): "AMMONIA" in the last 8760 hours. CBC: Recent Labs    03/28/22 0620  WBC 7.7  HGB 12.5  HCT 38.7  MCV 100.3*  PLT 176   Lipid Panel: No results for input(s): "CHOL", "HDL", "LDLCALC", "TRIG", "CHOLHDL", "LDLDIRECT" in the last 8760 hours. No results found for: "HGBA1C"  Procedures since last visit: No results found.  Assessment/Plan Cellulitis of right upper extremity - Plan: amoxicillin-clavulanate (AUGMENTIN) 875-125 MG tablet  Cat bite, initial encounter Patient with cat  bite 2 days ago. Less than 25m of pus removed from the area. Nontender to touch. Some surrounding erythema. Plan to start Augemtin 875-125 BID for 5 days for infection. Advised to return if not improving in the next 48 hours. Advised to take with meals and probiotic.    Labs/tests ordered:  * No order type specified * Next appt:  Visit date not found

## 2022-04-28 NOTE — Patient Instructions (Addendum)
Cleaning your wound. Rinsing out (flushing) your wound. This uses saline solution, which is made of salt and water. Putting a bandage on your wound. Antibiotic medicine. You may be given pills, cream, gel, or fluid through an IV. Your Tetanus shot was within the last 5 years, so you do not need a new tetanus vaccination.  Please apply vaseline and nonstick gauze after cleaning.

## 2022-05-13 ENCOUNTER — Encounter: Payer: Self-pay | Admitting: Podiatry

## 2022-05-17 ENCOUNTER — Ambulatory Visit: Payer: Medicare PPO | Admitting: Urology

## 2022-05-17 VITALS — BP 114/66 | HR 59 | Ht 67.0 in | Wt 151.0 lb

## 2022-05-17 DIAGNOSIS — R300 Dysuria: Secondary | ICD-10-CM | POA: Diagnosis not present

## 2022-05-17 DIAGNOSIS — N3020 Other chronic cystitis without hematuria: Secondary | ICD-10-CM | POA: Diagnosis not present

## 2022-05-17 LAB — URINALYSIS, COMPLETE
Bilirubin, UA: NEGATIVE
Glucose, UA: NEGATIVE
Ketones, UA: NEGATIVE
Nitrite, UA: NEGATIVE
Specific Gravity, UA: 1.015 (ref 1.005–1.030)
Urobilinogen, Ur: 0.2 mg/dL (ref 0.2–1.0)
pH, UA: 6 (ref 5.0–7.5)

## 2022-05-17 LAB — MICROSCOPIC EXAMINATION: WBC, UA: >30 /hpf — AB (ref 0–5)

## 2022-05-17 MED ORDER — NITROFURANTOIN MONOHYD MACRO 100 MG PO CAPS: 100.0000 mg | ORAL_CAPSULE | Freq: Two times a day (BID) | ORAL | 11 refills | Status: DC

## 2022-05-17 NOTE — Progress Notes (Signed)
05/17/2022 1:11 PM   Mekhi Mcneely Apr 22, 1925 [id number]  Referring provider: Mariana Arn, MD [address] [place],  [place] [address]  No chief complaint on file.   HPI: Dr. B: Recurrent UTIs and 1 pad a day urge incontinence; Premarin cream and Myrbetriq 25 mg last seen in 2019   Patient currently uses a least 3 pads a day that can be quite wet with urge incontinence.  Having said that her primary complaint is getting up 3-6 times a night.  She started on a sedative type medication recently and she may be getting up 3-4 times now.  She has urge incontinence but no stress incontinence.  She had a bladder surgery or sling 30 years ago   Think she is getting 1 or 2 infections in the last year.  She cannot recall or she does not think she took Myrbetriq or any other medications in the past   Hypermobility the bladder neck and negative cough test.  No prolapse   Patient has urge incontinence and primarily nocturia that is affecting her quality life.  Causes of nighttime frequency discussed.  Reassess in 6 weeks on Myrbetriq 50 mg samples and prescription   Today   Patient saw the nurse practitioner for possible urinary tract infection on vaginal estrogen and Myrbetriq 25 mg.  Patient reported multiple infections treated by primary care and was treated with ciprofloxacin.  Urine culture was positive and a culture prior to that was negative   Clinically not infected.  Improving urge incontinence.  Significant decrease in nocturia getting up once instead of 3-4 times. Myrbetriq 5 mg 30 x 11 sent  Today Frequency stable.  Appears she still on the Myrbetriq.  Her daughter thinks she has had 3 or 4 bladder infections in the last 3 to 5 months.  She describes a positive culture with them changing her antibiotic.  I did look into the medical record I saw at least one positive culture.  Her CT scan demonstrated normal kidneys April 2023  Her main symptom is increased frequency but no  pain or foul-smelling urine.  She does not think she is infected today     PMH: Past Medical History:  Diagnosis Date   Arthritis    Basal cell carcinoma    Left chin, right nasolabial   Collagen vascular disease (HCC)    RA   Heartburn    Hypertension    Squamous cell carcinoma of skin 05/06/2021   left hand, treated with [facility]    Surgical History: Past Surgical History:  Procedure Laterality Date   ABDOMINAL HYSTERECTOMY     BREAST CYST EXCISION     HIP ARTHROPLASTY Right 11/30/2019   Procedure: ARTHROPLASTY BIPOLAR HIP (HEMIARTHROPLASTY);  Surgeon: Corky Mull, MD;  Location: [facility];  Service: Orthopedics;  Laterality: Right;   SACROPLASTY N/A 12/17/2020   Procedure: SACROPLASTY;  Surgeon: Hessie Knows, MD;  Location: [facility];  Service: Orthopedics;  Laterality: N/A;   THORACIC AORTIC ENDOVASCULAR STENT GRAFT N/A 04/11/2019   Procedure: THORACIC AORTIC ENDOVASCULAR STENT GRAFT insertion;  Surgeon: Serafina Mitchell, MD;  Location[facility];  Service: Vascular;  Laterality: N/A;   ULTRASOUND GUIDANCE FOR VASCULAR ACCESS Right 04/11/2019   Procedure: Ultrasound Guidance For Vascular Access, right femoral artery;  Surgeon: Serafina Mitchell, MD;  Location: [facility];  Service: Vascular;  Laterality: Right;    Home Medications:  Allergies as of 05/17/2022       Reactions   Amoxicillin Other (See  Comments)   intolerant   Codeine Nausea Only   Ibandronate Sodium    Ibandronate Sodium  [ibandronic Acid] Other (See Comments)   Gi upset   Risedronate Other (See Comments)   Gi upset   Risedronate Sodium    Sulfa Antibiotics Hives        Medication List        Accurate as of May 17, 2022  1:11 PM. If you have any questions, ask your nurse or doctor.          acetaminophen 325 MG tablet Commonly known as: TYLENOL Take 2 tablets (650 mg total) by mouth every 4 (four) hours as needed for moderate pain.   amLODipine 5 MG tablet Commonly known as: NORVASC Take 5 mg by  mouth daily.   aspirin EC 81 MG tablet Take 81 mg by mouth daily. Swallow whole.   atorvastatin 20 MG tablet Commonly known as: LIPITOR Take 20 mg by mouth daily.   carvedilol 6.25 MG tablet Commonly known as: COREG Take 6.25 mg by mouth 2 (two) times daily with a meal.   folic acid 1 MG tablet Commonly known as: FOLVITE Take 1 mg by mouth daily.   levothyroxine 75 MCG tablet Commonly known as: SYNTHROID Take 75 mcg by mouth daily.   methotrexate 2.5 MG tablet Commonly known as: RHEUMATREX Take 15 mg by mouth every Friday.   multivitamin with minerals Tabs tablet Take 1 tablet by mouth daily.   Myrbetriq 25 MG Tb24 tablet Generic drug: mirabegron ER Take 25 mg by mouth daily.   predniSONE 5 MG tablet Commonly known as: DELTASONE Take 2.5 mg by mouth daily.   solifenacin 10 MG tablet Commonly known as: VESICARE Take 1 tablet by mouth daily.        Allergies:  Allergies  Allergen Reactions   Amoxicillin Other (See Comments)    intolerant   Codeine Nausea Only   Ibandronate Sodium    Ibandronate Sodium  [Ibandronic Acid] Other (See Comments)    Gi upset   Risedronate Other (See Comments)    Gi upset   Risedronate Sodium    Sulfa Antibiotics Hives    Family History: Family History  Problem Relation Age of Onset   Kidney disease Neg Hx    Bladder Cancer Neg Hx     Social History:  reports that she has never smoked. She has never used smokeless tobacco. She reports that she does not drink alcohol and does not use drugs.  ROS:                                        Physical Exam: There were no vitals taken for this visit.  Constitutional:  Alert and oriented, No acute distress. HEENT: [place] AT, moist mucus membranes.  Trachea midline, no masses.  Laboratory Data: Lab Results  Component Value Date   WBC 7.7 03/28/2022   HGB 12.5 03/28/2022   HCT 38.7 03/28/2022   MCV 100.3 (H) 03/28/2022   PLT 176 03/28/2022    Lab  Results  Component Value Date   CREATININE 0.73 03/28/2022    No results found for: "PSA"  No results found for: "TESTOSTERONE"  No results found for: "HGBA1C"  Urinalysis    Component Value Date/Time   COLORURINE YELLOW (A) 03/28/2022 0620   APPEARANCEUR CLOUDY (A) 03/28/2022 0620   APPEARANCEUR Hazy (A) 08/29/2020 1113   LABSPEC 1.005  03/28/2022 0620   PHURINE 7.0 03/28/2022 0620   GLUCOSEU NEGATIVE 03/28/2022 0620   HGBUR NEGATIVE 03/28/2022 0620   BILIRUBINUR NEGATIVE 03/28/2022 0620   BILIRUBINUR Negative 07/28/2020 0902   KETONESUR NEGATIVE 03/28/2022 0620   PROTEINUR NEGATIVE 03/28/2022 0620   NITRITE NEGATIVE 03/28/2022 0620   LEUKOCYTESUR LARGE (A) 03/28/2022 0620    Pertinent Imaging:   Assessment & Plan: Pathophysiology of recurrent urinary tract infections discussed.  Role of probiotics and prophylactic antibiotics discussed.  Call if culture positive.  I called in Macrodantin 100 mg 3 x 11.  Daughter thought today's urine looked a bit cloudy.  Call if positive.  Reassess 8 weeks.  There are no diagnoses linked to this encounter.  No follow-ups on file.  Reece Packer, MD  [place] [address], [place], [place] [address] [phone]

## 2022-05-18 ENCOUNTER — Telehealth: Payer: Self-pay

## 2022-05-18 MED ORDER — NITROFURANTOIN MONOHYD MACRO 100 MG PO CAPS: 100.0000 mg | ORAL_CAPSULE | Freq: Every day | ORAL | 11 refills | Status: DC

## 2022-05-18 NOTE — Addendum Note (Signed)
Addended by: Kris Mouton on: 05/18/2022 01:58 PM   Modules accepted: Orders

## 2022-05-18 NOTE — Telephone Encounter (Signed)
Patient's daughter, Donzetta Sprung per DPR on file, calling asking about UA results as she saw it was abnormal on mychart. I advised her that culture is not back yet and to take the Nitrofurantoin that was called in for maintenance at least until we have results. [name] states that patient can not take tablets or capsules and is asking if they can open the capsule and sprinkle on apple sauce for the patient to take this. I asked one of the providers at the office and was told we should not do that but the daughter stated they already did it to the first dose of the medication today and that they crush patient's other tablets for her to take. Is there a liquid medication patient can take instead or any other suggestions on the capsule issue?

## 2022-05-19 NOTE — Telephone Encounter (Addendum)
Pharmacist at [facility] states [place] can not be opened to sprinkle on apple sauce but Macrodantin can be. They can get NItrofurantoin suspension 74m/5ml and the bottle size is 230 ml and patient would have to get the whole bottle, not able to break it up. Please review.  [facility] unless it needs to wait for Dr MMatilde Sprang

## 2022-05-20 ENCOUNTER — Other Ambulatory Visit: Payer: Self-pay | Admitting: Urology

## 2022-05-20 DIAGNOSIS — N3020 Other chronic cystitis without hematuria: Secondary | ICD-10-CM

## 2022-05-20 MED ORDER — NITROFURANTOIN MACROCRYSTAL 100 MG PO CAPS: 100.0000 mg | ORAL_CAPSULE | Freq: Every day | ORAL | 0 refills | Status: DC

## 2022-05-20 NOTE — Telephone Encounter (Signed)
Spoke to Gloria [name] and informed her to be sure the medication says Macrodantin as this is the one that can be opens and mixed in apple sauce. She voiced understanding.

## 2022-05-22 LAB — CULTURE, URINE COMPREHENSIVE

## 2022-05-24 ENCOUNTER — Telehealth: Payer: Self-pay

## 2022-05-24 MED ORDER — CEFDINIR 300 MG PO CAPS: 300.0000 mg | ORAL_CAPSULE | Freq: Two times a day (BID) | ORAL | 0 refills | Status: AC

## 2022-05-24 NOTE — Telephone Encounter (Signed)
RX sent in to [facility], [facility], patient's daughter advised. I checked with pharmacist and confirmed that this medication can be sprinkled on apple sauce.

## 2022-05-24 NOTE — Telephone Encounter (Signed)
-----   Message from Bjorn Loser, MD sent at 05/24/2022  8:19 AM EST ----- Omnicef 300 mg bid for 7 days Then resume daily antibiotic ----- Message ----- From: Kris Mouton, CMA Sent: 05/24/2022   8:02 AM EST To: Bjorn Loser, MD   ----- Message ----- From: Interface, [facility] Lab Results In Sent: 05/17/2022   4:36 PM EST To: [facility]

## 2022-05-28 ENCOUNTER — Other Ambulatory Visit: Payer: Medicare PPO

## 2022-05-28 DIAGNOSIS — Z515 Encounter for palliative care: Secondary | ICD-10-CM

## 2022-05-28 NOTE — Progress Notes (Signed)
TELEPHONE ENCOUNTER   Palliative care outreach to patients daughter, Gloria [name], to discuss palliative care referral and services.  Daughter shared that she feels patient has been declining over the past few weeks. She was recently treated for a UTI that caused more cognitive decline. Patients appetite has decreased. Patient is sleeping more, per daughter. At this time patient is back to being active and participating in social activities in her [place] community. Per daughter patient Is aware of her decline, however family is not ready to discuss Hospice at this time. Family could benefit from 'hard choices' book to provide information and support in broaching end of life concerns and conversations.  SW made daughter aware that part of PC service is to discuss and review GOC which could include end of life and long term planning. Currently patient lives alone in her independent living condo at [place] and has private caregivers in the home to assist with care.   SW made daughter aware that [facility] RN/SW team does not follow patients at [place] and that she will receive communication from [facility] NP to schedule initial visit.

## 2022-06-01 ENCOUNTER — Emergency Department: Payer: Medicare PPO

## 2022-06-01 ENCOUNTER — Telehealth: Payer: Self-pay | Admitting: Hospice

## 2022-06-01 ENCOUNTER — Encounter: Payer: Self-pay | Admitting: Emergency Medicine

## 2022-06-01 ENCOUNTER — Other Ambulatory Visit: Payer: Self-pay

## 2022-06-01 ENCOUNTER — Inpatient Hospital Stay
Admission: EM | Admit: 2022-06-01 | Discharge: 2022-06-06 | DRG: 177 | Disposition: A | Discharge status: Skilled Nursing Facility | Payer: Medicare PPO | Source: Ambulatory Visit | Attending: Obstetrics and Gynecology | Admitting: Obstetrics and Gynecology

## 2022-06-01 DIAGNOSIS — D649 Anemia, unspecified: Secondary | ICD-10-CM | POA: Diagnosis present

## 2022-06-01 DIAGNOSIS — Z7982 Long term (current) use of aspirin: Secondary | ICD-10-CM

## 2022-06-01 DIAGNOSIS — Z90710 Acquired absence of both cervix and uterus: Secondary | ICD-10-CM

## 2022-06-01 DIAGNOSIS — Z79899 Other long term (current) drug therapy: Secondary | ICD-10-CM

## 2022-06-01 DIAGNOSIS — A419 Sepsis, unspecified organism: Secondary | ICD-10-CM

## 2022-06-01 DIAGNOSIS — Z515 Encounter for palliative care: Secondary | ICD-10-CM

## 2022-06-01 DIAGNOSIS — R531 Weakness: Secondary | ICD-10-CM | POA: Diagnosis not present

## 2022-06-01 DIAGNOSIS — Z7952 Long term (current) use of systemic steroids: Secondary | ICD-10-CM

## 2022-06-01 DIAGNOSIS — Z79890 Hormone replacement therapy (postmenopausal): Secondary | ICD-10-CM

## 2022-06-01 DIAGNOSIS — E039 Hypothyroidism, unspecified: Secondary | ICD-10-CM | POA: Diagnosis present

## 2022-06-01 DIAGNOSIS — Z85828 Personal history of other malignant neoplasm of skin: Secondary | ICD-10-CM

## 2022-06-01 DIAGNOSIS — Z1152 Encounter for screening for COVID-19: Secondary | ICD-10-CM

## 2022-06-01 DIAGNOSIS — M069 Rheumatoid arthritis, unspecified: Secondary | ICD-10-CM | POA: Diagnosis present

## 2022-06-01 DIAGNOSIS — J9601 Acute respiratory failure with hypoxia: Secondary | ICD-10-CM | POA: Diagnosis present

## 2022-06-01 DIAGNOSIS — M4856XA Collapsed vertebra, not elsewhere classified, lumbar region, initial encounter for fracture: Secondary | ICD-10-CM | POA: Diagnosis present

## 2022-06-01 DIAGNOSIS — Z96641 Presence of right artificial hip joint: Secondary | ICD-10-CM | POA: Diagnosis present

## 2022-06-01 DIAGNOSIS — G8929 Other chronic pain: Secondary | ICD-10-CM | POA: Diagnosis present

## 2022-06-01 DIAGNOSIS — J9811 Atelectasis: Secondary | ICD-10-CM | POA: Diagnosis present

## 2022-06-01 DIAGNOSIS — E440 Moderate protein-calorie malnutrition: Secondary | ICD-10-CM | POA: Insufficient documentation

## 2022-06-01 DIAGNOSIS — Z7401 Bed confinement status: Secondary | ICD-10-CM

## 2022-06-01 DIAGNOSIS — I712 Thoracic aortic aneurysm, without rupture, unspecified: Secondary | ICD-10-CM | POA: Diagnosis present

## 2022-06-01 DIAGNOSIS — N390 Urinary tract infection, site not specified: Secondary | ICD-10-CM

## 2022-06-01 DIAGNOSIS — J690 Pneumonitis due to inhalation of food and vomit: Principal | ICD-10-CM | POA: Diagnosis present

## 2022-06-01 DIAGNOSIS — R339 Retention of urine, unspecified: Secondary | ICD-10-CM | POA: Insufficient documentation

## 2022-06-01 DIAGNOSIS — J90 Pleural effusion, not elsewhere classified: Secondary | ICD-10-CM | POA: Diagnosis present

## 2022-06-01 DIAGNOSIS — R296 Repeated falls: Secondary | ICD-10-CM | POA: Diagnosis present

## 2022-06-01 DIAGNOSIS — E785 Hyperlipidemia, unspecified: Secondary | ICD-10-CM | POA: Diagnosis present

## 2022-06-01 DIAGNOSIS — K219 Gastro-esophageal reflux disease without esophagitis: Secondary | ICD-10-CM | POA: Diagnosis present

## 2022-06-01 DIAGNOSIS — W1830XA Fall on same level, unspecified, initial encounter: Secondary | ICD-10-CM | POA: Diagnosis present

## 2022-06-01 DIAGNOSIS — D72829 Elevated white blood cell count, unspecified: Secondary | ICD-10-CM

## 2022-06-01 DIAGNOSIS — Z66 Do not resuscitate: Secondary | ICD-10-CM | POA: Diagnosis present

## 2022-06-01 DIAGNOSIS — S3219XA Other fracture of sacrum, initial encounter for closed fracture: Secondary | ICD-10-CM | POA: Diagnosis present

## 2022-06-01 DIAGNOSIS — I739 Peripheral vascular disease, unspecified: Secondary | ICD-10-CM | POA: Diagnosis present

## 2022-06-01 DIAGNOSIS — I10 Essential (primary) hypertension: Secondary | ICD-10-CM | POA: Diagnosis present

## 2022-06-01 DIAGNOSIS — F0390 Unspecified dementia without behavioral disturbance: Secondary | ICD-10-CM | POA: Diagnosis present

## 2022-06-01 LAB — RESP PANEL BY RT-PCR (RSV, FLU A&B, COVID)  RVPGX2
Influenza A by PCR: NEGATIVE
Influenza B by PCR: NEGATIVE
Resp Syncytial Virus by PCR: NEGATIVE
SARS Coronavirus 2 by RT PCR: NEGATIVE

## 2022-06-01 LAB — URINALYSIS, ROUTINE W REFLEX MICROSCOPIC
Bilirubin Urine: NEGATIVE
Glucose, UA: NEGATIVE mg/dL
Hgb urine dipstick: NEGATIVE
Ketones, ur: NEGATIVE mg/dL
Leukocytes,Ua: NEGATIVE
Nitrite: NEGATIVE
Protein, ur: NEGATIVE mg/dL
Specific Gravity, Urine: 1.008 (ref 1.005–1.030)
pH: 7 (ref 5.0–8.0)

## 2022-06-01 LAB — COMPREHENSIVE METABOLIC PANEL WITH GFR
ALT: 16 U/L (ref 0–44)
AST: 25 U/L (ref 15–41)
Albumin: 3.5 g/dL (ref 3.5–5.0)
Alkaline Phosphatase: 60 U/L (ref 38–126)
Anion gap: 6 (ref 5–15)
BUN: 12 mg/dL (ref 8–23)
CO2: 28 mmol/L (ref 22–32)
Calcium: 8.5 mg/dL — ABNORMAL LOW (ref 8.9–10.3)
Chloride: 100 mmol/L (ref 98–111)
Creatinine, Ser: 0.78 mg/dL (ref 0.44–1.00)
GFR, Estimated: >60 mL/min
Glucose, Bld: 149 mg/dL — ABNORMAL HIGH (ref 70–99)
Potassium: 3.7 mmol/L (ref 3.5–5.1)
Sodium: 134 mmol/L — ABNORMAL LOW (ref 135–145)
Total Bilirubin: 1.3 mg/dL — ABNORMAL HIGH (ref 0.3–1.2)
Total Protein: 7 g/dL (ref 6.5–8.1)

## 2022-06-01 LAB — CBC: RBC: 3.58 — AB (ref 3.87–5.11)

## 2022-06-01 LAB — CBC WITH DIFFERENTIAL/PLATELET
Abs Immature Granulocytes: 0.12 10*3/uL — ABNORMAL HIGH (ref 0.00–0.07)
Basophils Absolute: 0.1 10*3/uL (ref 0.0–0.1)
Basophils Relative: 1 %
Eosinophils Absolute: 0.3 10*3/uL (ref 0.0–0.5)
Eosinophils Relative: 1 %
HCT: 35.9 % — ABNORMAL LOW (ref 36.0–46.0)
Hemoglobin: 11.5 g/dL — ABNORMAL LOW (ref 12.0–15.0)
Immature Granulocytes: 1 %
Lymphocytes Relative: 5 %
Lymphs Abs: 1.1 10*3/uL (ref 0.7–4.0)
MCH: 31.6 pg (ref 26.0–34.0)
MCHC: 32 g/dL (ref 30.0–36.0)
MCV: 98.6 fL (ref 80.0–100.0)
Monocytes Absolute: 1.3 10*3/uL — ABNORMAL HIGH (ref 0.1–1.0)
Monocytes Relative: 6 %
Neutro Abs: 19.4 10*3/uL — ABNORMAL HIGH (ref 1.7–7.7)
Neutrophils Relative %: 86 %
Platelets: 232 10*3/uL (ref 150–400)
RBC: 3.64 MIL/uL — ABNORMAL LOW (ref 3.87–5.11)
RDW: 16 % — ABNORMAL HIGH (ref 11.5–15.5)
WBC: 22.3 10*3/uL — ABNORMAL HIGH (ref 4.0–10.5)
nRBC: 0 % (ref 0.0–0.2)

## 2022-06-01 LAB — LIPID PANEL
Cholesterol: 145 (ref 0–200)
HDL: 58 (ref 35–70)
LDL Cholesterol: 62
Triglycerides: 125 (ref 40–160)

## 2022-06-01 LAB — CBC AND DIFFERENTIAL
HCT: 35 — AB (ref 36–46)
Hemoglobin: 11.6 — AB (ref 12.0–16.0)
WBC: 21.3

## 2022-06-01 LAB — HEPATIC FUNCTION PANEL
ALT: 15 U/L (ref 7–35)
AST: 19 (ref 13–35)
Alkaline Phosphatase: 62 (ref 25–125)
Bilirubin, Total: 1.1

## 2022-06-01 LAB — COMPREHENSIVE METABOLIC PANEL
Albumin: 3.5 (ref 3.5–5.0)
Calcium: 8.6 — AB (ref 8.7–10.7)

## 2022-06-01 LAB — BASIC METABOLIC PANEL
BUN: 11 (ref 4–21)
CO2: 25 — AB (ref 13–22)
Chloride: 103 (ref 99–108)
Creatinine: 0.7 (ref 0.5–1.1)
Glucose: 127
Potassium: 3.7 mEq/L (ref 3.5–5.1)
Sodium: 138 (ref 137–147)

## 2022-06-01 LAB — TSH: TSH: 4.33 (ref 0.41–5.90)

## 2022-06-01 MED ORDER — SODIUM CHLORIDE 0.9 % IV BOLUS
1000.0000 mL | Freq: Once | INTRAVENOUS | Status: AC
  Administered 2022-06-01: 1000 mL via INTRAVENOUS

## 2022-06-01 MED ORDER — ACETAMINOPHEN 500 MG PO TABS
1000.0000 mg | ORAL_TABLET | Freq: Once | ORAL | Status: AC
  Administered 2022-06-01: 1000 mg via ORAL
  Filled 2022-06-01: qty 2

## 2022-06-01 MED ORDER — IOHEXOL 300 MG/ML  SOLN
100.0000 mL | Freq: Once | INTRAMUSCULAR | Status: AC | PRN
  Administered 2022-06-01: 100 mL via INTRAVENOUS

## 2022-06-01 NOTE — ED Triage Notes (Signed)
Pt sent from pcp for chronic UTI since 12/23. Pt just finished antibiotics on Sunday. Pt also s/p fall on Sunday and hit head and tailbone. Pt states that head is OK but tailbone hurts 8/10

## 2022-06-01 NOTE — Telephone Encounter (Signed)
NP called yesterday and set up palliative initial consult for patient for today at 12.30pm. [name] called today to cancel because patient is delayed at her appointment and not able to keep to the consult. It was agreed she would call back later to reschedule.

## 2022-06-01 NOTE — ED Provider Notes (Signed)
[facility] Provider Note    Event Date/Time   First MD Initiated Contact with Patient 06/01/22 1505     (approximate)   History   Chief Complaint: Generalized weakness  HPI  Gloria [name] is a 89 y.o. female with a history of hypertension and recurrent UTIs who is brought to the ED due to generalized weakness.  Recently finished a course of antibiotics for UTI 2 days ago.  That same day she had a fall backwards, hitting her lower back and the back of her head on the floor.  No loss of consciousness.  Denies headache or neck pain.  Since yesterday the patient has had decreased oral intake, and today the patient had generalized weakness, unable to stand or perform her ADLs alone at home where she lives independently.  She does complain of tailbone pain since the fall.     Physical Exam   Triage Vital Signs: ED Triage Vitals  Enc Vitals Group     BP 06/01/22 1232 106/63     Pulse Rate 06/01/22 1232 62     Resp 06/01/22 1232 20     Temp 06/01/22 1232 98.6 F (37 C)     Temp Source 06/01/22 1232 Oral     SpO2 06/01/22 1232 93 %     Weight 06/01/22 1240 151 lb 0.2 oz (68.5 kg)     Height 06/01/22 1240 '5\' 7"'$  (1.702 m)     Head Circumference --      Peak Flow --      Pain Score 06/01/22 1233 8     Pain Loc --      Pain Edu? --      Excl. in [place]? --     Most recent vital signs: Vitals:   06/01/22 2100 06/01/22 2230  BP: (!) 114/53 (!) 118/56  Pulse: 66 (!) 59  Resp: 20 20  Temp:    SpO2: 96% 92%    General: Awake, no distress.  CV:  Good peripheral perfusion.  Regular rate and rhythm Resp:  Normal effort.  Clear to auscultation bilaterally Abd:  No distention.  Soft nontender Other:  No lower extremity edema.  Dry mucous membranes.  No midline spinal tenderness.   ED Results / Procedures / Treatments   Labs (all labs ordered are listed, but only abnormal results are displayed) Labs Reviewed  CBC WITH DIFFERENTIAL/PLATELET - Abnormal;  Notable for the following components:      Result Value   WBC 22.3 (*)    RBC 3.64 (*)    Hemoglobin 11.5 (*)    HCT 35.9 (*)    RDW 16.0 (*)    Neutro Abs 19.4 (*)    Monocytes Absolute 1.3 (*)    Abs Immature Granulocytes 0.12 (*)    All other components within normal limits  COMPREHENSIVE METABOLIC PANEL - Abnormal; Notable for the following components:   Sodium 134 (*)    Glucose, Bld 149 (*)    Calcium 8.5 (*)    Total Bilirubin 1.3 (*)    All other components within normal limits  URINALYSIS, ROUTINE W REFLEX MICROSCOPIC - Abnormal; Notable for the following components:   Color, Urine YELLOW (*)    APPearance CLEAR (*)    All other components within normal limits  RESP PANEL BY RT-PCR (RSV, FLU A&B, COVID)  [id number]     EKG Interpreted by me Normal sinus rhythm rate of 63.  Right axis, intraventricular conduction block, poor R wave progression,  normal ST segments and T waves.  No ischemic changes.   RADIOLOGY X-ray sacrum and coccyx shows a nondisplaced sacral fracture. CT head interpreted by me, negative for intracranial hemorrhage.  Radiology report reviewed.  CT abdomen pelvis unremarkable   PROCEDURES:  Procedures   MEDICATIONS ORDERED IN ED: Medications  sodium chloride 0.9 % bolus 1,000 mL (0 mLs Intravenous Stopped 06/01/22 1909)  iohexol (OMNIPAQUE) 300 MG/ML solution 100 mL (100 mLs Intravenous Contrast Given 06/01/22 1602)  acetaminophen (TYLENOL) tablet 1,000 mg (1,000 mg Oral Given 06/01/22 2034)     IMPRESSION / MDM / ASSESSMENT AND PLAN / ED COURSE  I reviewed the triage vital signs and the nursing notes.  DDx: Dehydration, AKI, anemia, electrolyte abnormality, UTI, ureterolithiasis, pyelonephritis, bladder diverticulum, pelvic abscess, intracranial hemorrhage, intracranial mass/cerebral edema, pneumonia  Patient's presentation is most consistent with acute presentation with potential threat to life or bodily function.  Patient presents with  generalized weakness today.  Recently completed a course of antibiotics for recurrent UTI.  No focal pain currently except for posterior pelvis related to small nondisplaced sacral fracture.  Labs unremarkable except for leukocytosis of 22,000.  Urinalysis is normal.  CT head and abdomen pelvis also unremarkable.     ----------------------------------------- 7:57 PM on 06/01/2022 ----------------------------------------- Chest x-ray unremarkable.  Orthostatics unremarkable, but patient is still too weak to stand.  She required full assist by 2 people.  Will obtain COVID/flu test and MRI brain.  ----------------------------------------- 10:59 PM on 06/01/2022 ----------------------------------------- Viral swab and MRI are unremarkable.  Will consult PT and social work to ensure a safe discharge.  Patient and family agree.      FINAL CLINICAL IMPRESSION(S) / ED DIAGNOSES   Final diagnoses:  Generalized weakness     Rx / DC Orders   ED Discharge Orders     None        Note:  This document was prepared using Dragon voice recognition software and may include unintentional dictation errors.   Carrie Mew, MD 06/01/22 [phone]

## 2022-06-02 MED ORDER — AMLODIPINE BESYLATE 5 MG PO TABS
5.0000 mg | ORAL_TABLET | Freq: Every day | ORAL | Status: DC
  Administered 2022-06-02 – 2022-06-03 (×2): 5 mg via ORAL
  Filled 2022-06-02 (×2): qty 1

## 2022-06-02 MED ORDER — METHOTREXATE SODIUM 2.5 MG PO TABS
15.0000 mg | ORAL_TABLET | ORAL | Status: DC
  Administered 2022-06-04: 15 mg via ORAL
  Filled 2022-06-02: qty 6

## 2022-06-02 MED ORDER — LEVOTHYROXINE SODIUM 50 MCG PO TABS
75.0000 ug | ORAL_TABLET | Freq: Every day | ORAL | Status: DC
  Administered 2022-06-02 – 2022-06-06 (×5): 75 ug via ORAL
  Filled 2022-06-02 (×2): qty 2
  Filled 2022-06-02 (×3): qty 1

## 2022-06-02 MED ORDER — ACETAMINOPHEN 325 MG PO TABS
650.0000 mg | ORAL_TABLET | ORAL | Status: DC | PRN
  Administered 2022-06-02 – 2022-06-06 (×7): 650 mg via ORAL
  Filled 2022-06-02 (×8): qty 2

## 2022-06-02 MED ORDER — LEVOTHYROXINE SODIUM 50 MCG PO TABS: 75.0000 ug | ORAL_TABLET | Freq: Every day | ORAL | Status: DC

## 2022-06-02 MED ORDER — NITROFURANTOIN MONOHYD MACRO 100 MG PO CAPS
100.0000 mg | ORAL_CAPSULE | Freq: Two times a day (BID) | ORAL | Status: DC
  Administered 2022-06-02 – 2022-06-03 (×4): 100 mg via ORAL
  Filled 2022-06-02 (×4): qty 1

## 2022-06-02 MED ORDER — MIRABEGRON ER 25 MG PO TB24
25.0000 mg | ORAL_TABLET | Freq: Every day | ORAL | Status: DC
  Administered 2022-06-02 – 2022-06-04 (×3): 25 mg via ORAL
  Filled 2022-06-02 (×3): qty 1

## 2022-06-02 MED ORDER — FOLIC ACID 1 MG PO TABS
1.0000 mg | ORAL_TABLET | Freq: Every day | ORAL | Status: DC
  Administered 2022-06-02 – 2022-06-06 (×5): 1 mg via ORAL
  Filled 2022-06-02 (×5): qty 1

## 2022-06-02 MED ORDER — ADULT MULTIVITAMIN W/MINERALS CH
1.0000 | ORAL_TABLET | Freq: Every day | ORAL | Status: DC
  Administered 2022-06-02 – 2022-06-06 (×5): 1 via ORAL
  Filled 2022-06-02 (×5): qty 1

## 2022-06-02 MED ORDER — CARVEDILOL 6.25 MG PO TABS
6.2500 mg | ORAL_TABLET | Freq: Two times a day (BID) | ORAL | Status: DC
  Administered 2022-06-02 – 2022-06-04 (×5): 6.25 mg via ORAL
  Filled 2022-06-02 (×5): qty 1

## 2022-06-02 MED ORDER — PREDNISONE 2.5 MG PO TABS
2.5000 mg | ORAL_TABLET | Freq: Every day | ORAL | Status: DC
  Administered 2022-06-02 – 2022-06-06 (×5): 2.5 mg via ORAL
  Filled 2022-06-02 (×5): qty 1

## 2022-06-02 MED ORDER — FESOTERODINE FUMARATE ER 4 MG PO TB24
4.0000 mg | ORAL_TABLET | Freq: Every day | ORAL | Status: DC
  Administered 2022-06-02 – 2022-06-05 (×4): 4 mg via ORAL
  Filled 2022-06-02 (×4): qty 1

## 2022-06-02 MED ORDER — ATORVASTATIN CALCIUM 20 MG PO TABS
20.0000 mg | ORAL_TABLET | Freq: Every day | ORAL | Status: DC
  Administered 2022-06-02 – 2022-06-06 (×5): 20 mg via ORAL
  Filled 2022-06-02 (×5): qty 1

## 2022-06-02 MED ORDER — ASPIRIN 81 MG PO TBEC
81.0000 mg | DELAYED_RELEASE_TABLET | Freq: Every day | ORAL | Status: DC
  Administered 2022-06-02 – 2022-06-04 (×3): 81 mg via ORAL
  Filled 2022-06-02 (×3): qty 1

## 2022-06-02 NOTE — TOC Initial Note (Signed)
Transition of Care [facility]) - Initial/Assessment Note    Patient Details  Name: Gloria [name] MRN: [id number] Date of Birth: June 16, 1925  Transition of Care [facility]) CM/SW Contact:    Tiburcio Bash, LCSW Phone Number: 06/02/2022, 10:43 AM  Clinical Narrative:                  CSW met with patient and daughter [name]) at bedside. They report patient was at [place] but are aware she is currently requiring more assistance and will need to go to SNF.   [facility] SN[facility] aware, CSW spoke with Seth Bake at [facility]   CSW informed patient/family need for PT to assess patient to send clinicals ot insurance for approval.   Pending PT assessment at this time, then insurance authorization can be initiated for [facility].   Expected Discharge Plan: Skilled Nursing Facility Barriers to Discharge: Continued Medical Work up   Patient Goals and CMS Choice Patient states their goals for this hospitalization and ongoing recovery are:: to go home CMS Medicare.gov Compare Post Acute Care list provided to:: Patient Choice offered to / list presented to : Patient      Expected Discharge Plan and Services       Living arrangements for the past 2 months: [place] [name] ([facility])                                      Prior Living Arrangements/Services Living arrangements for the past 2 months: [place] [name] ([facility])                     Activities of Daily Living      Permission Sought/Granted                  Emotional Assessment       Orientation: : Oriented to Self, Oriented to Place, Oriented to  Time, Oriented to Situation Alcohol / Substance Use: Not Applicable Psych Involvement: No (comment)  Admission diagnosis:  UTI Patient Active Problem List   Diagnosis Date Noted   Pelvic fracture ([place]) 12/18/2020   Closed pelvic fracture ([place]) 12/16/2020   HLD (hyperlipidemia) 12/16/2020   Depression 12/16/2020    Migraines 02/05/2020   Recurrent major depressive disorder, in full remission ([place]) 12/31/2019   Closed displaced midcervical fracture of right femur ([place]) 12/03/2019   Status post hip hemiarthroplasty 12/03/2019   Closed right hip fracture ([place]) 11/30/2019   Hip fracture ([place]) 11/30/2019   History of fracture of right hip 11/30/2019   Acute pain of right shoulder    Fall    Immunosuppression ([place]) 05/08/2019   Intramural hematoma of thoracic aorta ([place]) 04/08/2019   Thoracic ascending aortic aneurysm ([place]) 04/07/2019   Anemia 02/14/2018   Peripheral vascular disease ([place]) 09/14/2017   Aneurysm of ascending aorta ([place]) 02/14/2015   Essential hypertension 01/21/2014   Osteopenia 07/19/2013   Hypothyroidism 02/01/2007   GERD 02/01/2007   Rheumatoid arthritis ([place]) 02/01/2007   Disorder of bone and cartilage 02/01/2007   COLONIC POLYPS, HX OF 02/01/2007   DISEASE, CELIAC 04/05/2002   Polymyalgia rheumatica ([place]) 04/05/2001   PCP:  Adin Hector, MD Pharmacy:   [place], [place] - [address] [place] [address] Phone: [phone] Fax: [phone]  [facility] [place], [place] - [address] [place] [address] Phone: [phone] Fax: [phone]     Social Determinants of Health (SDOH) Social History: SDOH Screenings   Tobacco Use: Low Risk  (06/01/2022)   SDOH Interventions:     Readmission Risk Interventions     No data to display

## 2022-06-02 NOTE — NC FL2 (Signed)
[place] LEVEL OF CARE FORM     IDENTIFICATION  Patient Name: Gloria [name] Birthdate: 11-Oct-1925 Sex: female Admission Date (Current Location): 06/01/2022  [facility] and [place] Number:  Engineering geologist and Address:  [facility], [address], [place], [place] [address]      Provider Number: [id number]  Attending Physician Name and Address:  No att. providers found  Relative Name and Phone Number:  [name] (Daughter) [phone]    Current Level of Care: Hospital Recommended Level of Care: [place] Prior Approval Number:    Date Approved/Denied:   PASRR Number: [id number]  Discharge Plan: SNF    Current Diagnoses: Patient Active Problem List   Diagnosis Date Noted   Pelvic fracture ([place]) 12/18/2020   Closed pelvic fracture ([place]) 12/16/2020   HLD (hyperlipidemia) 12/16/2020   Depression 12/16/2020   Migraines 02/05/2020   Recurrent major depressive disorder, in full remission ([place]) 12/31/2019   Closed displaced midcervical fracture of right femur ([place]) 12/03/2019   Status post hip hemiarthroplasty 12/03/2019   Closed right hip fracture ([place]) 11/30/2019   Hip fracture ([place]) 11/30/2019   History of fracture of right hip 11/30/2019   Acute pain of right shoulder    Fall    Immunosuppression ([place]) 05/08/2019   Intramural hematoma of thoracic aorta ([place]) 04/08/2019   Thoracic ascending aortic aneurysm ([place]) 04/07/2019   Anemia 02/14/2018   Peripheral vascular disease ([place]) 09/14/2017   Aneurysm of ascending aorta ([place]) 02/14/2015   Essential hypertension 01/21/2014   Osteopenia 07/19/2013   Hypothyroidism 02/01/2007   GERD 02/01/2007   Rheumatoid arthritis ([place]) 02/01/2007   Disorder of bone and cartilage 02/01/2007   COLONIC POLYPS, HX OF 02/01/2007   DISEASE, CELIAC 04/05/2002   Polymyalgia rheumatica ([place]) 04/05/2001    Orientation RESPIRATION BLADDER Height & Weight     Time, Self,  Situation, Place  Normal Incontinent, External catheter Weight: 151 lb 0.2 oz (68.5 kg) Height:  '5\' 7"'$  (170.2 cm)  BEHAVIORAL SYMPTOMS/MOOD NEUROLOGICAL BOWEL NUTRITION STATUS      Continent Diet (see discharge summary)  AMBULATORY STATUS COMMUNICATION OF NEEDS Skin   Limited Assist Verbally Normal                       Personal Care Assistance Level of Assistance  Feeding, Dressing, Total care, Bathing Bathing Assistance: Limited assistance Feeding assistance: Independent Dressing Assistance: Limited assistance Total Care Assistance: Limited assistance   Functional Limitations Info  Sight, Speech, Hearing Sight Info: Adequate Hearing Info: Adequate Speech Info: Adequate    SPECIAL CARE FACTORS FREQUENCY  PT (By licensed PT), OT (By licensed OT)     PT Frequency: min 4x weekly OT Frequency: min 4x weekly            Contractures Contractures Info: Not present    Additional Factors Info  Code Status, Allergies Code Status Info: DNR Allergies Info: Amoxicillin  Codeine  Ibandronate Sodium  Ibandronate Sodium  (Ibandronic Acid)  Risedronate  Risedronate Sodium  Sulfa Antibiotics           Current Medications (06/02/2022):  This is the current hospital active medication list Current Facility-Administered Medications  Medication Dose Route Frequency Provider Last Rate Last Admin   acetaminophen (TYLENOL) tablet 650 mg  650 mg Oral Q4H PRN Hinda Kehr, MD       amLODipine (NORVASC) tablet 5 mg  5 mg Oral Daily Hinda Kehr, MD   5 mg at 06/02/22 [phone]  aspirin EC tablet 81 mg  81 mg Oral Daily Hinda Kehr, MD   81 mg at 06/02/22 0919   atorvastatin (LIPITOR) tablet 20 mg  20 mg Oral Daily Hinda Kehr, MD   20 mg at 06/02/22 0919   carvedilol (COREG) tablet 6.25 mg  6.25 mg Oral BID WC Hinda Kehr, MD   6.25 mg at 06/02/22 0919   fesoterodine (TOVIAZ) tablet 4 mg  4 mg Oral Daily Hinda Kehr, MD   4 mg at [id number]   folic acid (FOLVITE) tablet 1 mg  1  mg Oral Daily Hinda Kehr, MD   1 mg at 06/02/22 0919   levothyroxine (SYNTHROID) tablet 75 mcg  75 mcg Oral Q0600 Hinda Kehr, MD   75 mcg at 06/02/22 0518   [START ON 06/04/2022] methotrexate (RHEUMATREX) tablet 15 mg  15 mg Oral Q Lonie Peak, Tommi Rumps, MD       mirabegron [facility]) tablet 25 mg  25 mg Oral Daily Hinda Kehr, MD   25 mg at 06/02/22 0920   multivitamin with minerals tablet 1 tablet  1 tablet Oral Daily Hinda Kehr, MD   1 tablet at 06/02/22 0935   nitrofurantoin (macrocrystal-monohydrate) (MACROBID) capsule 100 mg  100 mg Oral BID Hinda Kehr, MD   100 mg at 06/02/22 0919   predniSONE (DELTASONE) tablet 2.5 mg  2.5 mg Oral Daily Hinda Kehr, MD   2.5 mg at 06/02/22 0920   Current Outpatient Medications  Medication Sig Dispense Refill   amLODipine (NORVASC) 5 MG tablet Take 5 mg by mouth daily.     aspirin EC 81 MG tablet Take 81 mg by mouth daily. Swallow whole.     atorvastatin (LIPITOR) 20 MG tablet Take 20 mg by mouth daily.     carvedilol (COREG) 6.25 MG tablet Take 6.25 mg by mouth 2 (two) times daily with a meal.     folic acid (FOLVITE) 1 MG tablet Take 1 mg by mouth daily.     levothyroxine (SYNTHROID, LEVOTHROID) 75 MCG tablet Take 75 mcg by mouth daily.   11   methotrexate (RHEUMATREX) 2.5 MG tablet Take 15 mg by mouth every Friday.     mirabegron ER (MYRBETRIQ) 25 MG TB24 tablet Take 25 mg by mouth daily.     Multiple Vitamin (MULTIVITAMIN WITH MINERALS) TABS tablet Take 1 tablet by mouth daily.     nitrofurantoin, macrocrystal-monohydrate, (MACROBID) 100 MG capsule Take 1 capsule (100 mg total) by mouth daily. (Patient taking differently: Take 100 mg by mouth 2 (two) times daily.) 30 capsule 11   predniSONE (DELTASONE) 5 MG tablet Take 2.5 mg by mouth daily.     solifenacin (VESICARE) 10 MG tablet Take 1 tablet by mouth daily.     acetaminophen (TYLENOL) 325 MG tablet Take 2 tablets (650 mg total) by mouth every 4 (four) hours as needed for moderate  pain.       Discharge Medications: Please see discharge summary for a list of discharge medications.  Relevant Imaging Results:  Relevant Lab Results:   Additional Information SSN: [id number]  Tiburcio Bash, LCSW

## 2022-06-02 NOTE — ED Provider Notes (Signed)
-----------------------------------------   12:05 AM on 06/02/2022 -----------------------------------------   Blood pressure (!) 125/52, pulse 61, temperature 98.8 F (37.1 C), temperature source Oral, resp. rate 20, height 1.702 m (5' 7"$ ), weight 68.5 kg, SpO2 93 %.  The patient is calm and cooperative at this time.    Given the patient's leukocytosis of about 22, I ordered a CT chest without contrast to look for evidence of interstitial pneumonia that was not present on her chest x-ray.  I viewed and interpreted the patient's CT scan and I see no obvious sign of pneumonia.  The radiologist confirmed a number of chronic changes including small bilateral pleural effusions but no obvious sign of infection.  At this point I do not feel that there is an indication for empiric antibiotics and no justification for admission/inpatient treatment.  I agree with the current plan for [facility] assistance in finding a higher level of care for the patient.  Her primary care doctor can also proceed with possible palliative care planning as was apparently discussed in the outpatient setting prior to the emergency department visit.  The pharmacy technician has also reviewed the patient's medications and I ordered her home meds for administration while she is in the emergency department as per the reported home meds completed by Duard Brady (pharmacy).   Hinda Kehr, MD 06/02/22 [phone]

## 2022-06-02 NOTE — Evaluation (Signed)
Physical Therapy Evaluation Patient Details Name: Gloria [name] MRN: [id number] DOB: 1926/01/09 Today's Date: 06/02/2022  History of Present Illness  89 y.o. female with a history of hypertension and recurrent UTIs who is brought to the ED due to generalized weakness.  Recently finished a course of antibiotics for UTI 2 days ago.  That same day she had a fall backwards, hitting her lower back and the back of her head on the floor.  Clinical Impression  Pt with general weakness and need for light assist with transfers but ultimately showed relatively good, age appropriate strength and mobility.  She apparently ambulates at independent living with AD, clearly needed UEs today with consistent veering and stagger stepping to the L and the need for occasional intervention to insure safety/balance.  Her O2 remained in the high 90s on room air with nearly 100 ft of ambulation.  Pt, however, showed poor overall safety awareness and needed consistent directional and safety cuing t/o the session.  Will benefit from continued PT to address functional limitations, recommending STR at discharge.      Recommendations for follow up therapy are one component of a multi-disciplinary discharge planning process, led by the attending physician.  Recommendations may be updated based on patient status, additional functional criteria and insurance authorization.  Follow Up Recommendations Skilled nursing-short term rehab (<3 hours/day) Can patient physically be transported by private vehicle: Yes    Assistance Recommended at Discharge Frequent or constant Supervision/Assistance  Patient can return home with the following  A little help with walking and/or transfers;A little help with bathing/dressing/bathroom;Assist for transportation;Assistance with cooking/housework    Equipment Recommendations None recommended by PT  Recommendations for Other Services       Functional Status Assessment Patient has had a  recent decline in their functional status and demonstrates the ability to make significant improvements in function in a reasonable and predictable amount of time.     Precautions / Restrictions Precautions Precautions: Fall Restrictions Weight Bearing Restrictions: No      Mobility  Bed Mobility Overal bed mobility: Needs Assistance Bed Mobility: Supine to Sit, Sit to Supine     Supine to sit: Min assist Sit to supine: Min assist   General bed mobility comments: Pt showed great effort with getting up to sitting and back to supine.  She was able to initiate both but ultimately needed direct PT assist with trunk and LEs.    Transfers Overall transfer level: Needs assistance Equipment used: Rolling walker (2 wheels) Transfers: Sit to/from Stand Sit to Stand: Min guard           General transfer comment: Pt showed good motivation and effort with getting to standing, needed UEs to rise (did not control descent well as she did not use UEs) but did not need direct assist    Ambulation/Gait Ambulation/Gait assistance: Min assist Gait Distance (Feet): 90 Feet Assistive device: Rolling walker (2 wheels)         General Gait Details: Pt with consistent veering to the L (hitting multiple stationary obstacles) despite consistent verbal and tactile cuing.  She had 2 small stagger steps needing light assist to maintain balance.  O2 remained in the high 90s on room air t/o the effort.  Stairs            Wheelchair Mobility    Modified Rankin (Stroke Patients Only)       Balance Overall balance assessment: Needs assistance Sitting-balance support: No upper extremity supported Sitting balance-Leahy Scale: Good  Standing balance support: Single extremity supported, Bilateral upper extremity supported, No upper extremity supported, During functional activity Standing balance-Leahy Scale: Fair Standing balance comment: Pt was able todo some balance challenges with  HHA (heel raises, SLS x 5 sec) and even helped adjust her diaper without UE support but had multiple small scale LOBs and one with definite need for PT intervention to avoid a fall.  She showed decent strength and balance, but poor general awareness and reaction time                             Pertinent Vitals/Pain Pain Assessment Pain Assessment: No/denies pain    Home Living Family/patient expects to be discharged to:: Skilled nursing facility                   Additional Comments: Pt lives at [facility], has aide 1 hr/day.  Has cane and 4WW but apparently does not consistently use    Prior Function Prior Level of Function : Independent/Modified Independent;Needs assist             Mobility Comments: Generally ambulates w/o AD but has had "at least 3 falls" if not more in the last 6 months.  Apparently she does not like to use ADs and either refuses or forgets ADLs Comments: will typically have some supervision during showers, toileting but apparently does not need a lot of phyiscal assist.     Hand Dominance        Extremity/Trunk Assessment   Upper Extremity Assessment Upper Extremity Assessment: Generalized weakness;Overall WFL for tasks assessed (age appropriate limitations)    Lower Extremity Assessment Lower Extremity Assessment: Generalized weakness;Overall WFL for tasks assessed (age appropriate limitations)       Communication   Communication: No difficulties  Cognition Arousal/Alertness: Awake/alert Behavior During Therapy: WFL for tasks assessed/performed Overall Cognitive Status: History of cognitive impairments - at baseline                                 General Comments: needs some cuing/deferment to daughter on information gathering, unaware of date, full situation but plesant and able to hold conversation and follow simple instruction        General Comments General comments (skin integrity,  edema, etc.): Pt able to do nearly 100 ft of ambulation with walker but consistently veering L and needing constant reminders/cues for safety.    Exercises     Assessment/Plan    PT Assessment Patient needs continued PT services  PT Problem List Decreased strength;Decreased range of motion;Decreased activity tolerance;Decreased balance;Decreased mobility;Decreased cognition;Decreased knowledge of use of DME;Decreased safety awareness       PT Treatment Interventions DME instruction;Gait training;Functional mobility training;Therapeutic activities;Therapeutic exercise;Balance training;Neuromuscular re-education;Cognitive remediation;Patient/family education    PT Goals (Current goals can be found in the Care Plan section)  Acute Rehab PT Goals Patient Stated Goal: get stronger at rehab PT Goal Formulation: With patient/family Time For Goal Achievement: 06/15/22 Potential to Achieve Goals: Good    Frequency Min 2X/week     Co-evaluation               AM-PAC PT "6 Clicks" Mobility  Outcome Measure Help needed turning from your back to your side while in a flat bed without using bedrails?: A Little Help needed moving from lying on your back to sitting on the side of a  flat bed without using bedrails?: A Little Help needed moving to and from a bed to a chair (including a wheelchair)?: A Little Help needed standing up from a chair using your arms (e.g., wheelchair or bedside chair)?: A Little Help needed to walk in hospital room?: A Little Help needed climbing 3-5 steps with a railing? : A Lot 6 Click Score: 17    End of Session Equipment Utilized During Treatment: Gait belt Activity Tolerance: Patient tolerated treatment well Patient left: in bed;with call bell/phone within reach;with family/visitor present Nurse Communication: Mobility status PT Visit Diagnosis: Difficulty in walking, not elsewhere classified (R26.2);Unsteadiness on feet (R26.81);History of falling  (Z91.81)    Time: [id number] PT Time Calculation (min) (ACUTE ONLY): 34 min   Charges:   PT Evaluation $PT Eval Low Complexity: 1 Low PT Treatments $Gait Training: 8-22 mins        Kreg Shropshire, DPT 06/02/2022, 4:02 PM

## 2022-06-02 NOTE — ED Notes (Signed)
Pt helped back into bed, bed alarm set, bed still in lowest position, call light within reach

## 2022-06-02 NOTE — ED Notes (Signed)
Pt placed on hospital bed

## 2022-06-02 NOTE — ED Notes (Signed)
Pt's family inquiring about when pt was going to be down. RN messaged PT to inquire about eta for their assessment.

## 2022-06-02 NOTE — ED Notes (Signed)
Pt given her lunch tray, daughter at bedside, bed alarm set, bed in lowest position, call light within reach

## 2022-06-02 NOTE — ED Notes (Signed)
Csw ashley at bedside

## 2022-06-02 NOTE — ED Notes (Signed)
Provider, jessup, made aware of pt's increased confusion. Pt is talking about menstruating and talking about her husband waiting at the hospital. No new orders

## 2022-06-02 NOTE — ED Notes (Signed)
Peri care provided to pt, and pt given a breakfast tray.

## 2022-06-02 NOTE — ED Notes (Signed)
Pt's daughter at bedside, and has questions for the case manager about the pt possibly going back to [facility], but not in independent living. Ashely LCSW notified via secure chat.

## 2022-06-03 ENCOUNTER — Emergency Department: Payer: Medicare PPO

## 2022-06-03 ENCOUNTER — Encounter: Payer: Self-pay | Admitting: Family Medicine

## 2022-06-03 DIAGNOSIS — J189 Pneumonia, unspecified organism: Secondary | ICD-10-CM

## 2022-06-03 DIAGNOSIS — R531 Weakness: Secondary | ICD-10-CM

## 2022-06-03 DIAGNOSIS — E785 Hyperlipidemia, unspecified: Secondary | ICD-10-CM | POA: Diagnosis present

## 2022-06-03 DIAGNOSIS — J9601 Acute respiratory failure with hypoxia: Secondary | ICD-10-CM | POA: Diagnosis present

## 2022-06-03 DIAGNOSIS — Z96641 Presence of right artificial hip joint: Secondary | ICD-10-CM | POA: Diagnosis present

## 2022-06-03 DIAGNOSIS — Z90710 Acquired absence of both cervix and uterus: Secondary | ICD-10-CM | POA: Diagnosis not present

## 2022-06-03 DIAGNOSIS — J690 Pneumonitis due to inhalation of food and vomit: Secondary | ICD-10-CM | POA: Diagnosis present

## 2022-06-03 DIAGNOSIS — N390 Urinary tract infection, site not specified: Secondary | ICD-10-CM | POA: Diagnosis not present

## 2022-06-03 DIAGNOSIS — R296 Repeated falls: Secondary | ICD-10-CM | POA: Diagnosis present

## 2022-06-03 DIAGNOSIS — E440 Moderate protein-calorie malnutrition: Secondary | ICD-10-CM | POA: Diagnosis present

## 2022-06-03 DIAGNOSIS — M069 Rheumatoid arthritis, unspecified: Secondary | ICD-10-CM

## 2022-06-03 DIAGNOSIS — E039 Hypothyroidism, unspecified: Secondary | ICD-10-CM | POA: Diagnosis present

## 2022-06-03 DIAGNOSIS — S3219XA Other fracture of sacrum, initial encounter for closed fracture: Secondary | ICD-10-CM | POA: Diagnosis present

## 2022-06-03 DIAGNOSIS — F0390 Unspecified dementia without behavioral disturbance: Secondary | ICD-10-CM | POA: Diagnosis present

## 2022-06-03 DIAGNOSIS — Z66 Do not resuscitate: Secondary | ICD-10-CM | POA: Diagnosis present

## 2022-06-03 DIAGNOSIS — Z1152 Encounter for screening for COVID-19: Secondary | ICD-10-CM | POA: Diagnosis not present

## 2022-06-03 DIAGNOSIS — G8929 Other chronic pain: Secondary | ICD-10-CM | POA: Diagnosis present

## 2022-06-03 DIAGNOSIS — M4856XA Collapsed vertebra, not elsewhere classified, lumbar region, initial encounter for fracture: Secondary | ICD-10-CM | POA: Diagnosis present

## 2022-06-03 DIAGNOSIS — I712 Thoracic aortic aneurysm, without rupture, unspecified: Secondary | ICD-10-CM | POA: Diagnosis present

## 2022-06-03 DIAGNOSIS — D72829 Elevated white blood cell count, unspecified: Secondary | ICD-10-CM

## 2022-06-03 DIAGNOSIS — I10 Essential (primary) hypertension: Secondary | ICD-10-CM | POA: Diagnosis present

## 2022-06-03 DIAGNOSIS — I739 Peripheral vascular disease, unspecified: Secondary | ICD-10-CM | POA: Diagnosis present

## 2022-06-03 DIAGNOSIS — J90 Pleural effusion, not elsewhere classified: Secondary | ICD-10-CM | POA: Diagnosis present

## 2022-06-03 DIAGNOSIS — W1830XA Fall on same level, unspecified, initial encounter: Secondary | ICD-10-CM | POA: Diagnosis present

## 2022-06-03 DIAGNOSIS — Z7401 Bed confinement status: Secondary | ICD-10-CM | POA: Diagnosis not present

## 2022-06-03 DIAGNOSIS — Z85828 Personal history of other malignant neoplasm of skin: Secondary | ICD-10-CM | POA: Diagnosis not present

## 2022-06-03 DIAGNOSIS — D649 Anemia, unspecified: Secondary | ICD-10-CM | POA: Diagnosis present

## 2022-06-03 DIAGNOSIS — J9811 Atelectasis: Secondary | ICD-10-CM | POA: Diagnosis present

## 2022-06-03 HISTORY — DX: Weakness: R53.1

## 2022-06-03 LAB — CBC WITH DIFFERENTIAL/PLATELET
Abs Immature Granulocytes: 0.13 10*3/uL — ABNORMAL HIGH (ref 0.00–0.07)
Basophils Absolute: 0.1 10*3/uL (ref 0.0–0.1)
Basophils Relative: 1 %
Eosinophils Absolute: 1.2 10*3/uL — ABNORMAL HIGH (ref 0.0–0.5)
Eosinophils Relative: 7 %
HCT: 36 % (ref 36.0–46.0)
Hemoglobin: 11.4 g/dL — ABNORMAL LOW (ref 12.0–15.0)
Immature Granulocytes: 1 %
Lymphocytes Relative: 4 %
Lymphs Abs: 0.8 10*3/uL (ref 0.7–4.0)
MCH: 31.8 pg (ref 26.0–34.0)
MCHC: 31.7 g/dL (ref 30.0–36.0)
MCV: 100.6 fL — ABNORMAL HIGH (ref 80.0–100.0)
Monocytes Absolute: 0.9 10*3/uL (ref 0.1–1.0)
Monocytes Relative: 5 %
Neutro Abs: 15.1 10*3/uL — ABNORMAL HIGH (ref 1.7–7.7)
Neutrophils Relative %: 82 %
Platelets: 205 10*3/uL (ref 150–400)
RBC: 3.58 MIL/uL — ABNORMAL LOW (ref 3.87–5.11)
RDW: 16 % — ABNORMAL HIGH (ref 11.5–15.5)
WBC: 18.2 10*3/uL — ABNORMAL HIGH (ref 4.0–10.5)
nRBC: 0 % (ref 0.0–0.2)

## 2022-06-03 LAB — COMPREHENSIVE METABOLIC PANEL
ALT: 13 U/L (ref 0–44)
AST: 22 U/L (ref 15–41)
Albumin: 2.9 g/dL — ABNORMAL LOW (ref 3.5–5.0)
Alkaline Phosphatase: 51 U/L (ref 38–126)
Anion gap: 9 (ref 5–15)
BUN: 15 mg/dL (ref 8–23)
CO2: 22 mmol/L (ref 22–32)
Calcium: 8.3 mg/dL — ABNORMAL LOW (ref 8.9–10.3)
Chloride: 104 mmol/L (ref 98–111)
Creatinine, Ser: 0.61 mg/dL (ref 0.44–1.00)
GFR, Estimated: >60 mL/min (ref >60)
Glucose, Bld: 144 mg/dL — ABNORMAL HIGH (ref 70–99)
Potassium: 3.3 mmol/L — ABNORMAL LOW (ref 3.5–5.1)
Sodium: 135 mmol/L (ref 135–145)
Total Bilirubin: 1.5 mg/dL — ABNORMAL HIGH (ref 0.3–1.2)
Total Protein: 6.1 g/dL — ABNORMAL LOW (ref 6.5–8.1)

## 2022-06-03 LAB — MAGNESIUM: Magnesium: 2.1 mg/dL (ref 1.7–2.4)

## 2022-06-03 LAB — RESP PANEL BY RT-PCR (RSV, FLU A&B, COVID)  RVPGX2
Influenza A by PCR: NEGATIVE
Influenza B by PCR: NEGATIVE
Resp Syncytial Virus by PCR: NEGATIVE
SARS Coronavirus 2 by RT PCR: NEGATIVE

## 2022-06-03 LAB — CBC
HCT: 34.1 % — ABNORMAL LOW (ref 36.0–46.0)
Hemoglobin: 11.3 g/dL — ABNORMAL LOW (ref 12.0–15.0)
MCH: 31.7 pg (ref 26.0–34.0)
MCHC: 33.1 g/dL (ref 30.0–36.0)
MCV: 95.8 fL (ref 80.0–100.0)
Platelets: 223 10*3/uL (ref 150–400)
RBC: 3.56 MIL/uL — ABNORMAL LOW (ref 3.87–5.11)
RDW: 15.9 % — ABNORMAL HIGH (ref 11.5–15.5)
WBC: 20.2 10*3/uL — ABNORMAL HIGH (ref 4.0–10.5)
nRBC: 0 % (ref 0.0–0.2)

## 2022-06-03 LAB — LACTIC ACID, PLASMA
Lactic Acid, Venous: 1.5 mmol/L (ref 0.5–1.9)
Lactic Acid, Venous: 1.5 mmol/L (ref 0.5–1.9)

## 2022-06-03 LAB — HIV ANTIBODY (ROUTINE TESTING W REFLEX): HIV Screen 4th Generation wRfx: NONREACTIVE

## 2022-06-03 MED ORDER — POTASSIUM CHLORIDE CRYS ER 20 MEQ PO TBCR
40.0000 meq | EXTENDED_RELEASE_TABLET | Freq: Once | ORAL | Status: AC
  Administered 2022-06-03: 40 meq via ORAL
  Filled 2022-06-03: qty 2

## 2022-06-03 MED ORDER — IPRATROPIUM-ALBUTEROL 0.5-2.5 (3) MG/3ML IN SOLN
3.0000 mL | Freq: Once | RESPIRATORY_TRACT | Status: DC
  Filled 2022-06-03: qty 3

## 2022-06-03 MED ORDER — PANTOPRAZOLE SODIUM 40 MG PO TBEC
40.0000 mg | DELAYED_RELEASE_TABLET | Freq: Every day | ORAL | Status: DC
  Administered 2022-06-03 – 2022-06-06 (×4): 40 mg via ORAL
  Filled 2022-06-03 (×4): qty 1

## 2022-06-03 MED ORDER — ENOXAPARIN SODIUM 40 MG/0.4ML IJ SOSY
40.0000 mg | PREFILLED_SYRINGE | INTRAMUSCULAR | Status: DC
  Administered 2022-06-03 – 2022-06-05 (×3): 40 mg via SUBCUTANEOUS
  Filled 2022-06-03 (×3): qty 0.4

## 2022-06-03 MED ORDER — SODIUM CHLORIDE 0.9 % IV SOLN
500.0000 mg | INTRAVENOUS | Status: DC
  Administered 2022-06-03: 500 mg via INTRAVENOUS
  Filled 2022-06-03: qty 5

## 2022-06-03 MED ORDER — SODIUM CHLORIDE 0.9 % IV SOLN
500.0000 mg | INTRAVENOUS | Status: DC
  Filled 2022-06-03: qty 5

## 2022-06-03 MED ORDER — SODIUM CHLORIDE 0.9 % IV SOLN
2.0000 g | INTRAVENOUS | Status: DC
  Administered 2022-06-03: 2 g via INTRAVENOUS
  Filled 2022-06-03 (×2): qty 20

## 2022-06-03 MED ORDER — MENTHOL 3 MG MT LOZG
1.0000 | LOZENGE | OROMUCOSAL | Status: DC | PRN
  Filled 2022-06-03: qty 9

## 2022-06-03 NOTE — Assessment & Plan Note (Signed)
BP stable Titrate home regimen 

## 2022-06-03 NOTE — TOC Progression Note (Signed)
Transition of Care [facility]) - Progression Note    Patient Details  Name: Gloria [name] MRN: [id number] Date of Birth: 10-Aug-1925  Transition [facility] Care [facility]) CM/SW [place], [place] Phone Number: 06/03/2022, 2:02 PM  Clinical Narrative:     Patient has insurance authorization for [facility] Auth ID: [id number] Candace Cruise ID: [id number]  Approved until 3/4.   Seth Bake with [facility] updated, appears patient will be admitted at this time.   Expected Discharge Plan: [place] Barriers to Discharge: Continued Medical Work up  Expected Discharge Plan and [place] arrangements for the past 2 months: [place] ([facility])                                       Social Determinants of Health (SDOH) Interventions SDOH Screenings   Tobacco Use: Low Risk  (06/01/2022)    Readmission Risk Interventions     No data to display

## 2022-06-03 NOTE — Assessment & Plan Note (Addendum)
Noted history recurrent UTIs with urine cultures February 2024 growing Klebsiella Recent completed outpatient course of antibiotics and started on Macrobid in the ER Urine cultures appear to have some resistance to Macrobid Currently on Rocephin in the setting of possible pneumonia Will continue pending repeat urine culture Follow

## 2022-06-03 NOTE — Assessment & Plan Note (Signed)
Noted progressive weakness at home with multiple recurrent falls Noted to be living at home independently prior to admission Stable workup in the ER with plan for SNF placement Will formally consult PT OT while admitted Will continue plan for SNF placement Follow

## 2022-06-03 NOTE — TOC Progression Note (Signed)
Transition of Care [facility]) - Progression Note    Patient Details  Name: Gloria [name] MRN: [id number] Date of Birth: 1926-02-17  Transition of Care [facility]) CM/SW [place], [place] Phone Number: 06/03/2022, 10:02 AM  Clinical Narrative:     Insurance authorization for [facility] started for patient, pending approval at this time.   Expected Discharge Plan: [place] Barriers to Discharge: Continued Medical Work up  Expected Discharge Plan and [place] arrangements for the past 2 months: [place] ([facility])                                       Social Determinants of Health (SDOH) Interventions SDOH Screenings   Tobacco Use: Low Risk  (06/01/2022)    Readmission Risk Interventions     No data to display

## 2022-06-03 NOTE — Assessment & Plan Note (Addendum)
White count 18-22 in the setting of UTI and now new onset hypoxia with concern for pneumonia Continue IV Rocephin and azithromycin for infectious coverage Pancultured in the ER  Trend

## 2022-06-03 NOTE — Assessment & Plan Note (Signed)
Hgb stable at 11.4

## 2022-06-03 NOTE — Assessment & Plan Note (Signed)
Appears to have stable disease  Cont home methotrexate and low dose prednisone

## 2022-06-03 NOTE — Evaluation (Signed)
Clinical/Bedside Swallow Evaluation Patient Details  Name: Gloria [name] MRN: [id number] Date of Birth: June 23, 1925  Today's Date: 06/03/2022 Time: SLP Start Time (ACUTE ONLY): 1140 SLP Stop Time (ACUTE ONLY): 1210 SLP Time Calculation (min) (ACUTE ONLY): 30 min  Past Medical History:  Past Medical History:  Diagnosis Date   Arthritis    Basal cell carcinoma    Left chin, right nasolabial   Collagen vascular disease (HCC)    RA   Heartburn    Hypertension    Squamous cell carcinoma of skin 05/06/2021   left hand, treated with [facility]   Past Surgical History:  Past Surgical History:  Procedure Laterality Date   ABDOMINAL HYSTERECTOMY     BREAST CYST EXCISION     HIP ARTHROPLASTY Right 11/30/2019   Procedure: ARTHROPLASTY BIPOLAR HIP (HEMIARTHROPLASTY);  Surgeon: Corky Mull, MD;  Location: [facility] ORS;  Service: Orthopedics;  Laterality: Right;   SACROPLASTY N/A 12/17/2020   Procedure: SACROPLASTY;  Surgeon: Hessie Knows, MD;  Location: [facility] ORS;  Service: Orthopedics;  Laterality: N/A;   THORACIC AORTIC ENDOVASCULAR STENT GRAFT N/A 04/11/2019   Procedure: THORACIC AORTIC ENDOVASCULAR STENT GRAFT insertion;  Surgeon: Serafina Mitchell, MD;  Location: [facility] OR;  Service: Vascular;  Laterality: N/A;   ULTRASOUND GUIDANCE FOR VASCULAR ACCESS Right 04/11/2019   Procedure: Ultrasound Guidance For Vascular Access, right femoral artery;  Surgeon: Serafina Mitchell, MD;  Location: [facility] OR;  Service: Vascular;  Laterality: Right;   HPI:  Per H&P, pt is a 89 y.o. female with medical history significant of rheumatoid arthritis, collagen vascular disease, hypertension, hyperlipidemia, hypothyroidism, GERD, aneurysm of the ascending aorta status post stent placement presenting with acute respiratory failure with hypoxia, UTI.  Patient noted to have been evaluated in the ER yesterday with concern for recurrent falls and recurrent UTI.  Patient noted to have completed a recent 2-week course of antibiotics for  urinary tract infection with patient developing worsening weakness and falls at home.  Patient lived independently prior to this.  Patient was placed in ER observation with plan for SNF placement and patient being placed on a course of Macrobid for UTI coverage.  Patient noted to have worsening leukocytosis with evaluation in the ER including a CT being obtained that showed no obvious signs of infection.  Macrobid was continued patient's home medications were added on.  Per report, patient had acute hypoxic episode earlier in the morning after a possible event of aspiration where nursing noticed a lot of coughed up fluid on the patient's gown.  Patient apparently unable to tolerate liquids per report.  At present, patient is mildly confused but denies any active issues including chest pain or shortness of breath.  Does have some chronic left hip pain.  No abdominal pain.  Currently in the ER with a Tmax 100.1, heart rate in the 50s 60s, BP 120s to 130s over 6100s.  Requiring 3 L nasal cannula to keep O2 sats greater than 98%.  White count 22.3-18.2.  Hemoglobin 11.4.  Creatinine 0.61.  CTA chest yesterday with small black bilateral pleural effusions with associated bibasilar atelectasis.  No overt infiltrate.  Stable thoracic aortic aneurysm.  Follow-up chest x-ray with no substantial change.    Assessment / Plan / Recommendation  Clinical Impression   Pt seen today for BSE. Pt alert w/ eyes closed but cooperative during eval. Pt laying in bed w/ cloth covering eyes upon arrival. Pt's eyes remained closed for remainder of eval. Nursing leaving upon SLP arrival. Nurse  reported pt coughing w/ thin liquids this morning. Pt's son present during eval. Pt left laying in bed w/ call button in reach, bed alarm set, and son present.  Pt on RA; afebrile; WBC elevated.  OM exam completed and notable for dry mouth; otherwise WFL.  Pt appeared to present w/ s/s of suspected pharyngeal phase dysphagia w/ trials of thin  liquids c/b multiple swallows and immediate cough. Pt's s/s were not noted in across any other texture including nectar thick liquids, puree, and solid. Following general aspiration precautions can help reduce risk of aspiration/aspiration pneumonia in all pts.  Administered trials of thin liquids, nectar thick liquids, purees, and solids. Pt's remained closed t/o session. Pt fed self intermittently trials of nectar thick liquids and purees; assistance for self feeding provided w/ trials of thin liquids and puree. During oral phase, pt exhibited adequate rotary chewing, good bolus management, and timely A-P oral transit. Noted min lingual residue w/ solid, improved by following solid w/ sips of nectar thick liquid. During pharyngeal phase of thin liquid, pt swallowed multiple times and immediately cough. Pt reported, "I can't swallow" w/ this texture. No overt, apparent s/s of pharyngeal phase difficulty noted w/ trials of nectar thick liquids, purees, nor solids.  Pt/son educated on aspiration precautions, diet recommendation, and SLP POC. Pt/son agreed.  Recommend Dys 3 diet (w/ chopped meats and spaghetti) w/ nectar thick liquids. Follow aspiration precautions at all po's. Recommend staff assist w/ self feeding as needed in setting of deconditioning. Follow General reflux precautions at all meals (remain upright 60 minutes after meal). Recommend meds crushed w/ puree. SLP will monitor in next 1-2 days for diet toleration and potential upgrade as she improved medically.  SLP Visit Diagnosis: Dysphagia, pharyngeal phase (R13.13)    Aspiration Risk  Moderate aspiration risk;Severe aspiration risk    Diet Recommendation   Recommend Dys 3 diet (w/ chopped meats and spaghetti) w/ nectar thick liquids. Follow aspiration precautions at all po's. Recommend staff assist w/ self feeding as needed in setting of deconditioning. Follow General reflux precautions at all meals (remain upright 60 minutes after  meal).   Medication Administration: Crushed with puree    Other  Recommendations Oral Care Recommendations: Oral care before and after PO;Oral care BID    Recommendations for follow up therapy are one component of a multi-disciplinary discharge planning process, led by the attending physician.  Recommendations may be updated based on patient status, additional functional criteria and insurance authorization.  Follow up Recommendations Follow physician's recommendations for discharge plan and follow up therapies      Assistance Recommended at Discharge  PRN  Functional Status Assessment Patient has had a recent decline in their functional status and demonstrates the ability to make significant improvements in function in a reasonable and predictable amount of time.  Frequency and Duration min 2x/week  2 weeks       Prognosis Prognosis for improved oropharyngeal function: Fair Barriers to Reach Goals: Severity of deficits      Swallow Study   General Date of Onset: 06/01/22 HPI: Per H&P, pt is a 89 y.o. female with medical history significant of rheumatoid arthritis, collagen vascular disease, hypertension, hyperlipidemia, hypothyroidism, GERD, aneurysm of the ascending aorta status post stent placement presenting with acute respiratory failure with hypoxia, UTI.  Patient noted to have been evaluated in the ER yesterday with concern for recurrent falls and recurrent UTI.  Patient noted to have completed a recent 2-week course of antibiotics for urinary tract infection  with patient developing worsening weakness and falls at home.  Patient lived independently prior to this.  Patient was placed in ER observation with plan for SNF placement and patient being placed on a course of Macrobid for UTI coverage.  Patient noted to have worsening leukocytosis with evaluation in the ER including a CT being obtained that showed no obvious signs of infection.  Macrobid was continued patient's home  medications were added on.  Per report, patient had acute hypoxic episode earlier in the morning after a possible event of aspiration where nursing noticed a lot of coughed up fluid on the patient's gown.  Patient apparently unable to tolerate liquids per report.  At present, patient is mildly confused but denies any active issues including chest pain or shortness of breath.  Does have some chronic left hip pain.  No abdominal pain.  Currently in the ER with a Tmax 100.1, heart rate in the 50s 60s, BP 120s to 130s over 6100s.  Requiring 3 L nasal cannula to keep O2 sats greater than 98%.  White count 22.3-18.2.  Hemoglobin 11.4.  Creatinine 0.61.  CTA chest yesterday with small black bilateral pleural effusions with associated bibasilar atelectasis.  No overt infiltrate.  Stable thoracic aortic aneurysm.  Follow-up chest x-ray with no substantial change. Type of Study: Bedside Swallow Evaluation Diet Prior to this Study: NPO Temperature Spikes Noted: No (WBC 18.2) Respiratory Status: Room air History of Recent Intubation: No Behavior/Cognition: Alert;Cooperative;Lethargic/Drowsy;Requires cueing Oral Cavity Assessment: Within Functional Limits Oral Care Completed by SLP: No Oral Cavity - Dentition: Adequate natural dentition Self-Feeding Abilities: Able to feed self;Needs assist Patient Positioning: Upright in bed Baseline Vocal Quality: Normal Volitional Cough:  (NT) Volitional Swallow: Able to elicit    Oral/Motor/Sensory Function Overall Oral Motor/Sensory Function: Within functional limits   Ice Chips Ice chips: Not tested   Thin Liquid Thin Liquid: Impaired Presentation: Cup (~ 1 cup sip) Oral Phase Impairments: Reduced lingual movement/coordination Pharyngeal  Phase Impairments: Multiple swallows;Cough - Immediate (reported "I can't swallow")    Nectar Thick Nectar Thick Liquid: Within functional limits Presentation: Cup;Self Fed (~ 3 oz)   Honey Thick Honey Thick Liquid: Not tested    Puree Puree: Within functional limits Presentation: Spoon (~ 2 oz)   Solid     Solid: Within functional limits Presentation: Self Fed (1 cracker)     Randall Hiss Graduate Clinician [place], Speech Pathology   Randall Hiss 06/03/2022,2:39 PM

## 2022-06-03 NOTE — H&P (Signed)
History and Physical    Patient: Gloria [name] [id number] DOB: 1925-12-30 DOA: 06/01/2022 DOS: the patient was seen and examined on 06/03/2022 PCP: Adin Hector, MD  Patient coming from: Home  Chief Complaint: No chief complaint on file.  HPI: Gloria [name] is a 89 y.o. female with medical history significant of rheumatoid arthritis, collagen vascular disease, hypertension, hyperlipidemia, hypothyroidism, GERD, aneurysm of the ascending aorta status post stent placement presenting with acute respiratory failure with hypoxia, UTI.  Patient noted to have been evaluated in the ER yesterday with concern for recurrent falls and recurrent UTI.  Patient noted to have completed a recent 2-week course of antibiotics for urinary tract infection with patient developing worsening weakness and falls at home.  Patient lived independently prior to this.  Patient was placed in ER observation with plan for SNF placement and patient being placed on a course of Macrobid for UTI coverage.  Patient noted to have worsening leukocytosis with evaluation in the ER including a CT being obtained that showed no obvious signs of infection.  Macrobid was continued patient's home medications were added on.  Per report, patient had acute hypoxic episode earlier in the morning after a possible event of aspiration where nursing noticed a lot of coughed up fluid on the patient's gown.  Patient apparently unable to tolerate liquids per report.  At present, patient is mildly confused but denies any active issues including chest pain or shortness of breath.  Does have some chronic left hip pain.  No abdominal pain. Currently in the ER with a Tmax 100.1, heart rate in the 50s 60s, BP 120s to 130s over 6100s.  Requiring 3 L nasal cannula to keep O2 sats greater than 98%.  White count 22.3-18.2.  Hemoglobin 11.4.  Creatinine 0.61.  CTA chest yesterday with small black bilateral pleural effusions with associated bibasilar  atelectasis.  No overt infiltrate.  Stable thoracic aortic aneurysm.  Follow-up chest x-ray with no substantial change.  Started on Rocephin and azithromycin in the ER. Review of Systems: unable to review all systems due to the inability of the patient to answer questions. Past Medical History:  Diagnosis Date   Arthritis    Basal cell carcinoma    Left chin, right nasolabial   Collagen vascular disease (HCC)    RA   Heartburn    Hypertension    Squamous cell carcinoma of skin 05/06/2021   left hand, treated with [facility]   Past Surgical History:  Procedure Laterality Date   ABDOMINAL HYSTERECTOMY     BREAST CYST EXCISION     HIP ARTHROPLASTY Right 11/30/2019   Procedure: ARTHROPLASTY BIPOLAR HIP (HEMIARTHROPLASTY);  Surgeon: Corky Mull, MD;  Location: [facility] ORS;  Service: Orthopedics;  Laterality: Right;   SACROPLASTY N/A 12/17/2020   Procedure: SACROPLASTY;  Surgeon: Hessie Knows, MD;  Location: [facility] ORS;  Service: Orthopedics;  Laterality: N/A;   THORACIC AORTIC ENDOVASCULAR STENT GRAFT N/A 04/11/2019   Procedure: THORACIC AORTIC ENDOVASCULAR STENT GRAFT insertion;  Surgeon: Serafina Mitchell, MD;  Location: [facility] OR;  Service: Vascular;  Laterality: N/A;   ULTRASOUND GUIDANCE FOR VASCULAR ACCESS Right 04/11/2019   Procedure: Ultrasound Guidance For Vascular Access, right femoral artery;  Surgeon: Serafina Mitchell, MD;  Location: [facility] OR;  Service: Vascular;  Laterality: Right;   Social History:  reports that she has never smoked. She has never used smokeless tobacco. She reports that she does not drink alcohol and does not use drugs.  Allergies  Allergen  Reactions   Amoxicillin Other (See Comments)    intolerant   Codeine Nausea Only   Ibandronate Sodium    Ibandronate Sodium  [Ibandronic Acid] Other (See Comments)    Gi upset   Risedronate Other (See Comments)    Gi upset   Risedronate Sodium    Sulfa Antibiotics Hives    Family History  Problem Relation Age of Onset   Kidney  disease Neg Hx    Bladder Cancer Neg Hx     Prior to Admission medications   Medication Sig Start Date End Date Taking? Authorizing Provider  amLODipine (NORVASC) 5 MG tablet Take 5 mg by mouth daily.   Yes [provider]  aspirin EC 81 MG tablet Take 81 mg by mouth daily. Swallow whole.   Yes [provider]  atorvastatin (LIPITOR) 20 MG tablet Take 20 mg by mouth daily.   Yes [provider]  carvedilol (COREG) 6.25 MG tablet Take 6.25 mg by mouth 2 (two) times daily with a meal.   Yes [provider]  folic acid (FOLVITE) 1 MG tablet Take 1 mg by mouth daily.   Yes [provider]  levothyroxine (SYNTHROID, LEVOTHROID) 75 MCG tablet Take 75 mcg by mouth daily.  02/01/14  Yes [provider]  methotrexate (RHEUMATREX) 2.5 MG tablet Take 15 mg by mouth every Friday.   Yes [provider]  mirabegron ER (MYRBETRIQ) 25 MG TB24 tablet Take 25 mg by mouth daily.   Yes [provider]  Multiple Vitamin (MULTIVITAMIN WITH MINERALS) TABS tablet Take 1 tablet by mouth daily.   Yes [provider]  nitrofurantoin, macrocrystal-monohydrate, (MACROBID) 100 MG capsule Take 1 capsule (100 mg total) by mouth daily. Patient taking differently: Take 100 mg by mouth 2 (two) times daily. 05/18/22  Yes MacDiarmid, Nicki Reaper, MD  predniSONE (DELTASONE) 5 MG tablet Take 2.5 mg by mouth daily. 02/23/19  Yes [provider]  solifenacin (VESICARE) 10 MG tablet Take 1 tablet by mouth daily. 04/13/21  Yes [provider]  acetaminophen (TYLENOL) 325 MG tablet Take 2 tablets (650 mg total) by mouth every 4 (four) hours as needed for moderate pain. 12/03/19   Tawni Millers, MD    Physical Exam: Vitals:   06/02/22 0500 06/02/22 1625 06/03/22 1002 06/03/22 1100  BP: (!) 123/102 130/65 (!) 116/55   Pulse: (!) 56 61 64 65  Resp: '17 17 16   '$ Temp:  98.2 F (36.8 C) (!) 97.5 F (36.4 C) 100.1 F (37.8 C)  TempSrc:    Oral   SpO2: 92% 96% (!) 88% 98%  Weight:      Height:       Physical Exam Constitutional:      Comments: Underweight    HENT:     Head: Normocephalic.     Nose: Nose normal.     Mouth/Throat:     Mouth: Mucous membranes are dry.  Eyes:     Pupils: Pupils are equal, round, and reactive to light.  Cardiovascular:     Rate and Rhythm: Normal rate and regular rhythm.  Pulmonary:     Effort: Pulmonary effort is normal.  Abdominal:     General: Bowel sounds are normal.  Musculoskeletal:        General: Normal range of motion.  Skin:    General: Skin is warm.  Neurological:     General: No focal deficit present.  Psychiatric:        Mood and Affect: Mood normal.  Data Reviewed:  There are no new results to review at this time. DG Chest Portable 1 View CLINICAL DATA:  Possible aspiration / Hypoxic  EXAM: PORTABLE CHEST 1 VIEW  COMPARISON:  February 27, 24.  FINDINGS: No substantial change from the prior. No new confluent consolidation. Small bilateral pleural effusions and overlying bibasilar atelectasis better characterized on recent CT chest. No visible pneumothorax. Cardiomediastinal silhouette is unchanged with aortic stent.  IMPRESSION: No substantial change from the prior. No new confluent consolidation. Small bilateral pleural effusions and overlying bibasilar atelectasis better characterized on recent CT chest.  Electronically Signed   By: Margaretha Sheffield M.D.   On: 06/03/2022 10:40   Lab Results  Component Value Date   WBC 18.2 (H) 06/03/2022   HGB 11.4 (L) 06/03/2022   HCT 36.0 06/03/2022   MCV 100.6 (H) 06/03/2022   PLT 205 0000000   Last metabolic panel Lab Results  Component Value Date   GLUCOSE 144 (H) 06/03/2022   NA 135 06/03/2022   K 3.3 (L) 06/03/2022   CL 104 06/03/2022   CO2 22 06/03/2022   BUN 15 06/03/2022   CREATININE 0.61 06/03/2022   GFRNONAA >60 06/03/2022   CALCIUM 8.3 (L) 06/03/2022   PROT 6.1 (L) 06/03/2022    ALBUMIN 2.9 (L) 06/03/2022   BILITOT 1.5 (H) 06/03/2022   ALKPHOS 51 06/03/2022   AST 22 06/03/2022   ALT 13 06/03/2022   ANIONGAP 9 06/03/2022    Assessment and Plan: * Acute respiratory failure with hypoxia ([place]) New onset hypoxia with questionable aspiration event earlier in the day while patient is waiting for placement in the ER White count 18, borderline temp of 100.1 Chest x-ray grossly stable Placed on IV Rocephin and azithromycin for broad-spectrum coverage in the ER Add on flagyl for anaerobic coverage if there is any further decompensation in resp status.  Add on respiratory panel Repeat chest x-ray in the morning    Weakness Noted progressive weakness at home with multiple recurrent falls Noted to be living at home independently prior to admission Stable workup in the ER with plan for SNF placement Will formally consult PT OT while admitted Will continue plan for SNF placement Follow  Recurrent UTI Noted history recurrent UTIs with urine cultures February 2024 growing Klebsiella Recent completed outpatient course of antibiotics and started on Macrobid in the ER Urine cultures appear to have some resistance to Macrobid Currently on Rocephin in the setting of possible pneumonia Will continue pending repeat urine culture Follow  Leukocytosis White count 18-22 in the setting of UTI and now new onset hypoxia with concern for pneumonia Continue IV Rocephin and azithromycin for infectious coverage Pancultured in the ER  Trend  Essential hypertension BP stable  Titrate home regimen    Anemia Hgb stable at 11.4  Rheumatoid arthritis ([place]) Appears to have stable disease  Cont home methotrexate and low dose prednisone    Hypothyroidism Stable  Cont home synthroid        Advance Care Planning:   Code Status: Full Code   Consults: None  Family Communication: No family at the bedside   Severity of Illness: The appropriate patient status for this  patient is INPATIENT. Inpatient status is judged to be reasonable and necessary in order to provide the required intensity of service to ensure the patient's safety. The patient's presenting symptoms, physical exam findings, and initial radiographic and laboratory data in the context of their chronic comorbidities is felt to place them at high risk for  further clinical deterioration. Furthermore, it is not anticipated that the patient will be medically stable for discharge from the hospital within 2 midnights of admission.   * I certify that at the point of admission it is my clinical judgment that the patient will require inpatient hospital care spanning beyond 2 midnights from the point of admission due to high intensity of service, high risk for further deterioration and high frequency of surveillance required.*  Author: Deneise Lever, MD 06/03/2022 2:08 PM  For on call review [email or web address].

## 2022-06-03 NOTE — Progress Notes (Signed)
PT Cancellation Note  Patient Details Name: Gloria [name] MRN: [id number] DOB: 1926/03/19   Cancelled Treatment:    Reason Eval/Treat Not Completed: Other (comment) On arrival to room pt had dinner plate on her chest, nearly spilling gravy everywhere and fumbling with trying to get plastic wear out of their bag.  PT assisted with insuring she did not make a mess, helped with scooting up in bed for better eating position and opened utensils and milk carton.  Pt was not interested in doing any PT today "I've had a heck of a day" and pleasantly refused mobility/exercises.    Kreg Shropshire, DPT 06/03/2022, 5:54 PM

## 2022-06-03 NOTE — ED Provider Notes (Signed)
Patient currently boarding in the ER for possible SNF placement.  Notified by nurse that patient had become hypoxic this morning.  No significant increased work of breathing but was hypoxic to the mid 80s on room air improved on supplemental oxygen.  Of some concern for possible aspiration per nurse as she had a choking episode this morning while eating.  But found to be febrile 100.1.  Given her leukocytosis septic workup initiated and broad-spectrum antibiotics ordered for presumed pneumonia.  Her lactate is normal.  White count without significant change.  Repeat metabolic panel roughly at baseline.  Abdominal exam is soft benign.  Will consult hospitalist for admission.   Merlyn Lot, MD 06/03/22 [phone]

## 2022-06-03 NOTE — ED Notes (Signed)
Attempted 2 blood draws without success. Lab notified and will send down a phlebotomist

## 2022-06-03 NOTE — Evaluation (Signed)
Occupational Therapy Evaluation Patient Details Name: Gloria [name] MRN: [id number] DOB: 29-Apr-1925 Today's Date: 06/03/2022   History of Present Illness Pt is a 89 year old female admitted with Acute respiratory failure with hypoxia, weakness, UTI; PMH significant for rheumatoid arthritis, collagen vascular disease, hypertension, hyperlipidemia, hypothyroidism, GERD, aneurysm of the ascending aorta status post stent placement presenting with acute respiratory failure with hypoxia, UTI.   Clinical Impression   Chart reviewed, pt greeted in bed with daughter [name] present, agreeable to OT evaluation. Pt is alert, oriented to self and place, increased time required throughout for one step direction following. PTA pt lives at [place], generally MOD I for ADL, assist from aid as needed, assist for IADL. Pt and daughter both endorse pt is performing ADL/IADL/functional mobility below functional baseline.  Pt would benefit from additional skilled OT to address functional deficits and to facilitate return to PLOF. OT will contiue to follow acutely.    Recommendations for follow up therapy are one component of a multi-disciplinary discharge planning process, led by the attending physician.  Recommendations may be updated based on patient status, additional functional criteria and insurance authorization.   Follow Up Recommendations  Skilled nursing-short term rehab (<3 hours/day)     Assistance Recommended at Discharge Frequent or constant Supervision/Assistance  Patient can return home with the following A lot of help with walking and/or transfers;A lot of help with bathing/dressing/bathroom    Functional Status Assessment  Patient has had a recent decline in their functional status and demonstrates the ability to make significant improvements in function in a reasonable and predictable amount of time.  Equipment Recommendations  Other (comment) (per next venue of care)    Recommendations for  Other Services       Precautions / Restrictions Precautions Precautions: Fall Restrictions Weight Bearing Restrictions: No      Mobility Bed Mobility Overal bed mobility: Needs Assistance Bed Mobility: Supine to Sit, Sit to Supine     Supine to sit: Mod assist, Max assist Sit to supine: Mod assist, Max assist   General bed mobility comments: TOTAL A +2 for boost in bed    Transfers Overall transfer level: Needs assistance Equipment used: Rolling walker (2 wheels) Transfers: Sit to/from Stand Sit to Stand: Min assist           General transfer comment: pt unable to sustain static standing for more than 30 seconds with RW      Balance Overall balance assessment: Needs assistance Sitting-balance support: No upper extremity supported Sitting balance-Leahy Scale: Fair     Standing balance support: Single extremity supported, Bilateral upper extremity supported, No upper extremity supported, During functional activity Standing balance-Leahy Scale: Poor                             ADL either performed or assessed with clinical judgement   ADL Overall ADL's : Needs assistance/impaired     Grooming: Moderate assistance               Lower Body Dressing: Maximal assistance   Toilet Transfer: Moderate assistance Toilet Transfer Details (indicate cue type and reason): simualted with RW Toileting- Clothing Manipulation and Hygiene: Maximal assistance               Vision Patient Visual Report: No change from baseline       Perception     Praxis      Pertinent Vitals/Pain Pain Assessment Pain Assessment: Faces  Faces Pain Scale: Hurts little more Pain Location: back Pain Descriptors / Indicators: Grimacing Pain Intervention(s): Limited activity within patient's tolerance, Monitored during session, RN gave pain meds during session, Repositioned     Hand Dominance     Extremity/Trunk Assessment Upper Extremity Assessment Upper  Extremity Assessment: Generalized weakness   Lower Extremity Assessment Lower Extremity Assessment: Generalized weakness       Communication Communication Communication: No difficulties   Cognition Arousal/Alertness: Awake/alert Behavior During Therapy: Flat affect Overall Cognitive Status: Impaired/Different from baseline Area of Impairment: Orientation, Attention, Memory, Following commands, Awareness, Problem solving, Safety/judgement                 Orientation Level: Disoriented to, Time, Situation Current Attention Level: Focused Memory: Decreased short-term memory Following Commands: Follows one step commands with increased time Safety/Judgement: Decreased awareness of safety, Decreased awareness of deficits Awareness: Intellectual Problem Solving: Slow processing, Requires verbal cues, Requires tactile cues       General Comments  vitals monitored throughout, spo2 >95% on 3 L via [place]    Exercises Other Exercises Other Exercises: edu pt and daugther re: role of OT, role of rehab, discharge recommendations, home safety, falls prevention, DME use   Shoulder Instructions      Home Living Family/patient expects to be discharged to:: Private residence (ILF at [facility]) Living Arrangements: Alone Available Help at Discharge: Family;Available PRN/intermittently (has an aid a few times a week 1 hr per day) Type of Home: Independent living facility Home Access: Level entry     Home Layout: One level     Bathroom Shower/Tub: Astronomer Accessibility: Yes   Home Equipment: Rollator (4 wheels);Cane - single point          Prior Functioning/Environment Prior Level of Function : Independent/Modified Independent             Mobility Comments: amb with no AD, fall history, has used a rollator recently ADLs Comments: feeding, dressing, grooming with MOD I generally, assist as needed from aid; supervision for shower transfers; assist for  IADL as needed        OT Problem List: Decreased strength;Decreased activity tolerance;Impaired balance (sitting and/or standing);Decreased safety awareness;Decreased cognition;Decreased knowledge of use of DME or AE      OT Treatment/Interventions: Self-care/ADL training;Balance training;Therapeutic exercise;Therapeutic activities;Energy conservation;DME and/or AE instruction;Cognitive remediation/compensation;Patient/family education    OT Goals(Current goals can be found in the care plan section) Acute Rehab OT Goals Patient Stated Goal: go back home OT Goal Formulation: With patient/family Time For Goal Achievement: 06/17/22 Potential to Achieve Goals: Good ADL Goals Pt Will Perform Grooming: sitting;with set-up Pt Will Perform Lower Body Dressing: with min assist Pt Will Transfer to Toilet: with min assist Pt Will Perform Toileting - Clothing Manipulation and hygiene: with min assist  OT Frequency: Min 2X/week    Co-evaluation              AM-PAC OT "6 Clicks" Daily Activity     Outcome Measure Help from another person eating meals?: A Lot Help from another person taking care of personal grooming?: A Lot Help from another person toileting, which includes using toliet, bedpan, or urinal?: A Lot Help from another person bathing (including washing, rinsing, drying)?: A Lot Help from another person to put on and taking off regular upper body clothing?: A Lot Help from another person to put on and taking off regular lower body clothing?: A Lot 6 Click Score: 12   End  of Session Equipment Utilized During Treatment: Rolling walker (2 wheels);Oxygen Nurse Communication: Mobility status  Activity Tolerance: Patient tolerated treatment well Patient left: in bed;with call bell/phone within reach;with chair alarm set  OT Visit Diagnosis: Unsteadiness on feet (R26.81);Muscle weakness (generalized) (M62.81);Other abnormalities of gait and mobility (R26.89);History of falling  (Z91.81)                Time: [id number] OT Time Calculation (min): 20 min Charges:  OT General Charges $OT Visit: 1 Visit OT Evaluation $OT Eval Moderate Complexity: 1 Mod Shanon Payor, OTD OTR/L  06/03/22, 4:11 PM

## 2022-06-03 NOTE — Assessment & Plan Note (Signed)
New onset hypoxia with questionable aspiration event earlier in the day while patient is waiting for placement in the ER White count 18, borderline temp of 100.1 Chest x-ray grossly stable Placed on IV Rocephin and azithromycin for broad-spectrum coverage in the ER Add on flagyl for anaerobic coverage if there is any further decompensation in resp status.  Add on respiratory panel Repeat chest x-ray in the morning

## 2022-06-03 NOTE — ED Notes (Signed)
Informed RN bed assigned 

## 2022-06-03 NOTE — Assessment & Plan Note (Signed)
Stable  Cont home synthroid

## 2022-06-04 ENCOUNTER — Telehealth: Payer: Self-pay | Admitting: Urology

## 2022-06-04 ENCOUNTER — Inpatient Hospital Stay: Payer: Medicare PPO

## 2022-06-04 DIAGNOSIS — E440 Moderate protein-calorie malnutrition: Secondary | ICD-10-CM | POA: Insufficient documentation

## 2022-06-04 DIAGNOSIS — J9601 Acute respiratory failure with hypoxia: Secondary | ICD-10-CM | POA: Diagnosis not present

## 2022-06-04 LAB — COMPREHENSIVE METABOLIC PANEL
ALT: 10 U/L (ref 0–44)
AST: 17 U/L (ref 15–41)
Albumin: 2.4 g/dL — ABNORMAL LOW (ref 3.5–5.0)
Alkaline Phosphatase: 43 U/L (ref 38–126)
Anion gap: 6 (ref 5–15)
BUN: 20 mg/dL (ref 8–23)
CO2: 23 mmol/L (ref 22–32)
Calcium: 7.9 mg/dL — ABNORMAL LOW (ref 8.9–10.3)
Chloride: 107 mmol/L (ref 98–111)
Creatinine, Ser: 0.62 mg/dL (ref 0.44–1.00)
GFR, Estimated: >60 mL/min (ref >60)
Glucose, Bld: 102 mg/dL — ABNORMAL HIGH (ref 70–99)
Potassium: 3.6 mmol/L (ref 3.5–5.1)
Sodium: 136 mmol/L (ref 135–145)
Total Bilirubin: 1.3 mg/dL — ABNORMAL HIGH (ref 0.3–1.2)
Total Protein: 5.2 g/dL — ABNORMAL LOW (ref 6.5–8.1)

## 2022-06-04 LAB — CBC
HCT: 30.9 % — ABNORMAL LOW (ref 36.0–46.0)
Hemoglobin: 10 g/dL — ABNORMAL LOW (ref 12.0–15.0)
MCH: 31.4 pg (ref 26.0–34.0)
MCHC: 32.4 g/dL (ref 30.0–36.0)
MCV: 97.2 fL (ref 80.0–100.0)
Platelets: 203 10*3/uL (ref 150–400)
RBC: 3.18 MIL/uL — ABNORMAL LOW (ref 3.87–5.11)
RDW: 15.9 % — ABNORMAL HIGH (ref 11.5–15.5)
WBC: 16.1 10*3/uL — ABNORMAL HIGH (ref 4.0–10.5)
nRBC: 0 % (ref 0.0–0.2)

## 2022-06-04 LAB — RESPIRATORY PANEL BY PCR

## 2022-06-04 LAB — URINALYSIS, COMPLETE (UACMP) WITH MICROSCOPIC
Bacteria, UA: NONE SEEN
Bilirubin Urine: NEGATIVE
Glucose, UA: NEGATIVE mg/dL
Hgb urine dipstick: NEGATIVE
Ketones, ur: NEGATIVE mg/dL
Leukocytes,Ua: NEGATIVE
Nitrite: NEGATIVE
Protein, ur: NEGATIVE mg/dL
Specific Gravity, Urine: 1.018 (ref 1.005–1.030)
Squamous Epithelial / HPF: NONE SEEN /HPF (ref 0–5)
pH: 6 (ref 5.0–8.0)

## 2022-06-04 LAB — PROCALCITONIN: Procalcitonin: 0.18 ng/mL

## 2022-06-04 LAB — BRAIN NATRIURETIC PEPTIDE: B Natriuretic Peptide: 60 pg/mL (ref 0.0–100.0)

## 2022-06-04 LAB — D-DIMER, QUANTITATIVE: D-Dimer, Quant: 3.48 ug/mL-FEU — ABNORMAL HIGH (ref 0.00–0.50)

## 2022-06-04 LAB — GLUCOSE, CAPILLARY: Glucose-Capillary: 121 mg/dL — ABNORMAL HIGH (ref 70–99)

## 2022-06-04 MED ORDER — NEPRO/CARBSTEADY PO LIQD
237.0000 mL | Freq: Three times a day (TID) | ORAL | Status: DC
  Administered 2022-06-04 – 2022-06-06 (×7): 237 mL via ORAL

## 2022-06-04 MED ORDER — SODIUM CHLORIDE 0.9 % IV SOLN
1.5000 g | Freq: Four times a day (QID) | INTRAVENOUS | Status: DC
  Administered 2022-06-04 – 2022-06-05 (×4): 1.5 g via INTRAVENOUS
  Filled 2022-06-04: qty 1.5
  Filled 2022-06-04: qty 4
  Filled 2022-06-04: qty 1.5
  Filled 2022-06-04: qty 4
  Filled 2022-06-04: qty 1.5
  Filled 2022-06-04: qty 4
  Filled 2022-06-04: qty 1.5

## 2022-06-04 MED ORDER — IOHEXOL 350 MG/ML SOLN
75.0000 mL | Freq: Once | INTRAVENOUS | Status: AC | PRN
  Administered 2022-06-04: 75 mL via INTRAVENOUS

## 2022-06-04 NOTE — Consult Note (Signed)
Pharmacy Antibiotic Note  Gloria [name] is a 89 y.o. female admitted on 06/01/2022 with acute respiratory failure.  Pharmacy has been consulted for Unasyn dosing for aspiration pneumonia.  Plan: Start Unasyn 1.5 grams IV every 6 hours Follow renal function and cultures for needed adjustments  Height: '5\' 7"'$  (170.2 cm) Weight: 68.5 kg (151 lb 0.2 oz) IBW/kg (Calculated) : 61.6  Temp (24hrs), Avg:98.9 F (37.2 C), Min:97.6 F (36.4 C), Max:100 F (37.8 C)  Recent Labs  Lab 06/01/22 1238 06/03/22 1117 06/03/22 1818 06/03/22 1819 06/04/22 0416  WBC 22.3* 18.2*  --  20.2* 16.1*  CREATININE 0.78 0.61  --   --  0.62  LATICACIDVEN  --  1.5 1.5  --   --     Estimated Creatinine Clearance: 41.8 mL/min (by C-G formula based on SCr of 0.62 mg/dL).    Allergies  Allergen Reactions   Amoxicillin Other (See Comments)    Intolerant  Daughter and Son report patient tolerated course of Augmentin in January 2024   Codeine Nausea Only   Ibandronate Sodium    Ibandronate Sodium  [Ibandronic Acid] Other (See Comments)    Gi upset   Risedronate Other (See Comments)    Gi upset   Risedronate Sodium    Sulfa Antibiotics Hives    Antimicrobials this admission: Ceftriaxone 2/28 >> 3/1 Azithromycin 2/28 >> 3/1 Unasyn 3/1>> Dose adjustments this admission: N/A  Microbiology results: 2/29 BCx: ngtd 3/1 UCx: pending   Thank you for allowing pharmacy to be a part of this patient's care.  Lorin Picket, PharmD 06/04/2022 11:59 AM

## 2022-06-04 NOTE — Progress Notes (Signed)
Initial Nutrition Assessment  DOCUMENTATION CODES:   Non-severe (moderate) malnutrition in context of chronic illness  INTERVENTION:   -Nepro Shake po TID, each supplement provides 425 kcal and 19 grams protein  -Magic cup TID with meals, each supplement provides 290 kcal and 9 grams of protein  -MVI with minerals daily  NUTRITION DIAGNOSIS:   Moderate Malnutrition related to chronic illness (RA) as evidenced by mild fat depletion, moderate fat depletion, mild muscle depletion, moderate muscle depletion.  GOAL:   Patient will meet greater than or equal to 90% of their needs  MONITOR:   PO intake, Supplement acceptance  REASON FOR ASSESSMENT:   Malnutrition Screening Tool    ASSESSMENT:   Pt with medical history significant of rheumatoid arthritis, collagen vascular disease, hypertension, hyperlipidemia, hypothyroidism, GERD, aneurysm of the ascending aorta status post stent placement presenting with acute respiratory failure with hypoxia, UTI.  Pt admitted with acute respiratory failure with hypoxia with questionable aspiration event, weakness, and recurrent UTI.   Reviewed I/O's: +355 ml x 24 hours  Case discussed with SLP; pt was evaluated yesterday and was recommended a dysphagia 3 diet with nectar thick liquids. Diet order modified. RN aware of updated diet order.   Spoke with pt at bedside. Pt reports she slept well last night, but with confusion. Pt thinks she is at [facility] in [place]; RD attempted to redirect but had to continue to reorient pt at time of visit. Pt continued to ask questions about where she was and how she got here. She even asked RD "are you sure you have the right pt". RD showed pt her ID bracelet and confirmed that her identifying informations was correct. Comfort and emotional support provided.   When asked if she had a  good appetite, pt replied "I think I do". Pt reports she has not eaten breakfast this morning, but ate yesterday.  Pt reports she usually consumes 3 meals per day (Breakfast: cereal; Lunch and Dinner: "whatever I have"). Pt denies any difficulty chewing or swallowing foods PTA.   Pt is unsure of UBW. She thinks she may have lost weight, but "that is not something I keep up with". Reviewed wt hx; wt has been stable over the past month.   Discussed importance of good meal and supplement intake to promote healing. Pt amenable to supplements.   Medications reviewed and include folic acid and prednisone.  No results found for: "HGBA1C" PTA DM medications are none.   Labs reviewed: CBGS: 121 (inpatient orders for glycemic control are none).    Diet Order:   Diet Order             DIET DYS 3 Room service appropriate? Yes with Assist; Fluid consistency: Nectar Thick  Diet effective now                   EDUCATION NEEDS:   Education needs have been addressed  Skin:  Skin Assessment: Reviewed RN Assessment  Last BM:  Unknown  Height:   Ht Readings from Last 1 Encounters:  06/01/22 '5\' 7"'$  (1.702 m)    Weight:   Wt Readings from Last 1 Encounters:  06/01/22 68.5 kg    Ideal Body Weight:  61.4 kg  BMI:  Body mass index is 23.65 kg/m.  Estimated Nutritional Needs:   Kcal:  1650-1850  Protein:  80-95 grams  Fluid:  > 1.6 L    Loistine Chance, RD, LDN, [place] Registered Dietitian II Certified Diabetes Care and Education Specialist Please  refer to [facility] for RD and/or RD on-call/weekend/after hours pager

## 2022-06-04 NOTE — Telephone Encounter (Signed)
FYI - Pt daughter stopped in office to let you know that pt is admitted to [facility].

## 2022-06-04 NOTE — TOC Progression Note (Signed)
Transition of Care [facility] - Progression Note    Patient Details  Name: Gloria [name] MRN: [id number] Date of Birth: [birth year]  Transition of Care [facility]) CM/SW Contact  Gerilyn Pilgrim, LCSW Phone Number: 06/04/2022, 11:26 AM  Clinical Narrative:   Pt auth approved for [facility], pt not medically ready. Per [facility], pt can admit over weekend.    Expected Discharge Plan: Skilled Nursing Facility Barriers to Discharge: Continued Medical Work up  Expected Discharge Plan and Services       Living arrangements for the past 2 months: [place] ([facility])                                       Social Determinants of Health (SDOH) Interventions SDOH Screenings   Food Insecurity: No Food Insecurity (06/03/2022)  Housing: Low Risk  (06/03/2022)  Transportation Needs: No Transportation Needs (06/03/2022)  Utilities: Not At Risk (06/03/2022)  Tobacco Use: Low Risk  (06/03/2022)    Readmission Risk Interventions     No data to display

## 2022-06-04 NOTE — Plan of Care (Signed)
  Problem: Education: Goal: Knowledge of General Education information will improve Description: Including pain rating scale, medication(s)/side effects and non-pharmacologic comfort measures Outcome: Progressing   Problem: Health Behavior/Discharge Planning: Goal: Ability to manage health-related needs will improve Outcome: Progressing   Problem: Activity: Goal: Risk for activity intolerance will decrease Outcome: Progressing   Problem: Nutrition: Goal: Adequate nutrition will be maintained Outcome: Progressing   Problem: Coping: Goal: Level of anxiety will decrease Outcome: Progressing   Problem: Elimination: Goal: Will not experience complications related to bowel motility Outcome: Progressing   Problem: Pain Managment: Goal: General experience of comfort will improve Outcome: Progressing   Problem: Safety: Goal: Ability to remain free from injury will improve Outcome: Progressing

## 2022-06-04 NOTE — Progress Notes (Addendum)
PROGRESS NOTE    Gloria [name]  [id number] DOB: 1925/10/20 DOA: 06/01/2022 PCP: Adin Hector, MD  Outpatient Specialists: rheum    Brief Narrative:   From admission h and p  Gloria [name] [name] is a 89 y.o. female with medical history significant of rheumatoid arthritis, collagen vascular disease, hypertension, hyperlipidemia, hypothyroidism, GERD, aneurysm of the ascending aorta status post stent placement presenting with acute respiratory failure with hypoxia, UTI.  Patient noted to have been evaluated in the ER yesterday with concern for recurrent falls and recurrent UTI.  Patient noted to have completed a recent 2-week course of antibiotics for urinary tract infection with patient developing worsening weakness and falls at home.  Patient lived independently prior to this.  Patient was placed in ER observation with plan for SNF placement and patient being placed on a course of Macrobid for UTI coverage.  Patient noted to have worsening leukocytosis with evaluation in the ER including a CT being obtained that showed no obvious signs of infection.  Macrobid was continued patient's home medications were added on.  Per report, patient had acute hypoxic episode earlier in the morning after a possible event of aspiration where nursing noticed a lot of coughed up fluid on the patient's gown.  Patient apparently unable to tolerate liquids per report.  At present, patient is mildly confused but denies any active issues including chest pain or shortness of breath.  Does have some chronic left hip pain.  No abdominal pain.   Assessment & Plan:   Principal Problem:   Acute respiratory failure with hypoxia (HCC) Active Problems:   Recurrent UTI   Weakness   Leukocytosis   Hypothyroidism   Rheumatoid arthritis (HCC)   Anemia   Essential hypertension   Peripheral vascular disease (HCC)   Malnutrition of moderate degree  # Delirium MRI nothing acute. Possible infection as below. Daughter  thinks improving  # Hypoxic respiratory failure # Possible aspiration pneumonia # Pleural effusions Possible aspiration event in the ED. Does have new hypoxia, 89% today on room air. Is breathing comfortably. CXR after possible aspiration no new consolidation. Does have small pleural effusions. Covid/flu/rsv neg. Dimer markedly elevated - will change abx to unasyn - IR says effusions are too small to tap, unlikely to be contributing to symptoms - f/u bnp, resp viral panel, blood cultures - continue O2 - CTA - seen by SLP, cleared for dysphagia 3 diet  # Recurrent UTI # Urinary retention? Urinalysis in the ED not suggestive of infection but now with AMS. Followed outpt by urology. 500 cc in bladder on today's I/o cath - will repeat ua with culture - hold myrbetriq - bladder scan q shift  # Subacute compression fracture L1, seen on CT. Recent fall. Not complaining of pain. Characterized as mild - monitor - for SNF, has a bed - will repeat UA with culture  # RA - cont home prednisone - hold home methotrexate for now, with folic acid  # Hypothyroid - cont home synthroid  # HTN Here bp low normal - hold home amlod - cont home coreg, statin - will hold asa as don't see a strong indication  # Dementia No behavioral disturbance    DVT prophylaxis: lovenox Code Status: DNR, confirmed with daughter, will place order Family Communication: daughter updated @ bedside 3/1  Level of care: Med-Surg Status is: Inpatient Remains inpatient appropriate because: severity of illness    Consultants:  none  Procedures: none  Antimicrobials:  Ceftriaxone/azith>unasyn  Subjective: No complaints  Objective: Vitals:   06/04/22 0010 06/04/22 0554 06/04/22 0819 06/04/22 1122  BP: (!) 106/40 (!) 109/51 (!) 129/58 (!) 106/55  Pulse: 69 65 64 63  Resp: '19 18 16 18  '$ Temp: 97.6 F (36.4 C) 98.7 F (37.1 C) 99.4 F (37.4 C) 99 F (37.2 C)  TempSrc:    Oral  SpO2: 97% 96%  99% 99%  Weight:      Height:        Intake/Output Summary (Last 24 hours) at 06/04/2022 1127 Last data filed at 06/03/2022 1654 Gross per 24 hour  Intake 354.98 ml  Output --  Net 354.98 ml   Filed Weights   06/01/22 1240  Weight: 68.5 kg    Examination:  General exam: Appears calm and comfortable  Respiratory system: Clear to auscultation save for rales at bases Cardiovascular system: S1 & S2 heard, RRR. Systolic murmur Gastrointestinal system: Abdomen is nondistended, soft and nontender. No organomegaly or masses felt. Normal bowel sounds heard. Central nervous system: Alert and oriented to self Extremities: Symmetric 5 x 5 power. Skin: No rashes, lesions or ulcers Psychiatry: calm    Data Reviewed: I have personally reviewed following labs and imaging studies  CBC: Recent Labs  Lab 06/01/22 1238 06/03/22 1117 06/03/22 1819 06/04/22 0416  WBC 22.3* 18.2* 20.2* 16.1*  NEUTROABS 19.4* 15.1*  --   --   HGB 11.5* 11.4* 11.3* 10.0*  HCT 35.9* 36.0 34.1* 30.9*  MCV 98.6 100.6* 95.8 97.2  PLT 232 205 223 [id number]   Basic Metabolic Panel: Recent Labs  Lab 06/01/22 1238 06/03/22 1117 06/03/22 1818 06/04/22 0416  NA 134* 135  --  136  K 3.7 3.3*  --  3.6  CL 100 104  --  107  CO2 28 22  --  23  GLUCOSE 149* 144*  --  102*  BUN 12 15  --  20  CREATININE 0.78 0.61  --  0.62  CALCIUM 8.5* 8.3*  --  7.9*  MG  --   --  2.1  --    GFR: Estimated Creatinine Clearance: 41.8 mL/min (by C-G formula based on SCr of 0.62 mg/dL). Liver Function Tests: Recent Labs  Lab 06/01/22 1238 06/03/22 1117 06/04/22 0416  AST '25 22 17  '$ ALT '16 13 10  '$ ALKPHOS 60 51 43  BILITOT 1.3* 1.5* 1.3*  PROT 7.0 6.1* 5.2*  ALBUMIN 3.5 2.9* 2.4*   No results for input(s): "LIPASE", "AMYLASE" in the last 168 hours. No results for input(s): "AMMONIA" in the last 168 hours. Coagulation Profile: No results for input(s): "INR", "PROTIME" in the last 168 hours. Cardiac Enzymes: No results for  input(s): "CKTOTAL", "CKMB", "CKMBINDEX", "TROPONINI" in the last 168 hours. BNP (last 3 results) No results for input(s): "PROBNP" in the last 8760 hours. HbA1C: No results for input(s): "HGBA1C" in the last 72 hours. CBG: Recent Labs  Lab 06/04/22 0745  GLUCAP 121*   Lipid Profile: No results for input(s): "CHOL", "HDL", "LDLCALC", "TRIG", "CHOLHDL", "LDLDIRECT" in the last 72 hours. Thyroid Function Tests: No results for input(s): "TSH", "T4TOTAL", "FREET4", "T3FREE", "THYROIDAB" in the last 72 hours. Anemia Panel: No results for input(s): "VITAMINB12", "FOLATE", "FERRITIN", "TIBC", "IRON", "RETICCTPCT" in the last 72 hours. Urine analysis:    Component Value Date/Time   COLORURINE YELLOW (A) 06/01/2022 1519   APPEARANCEUR CLEAR (A) 06/01/2022 1519   APPEARANCEUR Cloudy (A) 05/17/2022 1317   LABSPEC 1.008 06/01/2022 1519   PHURINE 7.0 06/01/2022 1519   GLUCOSEU  NEGATIVE 06/01/2022 1519   HGBUR NEGATIVE 06/01/2022 1519   BILIRUBINUR NEGATIVE 06/01/2022 1519   BILIRUBINUR Negative 05/17/2022 [place] 06/01/2022 1519   PROTEINUR NEGATIVE 06/01/2022 1519   NITRITE NEGATIVE 06/01/2022 1519   LEUKOCYTESUR NEGATIVE 06/01/2022 1519   Sepsis Labs: '@LABRCNTIP'$ (procalcitonin:4,lacticidven:4)  ) Recent Results (from the past 240 hour(s))  Resp panel by RT-PCR (RSV, Flu A&B, Covid) Anterior Nasal Swab     Status: None   Collection Time: 06/01/22  8:09 PM   Specimen: Anterior Nasal Swab  Result Value Ref Range Status   SARS Coronavirus 2 by RT PCR NEGATIVE NEGATIVE Final    Comment: (NOTE) SARS-CoV-2 target nucleic acids are NOT DETECTED.  The SARS-CoV-2 RNA is generally detectable in upper respiratory specimens during the acute phase of infection. The lowest concentration of SARS-CoV-2 viral copies this assay can detect is 138 copies/mL. A negative result does not preclude SARS-Cov-2 infection and should not be used as the sole basis for treatment or other  patient management decisions. A negative result may occur with  improper specimen collection/handling, submission of specimen other than nasopharyngeal swab, presence of viral mutation(s) within the areas targeted by this assay, and inadequate number of viral copies(<138 copies/mL). A negative result must be combined with clinical observations, patient history, and epidemiological information. The expected result is Negative.  Fact Sheet for Patients:  EntrepreneurPulse.com.au  Fact Sheet for Healthcare Providers:  IncredibleEmployment.be  This test is no t yet approved or cleared by the [place] FDA and  has been authorized for detection and/or diagnosis of SARS-CoV-2 by FDA under an Emergency Use Authorization (EUA). This EUA will remain  in effect (meaning this test can be used) for the duration of the COVID-19 declaration under Section 564(b)(1) of the Act, 21 U.S.C.section 360bbb-3(b)(1), unless the authorization is terminated  or revoked sooner.       Influenza A by PCR NEGATIVE NEGATIVE Final   Influenza B by PCR NEGATIVE NEGATIVE Final    Comment: (NOTE) The Xpert Xpress SARS-CoV-2/FLU/RSV plus assay is intended as an aid in the diagnosis of influenza from Nasopharyngeal swab specimens and should not be used as a sole basis for treatment. Nasal washings and aspirates are unacceptable for Xpert Xpress SARS-CoV-2/FLU/RSV testing.  Fact Sheet for Patients: EntrepreneurPulse.com.au  Fact Sheet for Healthcare Providers: IncredibleEmployment.be  This test is not yet approved or cleared by the [place] FDA and has been authorized for detection and/or diagnosis of SARS-CoV-2 by FDA under an Emergency Use Authorization (EUA). This EUA will remain in effect (meaning this test can be used) for the duration of the COVID-19 declaration under Section 564(b)(1) of the Act, 21 U.S.C. section  360bbb-3(b)(1), unless the authorization is terminated or revoked.     Resp Syncytial Virus by PCR NEGATIVE NEGATIVE Final    Comment: (NOTE) Fact Sheet for Patients: EntrepreneurPulse.com.au  Fact Sheet for Healthcare Providers: IncredibleEmployment.be  This test is not yet approved or cleared by the [place] FDA and has been authorized for detection and/or diagnosis of SARS-CoV-2 by FDA under an Emergency Use Authorization (EUA). This EUA will remain in effect (meaning this test can be used) for the duration of the COVID-19 declaration under Section 564(b)(1) of the Act, 21 U.S.C. section 360bbb-3(b)(1), unless the authorization is terminated or revoked.  Performed at [facility], [address] [place], [place] [address]   Resp panel by RT-PCR (RSV, Flu A&B, Covid) Anterior Nasal Swab     Status: None   Collection  Time: 06/03/22 11:17 AM   Specimen: Anterior Nasal Swab  Result Value Ref Range Status   SARS Coronavirus 2 by RT PCR NEGATIVE NEGATIVE Final    Comment: (NOTE) SARS-CoV-2 target nucleic acids are NOT DETECTED.  The SARS-CoV-2 RNA is generally detectable in upper respiratory specimens during the acute phase of infection. The lowest concentration of SARS-CoV-2 viral copies this assay can detect is 138 copies/mL. A negative result does not preclude SARS-Cov-2 infection and should not be used as the sole basis for treatment or other patient management decisions. A negative result may occur with  improper specimen collection/handling, submission of specimen other than nasopharyngeal swab, presence of viral mutation(s) within the areas targeted by this assay, and inadequate number of viral copies(<138 copies/mL). A negative result must be combined with clinical observations, patient history, and epidemiological information. The expected result is Negative.  Fact Sheet for Patients:   EntrepreneurPulse.com.au  Fact Sheet for Healthcare Providers:  IncredibleEmployment.be  This test is no t yet approved or cleared by the [place] FDA and  has been authorized for detection and/or diagnosis of SARS-CoV-2 by FDA under an Emergency Use Authorization (EUA). This EUA will remain  in effect (meaning this test can be used) for the duration of the COVID-19 declaration under Section 564(b)(1) of the Act, 21 U.S.C.section 360bbb-3(b)(1), unless the authorization is terminated  or revoked sooner.       Influenza A by PCR NEGATIVE NEGATIVE Final   Influenza B by PCR NEGATIVE NEGATIVE Final    Comment: (NOTE) The Xpert Xpress SARS-CoV-2/FLU/RSV plus assay is intended as an aid in the diagnosis of influenza from Nasopharyngeal swab specimens and should not be used as a sole basis for treatment. Nasal washings and aspirates are unacceptable for Xpert Xpress SARS-CoV-2/FLU/RSV testing.  Fact Sheet for Patients: EntrepreneurPulse.com.au  Fact Sheet for Healthcare Providers: IncredibleEmployment.be  This test is not yet approved or cleared by the [place] FDA and has been authorized for detection and/or diagnosis of SARS-CoV-2 by FDA under an Emergency Use Authorization (EUA). This EUA will remain in effect (meaning this test can be used) for the duration of the COVID-19 declaration under Section 564(b)(1) of the Act, 21 U.S.C. section 360bbb-3(b)(1), unless the authorization is terminated or revoked.     Resp Syncytial Virus by PCR NEGATIVE NEGATIVE Final    Comment: (NOTE) Fact Sheet for Patients: EntrepreneurPulse.com.au  Fact Sheet for Healthcare Providers: IncredibleEmployment.be  This test is not yet approved or cleared by the [place] FDA and has been authorized for detection and/or diagnosis of SARS-CoV-2 by FDA under an Emergency Use  Authorization (EUA). This EUA will remain in effect (meaning this test can be used) for the duration of the COVID-19 declaration under Section 564(b)(1) of the Act, 21 U.S.C. section 360bbb-3(b)(1), unless the authorization is terminated or revoked.  Performed at [facility], [address] [place], [place] [address]   Blood Culture (routine x 2)     Status: None (Preliminary result)   Collection Time: 06/03/22 11:17 AM   Specimen: BLOOD  Result Value Ref Range Status   Specimen Description BLOOD  LEFT AC  Final   Special Requests   Final    BOTTLES DRAWN AEROBIC AND ANAEROBIC Blood Culture results may not be optimal due to an inadequate volume of blood received in culture bottles   Culture   Final    NO GROWTH < 24 HOURS Performed at [facility], [address], [place], [place] [address]  Report Status PENDING  Incomplete  Blood Culture (routine x 2)     Status: None (Preliminary result)   Collection Time: 06/03/22 11:17 AM   Specimen: BLOOD  Result Value Ref Range Status   Specimen Description BLOOD  LEFT HAND  Final   Special Requests   Final    BOTTLES DRAWN AEROBIC AND ANAEROBIC Blood Culture results may not be optimal due to an inadequate volume of blood received in culture bottles   Culture   Final    NO GROWTH < 24 HOURS Performed at [facility], [address], [place], [place] [address]    Report Status PENDING  Incomplete         Radiology Studies: DG Chest Portable 1 View  Result Date: 06/03/2022 CLINICAL DATA:  Possible aspiration / Hypoxic EXAM: PORTABLE CHEST 1 VIEW COMPARISON:  February 27, 24. FINDINGS: No substantial change from the prior. No new confluent consolidation. Small bilateral pleural effusions and overlying bibasilar atelectasis better characterized on recent CT chest. No visible pneumothorax. Cardiomediastinal silhouette is unchanged with aortic stent. IMPRESSION: No substantial change from the prior. No new  confluent consolidation. Small bilateral pleural effusions and overlying bibasilar atelectasis better characterized on recent CT chest. Electronically Signed   By: Margaretha Sheffield M.D.   On: 06/03/2022 10:40        Scheduled Meds:  aspirin EC  81 mg Oral Daily   atorvastatin  20 mg Oral Daily   carvedilol  6.25 mg Oral BID WC   enoxaparin (LOVENOX) injection  40 mg Subcutaneous Q24H   feeding supplement (NEPRO CARB STEADY)  237 mL Oral TID BM   fesoterodine  4 mg Oral Daily   folic acid  1 mg Oral Daily   ipratropium-albuterol  3 mL Nebulization Once   levothyroxine  75 mcg Oral Q0600   methotrexate  15 mg Oral Q Fri   mirabegron ER  25 mg Oral Daily   multivitamin with minerals  1 tablet Oral Daily   pantoprazole  40 mg Oral Daily   predniSONE  2.5 mg Oral Daily   Continuous Infusions:  azithromycin     cefTRIAXone (ROCEPHIN)  IV Stopped (06/03/22 1453)     LOS: 1 day     Desma Maxim, MD [facility]   If 7PM-7AM, please contact night-coverage [email or web address] Password [facility] 06/04/2022, 11:27 AM

## 2022-06-05 DIAGNOSIS — R339 Retention of urine, unspecified: Secondary | ICD-10-CM | POA: Insufficient documentation

## 2022-06-05 DIAGNOSIS — J9601 Acute respiratory failure with hypoxia: Secondary | ICD-10-CM | POA: Diagnosis not present

## 2022-06-05 LAB — BASIC METABOLIC PANEL WITH GFR
Anion gap: 7 (ref 5–15)
BUN: 17 mg/dL (ref 8–23)
CO2: 23 mmol/L (ref 22–32)
Calcium: 8.1 mg/dL — ABNORMAL LOW (ref 8.9–10.3)
Chloride: 109 mmol/L (ref 98–111)
Creatinine, Ser: 0.57 mg/dL (ref 0.44–1.00)
GFR, Estimated: >60 mL/min
Glucose, Bld: 99 mg/dL (ref 70–99)
Potassium: 3.2 mmol/L — ABNORMAL LOW (ref 3.5–5.1)
Sodium: 139 mmol/L (ref 135–145)

## 2022-06-05 LAB — URINE CULTURE: Culture: NO GROWTH

## 2022-06-05 LAB — CBC
HCT: 27 % — ABNORMAL LOW (ref 36.0–46.0)
Hemoglobin: 8.8 g/dL — ABNORMAL LOW (ref 12.0–15.0)
MCH: 31.5 pg (ref 26.0–34.0)
MCHC: 32.6 g/dL (ref 30.0–36.0)
MCV: 96.8 fL (ref 80.0–100.0)
Platelets: 209 10*3/uL (ref 150–400)
RBC: 2.79 MIL/uL — ABNORMAL LOW (ref 3.87–5.11)
RDW: 15.9 % — ABNORMAL HIGH (ref 11.5–15.5)
WBC: 9.7 10*3/uL (ref 4.0–10.5)
nRBC: 0 % (ref 0.0–0.2)

## 2022-06-05 LAB — MAGNESIUM: Magnesium: 2.4 mg/dL (ref 1.7–2.4)

## 2022-06-05 MED ORDER — POTASSIUM CHLORIDE CRYS ER 20 MEQ PO TBCR
20.0000 meq | EXTENDED_RELEASE_TABLET | Freq: Once | ORAL | Status: AC
  Administered 2022-06-05: 20 meq via ORAL
  Filled 2022-06-05: qty 1

## 2022-06-05 MED ORDER — SODIUM CHLORIDE 0.9 % IV SOLN
3.0000 g | Freq: Four times a day (QID) | INTRAVENOUS | Status: DC
  Filled 2022-06-05: qty 8

## 2022-06-05 MED ORDER — AMOXICILLIN-POT CLAVULANATE 875-125 MG PO TABS: 1.0000 | ORAL_TABLET | Freq: Two times a day (BID) | ORAL | 0 refills | Status: DC

## 2022-06-05 MED ORDER — POLYETHYLENE GLYCOL 3350 17 G PO PACK
34.0000 g | PACK | Freq: Every day | ORAL | Status: DC
  Administered 2022-06-05: 34 g via ORAL
  Filled 2022-06-05: qty 2

## 2022-06-05 MED ORDER — SENNA 8.6 MG PO TABS
2.0000 | ORAL_TABLET | Freq: Every day | ORAL | Status: DC
  Administered 2022-06-05: 17.2 mg via ORAL
  Filled 2022-06-05: qty 2

## 2022-06-05 MED ORDER — SODIUM CHLORIDE 0.9 % IV SOLN
1.5000 g | Freq: Four times a day (QID) | INTRAVENOUS | Status: DC
  Administered 2022-06-05 – 2022-06-06 (×4): 1.5 g via INTRAVENOUS
  Filled 2022-06-05 (×3): qty 4
  Filled 2022-06-05: qty 1.5
  Filled 2022-06-05: qty 4

## 2022-06-05 NOTE — Discharge Summary (Signed)
Gloria [name] [id number] DOB: 10/14/1925 DOA: 06/01/2022  PCP: Gloria Hector, MD  Admit date: 06/01/2022 Discharge date: 06/06/2022  Time spent: 35 minutes  Recommendations for Outpatient Follow-up:  Pcp f/u Urology f/u with close attention to urinary retention Check cbc 1 week, consider re-start of aspirin if stable Incentive spirometry    Discharge Diagnoses:  Principal Problem:   Acute respiratory failure with hypoxia ([place]) Active Problems:   Recurrent UTI   Weakness   Leukocytosis   Hypothyroidism   Rheumatoid arthritis ([place])   Anemia   Essential hypertension   Peripheral vascular disease ([place])   Malnutrition of moderate degree   Urinary retention   Discharge Condition: stable  Diet recommendation: dysphagia 3  Filed Weights   06/01/22 1240  Weight: 68.5 kg    History of present illness:  From admission h and p by dr. Ernestina [name]: Gloria [name] is a 89 y.o. female with medical history significant of rheumatoid arthritis, collagen vascular disease, hypertension, hyperlipidemia, hypothyroidism, GERD, aneurysm of the ascending aorta status post stent placement presenting with acute respiratory failure with hypoxia, UTI.  Patient noted to have been evaluated in the ER yesterday with concern for recurrent falls and recurrent UTI.  Patient noted to have completed a recent 2-week course of antibiotics for urinary tract infection with patient developing worsening weakness and falls at home.  Patient lived independently prior to this.  Patient was placed in ER observation with plan for SNF placement and patient being placed on a course of Macrobid for UTI coverage.  Patient noted to have worsening leukocytosis with evaluation in the ER including a CT being obtained that showed no obvious signs of infection.  Macrobid was continued patient's home medications were added on.  Per report, patient had acute hypoxic episode earlier in the morning after a possible event of  aspiration where nursing noticed a lot of coughed up fluid on the patient's gown.  Patient apparently unable to tolerate liquids per report.  At present, patient is mildly confused but denies any active issues including chest pain or shortness of breath.  Does have some chronic left hip pain.  No abdominal pain. Currently in the ER with a Tmax 100.1, heart rate in the 50s 60s, BP 120s to 130s over 6100s.  Requiring 3 L nasal cannula to keep O2 sats greater than 98%.  White count 22.3-18.2.  Hemoglobin 11.4.  Creatinine 0.61.  CTA chest yesterday with small black bilateral pleural effusions with associated bibasilar atelectasis.  No overt infiltrate.  Stable thoracic aortic aneurysm.  Follow-up chest x-ray with no substantial change.  Started on Rocephin and azithromycin in the ER.   Hospital Course:   # Hypoxic respiratory failure # Possible aspiration pneumonia # Atelectasis # Pleural effusions Boarding in the ED when there was a possible aspiration event and hypoxia to the mid 80s. Has since been weaned off o2 and is breathing comfortably. CTA neg for PE but patient does have b/l lower lobe opacities and trace pleural effusions (too small to tap per IR). Favor atelectasis and patient has been relatively bedbound for several days. Was treated for aspiration pneumonia and this saw resolution of leukocytosis so will elect to continue abx to complete a course.  - advise pcp f/u in a few weeks, consider repeat CXR then - continue incentive spirometry, up out of bed as much as possible  # Recurrent UTI # Urinary retention? Chronic UTIs. Here urine not suggestive of infection. Did retain requiring IO cath. Has  since been able to void but not able to completely empty bladder. Home myrbetriq and vesicare stopped. Advise close monitoring of urine output and s/s of retention. Will also need outpatient urology f/u.   - continue macrobid ppx  # Normocytic anemia Presenting hgb in 11s, drifted down to 8s, no  clinical signs of bleeding, no melena. May to some degree be dillutional. - b12 result pending - aspirin on hold, would advise repeat cbc 1 week, consider re-start if hgb stable or improved, further w/u if continues to drop  # Subacute compression fracture L1, seen on CT. Recent fall. Pain is mild. Characterized as mild per radiology - d/c to skilled nursing - consider outpt treatment of osteoporosis   # RA - cont home prednisone - think ok to resume home methotrexate given above infection appears to be mild   # Hypothyroid - cont home synthroid   # HTN # Hx thoracic aortic aneurysm s/p stent Here bp low normal - hold home amlod - cont home coreg, statin - asa on hold as above   # Dementia No behavioral disturbance  Procedures: none   Consultations: none  Discharge Exam: Vitals:   06/05/22 2051 06/06/22 0444  BP: 132/68 (!) 152/56  Pulse: 66 64  Resp: 18 16  Temp: 98.1 F (36.7 C) 98.1 F (36.7 C)  SpO2: 94% 97%    General exam: Appears calm and comfortable  Respiratory system: Clear to auscultation save for rales at bases Cardiovascular system: S1 & S2 heard, RRR. Systolic murmur Gastrointestinal system: Abdomen is nondistended, soft and nontender. No organomegaly or masses felt. Normal bowel sounds heard. Central nervous system: Alert and oriented to self Extremities: Symmetric 5 x 5 power. Skin: No rashes, lesions or ulcers Psychiatry: calm  Discharge Instructions   Discharge Instructions     Diet - low sodium heart healthy   Complete by: As directed    Diet - low sodium heart healthy   Complete by: As directed    Increase activity slowly   Complete by: As directed    Increase activity slowly   Complete by: As directed       Allergies as of 06/06/2022       Reactions   Amoxicillin Other (See Comments)   Intolerant Daughter and Son report patient tolerated course of Augmentin in January 2024   Codeine Nausea Only   Ibandronate Sodium     Ibandronate Sodium  [ibandronic Acid] Other (See Comments)   Gi upset   Risedronate Other (See Comments)   Gi upset   Risedronate Sodium    Sulfa Antibiotics Hives        Medication List     STOP taking these medications    amLODipine 5 MG tablet Commonly known as: NORVASC   aspirin EC 81 MG tablet   Myrbetriq 25 MG Tb24 tablet Generic drug: mirabegron ER   solifenacin 10 MG tablet Commonly known as: VESICARE       TAKE these medications    acetaminophen 325 MG tablet Commonly known as: TYLENOL Take 2 tablets (650 mg total) by mouth every 4 (four) hours as needed for moderate pain.   amoxicillin-clavulanate 875-125 MG tablet Commonly known as: AUGMENTIN Take 1 tablet by mouth 2 (two) times daily.   atorvastatin 20 MG tablet Commonly known as: LIPITOR Take 20 mg by mouth daily.   carvedilol 6.25 MG tablet Commonly known as: COREG Take 6.25 mg by mouth 2 (two) times daily with a meal.   folic acid  1 MG tablet Commonly known as: FOLVITE Take 1 mg by mouth daily.   levothyroxine 75 MCG tablet Commonly known as: SYNTHROID Take 75 mcg by mouth daily.   methotrexate 2.5 MG tablet Commonly known as: RHEUMATREX Take 15 mg by mouth every Friday.   multivitamin with minerals Tabs tablet Take 1 tablet by mouth daily.   nitrofurantoin (macrocrystal-monohydrate) 100 MG capsule Commonly known as: MACROBID Take 1 capsule (100 mg total) by mouth daily. What changed: when to take this   predniSONE 5 MG tablet Commonly known as: DELTASONE Take 2.5 mg by mouth daily.       Allergies  Allergen Reactions   Amoxicillin Other (See Comments)    Intolerant  Daughter and Son report patient tolerated course of Augmentin in January 2024   Codeine Nausea Only   Ibandronate Sodium    Ibandronate Sodium  [Ibandronic Acid] Other (See Comments)    Gi upset   Risedronate Other (See Comments)    Gi upset   Risedronate Sodium    Sulfa Antibiotics Hives    Follow-up  Information     Bjorn Loser, MD Follow up.   Specialty: Urology Contact information: [place] [address] [phone]         Gloria Hector, MD Follow up.   Specialty: Internal Medicine Contact information: [place] [employer] [id number] [phone]                  The results of significant diagnostics from this hospitalization (including imaging, microbiology, ancillary and laboratory) are listed below for reference.    Significant Diagnostic Studies: CT Angio Chest Pulmonary Embolism (PE) W or WO Contrast  Result Date: 06/04/2022 CLINICAL DATA:  Hypoxia.  Elevated D-dimer. EXAM: CT ANGIOGRAPHY CHEST WITH CONTRAST TECHNIQUE: Multidetector CT imaging of the chest was performed using the standard protocol during bolus administration of intravenous contrast. Multiplanar CT image reconstructions and MIPs were obtained to evaluate the vascular anatomy. RADIATION DOSE REDUCTION: This exam was performed according to the departmental dose-optimization program which includes automated exposure control, adjustment of the mA and/or kV according to patient size and/or use of iterative reconstruction technique. CONTRAST:  69m OMNIPAQUE IOHEXOL 350 MG/ML SOLN COMPARISON:  Chest x-ray 06/03/2022. Standard CT noncontrast 06/01/2022. Older exams as well. CT angiogram 12/24/2019. FINDINGS: Cardiovascular: There is a descending thoracic aorta stent graft originating along the distal arch. Stent is grossly patent. No obvious endoleak on this standard CTA without endoleak protocol. There appears to be ectasia of the ascending aorta with diameter approaching 4.8 by 4.6 cm at the level of the right pulmonary artery. On the study of 2021 the ascending aorta has a diameter of 4.4 by 4.2 cm, slow increase in size. No dissection. There are some calcifications along the aortic valve. The heart is mildly enlarged. There is some left ventricular wall  hypertrophy. Trace pericardial fluid. No pulmonary embolism identified. Mediastinum/Nodes: Patulous esophagus with air and fluid. Small hiatal hernia. No specific abnormal lymph node enlargement identified in the axillary region. There are some small subcentimeter hilar and mediastinal nodes. Nonspecific Lungs/Pleura: Small bilateral pleural effusions with adjacent lung opacities. Atelectasis versus infiltrate. These are increased from the study of 06/01/2022. Breathing motion. No pneumothorax. Stable areas of reticulonodular changes in the right upper lobe. Upper Abdomen: Adrenal glands are preserved in the upper abdomen. Distended gallbladder. Musculoskeletal: Scattered degenerative changes along the spine. There is compression of the superior endplate of L1 with some sclerosis. The  sclerosis is progressive from the study of 03/02/2022. Please correlate for a late subacute, chronic injury. Review of the MIP images confirms the above findings. IMPRESSION: No pulmonary embolism identified. Thoracic aortic stent graft in place. Persistent enlargement of the ascending aorta. Recommend semi-annual imaging followup by CTA or MRA and referral to cardiothoracic surgery if not already obtained. This recommendation follows 2010 ACCF/AHA/AATS/ACR/ASA/SCA/SCAI/SIR/STS/SVM Guidelines for the Diagnosis and Management of Patients With Thoracic Aortic Disease. Circulation. 2010; [id number]. Aortic aneurysm NOS (ICD10-I71.9) Increasing small pleural effusions and adjacent lung opacities. Atelectasis versus infiltrate. Recommend follow-up Patulous esophagus with air and fluid. Please correlate with any symptoms. Small hiatal hernia. Electronically Signed   By: Jill Side M.D.   On: 06/04/2022 17:38   DG Chest Portable 1 View  Result Date: 06/03/2022 CLINICAL DATA:  Possible aspiration / Hypoxic EXAM: PORTABLE CHEST 1 VIEW COMPARISON:  February 27, 24. FINDINGS: No substantial change from the prior. No new confluent  consolidation. Small bilateral pleural effusions and overlying bibasilar atelectasis better characterized on recent CT chest. No visible pneumothorax. Cardiomediastinal silhouette is unchanged with aortic stent. IMPRESSION: No substantial change from the prior. No new confluent consolidation. Small bilateral pleural effusions and overlying bibasilar atelectasis better characterized on recent CT chest. Electronically Signed   By: Margaretha Sheffield M.D.   On: 06/03/2022 10:40   CT Chest Wo Contrast  Result Date: 06/01/2022 CLINICAL DATA:  Respiratory illness, nondiagnostic xray EXAM: CT CHEST WITHOUT CONTRAST TECHNIQUE: Multidetector CT imaging of the chest was performed following the standard protocol without IV contrast. RADIATION DOSE REDUCTION: This exam was performed according to the departmental dose-optimization program which includes automated exposure control, adjustment of the mA and/or kV according to patient size and/or use of iterative reconstruction technique. COMPARISON:  03/02/2022 FINDINGS: Cardiovascular: No significant coronary artery calcification. Global cardiac size within normal limits. No pericardial effusion. Central pulmonary arteries are of normal caliber. Stable fusiform ascending thoracic aortic aneurysm measuring 4.7 cm in greatest dimension. The descending thoracic aortic stent graft repair has been performed and the descending thoracic aorta is of normal caliber. Superimposed mild aortic atherosclerotic calcification. Mediastinum/Nodes: The thyroid gland is atrophic, unchanged. No pathologic thoracic adenopathy. Esophagus unremarkable. Small hiatal hernia. Lungs/Pleura: Small bilateral pleural effusions are present with associated bibasilar atelectasis. No superimposed confluent pulmonary infiltrate. Stable nodular scarring within the right upper lobe. No superimposed confluent pulmonary infiltrate. No pneumothorax. No central obstructing lesion. Upper Abdomen: No acute abnormality.  Musculoskeletal: Probable subacute to remote fracture of the left 11 rib laterally with a small amount of surrounding callus formation. No acute bone abnormality. No lytic or blastic bone lesion. Osseous structures are age-appropriate. IMPRESSION: 1. Small bilateral pleural effusions with associated bibasilar atelectasis. No superimposed confluent pulmonary infiltrate. 2. Stable fusiform ascending thoracic aortic aneurysm measuring 4.7 cm in greatest dimension. Status post descending thoracic aortic stent graft repair. Recommend semi-annual imaging followup by CTA or MRA and referral to cardiothoracic surgery if not already obtained. This recommendation follows 2010 ACCF/AHA/AATS/ACR/ASA/SCA/SCAI/SIR/STS/SVM Guidelines for the Diagnosis and Management of Patients With Thoracic Aortic Disease. Circulation. 2010; [id number]. Aortic aneurysm NOS (ICD10-I71.9) 3. Small hiatal hernia. Aortic Atherosclerosis (ICD10-I70.0). Aortic aneurysm NOS (ICD10-I71.9). Electronically Signed   By: Fidela Salisbury M.D.   On: 06/01/2022 23:50   MR BRAIN WO CONTRAST  Result Date: 06/01/2022 CLINICAL DATA:  Initial evaluation for mental status change, unknown cause. EXAM: MRI HEAD WITHOUT CONTRAST TECHNIQUE: Multiplanar, multiecho pulse sequences of the brain and surrounding structures were obtained without intravenous contrast. COMPARISON:  Prior CT from earlier the same day. FINDINGS: Brain: Diffuse prominence of the CSF containing spaces compatible generalized cerebral atrophy. Scattered patchy T2/FLAIR hyperintensity involving the periventricular white matter, most characteristic of chronic microvascular ischemic disease, mild for age. No evidence for acute or subacute ischemia. Gray-white matter differentiation maintained. No areas of chronic cortical infarction. No acute or chronic intracranial blood products. No mass lesion, midline shift or mass effect. No hydrocephalus or extra-axial fluid collection. Pituitary gland and  suprasellar region within normal limits. Vascular: Major intracranial vascular flow voids are maintained. Skull and upper cervical spine: Craniocervical junction normal. Bone marrow signal intensity within normal limits. No scalp soft tissue abnormality. Sinuses/Orbits: Prior bilateral ocular lens replacement. Scattered chronic mucosal thickening present about the ethmoidal air cells and maxillary sinuses. Small bilateral mastoid effusions noted, slightly larger on the left, of doubtful significance. Visualized nasopharynx unremarkable. Other: None. IMPRESSION: 1. No acute intracranial abnormality. 2. Generalized age-related cerebral atrophy with mild chronic small vessel ischemic disease. Electronically Signed   By: Jeannine Boga M.D.   On: 06/01/2022 21:58   DG Chest Portable 1 View  Result Date: 06/01/2022 CLINICAL DATA:  Weakness EXAM: PORTABLE CHEST 1 VIEW COMPARISON:  None Available. FINDINGS: Mild bibasilar atelectasis. Lungs are otherwise clear. No pneumothorax or pleural effusion. Descending thoracic aortic stent graft again noted. Cardiac size within normal limits. Pulmonary vascularity is normal. Osseous structures are age-appropriate. IMPRESSION: No active disease. Electronically Signed   By: Fidela Salisbury M.D.   On: 06/01/2022 17:57   CT ABDOMEN PELVIS W CONTRAST  Result Date: 06/01/2022 CLINICAL DATA:  Possible diverticulitis. Chronic UTI since 12/23. Recent fall 2 days ago. EXAM: CT ABDOMEN AND PELVIS WITH CONTRAST TECHNIQUE: Multidetector CT imaging of the abdomen and pelvis was performed using the standard protocol following bolus administration of intravenous contrast. RADIATION DOSE REDUCTION: This exam was performed according to the departmental dose-optimization program which includes automated exposure control, adjustment of the mA and/or kV according to patient size and/or use of iterative reconstruction technique. CONTRAST:  124m OMNIPAQUE IOHEXOL 300 MG/ML  SOLN COMPARISON:   10/13/2021, CT chest 03/02/2022 FINDINGS: Lower chest: Very small bilateral pleural effusions with associated bibasilar atelectasis. Patient has aortic stent graft partially visualized over the descending thoracic aorta. Hepatobiliary: Liver and biliary tree are within normal. Possible subtle dependent cholelithiasis. Pancreas: Normal. Spleen: Normal. Adrenals/Urinary Tract: Adrenal glands are normal. Kidneys are normal in size without hydronephrosis. No nephrolithiasis. 1 cm hypodensity over the mid pole left kidney unchanged and too small to characterize, although likely a cyst. No imaging follow-up recommended. Visualized ureters and bladder are normal. Distal ureters partially obscured by streak artifact from right hip prosthesis. Stomach/Bowel: Stomach and small bowel are normal. Appendix not visualized. Mild fecal retention throughout the colon. Minimal diverticulosis of the sigmoid colon without evidence of diverticulitis. Vascular/Lymphatic: Mild calcified plaque over the abdominal aorta which is normal in caliber. No adenopathy. Reproductive: Previous hysterectomy.  Pessary in place. Other: Tiny umbilical hernia containing only peritoneal fat. Small right inguinal hernia containing only peritoneal fat. Musculoskeletal: Right hip arthroplasty intact. Depression of the superior endplate of L1 compatible with mild compression fracture new since April 2023, although not significantly changed from November 2023. IMPRESSION: 1. No acute findings in the abdomen/pelvis. 2. Minimal diverticulosis of the sigmoid colon without evidence of diverticulitis. 3. Very small bilateral pleural effusions with associated bibasilar atelectasis. 4. 1 cm left renal cortical hypodensity unchanged and too small to characterize, although likely a cyst. No imaging follow-up recommended. 5. Mild  L1 compression fracture new since April 2023, although not significantly changed from November 2023. 6. Aortic stent graft partially  visualized over the descending thoracic aorta. 7. Aortic atherosclerosis. 8. Tiny umbilical hernia containing only peritoneal fat. Small right inguinal hernia containing only peritoneal fat. Aortic Atherosclerosis (ICD10-I70.0). Electronically Signed   By: Marin Olp M.D.   On: 06/01/2022 16:28   CT Head Wo Contrast  Result Date: 06/01/2022 CLINICAL DATA:  Chronic urinary tract infection, fell 2 days ago, hit head EXAM: CT HEAD WITHOUT CONTRAST TECHNIQUE: Contiguous axial images were obtained from the base of the skull through the vertex without intravenous contrast. RADIATION DOSE REDUCTION: This exam was performed according to the departmental dose-optimization program which includes automated exposure control, adjustment of the mA and/or kV according to patient size and/or use of iterative reconstruction technique. COMPARISON:  11/21/2020 FINDINGS: Brain: No acute infarct or hemorrhage. Lateral ventricles and midline structures appear unremarkable. No acute extra-axial fluid collection. No mass effect. Vascular: No hyperdense vessel or unexpected calcification. Skull: Normal. Negative for fracture or focal lesion. Sinuses/Orbits: Mild mucoperiosteal thickening within the ethmoid and maxillary sinuses. No gas fluid levels. Other: None. IMPRESSION: 1. No acute intracranial process. Electronically Signed   By: Randa Ngo M.D.   On: 06/01/2022 16:18   DG Sacrum/Coccyx  Result Date: 06/01/2022 CLINICAL DATA:  Fall.  Pain in back EXAM: SACRUM AND COCCYX - 2+ VIEW COMPARISON:  12/16/2020 FINDINGS: Focal area of cortical buckling involving the anterior cortex of the distal sacrum noted. Previous right hip arthroplasty. Status post bilateral sacroplasty. Bones appear diffusely osteopenic. No sign of acute fracture or dislocation. IMPRESSION: 1. Focal area of cortical buckling involving the anterior cortex of the distal sacrum. Cannot exclude a nondisplaced fracture. 2. Osteopenia. Electronically Signed    By: Kerby Moors M.D.   On: 06/01/2022 14:14    Microbiology: Recent Results (from the past 240 hour(s))  Resp panel by RT-PCR (RSV, Flu A&B, Covid) Anterior Nasal Swab     Status: None   Collection Time: 06/01/22  8:09 PM   Specimen: Anterior Nasal Swab  Result Value Ref Range Status   SARS Coronavirus 2 by RT PCR NEGATIVE NEGATIVE Final    Comment: (NOTE) SARS-CoV-2 target nucleic acids are NOT DETECTED.  The SARS-CoV-2 RNA is generally detectable in upper respiratory specimens during the acute phase of infection. The lowest concentration of SARS-CoV-2 viral copies this assay can detect is 138 copies/mL. A negative result does not preclude SARS-Cov-2 infection and should not be used as the sole basis for treatment or other patient management decisions. A negative result may occur with  improper specimen collection/handling, submission of specimen other than nasopharyngeal swab, presence of viral mutation(s) within the areas targeted by this assay, and inadequate number of viral copies(<138 copies/mL). A negative result must be combined with clinical observations, patient history, and epidemiological information. The expected result is Negative.  Fact Sheet for Patients:  EntrepreneurPulse.com.au  Fact Sheet for Healthcare Providers:  IncredibleEmployment.be  This test is no t yet approved or cleared by the [place] FDA and  has been authorized for detection and/or diagnosis of SARS-CoV-2 by FDA under an Emergency Use Authorization (EUA). This EUA will remain  in effect (meaning this test can be used) for the duration of the COVID-19 declaration under Section 564(b)(1) of the Act, 21 U.S.C.section 360bbb-3(b)(1), unless the authorization is terminated  or revoked sooner.       Influenza A by PCR NEGATIVE NEGATIVE Final   Influenza B  by PCR NEGATIVE NEGATIVE Final    Comment: (NOTE) The Xpert Xpress SARS-CoV-2/FLU/RSV plus assay is  intended as an aid in the diagnosis of influenza from Nasopharyngeal swab specimens and should not be used as a sole basis for treatment. Nasal washings and aspirates are unacceptable for Xpert Xpress SARS-CoV-2/FLU/RSV testing.  Fact Sheet for Patients: EntrepreneurPulse.com.au  Fact Sheet for Healthcare Providers: IncredibleEmployment.be  This test is not yet approved or cleared by the [place] FDA and has been authorized for detection and/or diagnosis of SARS-CoV-2 by FDA under an Emergency Use Authorization (EUA). This EUA will remain in effect (meaning this test can be used) for the duration of the COVID-19 declaration under Section 564(b)(1) of the Act, 21 U.S.C. section 360bbb-3(b)(1), unless the authorization is terminated or revoked.     Resp Syncytial Virus by PCR NEGATIVE NEGATIVE Final    Comment: (NOTE) Fact Sheet for Patients: EntrepreneurPulse.com.au  Fact Sheet for Healthcare Providers: IncredibleEmployment.be  This test is not yet approved or cleared by the [place] FDA and has been authorized for detection and/or diagnosis of SARS-CoV-2 by FDA under an Emergency Use Authorization (EUA). This EUA will remain in effect (meaning this test can be used) for the duration of the COVID-19 declaration under Section 564(b)(1) of the Act, 21 U.S.C. section 360bbb-3(b)(1), unless the authorization is terminated or revoked.  Performed at [facility], [address] [place], [place] [address]   Resp panel by RT-PCR (RSV, Flu A&B, Covid) Anterior Nasal Swab     Status: None   Collection Time: 06/03/22 11:17 AM   Specimen: Anterior Nasal Swab  Result Value Ref Range Status   SARS Coronavirus 2 by RT PCR NEGATIVE NEGATIVE Final    Comment: (NOTE) SARS-CoV-2 target nucleic acids are NOT DETECTED.  The SARS-CoV-2 RNA is generally detectable in upper respiratory specimens during  the acute phase of infection. The lowest concentration of SARS-CoV-2 viral copies this assay can detect is 138 copies/mL. A negative result does not preclude SARS-Cov-2 infection and should not be used as the sole basis for treatment or other patient management decisions. A negative result may occur with  improper specimen collection/handling, submission of specimen other than nasopharyngeal swab, presence of viral mutation(s) within the areas targeted by this assay, and inadequate number of viral copies(<138 copies/mL). A negative result must be combined with clinical observations, patient history, and epidemiological information. The expected result is Negative.  Fact Sheet for Patients:  EntrepreneurPulse.com.au  Fact Sheet for Healthcare Providers:  IncredibleEmployment.be  This test is no t yet approved or cleared by the [place] FDA and  has been authorized for detection and/or diagnosis of SARS-CoV-2 by FDA under an Emergency Use Authorization (EUA). This EUA will remain  in effect (meaning this test can be used) for the duration of the COVID-19 declaration under Section 564(b)(1) of the Act, 21 U.S.C.section 360bbb-3(b)(1), unless the authorization is terminated  or revoked sooner.       Influenza A by PCR NEGATIVE NEGATIVE Final   Influenza B by PCR NEGATIVE NEGATIVE Final    Comment: (NOTE) The Xpert Xpress SARS-CoV-2/FLU/RSV plus assay is intended as an aid in the diagnosis of influenza from Nasopharyngeal swab specimens and should not be used as a sole basis for treatment. Nasal washings and aspirates are unacceptable for Xpert Xpress SARS-CoV-2/FLU/RSV testing.  Fact Sheet for Patients: EntrepreneurPulse.com.au  Fact Sheet for Healthcare Providers: IncredibleEmployment.be  This test is not yet approved or cleared by the [place] FDA and has  been authorized for detection and/or  diagnosis of SARS-CoV-2 by FDA under an Emergency Use Authorization (EUA). This EUA will remain in effect (meaning this test can be used) for the duration of the COVID-19 declaration under Section 564(b)(1) of the Act, 21 U.S.C. section 360bbb-3(b)(1), unless the authorization is terminated or revoked.     Resp Syncytial Virus by PCR NEGATIVE NEGATIVE Final    Comment: (NOTE) Fact Sheet for Patients: EntrepreneurPulse.com.au  Fact Sheet for Healthcare Providers: IncredibleEmployment.be  This test is not yet approved or cleared by the [place] FDA and has been authorized for detection and/or diagnosis of SARS-CoV-2 by FDA under an Emergency Use Authorization (EUA). This EUA will remain in effect (meaning this test can be used) for the duration of the COVID-19 declaration under Section 564(b)(1) of the Act, 21 U.S.C. section 360bbb-3(b)(1), unless the authorization is terminated or revoked.  Performed at [facility], [address] [place], [place] [address]   Blood Culture (routine x 2)     Status: None (Preliminary result)   Collection Time: 06/03/22 11:17 AM   Specimen: BLOOD  Result Value Ref Range Status   Specimen Description BLOOD  LEFT AC  Final   Special Requests   Final    BOTTLES DRAWN AEROBIC AND ANAEROBIC Blood Culture results may not be optimal due to an inadequate volume of blood received in culture bottles   Culture   Final    NO GROWTH 3 DAYS Performed at [facility], [address], [place], [place] [address]    Report Status PENDING  Incomplete  Blood Culture (routine x 2)     Status: None (Preliminary result)   Collection Time: 06/03/22 11:17 AM   Specimen: BLOOD  Result Value Ref Range Status   Specimen Description BLOOD  LEFT HAND  Final   Special Requests   Final    BOTTLES DRAWN AEROBIC AND ANAEROBIC Blood Culture results may not be optimal due to an inadequate volume of blood received in  culture bottles   Culture   Final    NO GROWTH 3 DAYS Performed at [facility], [address], [place], [place] [address]    Report Status PENDING  Incomplete  Urine Culture (for pregnant, neutropenic or urologic patients or patients with an indwelling urinary catheter)     Status: None   Collection Time: 06/04/22 11:32 AM   Specimen: In/Out Cath Urine  Result Value Ref Range Status   Specimen Description   Final    IN/OUT CATH URINE Performed at [facility], [address] [place], [place] [address]    Special Requests   Final    NONE Performed at [facility], [address], [place], [place] [address]    Culture   Final    NO GROWTH Performed at [facility], [place] [address] [place],  [address]    Report Status 06/05/2022 FINAL  Final  Respiratory (~20 pathogens) panel by PCR     Status: None   Collection Time: 06/04/22 12:16 PM  Result Value Ref Range Status   Adenovirus NOT DETECTED NOT DETECTED Final   Coronavirus 229E NOT DETECTED NOT DETECTED Final    Comment: (NOTE) The Coronavirus on the Respiratory Panel, DOES NOT test for the novel  Coronavirus (2019 nCoV)    Coronavirus HKU1 NOT DETECTED NOT DETECTED Final   Coronavirus NL63 NOT DETECTED NOT DETECTED Final   Coronavirus OC43 NOT DETECTED NOT DETECTED Final   Metapneumovirus NOT DETECTED  NOT DETECTED Final   Rhinovirus / Enterovirus NOT DETECTED NOT DETECTED Final   Influenza A NOT DETECTED NOT DETECTED Final   Influenza B NOT DETECTED NOT DETECTED Final   Parainfluenza Virus 1 NOT DETECTED NOT DETECTED Final   Parainfluenza Virus 2 NOT DETECTED NOT DETECTED Final   Parainfluenza Virus 3 NOT DETECTED NOT DETECTED Final   Parainfluenza Virus 4 NOT DETECTED NOT DETECTED Final   Respiratory Syncytial Virus NOT DETECTED NOT DETECTED Final   Bordetella pertussis NOT DETECTED NOT DETECTED Final   Bordetella Parapertussis NOT DETECTED NOT DETECTED Final   Chlamydophila  pneumoniae NOT DETECTED NOT DETECTED Final   Mycoplasma pneumoniae NOT DETECTED NOT DETECTED Final    Comment: Performed at [facility], [place] [address], [place], [place] [address]     Labs: Basic Metabolic Panel: Recent Labs  Lab 06/01/22 1238 06/03/22 1117 06/03/22 1818 06/04/22 0416 06/05/22 0505 06/05/22 0509 06/06/22 0405  NA 134* 135  --  136  --  139 140  K 3.7 3.3*  --  3.6  --  3.2* 3.2*  CL 100 104  --  107  --  109 112*  CO2 28 22  --  23  --  23 21*  GLUCOSE 149* 144*  --  102*  --  99 93  BUN 12 15  --  20  --  17 16  CREATININE 0.78 0.61  --  0.62  --  0.57 0.49  CALCIUM 8.5* 8.3*  --  7.9*  --  8.1* 8.0*  MG  --   --  2.1  --  2.4  --   --    Liver Function Tests: Recent Labs  Lab 06/01/22 1238 06/03/22 1117 06/04/22 0416  AST '25 22 17  '$ ALT '16 13 10  '$ ALKPHOS 60 51 43  BILITOT 1.3* 1.5* 1.3*  PROT 7.0 6.1* 5.2*  ALBUMIN 3.5 2.9* 2.4*   No results for input(s): "LIPASE", "AMYLASE" in the last 168 hours. No results for input(s): "AMMONIA" in the last 168 hours. CBC: Recent Labs  Lab 06/01/22 1238 06/03/22 1117 06/03/22 1819 06/04/22 0416 06/05/22 0509 06/06/22 0405  WBC 22.3* 18.2* 20.2* 16.1* 9.7 8.0  NEUTROABS 19.4* 15.1*  --   --   --   --   HGB 11.5* 11.4* 11.3* 10.0* 8.8* 8.7*  HCT 35.9* 36.0 34.1* 30.9* 27.0* 27.2*  MCV 98.6 100.6* 95.8 97.2 96.8 97.5  PLT 232 205 223 203 209 196   Cardiac Enzymes: No results for input(s): "CKTOTAL", "CKMB", "CKMBINDEX", "TROPONINI" in the last 168 hours. BNP: BNP (last 3 results) Recent Labs    06/04/22 0416  BNP 60.0    ProBNP (last 3 results) No results for input(s): "PROBNP" in the last 8760 hours.  CBG: Recent Labs  Lab 06/04/22 0745  GLUCAP 121*       Signed:  Desma Maxim MD.  [facility] 06/06/2022, 9:02 AM

## 2022-06-05 NOTE — TOC Progression Note (Addendum)
Transition of Care [facility]) - Progression Note    Patient Details  Name: Gloria [name] MRN: [id number] Date of Birth: [birth year]  Transition of [facility] CM/SW Contact  Izola Price, RN Phone Number: 06/05/2022, 12:01 PM  Clinical Narrative: 3/2: Left VM with Angelita Ingles at [facility] to confirm acceptance today as patient is medically ready for discharge and to obtain room number and report phone number for Unit RN to call. 1205 pm. Simmie Davies RN CM     110 pm: Per [facility] can take first thing on Sunday 06/06/22 as [facility] will not be onsite till much later today. Simmie Davies RN CM     Expected Discharge Plan: Skilled Nursing Facility Barriers to Discharge: Continued Medical Work up  Expected Discharge Plan and Services       Living arrangements for the past 2 months[place] Expected Discharge Date: 06/05/22                                     Social Determinants of Health (SDOH) Interventions SDOH Screenings   Food Insecurity: No Food Insecurity (06/03/2022)  Housing: Low Risk  (06/03/2022)  Transportation Needs: No Transportation Needs (06/03/2022)  Utilities: Not At Risk (06/03/2022)  Tobacco Use: Low Risk  (06/03/2022)    Readmission Risk Interventions     No data to display

## 2022-06-05 NOTE — Plan of Care (Signed)
  Problem: Education: Goal: Knowledge of General Education information will improve Description: Including pain rating scale, medication(s)/side effects and non-pharmacologic comfort measures Outcome: Progressing   Problem: Health Behavior/Discharge Planning: Goal: Ability to manage health-related needs will improve Outcome: Progressing   Problem: Clinical Measurements: Goal: Ability to maintain clinical measurements within normal limits will improve Outcome: Progressing Goal: Will remain free from infection Outcome: Progressing Goal: Diagnostic test results will improve Outcome: Progressing Goal: Respiratory complications will improve Outcome: Progressing Goal: Cardiovascular complication will be avoided Outcome: Progressing   Problem: Activity: Goal: Risk for activity intolerance will decrease Outcome: Progressing   Problem: Activity: Goal: Risk for activity intolerance will decrease Outcome: Progressing

## 2022-06-05 NOTE — Progress Notes (Addendum)
PROGRESS NOTE    Gloria [name]  [id number] DOB: April 19, 1925 DOA: 06/01/2022 PCP: Adin Hector, MD  Outpatient Specialists: rheum    Brief Narrative:   From admission h and p  Gloria [name] Gloria [name] is a 89 y.o. female with medical history significant of rheumatoid arthritis, collagen vascular disease, hypertension, hyperlipidemia, hypothyroidism, GERD, aneurysm of the ascending aorta status post stent placement presenting with acute respiratory failure with hypoxia, UTI.  Patient noted to have been evaluated in the ER yesterday with concern for recurrent falls and recurrent UTI.  Patient noted to have completed a recent 2-week course of antibiotics for urinary tract infection with patient developing worsening weakness and falls at home.  Patient lived independently prior to this.  Patient was placed in ER observation with plan for SNF placement and patient being placed on a course of Macrobid for UTI coverage.  Patient noted to have worsening leukocytosis with evaluation in the ER including a CT being obtained that showed no obvious signs of infection.  Macrobid was continued patient's home medications were added on.  Per report, patient had acute hypoxic episode earlier in the morning after a possible event of aspiration where nursing noticed a lot of coughed up fluid on the patient's gown.  Patient apparently unable to tolerate liquids per report.  At present, patient is mildly confused but denies any active issues including chest pain or shortness of breath.  Does have some chronic left hip pain.  No abdominal pain.   Assessment & Plan:   Principal Problem:   Acute respiratory failure with hypoxia (HCC) Active Problems:   Recurrent UTI   Weakness   Leukocytosis   Hypothyroidism   Rheumatoid arthritis (HCC)   Anemia   Essential hypertension   Peripheral vascular disease (HCC)   Malnutrition of moderate degree   Urinary retention  # Delirium MRI nothing acute. Possible  infection as below. Daughter thinks improving  # Hypoxic respiratory failure # Possible aspiration pneumonia # Pleural effusions # Atelectasis Boarding in the ED when there was a possible aspiration event and hypoxia to the mid 37s. Has since been weaned off o2 and is breathing comfortably. CTA neg for PE but patient does have b/l lower lobe opacities and trace pleural effusions (too small to tap per IR). Favor atelectasis and patient has been relatively bedbound for several days. Was treated for aspiration pneumonia and this saw resolution of leukocytosis so will elect to continue abx to complete a course. Covid/flu/rsv/viral panel all neg. Bnp wnl. O2 now normalized, breathing comfortably on room air. - cont unasyn, plan for augmentin at d/c - incentive spirometry - seen by SLP, cleared for dysphagia 3 diet  # Recurrent UTI # Urinary retention? Urinalysis in the ED not suggestive of infection but now with AMS. Followed outpt by urology. Home myrbetriq and vesicare likely contributing. Able to void today but still had 250 in bladder. Ua not suggestive of infection - bladder scan and I/cath prn - holding myrbetriq and vesicare - bowel regimen  # Subacute compression fracture L1, seen on CT. Recent fall. Not complaining of pain. Characterized as mild - monitor - for SNF, has a bed - will repeat UA with culture  # RA - cont home prednisone - hold home methotrexate for now, with folic acid  # Hypothyroid - cont home synthroid  # HTN # # Hx thoracic aortic aneurysm s/p stent Here bp low normal - hold home amlod - cont home coreg, statin, asa  # Dementia  No behavioral disturbance    DVT prophylaxis: lovenox Code Status: DNR, confirmed with daughter  Family Communication: daughter updated @ bedside 3/2  Level of care: Med-Surg Status is: Inpatient Remains inpatient appropriate because: unsafe d/c plan (snf can't take today)    Consultants:   none  Procedures: none  Antimicrobials:  Ceftriaxone/azith>unasyn    Subjective: No complaints, resting  Objective: Vitals:   06/05/22 0009 06/05/22 0414 06/05/22 0752 06/05/22 1124  BP: (!) 105/51 123/63 (!) 114/48   Pulse: 66 64 60 68  Resp: '15 18 19   '$ Temp: 98.1 F (36.7 C) 98.2 F (36.8 C) 98.3 F (36.8 C)   TempSrc:      SpO2: 91% 94% 92% 93%  Weight:      Height:        Intake/Output Summary (Last 24 hours) at 06/05/2022 1314 Last data filed at 06/05/2022 1052 Gross per 24 hour  Intake 500 ml  Output 1500 ml  Net -1000 ml   Filed Weights   06/01/22 1240  Weight: 68.5 kg    Examination:  General exam: Appears calm and comfortable  Respiratory system: Clear to auscultation save for rales at bases Cardiovascular system: S1 & S2 heard, RRR. Systolic murmur Gastrointestinal system: Abdomen is nondistended, soft and nontender. No organomegaly or masses felt. Normal bowel sounds heard. Central nervous system: Alert and oriented to self Extremities: Symmetric 5 x 5 power. Skin: No rashes, lesions or ulcers Psychiatry: calm    Data Reviewed: I have personally reviewed following labs and imaging studies  CBC: Recent Labs  Lab 06/01/22 1238 06/03/22 1117 06/03/22 1819 06/04/22 0416 06/05/22 0509  WBC 22.3* 18.2* 20.2* 16.1* 9.7  NEUTROABS 19.4* 15.1*  --   --   --   HGB 11.5* 11.4* 11.3* 10.0* 8.8*  HCT 35.9* 36.0 34.1* 30.9* 27.0*  MCV 98.6 100.6* 95.8 97.2 96.8  PLT 232 205 223 203 [id number]   Basic Metabolic Panel: Recent Labs  Lab 06/01/22 1238 06/03/22 1117 06/03/22 1818 06/04/22 0416 06/05/22 0505 06/05/22 0509  NA 134* 135  --  136  --  139  K 3.7 3.3*  --  3.6  --  3.2*  CL 100 104  --  107  --  109  CO2 28 22  --  23  --  23  GLUCOSE 149* 144*  --  102*  --  99  BUN 12 15  --  20  --  17  CREATININE 0.78 0.61  --  0.62  --  0.57  CALCIUM 8.5* 8.3*  --  7.9*  --  8.1*  MG  --   --  2.1  --  2.4  --    GFR: Estimated Creatinine  Clearance: 41.8 mL/min (by C-G formula based on SCr of 0.57 mg/dL). Liver Function Tests: Recent Labs  Lab 06/01/22 1238 06/03/22 1117 06/04/22 0416  AST '25 22 17  '$ ALT '16 13 10  '$ ALKPHOS 60 51 43  BILITOT 1.3* 1.5* 1.3*  PROT 7.0 6.1* 5.2*  ALBUMIN 3.5 2.9* 2.4*   No results for input(s): "LIPASE", "AMYLASE" in the last 168 hours. No results for input(s): "AMMONIA" in the last 168 hours. Coagulation Profile: No results for input(s): "INR", "PROTIME" in the last 168 hours. Cardiac Enzymes: No results for input(s): "CKTOTAL", "CKMB", "CKMBINDEX", "TROPONINI" in the last 168 hours. BNP (last 3 results) No results for input(s): "PROBNP" in the last 8760 hours. HbA1C: No results for input(s): "HGBA1C" in the last 72 hours. CBG: Recent  Labs  Lab 06/04/22 0745  GLUCAP 121*   Lipid Profile: No results for input(s): "CHOL", "HDL", "LDLCALC", "TRIG", "CHOLHDL", "LDLDIRECT" in the last 72 hours. Thyroid Function Tests: No results for input(s): "TSH", "T4TOTAL", "FREET4", "T3FREE", "THYROIDAB" in the last 72 hours. Anemia Panel: No results for input(s): "VITAMINB12", "FOLATE", "FERRITIN", "TIBC", "IRON", "RETICCTPCT" in the last 72 hours. Urine analysis:    Component Value Date/Time   COLORURINE YELLOW (A) 06/04/2022 1132   APPEARANCEUR CLEAR (A) 06/04/2022 1132   APPEARANCEUR Cloudy (A) 05/17/2022 1317   LABSPEC 1.018 06/04/2022 1132   PHURINE 6.0 06/04/2022 1132   GLUCOSEU NEGATIVE 06/04/2022 1132   HGBUR NEGATIVE 06/04/2022 1132   BILIRUBINUR NEGATIVE 06/04/2022 1132   BILIRUBINUR Negative 05/17/2022 1317   KETONESUR NEGATIVE 06/04/2022 1132   PROTEINUR NEGATIVE 06/04/2022 1132   NITRITE NEGATIVE 06/04/2022 1132   LEUKOCYTESUR NEGATIVE 06/04/2022 1132   Sepsis Labs: '@LABRCNTIP'$ (procalcitonin:4,lacticidven:4)  ) Recent Results (from the past 240 hour(s))  Resp panel by RT-PCR (RSV, Flu A&B, Covid) Anterior Nasal Swab     Status: None   Collection Time: 06/01/22  8:09 PM    Specimen: Anterior Nasal Swab  Result Value Ref Range Status   SARS Coronavirus 2 by RT PCR NEGATIVE NEGATIVE Final    Comment: (NOTE) SARS-CoV-2 target nucleic acids are NOT DETECTED.  The SARS-CoV-2 RNA is generally detectable in upper respiratory specimens during the acute phase of infection. The lowest concentration of SARS-CoV-2 viral copies this assay can detect is 138 copies/mL. A negative result does not preclude SARS-Cov-2 infection and should not be used as the sole basis for treatment or other patient management decisions. A negative result may occur with  improper specimen collection/handling, submission of specimen other than nasopharyngeal swab, presence of viral mutation(s) within the areas targeted by this assay, and inadequate number of viral copies(<138 copies/mL). A negative result must be combined with clinical observations, patient history, and epidemiological information. The expected result is Negative.  Fact Sheet for Patients:  EntrepreneurPulse.com.au  Fact Sheet for Healthcare Providers:  IncredibleEmployment.be  This test is no t yet approved or cleared by the [place] FDA and  has been authorized for detection and/or diagnosis of SARS-CoV-2 by FDA under an Emergency Use Authorization (EUA). This EUA will remain  in effect (meaning this test can be used) for the duration of the COVID-19 declaration under Section 564(b)(1) of the Act, 21 U.S.C.section 360bbb-3(b)(1), unless the authorization is terminated  or revoked sooner.       Influenza A by PCR NEGATIVE NEGATIVE Final   Influenza B by PCR NEGATIVE NEGATIVE Final    Comment: (NOTE) The Xpert Xpress SARS-CoV-2/FLU/RSV plus assay is intended as an aid in the diagnosis of influenza from Nasopharyngeal swab specimens and should not be used as a sole basis for treatment. Nasal washings and aspirates are unacceptable for Xpert Xpress  SARS-CoV-2/FLU/RSV testing.  Fact Sheet for Patients: EntrepreneurPulse.com.au  Fact Sheet for Healthcare Providers: IncredibleEmployment.be  This test is not yet approved or cleared by the [place] FDA and has been authorized for detection and/or diagnosis of SARS-CoV-2 by FDA under an Emergency Use Authorization (EUA). This EUA will remain in effect (meaning this test can be used) for the duration of the COVID-19 declaration under Section 564(b)(1) of the Act, 21 U.S.C. section 360bbb-3(b)(1), unless the authorization is terminated or revoked.     Resp Syncytial Virus by PCR NEGATIVE NEGATIVE Final    Comment: (NOTE) Fact Sheet for Patients: EntrepreneurPulse.com.au  Fact Sheet for Healthcare  Providers: IncredibleEmployment.be  This test is not yet approved or cleared by the [place] and has been authorized for detection and/or diagnosis of SARS-CoV-2 by FDA under an Emergency Use Authorization (EUA). This EUA will remain in effect (meaning this test can be used) for the duration of the COVID-19 declaration under Section 564(b)(1) of the Act, 21 U.S.C. section 360bbb-3(b)(1), unless the authorization is terminated or revoked.  Performed at [facility], [address] [place], [place] [address]   Resp panel by RT-PCR (RSV, Flu A&B, Covid) Anterior Nasal Swab     Status: None   Collection Time: 06/03/22 11:17 AM   Specimen: Anterior Nasal Swab  Result Value Ref Range Status   SARS Coronavirus 2 by RT PCR NEGATIVE NEGATIVE Final    Comment: (NOTE) SARS-CoV-2 target nucleic acids are NOT DETECTED.  The SARS-CoV-2 RNA is generally detectable in upper respiratory specimens during the acute phase of infection. The lowest concentration of SARS-CoV-2 viral copies this assay can detect is 138 copies/mL. A negative result does not preclude SARS-Cov-2 infection and should not be used  as the sole basis for treatment or other patient management decisions. A negative result may occur with  improper specimen collection/handling, submission of specimen other than nasopharyngeal swab, presence of viral mutation(s) within the areas targeted by this assay, and inadequate number of viral copies(<138 copies/mL). A negative result must be combined with clinical observations, patient history, and epidemiological information. The expected result is Negative.  Fact Sheet for Patients:  EntrepreneurPulse.com.au  Fact Sheet for Healthcare Providers:  IncredibleEmployment.be  This test is no t yet approved or cleared by the [place] FDA and  has been authorized for detection and/or diagnosis of SARS-CoV-2 by FDA under an Emergency Use Authorization (EUA). This EUA will remain  in effect (meaning this test can be used) for the duration of the COVID-19 declaration under Section 564(b)(1) of the Act, 21 U.S.C.section 360bbb-3(b)(1), unless the authorization is terminated  or revoked sooner.       Influenza A by PCR NEGATIVE NEGATIVE Final   Influenza B by PCR NEGATIVE NEGATIVE Final    Comment: (NOTE) The Xpert Xpress SARS-CoV-2/FLU/RSV plus assay is intended as an aid in the diagnosis of influenza from Nasopharyngeal swab specimens and should not be used as a sole basis for treatment. Nasal washings and aspirates are unacceptable for Xpert Xpress SARS-CoV-2/FLU/RSV testing.  Fact Sheet for Patients: EntrepreneurPulse.com.au  Fact Sheet for Healthcare Providers: IncredibleEmployment.be  This test is not yet approved or cleared by the [place] FDA and has been authorized for detection and/or diagnosis of SARS-CoV-2 by FDA under an Emergency Use Authorization (EUA). This EUA will remain in effect (meaning this test can be used) for the duration of the COVID-19 declaration under Section 564(b)(1)  of the Act, 21 U.S.C. section 360bbb-3(b)(1), unless the authorization is terminated or revoked.     Resp Syncytial Virus by PCR NEGATIVE NEGATIVE Final    Comment: (NOTE) Fact Sheet for Patients: EntrepreneurPulse.com.au  Fact Sheet for Healthcare Providers: IncredibleEmployment.be  This test is not yet approved or cleared by the [place] FDA and has been authorized for detection and/or diagnosis of SARS-CoV-2 by FDA under an Emergency Use Authorization (EUA). This EUA will remain in effect (meaning this test can be used) for the duration of the COVID-19 declaration under Section 564(b)(1) of the Act, 21 U.S.C. section 360bbb-3(b)(1), unless the authorization is terminated or revoked.  Performed at [facility], [address] [place],  [place] [address]   Blood Culture (routine x 2)     Status: None (Preliminary result)   Collection Time: 06/03/22 11:17 AM   Specimen: BLOOD  Result Value Ref Range Status   Specimen Description BLOOD  LEFT AC  Final   Special Requests   Final    BOTTLES DRAWN AEROBIC AND ANAEROBIC Blood Culture results may not be optimal due to an inadequate volume of blood received in culture bottles   Culture   Final    NO GROWTH 2 DAYS Performed at [facility], [address], [place], [place] [address]    Report Status PENDING  Incomplete  Blood Culture (routine x 2)     Status: None (Preliminary result)   Collection Time: 06/03/22 11:17 AM   Specimen: BLOOD  Result Value Ref Range Status   Specimen Description BLOOD  LEFT HAND  Final   Special Requests   Final    BOTTLES DRAWN AEROBIC AND ANAEROBIC Blood Culture results may not be optimal due to an inadequate volume of blood received in culture bottles   Culture   Final    NO GROWTH 2 DAYS Performed at [facility], [address], [place], [place] [address]    Report Status PENDING  Incomplete  Urine Culture (for pregnant,  neutropenic or urologic patients or patients with an indwelling urinary catheter)     Status: None   Collection Time: 06/04/22 11:32 AM   Specimen: In/Out Cath Urine  Result Value Ref Range Status   Specimen Description   Final    IN/OUT CATH URINE Performed at [facility], [address] [place], [place] [address]    Special Requests   Final    NONE Performed at [facility], [address] [place], [place] [address]    Culture   Final    NO GROWTH Performed at [facility], [place] [address] [place], [place] [address]    Report Status 06/05/2022 FINAL  Final  Respiratory (~20 pathogens) panel by PCR     Status: None   Collection Time: 06/04/22 12:16 PM  Result Value Ref Range Status   Adenovirus NOT DETECTED NOT DETECTED Final   Coronavirus 229E NOT DETECTED NOT DETECTED Final    Comment: (NOTE) The Coronavirus on the Respiratory Panel, DOES NOT test for the novel  Coronavirus (2019 nCoV)    Coronavirus HKU1 NOT DETECTED NOT DETECTED Final   Coronavirus NL63 NOT DETECTED NOT DETECTED Final   Coronavirus OC43 NOT DETECTED NOT DETECTED Final   Metapneumovirus NOT DETECTED NOT DETECTED Final   Rhinovirus / Enterovirus NOT DETECTED NOT DETECTED Final   Influenza A NOT DETECTED NOT DETECTED Final   Influenza B NOT DETECTED NOT DETECTED Final   Parainfluenza Virus 1 NOT DETECTED NOT DETECTED Final   Parainfluenza Virus 2 NOT DETECTED NOT DETECTED Final   Parainfluenza Virus 3 NOT DETECTED NOT DETECTED Final   Parainfluenza Virus 4 NOT DETECTED NOT DETECTED Final   Respiratory Syncytial Virus NOT DETECTED NOT DETECTED Final   Bordetella pertussis NOT DETECTED NOT DETECTED Final   Bordetella Parapertussis NOT DETECTED NOT DETECTED Final   Chlamydophila pneumoniae NOT DETECTED NOT DETECTED Final   Mycoplasma pneumoniae NOT DETECTED NOT DETECTED Final    Comment: Performed at [facility], [place], [place],  [address]          Radiology Studies: CT Angio Chest Pulmonary Embolism (PE) W or WO Contrast  Result Date: 06/04/2022 CLINICAL DATA:  Hypoxia.  Elevated D-dimer. EXAM: CT ANGIOGRAPHY CHEST WITH CONTRAST TECHNIQUE: Multidetector CT imaging of the chest was performed using the standard protocol during bolus administration of intravenous contrast. Multiplanar CT image reconstructions and MIPs were obtained to evaluate the vascular anatomy. RADIATION DOSE REDUCTION: This exam was performed according to the departmental dose-optimization program which includes automated exposure control, adjustment of the mA and/or kV according to patient size and/or use of iterative reconstruction technique. CONTRAST:  28m OMNIPAQUE IOHEXOL 350 MG/ML SOLN COMPARISON:  Chest x-ray 06/03/2022. Standard CT noncontrast 06/01/2022. Older exams as well. CT angiogram 12/24/2019. FINDINGS: Cardiovascular: There is a descending thoracic aorta stent graft originating along the distal arch. Stent is grossly patent. No obvious endoleak on this standard CTA without endoleak protocol. There appears to be ectasia of the ascending aorta with diameter approaching 4.8 by 4.6 cm at the level of the right pulmonary artery. On the study of 2021 the ascending aorta has a diameter of 4.4 by 4.2 cm, slow increase in size. No dissection. There are some calcifications along the aortic valve. The heart is mildly enlarged. There is some left ventricular wall hypertrophy. Trace pericardial fluid. No pulmonary embolism identified. Mediastinum/Nodes: Patulous esophagus with air and fluid. Small hiatal hernia. No specific abnormal lymph node enlargement identified in the axillary region. There are some small subcentimeter hilar and mediastinal nodes. Nonspecific Lungs/Pleura: Small bilateral pleural effusions with adjacent lung opacities. Atelectasis versus infiltrate. These are increased from the study of 06/01/2022. Breathing motion. No pneumothorax. Stable areas of  reticulonodular changes in the right upper lobe. Upper Abdomen: Adrenal glands are preserved in the upper abdomen. Distended gallbladder. Musculoskeletal: Scattered degenerative changes along the spine. There is compression of the superior endplate of L1 with some sclerosis. The sclerosis is progressive from the study of 03/02/2022. Please correlate for a late subacute, chronic injury. Review of the MIP images confirms the above findings. IMPRESSION: No pulmonary embolism identified. Thoracic aortic stent graft in place. Persistent enlargement of the ascending aorta. Recommend semi-annual imaging followup by CTA or MRA and referral to cardiothoracic surgery if not already obtained. This recommendation follows 2010 ACCF/AHA/AATS/ACR/ASA/SCA/SCAI/SIR/STS/SVM Guidelines for the Diagnosis and Management of Patients With Thoracic Aortic Disease. Circulation. 2010; 121:[id number] Aortic aneurysm NOS (ICD10-I71.9) Increasing small pleural effusions and adjacent lung opacities. Atelectasis versus infiltrate. Recommend follow-up Patulous esophagus with air and fluid. Please correlate with any symptoms. Small hiatal hernia. Electronically Signed   By: AJill SideM.D.   On: 06/04/2022 17:38        Scheduled Meds:  atorvastatin  20 mg Oral Daily   enoxaparin (LOVENOX) injection  40 mg Subcutaneous Q24H   feeding supplement (NEPRO CARB STEADY)  237 mL Oral TID BM   fesoterodine  4 mg Oral Daily   folic acid  1 mg Oral Daily   ipratropium-albuterol  3 mL Nebulization Once   levothyroxine  75 mcg Oral Q0600   multivitamin with minerals  1 tablet Oral Daily   pantoprazole  40 mg Oral Daily   predniSONE  2.5 mg Oral Daily   Continuous Infusions:  ampicillin-sulbactam (UNASYN) IV 1.5 g (06/05/22 1203)     LOS: 2 days     NDesma Maxim MD [facility]   If 7PM-7AM, please contact night-coverage [email or web address] Password [email or web address] 06/05/2022, 1:14 PM

## 2022-06-05 NOTE — Progress Notes (Signed)
Mobility Specialist - Progress Note    06/05/22 1500  Mobility  Activity Stood at bedside;Ambulated with assistance in hallway;Dangled on edge of bed  Level of Assistance Minimal assist, patient does 75% or more  Assistive Device Front wheel walker  Distance Ambulated (ft) 160 ft  Activity Response Tolerated well  Mobility Referral Yes  $Mobility charge 1 Mobility   Pt resting in bed on RA upon entry. Pt STS and ambulates to hallway MinA/SBA with AD. Pt returned to bed and left with needs in reach and bed alarm activated.   Loma Sender Mobility Specialist 06/05/22, 3:23 PM

## 2022-06-05 NOTE — Plan of Care (Signed)

## 2022-06-06 DIAGNOSIS — J9601 Acute respiratory failure with hypoxia: Secondary | ICD-10-CM | POA: Diagnosis not present

## 2022-06-06 LAB — CBC
HCT: 27.2 % — ABNORMAL LOW (ref 36.0–46.0)
Hemoglobin: 8.7 g/dL — ABNORMAL LOW (ref 12.0–15.0)
MCH: 31.2 pg (ref 26.0–34.0)
MCHC: 32 g/dL (ref 30.0–36.0)
MCV: 97.5 fL (ref 80.0–100.0)
Platelets: 196 10*3/uL (ref 150–400)
RBC: 2.79 MIL/uL — ABNORMAL LOW (ref 3.87–5.11)
RDW: 16.1 % — ABNORMAL HIGH (ref 11.5–15.5)
WBC: 8 10*3/uL (ref 4.0–10.5)
nRBC: 0 % (ref 0.0–0.2)

## 2022-06-06 LAB — BASIC METABOLIC PANEL
Anion gap: 7 (ref 5–15)
BUN: 16 mg/dL (ref 8–23)
CO2: 21 mmol/L — ABNORMAL LOW (ref 22–32)
Calcium: 8 mg/dL — ABNORMAL LOW (ref 8.9–10.3)
Chloride: 112 mmol/L — ABNORMAL HIGH (ref 98–111)
Creatinine, Ser: 0.49 mg/dL (ref 0.44–1.00)
GFR, Estimated: >60 mL/min (ref >60)
Glucose, Bld: 93 mg/dL (ref 70–99)
Potassium: 3.2 mmol/L — ABNORMAL LOW (ref 3.5–5.1)
Sodium: 140 mmol/L (ref 135–145)

## 2022-06-06 LAB — VITAMIN B12: Vitamin B-12: 981 pg/mL — ABNORMAL HIGH (ref 180–914)

## 2022-06-06 MED ORDER — AMOXICILLIN-POT CLAVULANATE 875-125 MG PO TABS: 1.0000 | ORAL_TABLET | Freq: Two times a day (BID) | ORAL | 0 refills | Status: DC

## 2022-06-06 NOTE — Progress Notes (Signed)
Mobility Specialist - Progress Note     06/06/22 1000  Mobility  Activity Ambulated with assistance in hallway;Stood at bedside;Dangled on edge of bed  Level of Assistance Contact guard assist, steadying assist  Assistive Device Front wheel walker  Distance Ambulated (ft) 160 ft  Activity Response Tolerated well  Mobility Referral Yes  $Mobility charge 1 Mobility   Pt resting in bed on RA upon entry. Pt appeared a little bit more groggy/fatigue (slower responses) this morning than yesterday (RN Notified). Pt agreeable to ambulation. Pt STS and ambulates to hallway CGA (just for safety) with AD. Pt returned to bed and left with needs in reach and bed alarm activated.   Loma Sender Mobility Specialist 06/06/22, 10:08 AM

## 2022-06-06 NOTE — Plan of Care (Signed)
  Problem: Acute Rehab PT Goals(only PT should resolve) Goal: Pt Will Go Supine/Side To Sit Outcome: Adequate for Discharge Goal: Pt Will Go Sit To Supine/Side Outcome: Adequate for Discharge Goal: Patient Will Transfer Sit To/From Stand Outcome: Adequate for Discharge Goal: Pt Will Perform Standing Balance Or Pre-Gait Outcome: Adequate for Discharge Goal: Pt Will Ambulate Outcome: Adequate for Discharge   Problem: Acute Rehab OT Goals (only OT should resolve) Goal: Pt. Will Perform Grooming Outcome: Adequate for Discharge Goal: Pt. Will Perform Lower Body Dressing Outcome: Adequate for Discharge Goal: Pt. Will Transfer To Toilet Outcome: Adequate for Discharge Goal: Pt. Will Perform Toileting-Clothing Manipulation Outcome: Adequate for Discharge   Problem: Education: Goal: Knowledge of General Education information will improve Description: Including pain rating scale, medication(s)/side effects and non-pharmacologic comfort measures Outcome: Adequate for Discharge   Problem: Health Behavior/Discharge Planning: Goal: Ability to manage health-related needs will improve Outcome: Adequate for Discharge   Problem: Clinical Measurements: Goal: Ability to maintain clinical measurements within normal limits will improve Outcome: Adequate for Discharge Goal: Will remain free from infection Outcome: Adequate for Discharge Goal: Diagnostic test results will improve Outcome: Adequate for Discharge Goal: Respiratory complications will improve Outcome: Adequate for Discharge Goal: Cardiovascular complication will be avoided Outcome: Adequate for Discharge   Problem: Activity: Goal: Risk for activity intolerance will decrease Outcome: Adequate for Discharge   Problem: Nutrition: Goal: Adequate nutrition will be maintained Outcome: Adequate for Discharge   Problem: Coping: Goal: Level of anxiety will decrease Outcome: Adequate for Discharge   Problem: Elimination: Goal: Will  not experience complications related to bowel motility Outcome: Adequate for Discharge Goal: Will not experience complications related to urinary retention Outcome: Adequate for Discharge   Problem: Pain Managment: Goal: General experience of comfort will improve Outcome: Adequate for Discharge   Problem: Safety: Goal: Ability to remain free from injury will improve Outcome: Adequate for Discharge   Problem: Skin Integrity: Goal: Risk for impaired skin integrity will decrease Outcome: Adequate for Discharge   Problem: Malnutrition  (NI-5.2) Goal: Food and/or nutrient delivery Description: Individualized approach for food/nutrient provision. Outcome: Adequate for Discharge

## 2022-06-06 NOTE — Progress Notes (Signed)
Pt d/c to [facility] via daughter. IV removed intact. VSS. Report called to [facility]. All questions answered. All belongings sent with pt.

## 2022-06-06 NOTE — TOC Transition Note (Signed)
Transition of Care [facility]) - CM/SW Discharge Note   Patient Details  Name: Gloria [name] MRN: [id number] Date of Birth: 26-Oct-1925  Transition of Care [facility]) CM/SW Contact:  Izola Price, RN Phone Number: 06/06/2022, 8:29 AM   Clinical Narrative: 06/06/22: Patient to be discharged today and transported to [facility] STR/SNF by daughter. AC Lawerance Sabal is processing admission now after RN CM faxed and inboxed updated DC Summary.  0930: Patient will be going to Room 418 and report will be called to [phone]. AC indicated she had spoken with daughter for where to bring patient (front of health care building) and a number to call for assistance since it is not an EMS transport. This information given to Unit RN and provider.  Simmie Davies RN CM     Final next level of care: Skilled Nursing Facility Barriers to Discharge: Barriers Resolved   Patient Goals and CMS Choice CMS Medicare.gov Compare Post Acute Care list provided to:: Patient Choice offered to / list presented to : Patient  Discharge Placement                Patient chooses bed at: [facility] Patient to be transferred to facility by: Lupton,Rawan Riendeau (Daughter) [phone] (Home Phone) Name of family member notified: Ellison Hughs (Daughter) [phone] (Home Phone) Patient and family notified of of transfer: 06/06/22  Discharge Plan and Services Additional resources added to the After Visit Summary for                  DME Arranged: N/A DME Agency: NA       HH Arranged: NA [place] Agency: NA        Social Determinants of Health (SDOH) Interventions SDOH Screenings   Food Insecurity: No Food Insecurity (06/03/2022)  Housing: Low Risk  (06/03/2022)  Transportation Needs: No Transportation Needs (06/03/2022)  Utilities: Not At Risk (06/03/2022)  Tobacco Use: Low Risk  (06/03/2022)     Readmission Risk Interventions     No data to display

## 2022-06-07 ENCOUNTER — Telehealth: Payer: Self-pay | Admitting: Hospice

## 2022-06-07 ENCOUNTER — Non-Acute Institutional Stay (SKILLED_NURSING_FACILITY): Payer: Medicare PPO | Admitting: Student

## 2022-06-07 DIAGNOSIS — M069 Rheumatoid arthritis, unspecified: Secondary | ICD-10-CM

## 2022-06-07 DIAGNOSIS — Z515 Encounter for palliative care: Secondary | ICD-10-CM

## 2022-06-07 DIAGNOSIS — I10 Essential (primary) hypertension: Secondary | ICD-10-CM

## 2022-06-07 DIAGNOSIS — E440 Moderate protein-calorie malnutrition: Secondary | ICD-10-CM

## 2022-06-07 DIAGNOSIS — R410 Disorientation, unspecified: Secondary | ICD-10-CM

## 2022-06-07 DIAGNOSIS — D849 Immunodeficiency, unspecified: Secondary | ICD-10-CM

## 2022-06-07 DIAGNOSIS — J9601 Acute respiratory failure with hypoxia: Secondary | ICD-10-CM

## 2022-06-07 DIAGNOSIS — R339 Retention of urine, unspecified: Secondary | ICD-10-CM

## 2022-06-07 DIAGNOSIS — D692 Other nonthrombocytopenic purpura: Secondary | ICD-10-CM | POA: Diagnosis not present

## 2022-06-07 DIAGNOSIS — F3342 Major depressive disorder, recurrent, in full remission: Secondary | ICD-10-CM | POA: Diagnosis not present

## 2022-06-07 DIAGNOSIS — I71019 Dissection of thoracic aorta, unspecified: Secondary | ICD-10-CM | POA: Diagnosis not present

## 2022-06-07 DIAGNOSIS — M353 Polymyalgia rheumatica: Secondary | ICD-10-CM

## 2022-06-07 DIAGNOSIS — Z8781 Personal history of (healed) traumatic fracture: Secondary | ICD-10-CM

## 2022-06-07 DIAGNOSIS — F03B3 Unspecified dementia, moderate, with mood disturbance: Secondary | ICD-10-CM

## 2022-06-07 DIAGNOSIS — R911 Solitary pulmonary nodule: Secondary | ICD-10-CM

## 2022-06-07 DIAGNOSIS — I739 Peripheral vascular disease, unspecified: Secondary | ICD-10-CM

## 2022-06-07 DIAGNOSIS — W19XXXS Unspecified fall, sequela: Secondary | ICD-10-CM

## 2022-06-07 DIAGNOSIS — F331 Major depressive disorder, recurrent, moderate: Secondary | ICD-10-CM

## 2022-06-07 DIAGNOSIS — I7121 Aneurysm of the ascending aorta, without rupture: Secondary | ICD-10-CM

## 2022-06-07 NOTE — Telephone Encounter (Signed)
NP called Gloria [name] to schedule palliative consult for patient. She said this would be on hold for now because patient was recently admitted to the hospital and is currently in a Rehab. She would call back when it is convenient for patient/family.

## 2022-06-07 NOTE — Progress Notes (Signed)
Location:  Other Nursing Home Room Number: [place] of Service:  SNF [phone]) Provider:  Bosie Helper, Tonette Bihari, MD  Patient Care Team: Adin Hector, MD as PCP - General (Internal Medicine)  Extended Emergency Contact Information Primary Emergency Contact: Irven [place] of [place] Phone: [phone] Relation: Daughter Secondary Emergency Contact: [place] Phone: [phone] Mobile Phone: [phone] Relation: Son  Code Status:  DNR Goals of care: Advanced Directive information    06/03/2022    4:45 PM  Advanced Directives  Does Patient Have a Medical Advance Directive? Yes  Type of Advance Directive [place]  Does patient want to make changes to medical advance directive? No - Guardian declined  Copy of [place] in Chart? No - copy requested     Chief Complaint  Patient presents with   Acute Visit    Admission to [facility]    HPI:  Pt is a 89 y.o. female seen today for an acute visit for Admission to [facility].   Patient was admitted to the hospital for Acute respiratory failure.   She is oriented to place and self. Doesn't remember going to the hospital. She says nothing is bothering her and she has been sleeping okay, but all she wants to do is sleep.   She is eating lunch and feeding herself. She is eating more today than she had eaten previously. She has had good bowel movements and urinating fine.   She is independent at home. No walker. Cooks meals, grocery store independently. She was getting tired of doing that. She was driving as well. She was hoping to change for another source to get her meals.   She feels like she has failed/ disappointed her children. She just wants to go to sleep and wake up happy. She is disappointed none of her children came today. One is in [place] and she was with her in the hospital.   She feels that "running a house is beyond her." She  is hoping to move to assisted living.   Daughter, [name] provides majority of history; She was supposed to have an infusion but she didn't they said she wasn't acting herself at that time.  She fell a couple of days before. She was complaining of being sore. She was due for an infusion, but she didn't get one. She hadn't been sleeping well. She had a CT scan because hse fell. Tailbone with minor fracture, but not severely. CT scan without anything. Abdominal unremarkable. Some fluid in CXR. She has had some waxing and waning. She has had an issue with urinary retention. - discontinued her medication that can help with that. Tylenol is sufficient for her back pain. Daughter says she has been confused for a long. They are okay with her going wherever is the safest. Undetermined amount of falls in the last year. She does not drive. She has some trouble swallowing so she gets things crushed in medications. She can take some small medications. She has a curvature in her throat. She gets things caught there in her throat.  Past Medical History:  Diagnosis Date   Arthritis    Basal cell carcinoma    Left chin, right nasolabial   Collagen vascular disease (HCC)    RA   Heartburn    Hypertension    Squamous cell carcinoma of skin 05/06/2021   left hand, treated with [facility]   Past Surgical History:  Procedure Laterality Date   ABDOMINAL HYSTERECTOMY     BREAST CYST EXCISION     HIP ARTHROPLASTY Right 11/30/2019   Procedure: ARTHROPLASTY BIPOLAR HIP (HEMIARTHROPLASTY);  Surgeon: Corky Mull, MD;  Location: [facility] ORS;  Service: Orthopedics;  Laterality: Right;   SACROPLASTY N/A 12/17/2020   Procedure: SACROPLASTY;  Surgeon: Hessie Knows, MD;  Location: [facility] ORS;  Service: Orthopedics;  Laterality: N/A;   THORACIC AORTIC ENDOVASCULAR STENT GRAFT N/A 04/11/2019   Procedure: THORACIC AORTIC ENDOVASCULAR STENT GRAFT insertion;  Surgeon: Serafina Mitchell, MD;  Location: [facility] OR;  Service: Vascular;  Laterality:  N/A;   ULTRASOUND GUIDANCE FOR VASCULAR ACCESS Right 04/11/2019   Procedure: Ultrasound Guidance For Vascular Access, right femoral artery;  Surgeon: Serafina Mitchell, MD;  Location: [facility];  Service: Vascular;  Laterality: Right;    Allergies  Allergen Reactions   Amoxicillin Other (See Comments)    Intolerant  Daughter and Son report patient tolerated course of Augmentin in January 2024   Codeine Nausea Only   Ibandronate Sodium    Ibandronate Sodium  [Ibandronic Acid] Other (See Comments)    Gi upset   Risedronate Other (See Comments)    Gi upset   Risedronate Sodium    Sulfa Antibiotics Hives    Outpatient Encounter Medications as of 06/07/2022  Medication Sig   acetaminophen (TYLENOL) 325 MG tablet Take 2 tablets (650 mg total) by mouth every 4 (four) hours as needed for moderate pain.   amoxicillin-clavulanate (AUGMENTIN) 875-125 MG tablet Take 1 tablet by mouth 2 (two) times daily.   atorvastatin (LIPITOR) 20 MG tablet Take 20 mg by mouth daily.   carvedilol (COREG) 6.25 MG tablet Take 6.25 mg by mouth 2 (two) times daily with a meal.   folic acid (FOLVITE) 1 MG tablet Take 1 mg by mouth daily.   levothyroxine (SYNTHROID, LEVOTHROID) 75 MCG tablet Take 75 mcg by mouth daily.    methotrexate (RHEUMATREX) 2.5 MG tablet Take 15 mg by mouth every Friday.   Multiple Vitamin (MULTIVITAMIN WITH MINERALS) TABS tablet Take 1 tablet by mouth daily.   nitrofurantoin, macrocrystal-monohydrate, (MACROBID) 100 MG capsule Take 1 capsule (100 mg total) by mouth daily. (Patient taking differently: Take 100 mg by mouth 2 (two) times daily.)   predniSONE (DELTASONE) 5 MG tablet Take 2.5 mg by mouth daily.   No facility-administered encounter medications on file as of 06/07/2022.    Review of Systems  All other systems reviewed and are negative.   Immunization History  Administered Date(s) Administered   Influenza Split 01/21/2014, 12/04/2015   Influenza, High Dose Seasonal PF 01/31/2017,  12/18/2018   Influenza-Unspecified 01/27/2015, 12/18/2018, 01/01/2020, 01/05/2021, 01/19/2022   Moderna Sars-Covid-2 Vaccination 04/23/2019, 05/21/2019   Pneumococcal Conjugate-13 03/15/2014   Pneumococcal Polysaccharide-23 04/05/2001, 08/13/2016   RSV,unspecified 01/19/2022   Td 07/04/2005   Td (Adult),5 Lf Tetanus Toxid, Preservative Free 02/15/2017   Tdap 07/27/2005, 02/15/2017   Zoster Recombinat (Shingrix) 12/29/2018, 06/10/2019   Zoster, Live 04/05/2006   Pertinent  Health Maintenance Due  Topic Date Due   DEXA SCAN  Never done   INFLUENZA VACCINE  Completed      12/17/2020    9:00 AM 12/17/2020    2:42 PM 12/18/2020    2:00 AM 12/18/2020    1:00 PM 03/28/2022    6:10 AM  Fall Risk  (RETIRED) Patient Fall Risk Level High fall risk High fall risk High fall risk High fall risk High fall risk   Functional Status Survey:  Vitals:   06/07/22 0657  BP: 135/72  Pulse: 64  Resp: 18  Temp: 98 F (36.7 C)  SpO2: 96%  Weight: 154 lb (69.9 kg)  Height: '5\' 6"'$  (1.676 m)   Body mass index is 24.86 kg/m. Physical Exam Constitutional:      Comments: Patient keeps hand on head, eyes closed, leaning on lunch table.   Cardiovascular:     Rate and Rhythm: Normal rate.     Pulses: Normal pulses.     Heart sounds: Normal heart sounds.  Pulmonary:     Effort: Pulmonary effort is normal.     Breath sounds: Normal breath sounds.  Neurological:     Mental Status: She is alert.     Comments: Oriented to self, location, however, confused     Labs reviewed: Recent Labs    06/03/22 1818 06/04/22 0416 06/05/22 0505 06/05/22 0509 06/06/22 0405  NA  --  136  --  139 140  K  --  3.6  --  3.2* 3.2*  CL  --  107  --  109 112*  CO2  --  23  --  23 21*  GLUCOSE  --  102*  --  99 93  BUN  --  20  --  17 16  CREATININE  --  0.62  --  0.57 0.49  CALCIUM  --  7.9*  --  8.1* 8.0*  MG 2.1  --  2.4  --   --    Recent Labs    06/01/22 1238 06/03/22 1117 06/04/22 0416  AST '25 22  17  '$ ALT '16 13 10  '$ ALKPHOS 60 51 43  BILITOT 1.3* 1.5* 1.3*  PROT 7.0 6.1* 5.2*  ALBUMIN 3.5 2.9* 2.4*   Recent Labs    06/01/22 1238 06/03/22 1117 06/03/22 1819 06/04/22 0416 06/05/22 0509 06/06/22 0405  WBC 22.3* 18.2*   < > 16.1* 9.7 8.0  NEUTROABS 19.4* 15.1*  --   --   --   --   HGB 11.5* 11.4*   < > 10.0* 8.8* 8.7*  HCT 35.9* 36.0   < > 30.9* 27.0* 27.2*  MCV 98.6 100.6*   < > 97.2 96.8 97.5  PLT 232 205   < > 203 209 196   < > = values in this interval not displayed.   Lab Results  Component Value Date   TSH 0.124 (L) 04/07/2019   No results found for: "HGBA1C" Lab Results  Component Value Date   CHOL 207 (H) 04/07/2019   HDL 83 04/07/2019   LDLCALC 112 (H) 04/07/2019   TRIG 59 04/07/2019   CHOLHDL 2.5 04/07/2019    Significant Diagnostic Results in last 30 days:  CT Angio Chest Pulmonary Embolism (PE) W or WO Contrast  Result Date: 06/04/2022 CLINICAL DATA:  Hypoxia.  Elevated D-dimer. EXAM: CT ANGIOGRAPHY CHEST WITH CONTRAST TECHNIQUE: Multidetector CT imaging of the chest was performed using the standard protocol during bolus administration of intravenous contrast. Multiplanar CT image reconstructions and MIPs were obtained to evaluate the vascular anatomy. RADIATION DOSE REDUCTION: This exam was performed according to the departmental dose-optimization program which includes automated exposure control, adjustment of the mA and/or kV according to patient size and/or use of iterative reconstruction technique. CONTRAST:  35m OMNIPAQUE IOHEXOL 350 MG/ML SOLN COMPARISON:  Chest x-ray 06/03/2022. Standard CT noncontrast 06/01/2022. Older exams as well. CT angiogram 12/24/2019. FINDINGS: Cardiovascular: There is a descending thoracic aorta stent graft originating along the distal arch. Stent is grossly  patent. No obvious endoleak on this standard CTA without endoleak protocol. There appears to be ectasia of the ascending aorta with diameter approaching 4.8 by 4.6 cm at the  level of the right pulmonary artery. On the study of 2021 the ascending aorta has a diameter of 4.4 by 4.2 cm, slow increase in size. No dissection. There are some calcifications along the aortic valve. The heart is mildly enlarged. There is some left ventricular wall hypertrophy. Trace pericardial fluid. No pulmonary embolism identified. Mediastinum/Nodes: Patulous esophagus with air and fluid. Small hiatal hernia. No specific abnormal lymph node enlargement identified in the axillary region. There are some small subcentimeter hilar and mediastinal nodes. Nonspecific Lungs/Pleura: Small bilateral pleural effusions with adjacent lung opacities. Atelectasis versus infiltrate. These are increased from the study of 06/01/2022. Breathing motion. No pneumothorax. Stable areas of reticulonodular changes in the right upper lobe. Upper Abdomen: Adrenal glands are preserved in the upper abdomen. Distended gallbladder. Musculoskeletal: Scattered degenerative changes along the spine. There is compression of the superior endplate of L1 with some sclerosis. The sclerosis is progressive from the study of 03/02/2022. Please correlate for a late subacute, chronic injury. Review of the MIP images confirms the above findings. IMPRESSION: No pulmonary embolism identified. Thoracic aortic stent graft in place. Persistent enlargement of the ascending aorta. Recommend semi-annual imaging followup by CTA or MRA and referral to cardiothoracic surgery if not already obtained. This recommendation follows 2010 ACCF/AHA/AATS/ACR/ASA/SCA/SCAI/SIR/STS/SVM Guidelines for the Diagnosis and Management of Patients With Thoracic Aortic Disease. Circulation. 2010; [id number]. Aortic aneurysm NOS (ICD10-I71.9) Increasing small pleural effusions and adjacent lung opacities. Atelectasis versus infiltrate. Recommend follow-up Patulous esophagus with air and fluid. Please correlate with any symptoms. Small hiatal hernia. Electronically Signed   By:  Jill Side M.D.   On: 06/04/2022 17:38   DG Chest Portable 1 View  Result Date: 06/03/2022 CLINICAL DATA:  Possible aspiration / Hypoxic EXAM: PORTABLE CHEST 1 VIEW COMPARISON:  February 27, 24. FINDINGS: No substantial change from the prior. No new confluent consolidation. Small bilateral pleural effusions and overlying bibasilar atelectasis better characterized on recent CT chest. No visible pneumothorax. Cardiomediastinal silhouette is unchanged with aortic stent. IMPRESSION: No substantial change from the prior. No new confluent consolidation. Small bilateral pleural effusions and overlying bibasilar atelectasis better characterized on recent CT chest. Electronically Signed   By: Margaretha Sheffield M.D.   On: 06/03/2022 10:40   CT Chest Wo Contrast  Result Date: 06/01/2022 CLINICAL DATA:  Respiratory illness, nondiagnostic xray EXAM: CT CHEST WITHOUT CONTRAST TECHNIQUE: Multidetector CT imaging of the chest was performed following the standard protocol without IV contrast. RADIATION DOSE REDUCTION: This exam was performed according to the departmental dose-optimization program which includes automated exposure control, adjustment of the mA and/or kV according to patient size and/or use of iterative reconstruction technique. COMPARISON:  03/02/2022 FINDINGS: Cardiovascular: No significant coronary artery calcification. Global cardiac size within normal limits. No pericardial effusion. Central pulmonary arteries are of normal caliber. Stable fusiform ascending thoracic aortic aneurysm measuring 4.7 cm in greatest dimension. The descending thoracic aortic stent graft repair has been performed and the descending thoracic aorta is of normal caliber. Superimposed mild aortic atherosclerotic calcification. Mediastinum/Nodes: The thyroid gland is atrophic, unchanged. No pathologic thoracic adenopathy. Esophagus unremarkable. Small hiatal hernia. Lungs/Pleura: Small bilateral pleural effusions are present with  associated bibasilar atelectasis. No superimposed confluent pulmonary infiltrate. Stable nodular scarring within the right upper lobe. No superimposed confluent pulmonary infiltrate. No pneumothorax. No central obstructing lesion. Upper Abdomen: No acute  abnormality. Musculoskeletal: Probable subacute to remote fracture of the left 11 rib laterally with a small amount of surrounding callus formation. No acute bone abnormality. No lytic or blastic bone lesion. Osseous structures are age-appropriate. IMPRESSION: 1. Small bilateral pleural effusions with associated bibasilar atelectasis. No superimposed confluent pulmonary infiltrate. 2. Stable fusiform ascending thoracic aortic aneurysm measuring 4.7 cm in greatest dimension. Status post descending thoracic aortic stent graft repair. Recommend semi-annual imaging followup by CTA or MRA and referral to cardiothoracic surgery if not already obtained. This recommendation follows 2010 ACCF/AHA/AATS/ACR/ASA/SCA/SCAI/SIR/STS/SVM Guidelines for the Diagnosis and Management of Patients With Thoracic Aortic Disease. Circulation. 2010; [id number]. Aortic aneurysm NOS (ICD10-I71.9) 3. Small hiatal hernia. Aortic Atherosclerosis (ICD10-I70.0). Aortic aneurysm NOS (ICD10-I71.9). Electronically Signed   By: Fidela Salisbury M.D.   On: 06/01/2022 23:50   MR BRAIN WO CONTRAST  Result Date: 06/01/2022 CLINICAL DATA:  Initial evaluation for mental status change, unknown cause. EXAM: MRI HEAD WITHOUT CONTRAST TECHNIQUE: Multiplanar, multiecho pulse sequences of the brain and surrounding structures were obtained without intravenous contrast. COMPARISON:  Prior CT from earlier the same day. FINDINGS: Brain: Diffuse prominence of the CSF containing spaces compatible generalized cerebral atrophy. Scattered patchy T2/FLAIR hyperintensity involving the periventricular white matter, most characteristic of chronic microvascular ischemic disease, mild for age. No evidence for acute or  subacute ischemia. Gray-white matter differentiation maintained. No areas of chronic cortical infarction. No acute or chronic intracranial blood products. No mass lesion, midline shift or mass effect. No hydrocephalus or extra-axial fluid collection. Pituitary gland and suprasellar region within normal limits. Vascular: Major intracranial vascular flow voids are maintained. Skull and upper cervical spine: Craniocervical junction normal. Bone marrow signal intensity within normal limits. No scalp soft tissue abnormality. Sinuses/Orbits: Prior bilateral ocular lens replacement. Scattered chronic mucosal thickening present about the ethmoidal air cells and maxillary sinuses. Small bilateral mastoid effusions noted, slightly larger on the left, of doubtful significance. Visualized nasopharynx unremarkable. Other: None. IMPRESSION: 1. No acute intracranial abnormality. 2. Generalized age-related cerebral atrophy with mild chronic small vessel ischemic disease. Electronically Signed   By: Jeannine Boga M.D.   On: 06/01/2022 21:58   DG Chest Portable 1 View  Result Date: 06/01/2022 CLINICAL DATA:  Weakness EXAM: PORTABLE CHEST 1 VIEW COMPARISON:  None Available. FINDINGS: Mild bibasilar atelectasis. Lungs are otherwise clear. No pneumothorax or pleural effusion. Descending thoracic aortic stent graft again noted. Cardiac size within normal limits. Pulmonary vascularity is normal. Osseous structures are age-appropriate. IMPRESSION: No active disease. Electronically Signed   By: Fidela Salisbury M.D.   On: 06/01/2022 17:57   CT ABDOMEN PELVIS W CONTRAST  Result Date: 06/01/2022 CLINICAL DATA:  Possible diverticulitis. Chronic UTI since 12/23. Recent fall 2 days ago. EXAM: CT ABDOMEN AND PELVIS WITH CONTRAST TECHNIQUE: Multidetector CT imaging of the abdomen and pelvis was performed using the standard protocol following bolus administration of intravenous contrast. RADIATION DOSE REDUCTION: This exam was  performed according to the departmental dose-optimization program which includes automated exposure control, adjustment of the mA and/or kV according to patient size and/or use of iterative reconstruction technique. CONTRAST:  182m OMNIPAQUE IOHEXOL 300 MG/ML  SOLN COMPARISON:  10/13/2021, CT chest 03/02/2022 FINDINGS: Lower chest: Very small bilateral pleural effusions with associated bibasilar atelectasis. Patient has aortic stent graft partially visualized over the descending thoracic aorta. Hepatobiliary: Liver and biliary tree are within normal. Possible subtle dependent cholelithiasis. Pancreas: Normal. Spleen: Normal. Adrenals/Urinary Tract: Adrenal glands are normal. Kidneys are normal in size without hydronephrosis. No nephrolithiasis. 1 cm  hypodensity over the mid pole left kidney unchanged and too small to characterize, although likely a cyst. No imaging follow-up recommended. Visualized ureters and bladder are normal. Distal ureters partially obscured by streak artifact from right hip prosthesis. Stomach/Bowel: Stomach and small bowel are normal. Appendix not visualized. Mild fecal retention throughout the colon. Minimal diverticulosis of the sigmoid colon without evidence of diverticulitis. Vascular/Lymphatic: Mild calcified plaque over the abdominal aorta which is normal in caliber. No adenopathy. Reproductive: Previous hysterectomy.  Pessary in place. Other: Tiny umbilical hernia containing only peritoneal fat. Small right inguinal hernia containing only peritoneal fat. Musculoskeletal: Right hip arthroplasty intact. Depression of the superior endplate of L1 compatible with mild compression fracture new since April 2023, although not significantly changed from November 2023. IMPRESSION: 1. No acute findings in the abdomen/pelvis. 2. Minimal diverticulosis of the sigmoid colon without evidence of diverticulitis. 3. Very small bilateral pleural effusions with associated bibasilar atelectasis. 4. 1 cm  left renal cortical hypodensity unchanged and too small to characterize, although likely a cyst. No imaging follow-up recommended. 5. Mild L1 compression fracture new since April 2023, although not significantly changed from November 2023. 6. Aortic stent graft partially visualized over the descending thoracic aorta. 7. Aortic atherosclerosis. 8. Tiny umbilical hernia containing only peritoneal fat. Small right inguinal hernia containing only peritoneal fat. Aortic Atherosclerosis (ICD10-I70.0). Electronically Signed   By: Marin Olp M.D.   On: 06/01/2022 16:28   CT Head Wo Contrast  Result Date: 06/01/2022 CLINICAL DATA:  Chronic urinary tract infection, fell 2 days ago, hit head EXAM: CT HEAD WITHOUT CONTRAST TECHNIQUE: Contiguous axial images were obtained from the base of the skull through the vertex without intravenous contrast. RADIATION DOSE REDUCTION: This exam was performed according to the departmental dose-optimization program which includes automated exposure control, adjustment of the mA and/or kV according to patient size and/or use of iterative reconstruction technique. COMPARISON:  11/21/2020 FINDINGS: Brain: No acute infarct or hemorrhage. Lateral ventricles and midline structures appear unremarkable. No acute extra-axial fluid collection. No mass effect. Vascular: No hyperdense vessel or unexpected calcification. Skull: Normal. Negative for fracture or focal lesion. Sinuses/Orbits: Mild mucoperiosteal thickening within the ethmoid and maxillary sinuses. No gas fluid levels. Other: None. IMPRESSION: 1. No acute intracranial process. Electronically Signed   By: Randa Ngo M.D.   On: 06/01/2022 16:18   DG Sacrum/Coccyx  Result Date: 06/01/2022 CLINICAL DATA:  Fall.  Pain in back EXAM: SACRUM AND COCCYX - 2+ VIEW COMPARISON:  12/16/2020 FINDINGS: Focal area of cortical buckling involving the anterior cortex of the distal sacrum noted. Previous right hip arthroplasty. Status post  bilateral sacroplasty. Bones appear diffusely osteopenic. No sign of acute fracture or dislocation. IMPRESSION: 1. Focal area of cortical buckling involving the anterior cortex of the distal sacrum. Cannot exclude a nondisplaced fracture. 2. Osteopenia. Electronically Signed   By: Kerby Moors M.D.   On: 06/01/2022 14:14    Assessment/Plan Other nonthrombocytopenic purpura (HCC)  Immunosuppression ([place]), Chronic  Dissection of thoracic aorta, unspecified part ([place]), Chronic  Recurrent major depressive disorder, in full remission ([place]), Chronic  Moderate dementia with mood disturbance, unspecified dementia type ([place]) - Plan: Do not attempt resuscitation (DNR)  Aneurysm of ascending aorta without rupture ([place])  Essential hypertension  Peripheral vascular disease ([place])  Acute respiratory failure with hypoxia ([place])  Lung nodule seen on imaging study  Rheumatoid arthritis, involving unspecified site, unspecified whether rheumatoid factor present [facility])  Urinary retention  Polymyalgia rheumatica ([place])  Fall, sequela  History of  fracture of right hip  Moderate episode of recurrent major depressive disorder (HCC)  Malnutrition of moderate degree  Delirium Patient with recent hospitalization after fall and AMS found to have aspiration pneumonia and UTI in the setting of chronic urinary retention likely due to medications. Patient with history of dementia and hospitalization complicated by delirium. She has a complex medical history including aortic aneurysm, multiple unwitnessed falls which led to fracture in 2021 and 2022, peripheral vascular disease who now has a DNR orders. BP well-controlled on current regimen. Depression worsening, consider starting sertraline after a few weeks of transition. Patient would benefit from community dwelling to support her ADLS and for her safety. She has a history of RA and PMR for which she takes MTX and Prednisone which are stable and  well-controlled, however, lead to immunosuppression. Plan to continue Augmentin for 5 days course and Macrobid for 10 day course for treatment of pneumonia and UTI respectively. Continue to monitor for signs of retention. Continue Synthroid for hypothyroid. Continue lipitor. Continue PRN tylenol.  Delirium precautions in place. Protein supplementation with Ensure as tolerated.    Family/ staff Communication: Nursing, Daughter (HCPOA)  Labs/tests ordered:  CBC BMP in 1 week   I spent >45 minutes for the care of the patient with 30 minutes of face to face time, 10 minutes in chart review and 5 minutes discussing goals of care.   Tomasa Rand, MD, [facility] [phone]

## 2022-06-08 ENCOUNTER — Encounter: Payer: Self-pay | Admitting: Student

## 2022-06-08 LAB — CULTURE, BLOOD (ROUTINE X 2)
Culture: NO GROWTH
Culture: NO GROWTH

## 2022-06-09 ENCOUNTER — Ambulatory Visit: Payer: Medicare PPO | Admitting: Podiatry

## 2022-06-10 ENCOUNTER — Non-Acute Institutional Stay: Payer: Medicare PPO | Admitting: Hospice

## 2022-06-10 DIAGNOSIS — R296 Repeated falls: Secondary | ICD-10-CM

## 2022-06-10 DIAGNOSIS — R531 Weakness: Secondary | ICD-10-CM

## 2022-06-10 DIAGNOSIS — N390 Urinary tract infection, site not specified: Secondary | ICD-10-CM

## 2022-06-10 DIAGNOSIS — Z515 Encounter for palliative care: Secondary | ICD-10-CM

## 2022-06-10 DIAGNOSIS — F0390 Unspecified dementia without behavioral disturbance: Secondary | ICD-10-CM

## 2022-06-10 NOTE — Progress Notes (Signed)
[facility] Consult Note Telephone: [phone]  Fax: [phone]  PATIENT NAME: Gloria [name] [address] [place] [address] [phone] (home)  DOB: [birth year] MRN: [id number]  PRIMARY CARE PROVIDER:    Adin Hector, MD,  [address] [place] [address] [phone]  REFERRING PROVIDER:   Dr Dewayne Shorter  RESPONSIBLE PARTY:    Contact Information     Name Relation Home Work [place] Daughter [phone]     Gloria [name] [phone]  [phone]        I met face to face with patient in the facility. Visit to build trust and highlight Palliative Medicine as specialized medical care for people living with serious illness, aimed at facilitating better quality of life through symptoms relief, assisting with advance care planning and complex medical decision making.  Gloria [name] and Gloria [name] are with patient during visit.  Palliative care team will continue to support patient, patient's family, and medical team.  ASSESSMENT AND / RECOMMENDATIONS:  ------------------------------------------------------------------------------------------------------------------------------------------------- Advance Care Planning: Our advance care planning conversation included a discussion about:    The value and importance of advance care planning  Difference between Hospice and Palliative care Exploration of goals of care in the event of a sudden injury or illness  Identification and preparation of a healthcare agent  Review and updating or creation of an  advance directive document . Decision not to resuscitate or to de-escalate disease focused treatments due to poor prognosis.  CODE STATUS: Patient is a Do Not Resuscitate  Goals of Care: Goals include to maximize quality of life and symptom management  I spent 16 minutes providing this initial consultation. More than  50% of the time in this consultation was spent on counseling patient and coordinating communication. --------------------------------------------------------------------------------------------------------------------------------------  Symptom Management/Plan: Recurrent falls: Recent fall 05/29/2022.  Recurrent fall likley related to progressing Dementia and gait disturbance. PT/OT for gait training.  Fall precautions. Generalized weakness: worsened with recent hospitalization 06/01/22 - 06/06/22 for acute respiratory failure with hypoxia related to possible aspiration pneumonia. Treated with antibiotics and discharged to SNF for ongoing care.  Continue PT/OT for strengthening and mobility. Recurrent UTI: recurrent; Recently completed antibiotics for UTI. Continue Nitrofurantion 100 mg daily for prophylaxis.  Routine CBC CMP. Dementia: Worsening dementia with progressive Memory loss/confusion, impoverished thought Continue ongoing supportive care. Fall/safety precautions.   Follow up: Palliative care will continue to follow for complex medical decision making, advance care planning, and clarification of goals. Return 6 weeks or prn. Encouraged to call provider sooner with any concerns.   Family /Caregiver/Community Supports: Patient is in SNF for ongoing care. Family is looking into SNF placement long term due to patient's ongoing decline in functional satatus  HOSPICE ELIGIBILITY/DIAGNOSIS: TBD  Chief Complaint: Initial Palliative care visit  HISTORY OF PRESENT ILLNESS:  Gloria [name] is a 89 y.o. year old female  with multiple morbidities requiring close monitoring/management, and with high risk of complications and  mortality: Dementia, recurrent fall, recurrent UTI, rheumatoid arthritis, collagen vascular disease, hypertension, hyperlipidemia, hypothyroidism, GERD, aneurysm of the ascending aorta status post stent placement.  Patient denies pain/discomfort, FLACC 0. History obtained from  review of EMR, discussion with primary team, caregiver, family and/or Gloria [name].  Review and summarization of Epic records shows history from other than patient. Rest of 10 point ROS asked and negative. Independent interpretation of tests and reviewed as needed, available labs, patient records, imaging, studies and related  documents from the EMR.  PHYSICAL EXAM: GEN: in no acute distress  Cardiac: S1 S2, no LE edema feet/ankles  Respiratory:  clear to auscultation bilaterally, GI: soft, nontender, nondistended, + BS   MS: Moving all extremities, no edema to BLE , gait disturbanceSkin: warm and dry, no rash to visible skin, senile purpura Neuro: generalized weakness, A and O x 2, memory loss Psych: non-anxious affect, cooperative  Recent Labs  Lab 06/04/22 0416 06/05/22 0509 06/06/22 0405  WBC 16.1* 9.7 8.0  HGB 10.0* 8.8* 8.7*  HCT 30.9* 27.0* 27.2*  PLT 203 209 196  MCV 97.2 96.8 97.5   Recent Labs  Lab 06/04/22 0416 06/05/22 0509 06/06/22 0405  NA 136 139 140  K 3.6 3.2* 3.2*  CL 107 109 112*  CO2 23 23 21*  BUN '20 17 16  '$ CREATININE 0.62 0.57 0.49  GLUCOSE 102* 99 93   Latest GFR by Cockcroft Gault (not valid in AKI or ESRD) Estimated Creatinine Clearance: 40.3 mL/min (by C-G formula based on SCr of 0.49 mg/dL). Recent Labs  Lab 06/04/22 0416  AST 17  ALT 10  ALKPHOS 43  x   PAST MEDICAL HISTORY:  Active Ambulatory Problems    Diagnosis Date Noted   Hypothyroidism 02/01/2007   GERD 02/01/2007   Rheumatoid arthritis ([place]) 02/01/2007   Polymyalgia rheumatica ([place]) 04/05/2001   Disorder of bone and cartilage 02/01/2007   COLONIC POLYPS, HX OF 02/01/2007   Anemia 02/14/2018   Essential hypertension 01/21/2014   Peripheral vascular disease ([place]) 09/14/2017   Thoracic ascending aortic aneurysm ([place]) 04/07/2019   Intramural hematoma of thoracic aorta ([place]) 04/08/2019   Closed right hip fracture ([place]) 11/30/2019   Hip fracture ([place]) 11/30/2019   Acute pain  of right shoulder    Fall    Closed displaced midcervical fracture of right femur ([place]) 12/03/2019   History of fracture of right hip 11/30/2019   Immunosuppression ([place]) 05/08/2019   Migraines 02/05/2020   Osteopenia 07/19/2013   Recurrent major depressive disorder, in full remission ([place]) 12/31/2019   Status post hip hemiarthroplasty 12/03/2019   Closed pelvic fracture ([place]) 12/16/2020   HLD (hyperlipidemia) 12/16/2020   Depression 12/16/2020   Pelvic fracture ([place]) 12/18/2020   Acute respiratory failure with hypoxia ([place]) 06/03/2022   Recurrent UTI 06/03/2022   Weakness 06/03/2022   Leukocytosis 06/03/2022   Malnutrition of moderate degree 06/04/2022   Urinary retention 06/05/2022   Other nonthrombocytopenic purpura ([place]) 06/07/2022   Lung nodule seen on imaging study 08/12/2021   Resolved Ambulatory Problems    Diagnosis Date Noted   DISEASE, CELIAC 04/05/2002   Aneurysm of ascending aorta ([place]) 02/14/2015   Past Medical History:  Diagnosis Date   Arthritis    Basal cell carcinoma    Collagen vascular disease ([place])    Heartburn    Hypertension    Squamous cell carcinoma of skin 05/06/2021    SOCIAL HX:  Social History   Tobacco Use   Smoking status: Never   Smokeless tobacco: Never  Substance Use Topics   Alcohol use: No    Alcohol/week: 0.0 standard drinks of alcohol     FAMILY HX:  Family History  Problem Relation Age of Onset   Kidney disease Neg Hx    Bladder Cancer Neg Hx       ALLERGIES:  Allergies  Allergen Reactions   Amoxicillin Other (See Comments)    Intolerant  Daughter and Son report patient tolerated course of Augmentin in January 2024  Codeine Nausea Only   Ibandronate Sodium    Ibandronate Sodium  [Ibandronic Acid] Other (See Comments)    Gi upset   Risedronate Other (See Comments)    Gi upset   Risedronate Sodium    Sulfa Antibiotics Hives      PERTINENT MEDICATIONS:  Outpatient Encounter Medications as of 06/10/2022   Medication Sig   acetaminophen (TYLENOL) 325 MG tablet Take 2 tablets (650 mg total) by mouth every 4 (four) hours as needed for moderate pain.   amoxicillin-clavulanate (AUGMENTIN) 875-125 MG tablet Take 1 tablet by mouth 2 (two) times daily.   atorvastatin (LIPITOR) 20 MG tablet Take 20 mg by mouth daily.   carvedilol (COREG) 6.25 MG tablet Take 6.25 mg by mouth 2 (two) times daily with a meal.   folic acid (FOLVITE) 1 MG tablet Take 1 mg by mouth daily.   levothyroxine (SYNTHROID, LEVOTHROID) 75 MCG tablet Take 75 mcg by mouth daily.    methotrexate (RHEUMATREX) 2.5 MG tablet Take 15 mg by mouth every Friday.   Multiple Vitamin (MULTIVITAMIN WITH MINERALS) TABS tablet Take 1 tablet by mouth daily.   nitrofurantoin, macrocrystal-monohydrate, (MACROBID) 100 MG capsule Take 1 capsule (100 mg total) by mouth daily. (Patient taking differently: Take 100 mg by mouth 2 (two) times daily.)   predniSONE (DELTASONE) 5 MG tablet Take 2.5 mg by mouth daily.   No facility-administered encounter medications on file as of 06/10/2022.     Thank you for the opportunity to participate in the care of Gloria [name].  The palliative care team will continue to follow. Please call our office at [phone] if we can be of additional assistance.   Note: Portions of this note were generated with Lobbyist. Dictation errors may occur despite best attempts at proofreading.  Teodoro Spray, NP

## 2022-06-14 ENCOUNTER — Ambulatory Visit: Payer: Medicare PPO | Admitting: Urology

## 2022-06-14 LAB — CBC AND DIFFERENTIAL
HCT: 30 — AB (ref 36–46)
Hemoglobin: 9.8 — AB (ref 12.0–16.0)
Neutrophils Absolute: 9670
Platelets: 308 10*3/uL (ref 150–400)
WBC: 14.2

## 2022-06-14 LAB — BASIC METABOLIC PANEL
BUN: 9 (ref 4–21)
CO2: 26 — AB (ref 13–22)
Chloride: 106 (ref 99–108)
Creatinine: 0.5 (ref 0.5–1.1)
Glucose: 84
Potassium: 3.5 mEq/L (ref 3.5–5.1)
Sodium: 140 (ref 137–147)

## 2022-06-14 LAB — CBC: RBC: 3.19 — AB (ref 3.87–5.11)

## 2022-06-14 LAB — COMPREHENSIVE METABOLIC PANEL
Calcium: 7.9 — AB (ref 8.7–10.7)
eGFR: 88

## 2022-06-22 ENCOUNTER — Non-Acute Institutional Stay: Payer: Medicare PPO | Admitting: Hospice

## 2022-06-22 DIAGNOSIS — Z515 Encounter for palliative care: Secondary | ICD-10-CM

## 2022-06-22 DIAGNOSIS — F0390 Unspecified dementia without behavioral disturbance: Secondary | ICD-10-CM

## 2022-06-22 DIAGNOSIS — R296 Repeated falls: Secondary | ICD-10-CM

## 2022-06-22 DIAGNOSIS — K5901 Slow transit constipation: Secondary | ICD-10-CM

## 2022-06-22 DIAGNOSIS — R531 Weakness: Secondary | ICD-10-CM

## 2022-06-22 DIAGNOSIS — N390 Urinary tract infection, site not specified: Secondary | ICD-10-CM

## 2022-06-22 NOTE — Progress Notes (Signed)
[facility] Consult Note Telephone: [phone]  Fax: [phone]  PATIENT NAME: Gloria [name] [address] [place] [address] [phone] (home)  DOB: [birth year] MRN: [id number]  PRIMARY CARE PROVIDER:    Adin Hector, MD,  [address] [place] [address] [phone]  REFERRING PROVIDER:   Dr Dewayne Shorter  RESPONSIBLE PARTY:    Contact Information     Name Relation Home Work [place] Daughter [phone]     Gloria [name] [phone]  [phone]        I met face to face with patient in the facility. Visit to build trust and highlight Palliative Medicine as specialized medical care for people living with serious illness, aimed at facilitating better quality of life through symptoms relief, assisting with advance care planning and complex medical decision making.  Gloria [name] is with patient during visit.  Palliative care team will continue to support patient, patient's family, and medical team.  ASSESSMENT AND / RECOMMENDATIONS:  Symptom Management/Plan: Recurrent falls: No fall since last visit. Recent fall 05/29/2022. Continue PT/OT for gait training.  Fall precautions. Generalized weakness: Continue PT/OT for strengthening and mobility. Recurrent UTI: Recently completed antibiotics for UTI. Continue Nitrofurantion 100 mg daily for prophylaxis.  Dementia: Worsening dementia with progressive Memory loss/confusion, impoverished thought. Recently moved from [facility] living to skilled living.  Continue ongoing supportive care. Fall/safety precautions.   Constipation: initiate Senna S 8.6/50mg  daily.  Miralax 17 gram BID PRN constipation x 2 days. Back off with diarrhea.  Monitor for bowel movement regularity.   Follow up: Palliative care will continue to follow for complex medical decision making, advance care planning, and clarification of goals.  Return 6 weeks or prn. Encouraged to call provider sooner with any concerns.   Family /Caregiver/Community Supports: Patient is in SNF for ongoing care. Family is looking into SNF placement long term due to patient's ongoing decline in functional satatus  HOSPICE ELIGIBILITY/DIAGNOSIS: TBD  Chief Complaint: F/u visit  HISTORY OF PRESENT ILLNESS:  Gloria [name] is a 89 y.o. year old female  with multiple morbidities requiring close monitoring/management, and with high risk of complications and  mortality: Dementia, recurrent fall, recurrent UTI, rheumatoid arthritis, collagen vascular disease, hypertension, hyperlipidemia, hypothyroidism, GERD, aneurysm of the ascending aorta status post stent placement.  Patient denies pain/discomfort, FLACC 0; reports 'no bowel movement in a couple of days'. Daughter expressed concern that patient was started on Calcium and this gave patient constipation in the past. Nurse Gloria [name] alerted ro monitor for possible constipation.  History obtained from review of EMR, discussion with primary team, caregiver, family and/or Gloria [name].  Review and summarization of Epic records shows history from other than patient. Rest of 10 point ROS asked and negative. Independent interpretation of tests and reviewed as needed, available labs, patient records, imaging, studies and related documents from the EMR.  PHYSICAL EXAM: GEN: in no acute distress  Cardiac: S1 S2, no LE edema feet/ankles  Respiratory:  clear to auscultation bilaterally, GI: soft, nontender, nondistended, + BS   MS: Moving all extremities, gait disturbance Skin: fragile skin, warm and dry, no rash to visible skin, senile purpura Neuro: generalized weakness, A and O x 2, memory loss Psych: non-anxious affect, cooperative    PAST MEDICAL HISTORY:  Active Ambulatory Problems    Diagnosis Date Noted   Hypothyroidism 02/01/2007   GERD 02/01/2007   Rheumatoid arthritis ([place]) 02/01/2007    Polymyalgia  rheumatica ([place]) 04/05/2001   Disorder of bone and cartilage 02/01/2007   COLONIC POLYPS, HX OF 02/01/2007   Anemia 02/14/2018   Essential hypertension 01/21/2014   Peripheral vascular disease ([place]) 09/14/2017   Thoracic ascending aortic aneurysm ([place]) 04/07/2019   Intramural hematoma of thoracic aorta ([place]) 04/08/2019   Closed right hip fracture ([place]) 11/30/2019   Hip fracture ([place]) 11/30/2019   Acute pain of right shoulder    Fall    Closed displaced midcervical fracture of right femur ([place]) 12/03/2019   History of fracture of right hip 11/30/2019   Immunosuppression ([place]) 05/08/2019   Migraines 02/05/2020   Osteopenia 07/19/2013   Recurrent major depressive disorder, in full remission ([place]) 12/31/2019   Status post hip hemiarthroplasty 12/03/2019   Closed pelvic fracture ([place]) 12/16/2020   HLD (hyperlipidemia) 12/16/2020   Depression 12/16/2020   Pelvic fracture ([place]) 12/18/2020   Acute respiratory failure with hypoxia ([place]) 06/03/2022   Recurrent UTI 06/03/2022   Weakness 06/03/2022   Leukocytosis 06/03/2022   Malnutrition of moderate degree 06/04/2022   Urinary retention 06/05/2022   Other nonthrombocytopenic purpura ([place]) 06/07/2022   Lung nodule seen on imaging study 08/12/2021   Resolved Ambulatory Problems    Diagnosis Date Noted   DISEASE, CELIAC 04/05/2002   Aneurysm of ascending aorta ([place]) 02/14/2015   Past Medical History:  Diagnosis Date   Arthritis    Basal cell carcinoma    Collagen vascular disease ([place])    Heartburn    Hypertension    Squamous cell carcinoma of skin 05/06/2021    SOCIAL HX:  Social History   Tobacco Use   Smoking status: Never   Smokeless tobacco: Never  Substance Use Topics   Alcohol use: No    Alcohol/week: 0.0 standard drinks of alcohol     FAMILY HX:  Family History  Problem Relation Age of Onset   Kidney disease Neg Hx    Bladder Cancer Neg Hx       ALLERGIES:  Allergies  Allergen Reactions    Amoxicillin Other (See Comments)    Intolerant  Daughter and Son report patient tolerated course of Augmentin in January 2024   Codeine Nausea Only   Ibandronate Sodium    Ibandronate Sodium  [Ibandronic Acid] Other (See Comments)    Gi upset   Risedronate Other (See Comments)    Gi upset   Risedronate Sodium    Sulfa Antibiotics Hives      PERTINENT MEDICATIONS:  Outpatient Encounter Medications as of 06/22/2022  Medication Sig   acetaminophen (TYLENOL) 325 MG tablet Take 2 tablets (650 mg total) by mouth every 4 (four) hours as needed for moderate pain.   amoxicillin-clavulanate (AUGMENTIN) 875-125 MG tablet Take 1 tablet by mouth 2 (two) times daily.   atorvastatin (LIPITOR) 20 MG tablet Take 20 mg by mouth daily.   carvedilol (COREG) 6.25 MG tablet Take 6.25 mg by mouth 2 (two) times daily with a meal.   folic acid (FOLVITE) 1 MG tablet Take 1 mg by mouth daily.   levothyroxine (SYNTHROID, LEVOTHROID) 75 MCG tablet Take 75 mcg by mouth daily.    methotrexate (RHEUMATREX) 2.5 MG tablet Take 15 mg by mouth every Friday.   Multiple Vitamin (MULTIVITAMIN WITH MINERALS) TABS tablet Take 1 tablet by mouth daily.   nitrofurantoin, macrocrystal-monohydrate, (MACROBID) 100 MG capsule Take 1 capsule (100 mg total) by mouth daily. (Patient taking differently: Take 100 mg by mouth 2 (two) times daily.)   predniSONE (DELTASONE) 5 MG tablet Take 2.5  mg by mouth daily.   No facility-administered encounter medications on file as of 06/22/2022.     Thank you for the opportunity to participate in the care of Gloria [name].  The palliative care team will continue to follow. Please call our office at [phone] if we can be of additional assistance.   Note: Portions of this note were generated with Lobbyist. Dictation errors may occur despite best attempts at proofreading.  Teodoro Spray, NP

## 2022-06-28 ENCOUNTER — Ambulatory Visit: Payer: Medicare PPO | Admitting: Urology

## 2022-06-28 ENCOUNTER — Encounter: Payer: Self-pay | Admitting: Student

## 2022-06-28 ENCOUNTER — Non-Acute Institutional Stay: Payer: Medicare PPO | Admitting: Student

## 2022-06-28 DIAGNOSIS — R296 Repeated falls: Secondary | ICD-10-CM | POA: Diagnosis not present

## 2022-06-28 DIAGNOSIS — F331 Major depressive disorder, recurrent, moderate: Secondary | ICD-10-CM | POA: Diagnosis not present

## 2022-06-28 DIAGNOSIS — F03B3 Unspecified dementia, moderate, with mood disturbance: Secondary | ICD-10-CM | POA: Diagnosis not present

## 2022-06-28 DIAGNOSIS — S72101A Unspecified trochanteric fracture of right femur, initial encounter for closed fracture: Secondary | ICD-10-CM | POA: Diagnosis not present

## 2022-06-28 NOTE — Progress Notes (Unsigned)
Location:  Other [place].  Nursing Home Room Number: [place] of Service:  SNF [phone]) Provider:  Dr. Dewayne Shorter  PCP: Adin Hector, MD  Patient Care Team: Adin Hector, MD as PCP - General (Internal Medicine)  Extended Emergency Contact Information Primary Emergency Contact: Gloria [name] of [place] Phone: [phone] Relation: Daughter Secondary Emergency Contact: Gloria [name] Phone: [phone] Mobile Phone: [phone] Relation: Son  Code Status:  DNR Goals of care: Advanced Directive information    06/28/2022   11:07 AM  Advanced Directives  Does Patient Have a Medical Advance Directive? Yes  Type of Advance Directive Out of facility DNR (pink MOST or yellow form)  Does patient want to make changes to medical advance directive? No - Patient declined     Chief Complaint  Patient presents with   Acute Visit    Fall with Fracture.     HPI:  Pt is a 89 y.o. female seen today for an acute visit for evaluation after an unwitnessed fall.   X-ray from the weekend showed small right trochanteric fracture. No pain at this time.   She has had visitors and played games, however, she does not recall. Her daughter has set up regular visitors and interactive activities for the forseeable future. She misses her cat. Has been depressed before and on medications in the past.   Patient's dementia has progressed to the point where she cannot adequately discuss the fracture and reprocussions of treatment/interventions. Discussed these concerns with patient's daughter and HCPOA. She worries that given her mother's current state and health, that any surgical intervention could cause more harm than good. She would prefer to monitor and her mother continue supportive care rather than a surgery. Concern that a surgery would be a physical setback, and that she may not survive from a cardiac standpoint. Does not desire an orthopedic  consult at this time.   Past Medical History:  Diagnosis Date   Arthritis    Basal cell carcinoma    Left chin, right nasolabial   Collagen vascular disease (HCC)    RA   Heartburn    Hypertension    Squamous cell carcinoma of skin 05/06/2021   left hand, treated with [facility]   Past Surgical History:  Procedure Laterality Date   ABDOMINAL HYSTERECTOMY     BREAST CYST EXCISION     HIP ARTHROPLASTY Right 11/30/2019   Procedure: ARTHROPLASTY BIPOLAR HIP (HEMIARTHROPLASTY);  Surgeon: Corky Mull, MD;  Location: [facility] ORS;  Service: Orthopedics;  Laterality: Right;   SACROPLASTY N/A 12/17/2020   Procedure: SACROPLASTY;  Surgeon: Hessie Knows, MD;  Location: [facility] ORS;  Service: Orthopedics;  Laterality: N/A;   THORACIC AORTIC ENDOVASCULAR STENT GRAFT N/A 04/11/2019   Procedure: THORACIC AORTIC ENDOVASCULAR STENT GRAFT insertion;  Surgeon: Serafina Mitchell, MD;  Location: [facility] OR;  Service: Vascular;  Laterality: N/A;   ULTRASOUND GUIDANCE FOR VASCULAR ACCESS Right 04/11/2019   Procedure: Ultrasound Guidance For Vascular Access, right femoral artery;  Surgeon: Serafina Mitchell, MD;  Location: [place];  Service: Vascular;  Laterality: Right;    Allergies  Allergen Reactions   Amoxicillin Other (See Comments)    Intolerant  Daughter and Son report patient tolerated course of Augmentin in January 2024   Codeine Nausea Only   Ibandronate Sodium    Ibandronate Sodium  [Ibandronic Acid] Other (See Comments)    Gi upset   Risedronate Other (See Comments)    Gi upset  Risedronate Sodium    Sulfa Antibiotics Hives    Outpatient Encounter Medications as of 06/28/2022  Medication Sig   acetaminophen (TYLENOL) 325 MG tablet Take 2 tablets (650 mg total) by mouth every 4 (four) hours as needed for moderate pain.   atorvastatin (LIPITOR) 20 MG tablet Take 20 mg by mouth daily.   Calcium Carbonate (CALCIUM 600 PO) Give one tablet by mouth two times daily.   carvedilol (COREG) 6.25 MG tablet Take 6.25  mg by mouth 2 (two) times daily with a meal.   folic acid (FOLVITE) 1 MG tablet Take 1 mg by mouth daily.   levothyroxine (SYNTHROID, LEVOTHROID) 75 MCG tablet Take 75 mcg by mouth daily.    methotrexate (RHEUMATREX) 2.5 MG tablet Take 15 mg by mouth every Friday.   Multiple Vitamin (MULTIVITAMIN WITH MINERALS) TABS tablet Take 1 tablet by mouth daily.   nitrofurantoin, macrocrystal-monohydrate, (MACROBID) 100 MG capsule Take 1 capsule (100 mg total) by mouth daily.   predniSONE (DELTASONE) 5 MG tablet Take 2.5 mg by mouth daily.   Propylene Glycol (SYSTANE BALANCE OP) Instill one drop in both eyes every 12 hours as needed for dry eyes.   senna (SENOKOT) 8.6 MG TABS tablet Take 1 tablet by mouth daily.   sertraline (ZOLOFT) 50 MG tablet Take 50 mg by mouth daily. Give 1/2 tablet by mouth at bedtime until 3/27   [DISCONTINUED] amoxicillin-clavulanate (AUGMENTIN) 875-125 MG tablet Take 1 tablet by mouth 2 (two) times daily.   No facility-administered encounter medications on file as of 06/28/2022.    Review of Systems  Immunization History  Administered Date(s) Administered   Influenza Split 01/21/2014, 12/04/2015   Influenza, High Dose Seasonal PF 01/31/2017, 12/18/2018   Influenza-Unspecified 01/27/2015, 12/18/2018, 01/01/2020, 01/05/2021, 01/19/2022   Moderna Covid-19 Vaccine Bivalent Booster 74yrs & up 09/01/2021   Moderna Sars-Covid-2 Vaccination 04/23/2019, 05/21/2019, 02/19/2020, 07/11/2020, 01/29/2021   Pneumococcal Conjugate-13 03/15/2014   Pneumococcal Polysaccharide-23 04/05/2001, 08/13/2016   RSV,unspecified 01/19/2022   Td 07/04/2005   Td (Adult),5 Lf Tetanus Toxid, Preservative Free 02/15/2017   Tdap 07/27/2005, 02/15/2017   Zoster Recombinat (Shingrix) 12/29/2018, 06/10/2019   Zoster, Live 04/05/2006   Pertinent  Health Maintenance Due  Topic Date Due   DEXA SCAN  Never done   INFLUENZA VACCINE  Completed      12/17/2020    9:00 AM 12/17/2020    2:42 PM 12/18/2020     2:00 AM 12/18/2020    1:00 PM 03/28/2022    6:10 AM  Fall Risk  (RETIRED) Patient Fall Risk Level High fall risk High fall risk High fall risk High fall risk High fall risk   Functional Status Survey:    Vitals:   06/28/22 1035 06/28/22 1109  BP: (!) 140/73 (!) 158/72  Pulse: 60   Resp: 16   Temp: (!) 97 F (36.1 C)   SpO2: 93%   Weight: 147 lb 9.6 oz (67 kg)   Height: 5\' 7"  (1.702 m)    Body mass index is 23.12 kg/m. Physical Exam Constitutional:      Comments: Lying in bed  Cardiovascular:     Rate and Rhythm: Normal rate.     Pulses: Normal pulses.  Pulmonary:     Effort: Pulmonary effort is normal.  Skin:    General: Skin is warm and dry.  Neurological:     Mental Status: She is alert. Mental status is at baseline. She is disoriented.  Psychiatric:     Comments: Depressed mood  Labs reviewed: Recent Labs    06/03/22 1818 06/04/22 0416 06/05/22 0505 06/05/22 0509 06/06/22 0405 06/14/22 0000  NA  --  136  --  139 140 140  K  --  3.6  --  3.2* 3.2* 3.5  CL  --  107  --  109 112* 106  CO2  --  23  --  23 21* 26*  GLUCOSE  --  102*  --  99 93  --   BUN  --  20  --  17 16 9   CREATININE  --  0.62  --  0.57 0.49 0.5  CALCIUM  --  7.9*  --  8.1* 8.0* 7.9*  MG 2.1  --  2.4  --   --   --    Recent Labs    06/01/22 1238 06/03/22 1117 06/04/22 0416  AST 25 22 17   ALT 16 13 10   ALKPHOS 60 51 43  BILITOT 1.3* 1.5* 1.3*  PROT 7.0 6.1* 5.2*  ALBUMIN 3.5 2.9* 2.4*   Recent Labs    06/01/22 1238 06/03/22 1117 06/03/22 1819 06/04/22 0416 06/05/22 0509 06/06/22 0405 06/14/22 0000  WBC 22.3* 18.2*   < > 16.1* 9.7 8.0 14.2  NEUTROABS 19.4* 15.1*  --   --   --   --  9,670.00  HGB 11.5* 11.4*   < > 10.0* 8.8* 8.7* 9.8*  HCT 35.9* 36.0   < > 30.9* 27.0* 27.2* 30*  MCV 98.6 100.6*   < > 97.2 96.8 97.5  --   PLT 232 205   < > 203 209 196 308   < > = values in this interval not displayed.   Lab Results  Component Value Date   TSH 0.124 (L) 04/07/2019    No results found for: "HGBA1C" Lab Results  Component Value Date   CHOL 207 (H) 04/07/2019   HDL 83 04/07/2019   LDLCALC 112 (H) 04/07/2019   TRIG 59 04/07/2019   CHOLHDL 2.5 04/07/2019    Significant Diagnostic Results in last 30 days:  CT Angio Chest Pulmonary Embolism (PE) W or WO Contrast  Result Date: 06/04/2022 CLINICAL DATA:  Hypoxia.  Elevated D-dimer. EXAM: CT ANGIOGRAPHY CHEST WITH CONTRAST TECHNIQUE: Multidetector CT imaging of the chest was performed using the standard protocol during bolus administration of intravenous contrast. Multiplanar CT image reconstructions and MIPs were obtained to evaluate the vascular anatomy. RADIATION DOSE REDUCTION: This exam was performed according to the departmental dose-optimization program which includes automated exposure control, adjustment of the mA and/or kV according to patient size and/or use of iterative reconstruction technique. CONTRAST:  16mL OMNIPAQUE IOHEXOL 350 MG/ML SOLN COMPARISON:  Chest x-ray 06/03/2022. Standard CT noncontrast 06/01/2022. Older exams as well. CT angiogram 12/24/2019. FINDINGS: Cardiovascular: There is a descending thoracic aorta stent graft originating along the distal arch. Stent is grossly patent. No obvious endoleak on this standard CTA without endoleak protocol. There appears to be ectasia of the ascending aorta with diameter approaching 4.8 by 4.6 cm at the level of the right pulmonary artery. On the study of 2021 the ascending aorta has a diameter of 4.4 by 4.2 cm, slow increase in size. No dissection. There are some calcifications along the aortic valve. The heart is mildly enlarged. There is some left ventricular wall hypertrophy. Trace pericardial fluid. No pulmonary embolism identified. Mediastinum/Nodes: Patulous esophagus with air and fluid. Small hiatal hernia. No specific abnormal lymph node enlargement identified in the axillary region. There are some small subcentimeter hilar and  mediastinal nodes.  Nonspecific Lungs/Pleura: Small bilateral pleural effusions with adjacent lung opacities. Atelectasis versus infiltrate. These are increased from the study of 06/01/2022. Breathing motion. No pneumothorax. Stable areas of reticulonodular changes in the right upper lobe. Upper Abdomen: Adrenal glands are preserved in the upper abdomen. Distended gallbladder. Musculoskeletal: Scattered degenerative changes along the spine. There is compression of the superior endplate of L1 with some sclerosis. The sclerosis is progressive from the study of 03/02/2022. Please correlate for a late subacute, chronic injury. Review of the MIP images confirms the above findings. IMPRESSION: No pulmonary embolism identified. Thoracic aortic stent graft in place. Persistent enlargement of the ascending aorta. Recommend semi-annual imaging followup by CTA or MRA and referral to cardiothoracic surgery if not already obtained. This recommendation follows 2010 ACCF/AHA/AATS/ACR/ASA/SCA/SCAI/SIR/STS/SVM Guidelines for the Diagnosis and Management of Patients With Thoracic Aortic Disease. Circulation. 2010; [id number]. Aortic aneurysm NOS (ICD10-I71.9) Increasing small pleural effusions and adjacent lung opacities. Atelectasis versus infiltrate. Recommend follow-up Patulous esophagus with air and fluid. Please correlate with any symptoms. Small hiatal hernia. Electronically Signed   By: Jill Side M.D.   On: 06/04/2022 17:38   DG Chest Portable 1 View  Result Date: 06/03/2022 CLINICAL DATA:  Possible aspiration / Hypoxic EXAM: PORTABLE CHEST 1 VIEW COMPARISON:  February 27, 24. FINDINGS: No substantial change from the prior. No new confluent consolidation. Small bilateral pleural effusions and overlying bibasilar atelectasis better characterized on recent CT chest. No visible pneumothorax. Cardiomediastinal silhouette is unchanged with aortic stent. IMPRESSION: No substantial change from the prior. No new confluent consolidation. Small  bilateral pleural effusions and overlying bibasilar atelectasis better characterized on recent CT chest. Electronically Signed   By: Margaretha Sheffield M.D.   On: 06/03/2022 10:40   CT Chest Wo Contrast  Result Date: 06/01/2022 CLINICAL DATA:  Respiratory illness, nondiagnostic xray EXAM: CT CHEST WITHOUT CONTRAST TECHNIQUE: Multidetector CT imaging of the chest was performed following the standard protocol without IV contrast. RADIATION DOSE REDUCTION: This exam was performed according to the departmental dose-optimization program which includes automated exposure control, adjustment of the mA and/or kV according to patient size and/or use of iterative reconstruction technique. COMPARISON:  03/02/2022 FINDINGS: Cardiovascular: No significant coronary artery calcification. Global cardiac size within normal limits. No pericardial effusion. Central pulmonary arteries are of normal caliber. Stable fusiform ascending thoracic aortic aneurysm measuring 4.7 cm in greatest dimension. The descending thoracic aortic stent graft repair has been performed and the descending thoracic aorta is of normal caliber. Superimposed mild aortic atherosclerotic calcification. Mediastinum/Nodes: The thyroid gland is atrophic, unchanged. No pathologic thoracic adenopathy. Esophagus unremarkable. Small hiatal hernia. Lungs/Pleura: Small bilateral pleural effusions are present with associated bibasilar atelectasis. No superimposed confluent pulmonary infiltrate. Stable nodular scarring within the right upper lobe. No superimposed confluent pulmonary infiltrate. No pneumothorax. No central obstructing lesion. Upper Abdomen: No acute abnormality. Musculoskeletal: Probable subacute to remote fracture of the left 11 rib laterally with a small amount of surrounding callus formation. No acute bone abnormality. No lytic or blastic bone lesion. Osseous structures are age-appropriate. IMPRESSION: 1. Small bilateral pleural effusions with  associated bibasilar atelectasis. No superimposed confluent pulmonary infiltrate. 2. Stable fusiform ascending thoracic aortic aneurysm measuring 4.7 cm in greatest dimension. Status post descending thoracic aortic stent graft repair. Recommend semi-annual imaging followup by CTA or MRA and referral to cardiothoracic surgery if not already obtained. This recommendation follows 2010 ACCF/AHA/AATS/ACR/ASA/SCA/SCAI/SIR/STS/SVM Guidelines for the Diagnosis and Management of Patients With Thoracic Aortic Disease. Circulation. 2010; [id number]. Aortic aneurysm NOS (  ICD10-I71.9) 3. Small hiatal hernia. Aortic Atherosclerosis (ICD10-I70.0). Aortic aneurysm NOS (ICD10-I71.9). Electronically Signed   By: Fidela Salisbury M.D.   On: 06/01/2022 23:50   MR BRAIN WO CONTRAST  Result Date: 06/01/2022 CLINICAL DATA:  Initial evaluation for mental status change, unknown cause. EXAM: MRI HEAD WITHOUT CONTRAST TECHNIQUE: Multiplanar, multiecho pulse sequences of the brain and surrounding structures were obtained without intravenous contrast. COMPARISON:  Prior CT from earlier the same day. FINDINGS: Brain: Diffuse prominence of the CSF containing spaces compatible generalized cerebral atrophy. Scattered patchy T2/FLAIR hyperintensity involving the periventricular white matter, most characteristic of chronic microvascular ischemic disease, mild for age. No evidence for acute or subacute ischemia. Gray-white matter differentiation maintained. No areas of chronic cortical infarction. No acute or chronic intracranial blood products. No mass lesion, midline shift or mass effect. No hydrocephalus or extra-axial fluid collection. Pituitary gland and suprasellar region within normal limits. Vascular: Major intracranial vascular flow voids are maintained. Skull and upper cervical spine: Craniocervical junction normal. Bone marrow signal intensity within normal limits. No scalp soft tissue abnormality. Sinuses/Orbits: Prior bilateral  ocular lens replacement. Scattered chronic mucosal thickening present about the ethmoidal air cells and maxillary sinuses. Small bilateral mastoid effusions noted, slightly larger on the left, of doubtful significance. Visualized nasopharynx unremarkable. Other: None. IMPRESSION: 1. No acute intracranial abnormality. 2. Generalized age-related cerebral atrophy with mild chronic small vessel ischemic disease. Electronically Signed   By: Jeannine Boga M.D.   On: 06/01/2022 21:58   DG Chest Portable 1 View  Result Date: 06/01/2022 CLINICAL DATA:  Weakness EXAM: PORTABLE CHEST 1 VIEW COMPARISON:  None Available. FINDINGS: Mild bibasilar atelectasis. Lungs are otherwise clear. No pneumothorax or pleural effusion. Descending thoracic aortic stent graft again noted. Cardiac size within normal limits. Pulmonary vascularity is normal. Osseous structures are age-appropriate. IMPRESSION: No active disease. Electronically Signed   By: Fidela Salisbury M.D.   On: 06/01/2022 17:57   CT ABDOMEN PELVIS W CONTRAST  Result Date: 06/01/2022 CLINICAL DATA:  Possible diverticulitis. Chronic UTI since 12/23. Recent fall 2 days ago. EXAM: CT ABDOMEN AND PELVIS WITH CONTRAST TECHNIQUE: Multidetector CT imaging of the abdomen and pelvis was performed using the standard protocol following bolus administration of intravenous contrast. RADIATION DOSE REDUCTION: This exam was performed according to the departmental dose-optimization program which includes automated exposure control, adjustment of the mA and/or kV according to patient size and/or use of iterative reconstruction technique. CONTRAST:  166mL OMNIPAQUE IOHEXOL 300 MG/ML  SOLN COMPARISON:  10/13/2021, CT chest 03/02/2022 FINDINGS: Lower chest: Very small bilateral pleural effusions with associated bibasilar atelectasis. Patient has aortic stent graft partially visualized over the descending thoracic aorta. Hepatobiliary: Liver and biliary tree are within normal. Possible  subtle dependent cholelithiasis. Pancreas: Normal. Spleen: Normal. Adrenals/Urinary Tract: Adrenal glands are normal. Kidneys are normal in size without hydronephrosis. No nephrolithiasis. 1 cm hypodensity over the mid pole left kidney unchanged and too small to characterize, although likely a cyst. No imaging follow-up recommended. Visualized ureters and bladder are normal. Distal ureters partially obscured by streak artifact from right hip prosthesis. Stomach/Bowel: Stomach and small bowel are normal. Appendix not visualized. Mild fecal retention throughout the colon. Minimal diverticulosis of the sigmoid colon without evidence of diverticulitis. Vascular/Lymphatic: Mild calcified plaque over the abdominal aorta which is normal in caliber. No adenopathy. Reproductive: Previous hysterectomy.  Pessary in place. Other: Tiny umbilical hernia containing only peritoneal fat. Small right inguinal hernia containing only peritoneal fat. Musculoskeletal: Right hip arthroplasty intact. Depression of the superior endplate  of L1 compatible with mild compression fracture new since April 2023, although not significantly changed from November 2023. IMPRESSION: 1. No acute findings in the abdomen/pelvis. 2. Minimal diverticulosis of the sigmoid colon without evidence of diverticulitis. 3. Very small bilateral pleural effusions with associated bibasilar atelectasis. 4. 1 cm left renal cortical hypodensity unchanged and too small to characterize, although likely a cyst. No imaging follow-up recommended. 5. Mild L1 compression fracture new since April 2023, although not significantly changed from November 2023. 6. Aortic stent graft partially visualized over the descending thoracic aorta. 7. Aortic atherosclerosis. 8. Tiny umbilical hernia containing only peritoneal fat. Small right inguinal hernia containing only peritoneal fat. Aortic Atherosclerosis (ICD10-I70.0). Electronically Signed   By: Marin Olp M.D.   On: 06/01/2022  16:28   CT Head Wo Contrast  Result Date: 06/01/2022 CLINICAL DATA:  Chronic urinary tract infection, fell 2 days ago, hit head EXAM: CT HEAD WITHOUT CONTRAST TECHNIQUE: Contiguous axial images were obtained from the base of the skull through the vertex without intravenous contrast. RADIATION DOSE REDUCTION: This exam was performed according to the departmental dose-optimization program which includes automated exposure control, adjustment of the mA and/or kV according to patient size and/or use of iterative reconstruction technique. COMPARISON:  11/21/2020 FINDINGS: Brain: No acute infarct or hemorrhage. Lateral ventricles and midline structures appear unremarkable. No acute extra-axial fluid collection. No mass effect. Vascular: No hyperdense vessel or unexpected calcification. Skull: Normal. Negative for fracture or focal lesion. Sinuses/Orbits: Mild mucoperiosteal thickening within the ethmoid and maxillary sinuses. No gas fluid levels. Other: None. IMPRESSION: 1. No acute intracranial process. Electronically Signed   By: Randa Ngo M.D.   On: 06/01/2022 16:18   DG Sacrum/Coccyx  Result Date: 06/01/2022 CLINICAL DATA:  Fall.  Pain in back EXAM: SACRUM AND COCCYX - 2+ VIEW COMPARISON:  12/16/2020 FINDINGS: Focal area of cortical buckling involving the anterior cortex of the distal sacrum noted. Previous right hip arthroplasty. Status post bilateral sacroplasty. Bones appear diffusely osteopenic. No sign of acute fracture or dislocation. IMPRESSION: 1. Focal area of cortical buckling involving the anterior cortex of the distal sacrum. Cannot exclude a nondisplaced fracture. 2. Osteopenia. Electronically Signed   By: Kerby Moors M.D.   On: 06/01/2022 14:14    Assessment/Plan 1. Closed fracture of trochanter of right femur, initial encounter [facility]) Patient with closed fracture less than 1 cm. Unable to maintain NWB status given dementia. Will continue supportive care and instate supportive measures  to prevent falls and maintain safety. No pain. No indications for osteoporosis treatment given prognosis less than 2 years.   2. Recurrent falls Numerous falls at home and one fall on admission. Undetermined number. Continue physical therapy as tolerated. Stay in  nursing facility for therapy and 24 hour supervision.   3. Moderate episode of recurrent major depressive disorder [facility]) Patient has had difficulty with mood since transitioning to long term care. Family supportive and continues to help with social support, however, given history and transition, will plan to start zoloft 25 mg for 3 days then increase to 50 mg daily. F/u for mood changes.   4. Moderate dementia with mood disturbance, unspecified dementia type ([place]) Continues to have decline in memory and function. Weight stable. Patient has DNR in place. Continue supportive care.    Family/ staff Communication: Daughter, nursing  Labs/tests ordered:  none

## 2022-06-30 ENCOUNTER — Ambulatory Visit: Payer: Medicare PPO | Admitting: Podiatry

## 2022-06-30 DIAGNOSIS — B351 Tinea unguium: Secondary | ICD-10-CM | POA: Diagnosis not present

## 2022-06-30 DIAGNOSIS — M79675 Pain in left toe(s): Secondary | ICD-10-CM

## 2022-06-30 DIAGNOSIS — M79674 Pain in right toe(s): Secondary | ICD-10-CM | POA: Diagnosis not present

## 2022-07-01 NOTE — Progress Notes (Signed)
  Subjective:  Patient ID: Gloria [name], female    DOB: [birth year],  MRN: [id number]  Chief Complaint  Patient presents with   Nail Problem    Thick painful toenails, 3 month follow up    89 y.o. female returns with the above complaint. History confirmed with patient.  Nails are now becoming painful again, routine regular debridement has been helpful in reducing pain and improving function.  Objective:  Physical Exam: warm, good capillary refill, no trophic changes or ulcerative lesions, normal DP and PT pulses and normal sensory exam. Onychomycosis of multiple nails with thickened elongated nail plates with yellow discoloration subungual debris,      Assessment:   1. Pain due to onychomycosis of toenails of both feet       Plan:  Patient was evaluated and treated and all questions answered.  Discussed the etiology and treatment options for the condition in detail with the patient.  Regular debridement has been helpful.. Recommended debridement of the nails today. Sharp and mechanical debridement performed of all painful and mycotic nails today. Nails debrided in length and thickness using a nail nipper and a mechanical burr to level of comfort. Discussed treatment options including appropriate shoe gear.        Return in about 11 weeks (around 09/15/2022) for painful thick fungal nails.

## 2022-07-21 ENCOUNTER — Encounter: Payer: Self-pay | Admitting: Student

## 2022-07-21 ENCOUNTER — Non-Acute Institutional Stay (SKILLED_NURSING_FACILITY): Payer: Medicare PPO | Admitting: Student

## 2022-07-21 DIAGNOSIS — E440 Moderate protein-calorie malnutrition: Secondary | ICD-10-CM | POA: Diagnosis not present

## 2022-07-21 DIAGNOSIS — M069 Rheumatoid arthritis, unspecified: Secondary | ICD-10-CM | POA: Diagnosis not present

## 2022-07-21 DIAGNOSIS — M353 Polymyalgia rheumatica: Secondary | ICD-10-CM

## 2022-07-21 DIAGNOSIS — F3342 Major depressive disorder, recurrent, in full remission: Secondary | ICD-10-CM

## 2022-07-21 DIAGNOSIS — I10 Essential (primary) hypertension: Secondary | ICD-10-CM | POA: Diagnosis not present

## 2022-07-21 DIAGNOSIS — S72101D Unspecified trochanteric fracture of right femur, subsequent encounter for closed fracture with routine healing: Secondary | ICD-10-CM

## 2022-07-21 NOTE — Progress Notes (Unsigned)
Location:  Other [place].  Nursing Home Room Number: [facility] [address] Place of Service:  SNF [phone]) Provider:  Dr. Earnestine Mealing  PCP: Lynnea Ferrier, MD  Patient Care Team: Lynnea Ferrier, MD as PCP - General (Internal Medicine)  Extended Emergency Contact Information Primary Emergency Contact: Verna Czech of [place] Home Phone: [phone] Relation: Daughter Secondary Emergency Contact: Aurelio Jew Home Phone: [phone] Mobile Phone: [phone] Relation: Son  Code Status:  DNR Goals of care: Advanced Directive information    07/21/2022   10:04 AM  Advanced Directives  Does Patient Have a Medical Advance Directive? Yes  Type of Advance Directive Out of facility DNR (pink MOST or yellow form)  Does patient want to make changes to medical advance directive? No - Patient declined     Chief Complaint  Patient presents with   Medical Management of Chronic Issues    Medical Management of Chronic Issues.     HPI:  Pt is a 89 y.o. female seen today for medical management of chronic diseases.     Past Medical History:  Diagnosis Date   Arthritis    Basal cell carcinoma    Left chin, right nasolabial   Collagen vascular disease    RA   Heartburn    Hypertension    Squamous cell carcinoma of skin 05/06/2021   left hand, treated with [facility]   Past Surgical History:  Procedure Laterality Date   ABDOMINAL HYSTERECTOMY     BREAST CYST EXCISION     HIP ARTHROPLASTY Right 11/30/2019   Procedure: ARTHROPLASTY BIPOLAR HIP (HEMIARTHROPLASTY);  Surgeon: Christena Flake, MD;  Location: [facility] ORS;  Service: Orthopedics;  Laterality: Right;   SACROPLASTY N/A 12/17/2020   Procedure: SACROPLASTY;  Surgeon: Kennedy Bucker, MD;  Location: [facility] ORS;  Service: Orthopedics;  Laterality: N/A;   THORACIC AORTIC ENDOVASCULAR STENT GRAFT N/A 04/11/2019   Procedure: THORACIC AORTIC ENDOVASCULAR STENT GRAFT insertion;  Surgeon: Nada Libman, MD;  Location: [facility]  OR;  Service: Vascular;  Laterality: N/A;   ULTRASOUND GUIDANCE FOR VASCULAR ACCESS Right 04/11/2019   Procedure: Ultrasound Guidance For Vascular Access, right femoral artery;  Surgeon: Nada Libman, MD;  Location: [facility] OR;  Service: Vascular;  Laterality: Right;    Allergies  Allergen Reactions   Amoxicillin Other (See Comments)    Intolerant  Daughter and Son report patient tolerated course of Augmentin in January 2024   Codeine Nausea Only   Ibandronate Sodium    Ibandronate Sodium  [Ibandronic Acid] Other (See Comments)    Gi upset   Risedronate Other (See Comments)    Gi upset   Risedronate Sodium    Sulfa Antibiotics Hives    Outpatient Encounter Medications as of 07/21/2022  Medication Sig   acetaminophen (TYLENOL) 325 MG tablet Take 2 tablets (650 mg total) by mouth every 4 (four) hours as needed for moderate pain.   atorvastatin (LIPITOR) 20 MG tablet Take 20 mg by mouth daily.   Calcium Carbonate (CALCIUM 600 PO) Give one tablet by mouth two times daily.   carvedilol (COREG) 6.25 MG tablet Take 6.25 mg by mouth 2 (two) times daily with a meal.   folic acid (FOLVITE) 1 MG tablet Take 1 mg by mouth daily.   levothyroxine (SYNTHROID, LEVOTHROID) 75 MCG tablet Take 75 mcg by mouth daily.    methotrexate (RHEUMATREX) 2.5 MG tablet Take 15 mg by mouth every Friday.   Multiple Vitamin (MULTIVITAMIN WITH MINERALS) TABS tablet Take 1  tablet by mouth daily.   nitrofurantoin, macrocrystal-monohydrate, (MACROBID) 100 MG capsule Take 1 capsule (100 mg total) by mouth daily.   predniSONE (DELTASONE) 5 MG tablet Take 2.5 mg by mouth daily.   Propylene Glycol (SYSTANE BALANCE OP) Instill one drop in both eyes every 12 hours as needed for dry eyes.   senna (SENOKOT) 8.6 MG TABS tablet Take 1 tablet by mouth daily.   sertraline (ZOLOFT) 50 MG tablet Take 50 mg by mouth daily.   No facility-administered encounter medications on file as of 07/21/2022.    Review of Systems  Immunization  History  Administered Date(s) Administered   Influenza Split 01/21/2014, 12/04/2015   Influenza, High Dose Seasonal PF 01/31/2017, 12/18/2018   Influenza-Unspecified 01/27/2015, 12/18/2018, 01/01/2020, 01/05/2021, 01/19/2022   Moderna Covid-19 Vaccine Bivalent Booster 43yrs & up 09/01/2021   Moderna Sars-Covid-2 Vaccination 04/23/2019, 05/21/2019, 02/19/2020, 07/11/2020, 01/29/2021   Pneumococcal Conjugate-13 03/15/2014   Pneumococcal Polysaccharide-23 04/05/2001, 08/13/2016   RSV,unspecified 01/19/2022   Td 07/04/2005   Td (Adult),5 Lf Tetanus Toxid, Preservative Free 02/15/2017   Tdap 07/27/2005, 02/15/2017   Zoster Recombinat (Shingrix) 12/29/2018, 06/10/2019   Zoster, Live 04/05/2006   Pertinent  Health Maintenance Due  Topic Date Due   DEXA SCAN  Never done   INFLUENZA VACCINE  11/04/2022      12/17/2020    9:00 AM 12/17/2020    2:42 PM 12/18/2020    2:00 AM 12/18/2020    1:00 PM 03/28/2022    6:10 AM  Fall Risk  (RETIRED) Patient Fall Risk Level High fall risk High fall risk High fall risk High fall risk High fall risk   Functional Status Survey:    Vitals:   07/21/22 0954  BP: (!) 96/51  Pulse: (!) 57  Resp: 18  Temp: (!) 96.7 F (35.9 C)  SpO2: 100%  Weight: 144 lb 9.6 oz (65.6 kg)  Height:  (1.702 m)   Body mass index is 22.65 kg/m. Physical Exam  Labs reviewed: Recent Labs    06/03/22 1818 06/04/22 0416 06/05/22 0505 06/05/22 0509 06/06/22 0405 06/14/22 0000  NA  --  136  --  139 140 140  K  --  3.6  --  3.2* 3.2* 3.5  CL  --  107  --  109 112* 106  CO2  --  23  --  23 21* 26*  GLUCOSE  --  102*  --  99 93  --   BUN  --  20  --  CREATININE  --  0.62  --  0.57 0.49 0.5  CALCIUM  --  7.9*  --  8.1* 8.0* 7.9*  MG 2.1  --  2.4  --   --   --    Recent Labs    06/01/22 1238 06/03/22 1117 06/04/22 0416  AST ALT ALKPHOS 60 51 43  BILITOT 1.3* 1.5* 1.3*  PROT 7.0 6.1* 5.2*  ALBUMIN 3.5 2.9* 2.4*   Recent  Labs    06/01/22 1238 06/03/22 1117 06/03/22 1819 06/04/22 0416 06/05/22 0509 06/06/22 0405 06/14/22 0000  WBC 22.3* 18.2*   < > 16.1* 9.7 8.0 14.2  NEUTROABS 19.4* 15.1*  --   --   --   --  9,670.00  HGB 11.5* 11.4*   < > 10.0* 8.8* 8.7* 9.8*  HCT 35.9* 36.0   < > 30.9* 27.0* 27.2* 30*  MCV 98.6 100.6*   < > 97.2 96.8  97.5  --   PLT 232 205   < > 203 209 196 308   < > = values in this interval not displayed.   Lab Results  Component Value Date   TSH 0.124 (L) 04/07/2019   No results found for: "HGBA1C" Lab Results  Component Value Date   CHOL 207 (H) 04/07/2019   HDL 83 04/07/2019   LDLCALC 112 (H) 04/07/2019   TRIG 59 04/07/2019   CHOLHDL 2.5 04/07/2019    Significant Diagnostic Results in last 30 days:  No results found.  Assessment/Plan There are no diagnoses linked to this encounter.   Family/ staff Communication: ***  Labs/tests ordered:  ***

## 2022-07-22 ENCOUNTER — Encounter: Payer: Self-pay | Admitting: Student

## 2022-07-26 ENCOUNTER — Non-Acute Institutional Stay (SKILLED_NURSING_FACILITY): Payer: Medicare PPO | Admitting: Student

## 2022-07-26 ENCOUNTER — Encounter: Payer: Self-pay | Admitting: Student

## 2022-07-26 DIAGNOSIS — M7989 Other specified soft tissue disorders: Secondary | ICD-10-CM | POA: Diagnosis not present

## 2022-07-26 DIAGNOSIS — R296 Repeated falls: Secondary | ICD-10-CM | POA: Diagnosis not present

## 2022-07-26 NOTE — Progress Notes (Unsigned)
Location:  Other [place].  Nursing Home Room Number: [facility] [address] Place of Service:  SNF [phone]) Provider:  Earnestine Mealing, MD  Patient Care Team: Earnestine Mealing, MD as PCP - General (Family Medicine)  Extended Emergency Contact Information Primary Emergency Contact: Verna Czech of [place] Home Phone: [phone] Relation: Daughter Secondary Emergency Contact: Aurelio Jew Home Phone: [phone] Mobile Phone: [phone] Relation: Son  Code Status:  DNR Goals of care: Advanced Directive information    07/26/2022   10:22 AM  Advanced Directives  Does Patient Have a Medical Advance Directive? Yes  Type of Advance Directive Out of facility DNR (pink MOST or yellow form)  Does patient want to make changes to medical advance directive? No - Patient declined     Chief Complaint  Patient presents with   Acute Visit    Legs Weeping.     HPI:  Pt is a 89 y.o. female seen today for an acute visit for legs weaping. Patient has had notable edema now has weeping from the skin. She has also had an unwitnessed fall yesterday which she does not recall. She states her mood is better and she is more used to living here.    Past Medical History:  Diagnosis Date   Arthritis    Basal cell carcinoma    Left chin, right nasolabial   Collagen vascular disease    RA   Heartburn    Hypertension    Squamous cell carcinoma of skin 05/06/2021   left hand, treated with [facility]   Past Surgical History:  Procedure Laterality Date   ABDOMINAL HYSTERECTOMY     BREAST CYST EXCISION     HIP ARTHROPLASTY Right 11/30/2019   Procedure: ARTHROPLASTY BIPOLAR HIP (HEMIARTHROPLASTY);  Surgeon: Christena Flake, MD;  Location: [facility] ORS;  Service: Orthopedics;  Laterality: Right;   SACROPLASTY N/A 12/17/2020   Procedure: SACROPLASTY;  Surgeon: Kennedy Bucker, MD;  Location: [facility] ORS;  Service: Orthopedics;  Laterality: N/A;   THORACIC AORTIC ENDOVASCULAR STENT GRAFT N/A 04/11/2019    Procedure: THORACIC AORTIC ENDOVASCULAR STENT GRAFT insertion;  Surgeon: Nada Libman, MD;  Location: [facility] OR;  Service: Vascular;  Laterality: N/A;   ULTRASOUND GUIDANCE FOR VASCULAR ACCESS Right 04/11/2019   Procedure: Ultrasound Guidance For Vascular Access, right femoral artery;  Surgeon: Nada Libman, MD;  Location: [facility] OR;  Service: Vascular;  Laterality: Right;    Allergies  Allergen Reactions   Amoxicillin Other (See Comments)    Intolerant  Daughter and Son report patient tolerated course of Augmentin in January 2024   Codeine Nausea Only   Ibandronate Sodium    Ibandronate Sodium  [Ibandronic Acid] Other (See Comments)    Gi upset   Risedronate Other (See Comments)    Gi upset   Risedronate Sodium    Sulfa Antibiotics Hives    Outpatient Encounter Medications as of 07/26/2022  Medication Sig   acetaminophen (TYLENOL) 325 MG tablet Take 2 tablets (650 mg total) by mouth every 4 (four) hours as needed for moderate pain.   atorvastatin (LIPITOR) 20 MG tablet Take 20 mg by mouth daily.   Calcium Carbonate (CALCIUM 600 PO) Give one tablet by mouth two times daily.   carvedilol (COREG) 6.25 MG tablet Take 6.25 mg by mouth 2 (two) times daily with a meal.   folic acid (FOLVITE) 1 MG tablet Take 1 mg by mouth daily.   levothyroxine (SYNTHROID, LEVOTHROID) 75 MCG tablet Take 75 mcg by mouth daily.  methotrexate (RHEUMATREX) 2.5 MG tablet Take 15 mg by mouth every Friday.   Multiple Vitamin (MULTIVITAMIN WITH MINERALS) TABS tablet Take 1 tablet by mouth daily.   nitrofurantoin, macrocrystal-monohydrate, (MACROBID) 100 MG capsule Take 1 capsule (100 mg total) by mouth daily.   predniSONE (DELTASONE) 5 MG tablet Take 2.5 mg by mouth daily.   Propylene Glycol (SYSTANE BALANCE OP) Instill one drop in both eyes every 12 hours as needed for dry eyes.   senna (SENOKOT) 8.6 MG TABS tablet Take 1 tablet by mouth daily.   sertraline (ZOLOFT) 50 MG tablet Take 50 mg by mouth daily.    No facility-administered encounter medications on file as of 07/26/2022.    Review of Systems  Immunization History  Administered Date(s) Administered   Influenza Split 01/21/2014, 12/04/2015   Influenza, High Dose Seasonal PF 01/31/2017, 12/18/2018   Influenza-Unspecified 01/27/2015, 12/18/2018, 01/01/2020, 01/05/2021, 01/19/2022   Moderna Covid-19 Vaccine Bivalent Booster 12yrs & up 09/01/2021   Moderna Sars-Covid-2 Vaccination 04/23/2019, 05/21/2019, 02/19/2020, 07/11/2020, 01/29/2021   Pneumococcal Conjugate-13 03/15/2014   Pneumococcal Polysaccharide-23 04/05/2001, 08/13/2016   RSV,unspecified 01/19/2022   Td 07/04/2005   Td (Adult),5 Lf Tetanus Toxid, Preservative Free 02/15/2017   Tdap 07/27/2005, 02/15/2017   Zoster Recombinat (Shingrix) 12/29/2018, 06/10/2019   Zoster, Live 04/05/2006   Pertinent  Health Maintenance Due  Topic Date Due   DEXA SCAN  Never done   INFLUENZA VACCINE  11/04/2022      12/17/2020    9:00 AM 12/17/2020    2:42 PM 12/18/2020    2:00 AM 12/18/2020    1:00 PM 03/28/2022    6:10 AM  Fall Risk  (RETIRED) Patient Fall Risk Level High fall risk High fall risk High fall risk High fall risk High fall risk   Functional Status Survey:    Vitals:   07/26/22 1015 07/26/22 1122  BP: (!) 140/70 (!) 143/72  Pulse: 80   Resp: 18   Temp: 97.9 F (36.6 C)   SpO2: 100%   Weight: 144 lb 9.6 oz (65.6 kg)   Height:  (1.702 m)    Body mass index is 22.65 kg/m. Physical Exam  Labs reviewed: Recent Labs    06/03/22 1818 06/04/22 0416 06/05/22 0505 06/05/22 0509 06/06/22 0405 06/14/22 0000  NA  --  136  --  139 140 140  K  --  3.6  --  3.2* 3.2* 3.5  CL  --  107  --  109 112* 106  CO2  --  23  --  23 21* 26*  GLUCOSE  --  102*  --  99 93  --   BUN  --  20  --  CREATININE  --  0.62  --  0.57 0.49 0.5  CALCIUM  --  7.9*  --  8.1* 8.0* 7.9*  MG 2.1  --  2.4  --   --   --    Recent Labs    06/01/22 1238 06/03/22 1117  06/04/22 0416  AST ALT ALKPHOS 60 51 43  BILITOT 1.3* 1.5* 1.3*  PROT 7.0 6.1* 5.2*  ALBUMIN 3.5 2.9* 2.4*   Recent Labs    06/01/22 1238 06/03/22 1117 06/03/22 1819 06/04/22 0416 06/05/22 0509 06/06/22 0405 06/14/22 0000  WBC 22.3* 18.2*   < > 16.1* 9.7 8.0 14.2  NEUTROABS 19.4* 15.1*  --   --   --   --  9,670.00  HGB 11.5*  11.4*   < > 10.0* 8.8* 8.7* 9.8*  HCT 35.9* 36.0   < > 30.9* 27.0* 27.2* 30*  MCV 98.6 100.6*   < > 97.2 96.8 97.5  --   PLT 232 205   < > 203 209 196 308   < > = values in this interval not displayed.   Lab Results  Component Value Date   TSH 0.124 (L) 04/07/2019   No results found for: "HGBA1C" Lab Results  Component Value Date   CHOL 207 (H) 04/07/2019   HDL 83 04/07/2019   LDLCALC 112 (H) 04/07/2019   TRIG 59 04/07/2019   CHOLHDL 2.5 04/07/2019    Significant Diagnostic Results in last 30 days:  No results found.  Assessment/Plan Leg swelling  Recurrent falls Patient with increased leg swelling. Most likely dependent edema. Echocardiogram in 2021 showed normal EF, however, this was many years ago. Lungs clear to auscultation. Swelling symmetric. Will plan for conservative management with compression and elevation. If no improvement, will consider additional labs and treatment with diuretics. Skin failure does show evidence of decline. Patient's albumin 1 month ago 2.4, appetite has been poor since the move. Will continue goals of care conversations with family as they would prefer minimal interventions per previous conversations.    Family/ staff Communication: nursing  Labs/tests ordered:  none

## 2022-08-10 LAB — CBC AND DIFFERENTIAL
HCT: 33 — AB (ref 36–46)
Hemoglobin: 10.5 — AB (ref 12.0–16.0)
Platelets: 242 10*3/uL (ref 150–400)
WBC: 8.8

## 2022-08-10 LAB — CBC: RBC: 3.44 — AB (ref 3.87–5.11)

## 2022-08-10 LAB — COMPREHENSIVE METABOLIC PANEL
Albumin: 3.2 — AB (ref 3.5–5.0)
eGFR: 83

## 2022-08-10 LAB — HEPATIC FUNCTION PANEL
ALT: 9 U/L (ref 7–35)
AST: 17 (ref 13–35)
Alkaline Phosphatase: 77 (ref 25–125)
Bilirubin, Total: 0.5

## 2022-08-10 LAB — POCT ERYTHROCYTE SEDIMENTATION RATE, NON-AUTOMATED: Sed Rate: 39

## 2022-08-10 LAB — BASIC METABOLIC PANEL: Creatinine: 0.6 (ref 0.5–1.1)

## 2022-08-11 ENCOUNTER — Ambulatory Visit: Payer: Medicare PPO | Admitting: Dermatology

## 2022-08-11 ENCOUNTER — Encounter: Payer: Self-pay | Admitting: Dermatology

## 2022-08-11 VITALS — BP 160/80

## 2022-08-11 DIAGNOSIS — L821 Other seborrheic keratosis: Secondary | ICD-10-CM

## 2022-08-11 DIAGNOSIS — L820 Inflamed seborrheic keratosis: Secondary | ICD-10-CM

## 2022-08-11 DIAGNOSIS — D1801 Hemangioma of skin and subcutaneous tissue: Secondary | ICD-10-CM

## 2022-08-11 DIAGNOSIS — W908XXA Exposure to other nonionizing radiation, initial encounter: Secondary | ICD-10-CM

## 2022-08-11 DIAGNOSIS — L853 Xerosis cutis: Secondary | ICD-10-CM

## 2022-08-11 DIAGNOSIS — Z8589 Personal history of malignant neoplasm of other organs and systems: Secondary | ICD-10-CM

## 2022-08-11 DIAGNOSIS — Z79899 Other long term (current) drug therapy: Secondary | ICD-10-CM

## 2022-08-11 DIAGNOSIS — L814 Other melanin hyperpigmentation: Secondary | ICD-10-CM

## 2022-08-11 DIAGNOSIS — Z1283 Encounter for screening for malignant neoplasm of skin: Secondary | ICD-10-CM

## 2022-08-11 DIAGNOSIS — Z85828 Personal history of other malignant neoplasm of skin: Secondary | ICD-10-CM

## 2022-08-11 DIAGNOSIS — Z872 Personal history of diseases of the skin and subcutaneous tissue: Secondary | ICD-10-CM

## 2022-08-11 DIAGNOSIS — I872 Venous insufficiency (chronic) (peripheral): Secondary | ICD-10-CM

## 2022-08-11 DIAGNOSIS — L209 Atopic dermatitis, unspecified: Secondary | ICD-10-CM

## 2022-08-11 DIAGNOSIS — L2089 Other atopic dermatitis: Secondary | ICD-10-CM

## 2022-08-11 DIAGNOSIS — X32XXXA Exposure to sunlight, initial encounter: Secondary | ICD-10-CM

## 2022-08-11 DIAGNOSIS — L578 Other skin changes due to chronic exposure to nonionizing radiation: Secondary | ICD-10-CM

## 2022-08-11 MED ORDER — TRIAMCINOLONE ACETONIDE 0.1 % EX CREA: 1.0000 | TOPICAL_CREAM | CUTANEOUS | 1 refills | Status: DC

## 2022-08-11 NOTE — Patient Instructions (Signed)
Cryotherapy Aftercare  Wash gently with soap and water everyday.   Apply Vaseline and Band-Aid daily until healed.     Due to recent changes in healthcare laws, you may see results of your pathology and/or laboratory studies on MyChart before the doctors have had a chance to review them. We understand that in some cases there may be results that are confusing or concerning to you. Please understand that not all results are received at the same time and often the doctors may need to interpret multiple results in order to provide you with the best plan of care or course of treatment. Therefore, we ask that you please give us 2 business days to thoroughly review all your results before contacting the office for clarification. Should we see a critical lab result, you will be contacted sooner.   If You Need Anything After Your Visit  If you have any questions or concerns for your doctor, please call our main line at [phone] and press option 4 to reach your doctor's medical assistant. If no one answers, please leave a voicemail as directed and we will return your call as soon as possible. Messages left after 4 pm will be answered the following business day.   You may also send us a message via MyChart. We typically respond to MyChart messages within 1-2 business days.  For prescription refills, please ask your pharmacy to contact our office. Our fax number is [phone].  If you have an urgent issue when the clinic is closed that cannot wait until the next business day, you can page your doctor at the number below.    Please note that while we do our best to be available for urgent issues outside of office hours, we are not available 24/7.   If you have an urgent issue and are unable to reach us, you may choose to seek medical care at your doctor's office, retail clinic, urgent care center, or emergency room.  If you have a medical emergency, please immediately call 911 or go to the  emergency department.  Pager Numbers  - Dr. Kowalski: [phone]  - Dr. Moye: [phone]  - Dr. Stewart: [phone]  In the event of inclement weather, please call our main line at [phone] for an update on the status of any delays or closures.  Dermatology Medication Tips: Please keep the boxes that topical medications come in in order to help keep track of the instructions about where and how to use these. Pharmacies typically print the medication instructions only on the boxes and not directly on the medication tubes.   If your medication is too expensive, please contact our office at [phone] option 4 or send us a message through MyChart.   We are unable to tell what your co-pay for medications will be in advance as this is different depending on your insurance coverage. However, we may be able to find a substitute medication at lower cost or fill out paperwork to get insurance to cover a needed medication.   If a prior authorization is required to get your medication covered by your insurance company, please allow us 1-2 business days to complete this process.  Drug prices often vary depending on where the prescription is filled and some pharmacies may offer cheaper prices.  The website www.goodrx.com contains coupons for medications through different pharmacies. The prices here do not account for what the cost may be with help from insurance (it may be cheaper with your insurance), but the website can   give you the price if you did not use any insurance.  - You can print the associated coupon and take it with your prescription to the pharmacy.  - You may also stop by our office during regular business hours and pick up a GoodRx coupon card.  - If you need your prescription sent electronically to a different pharmacy, notify our office through [facility] MyChart or by phone at [phone] option 4.     Si Usted Necesita Algo Despus de Su Visita  Tambin puede  enviarnos un mensaje a travs de MyChart. Por lo general respondemos a los mensajes de MyChart en el transcurso de 1 a 2 das hbiles.  Para renovar recetas, por favor pida a su farmacia que se ponga en contacto con nuestra oficina. Nuestro nmero de fax es el [phone].  Si tiene un asunto urgente cuando la clnica est cerrada y que no puede esperar hasta el siguiente da hbil, puede llamar/localizar a su doctor(a) al nmero que aparece a continuacin.   Por favor, tenga en cuenta que aunque hacemos todo lo posible para estar disponibles para asuntos urgentes fuera del horario de oficina, no estamos disponibles las 24 horas del da, los 7 das de la semana.   Si tiene un problema urgente y no puede comunicarse con nosotros, puede optar por buscar atencin mdica  en el consultorio de su doctor(a), en una clnica privada, en un centro de atencin urgente o en una sala de emergencias.  Si tiene una emergencia mdica, por favor llame inmediatamente al 911 o vaya a la sala de emergencias.  Nmeros de bper  - Dr. Kowalski: [phone]  - Dra. Moye: [phone]  - Dra. Stewart: [phone]  En caso de inclemencias del tiempo, por favor llame a nuestra lnea principal al [phone] para una actualizacin sobre el estado de cualquier retraso o cierre.  Consejos para la medicacin en dermatologa: Por favor, guarde las cajas en las que vienen los medicamentos de uso tpico para ayudarle a seguir las instrucciones sobre dnde y cmo usarlos. Las farmacias generalmente imprimen las instrucciones del medicamento slo en las cajas y no directamente en los tubos del medicamento.   Si su medicamento es muy caro, por favor, pngase en contacto con nuestra oficina llamando al [phone] y presione la opcin 4 o envenos un mensaje a travs de MyChart.   No podemos decirle cul ser su copago por los medicamentos por adelantado ya que esto es diferente dependiendo de la cobertura de su seguro.  Sin embargo, es posible que podamos encontrar un medicamento sustituto a menor costo o llenar un formulario para que el seguro cubra el medicamento que se considera necesario.   Si se requiere una autorizacin previa para que su compaa de seguros cubra su medicamento, por favor permtanos de 1 a 2 das hbiles para completar este proceso.  Los precios de los medicamentos varan con frecuencia dependiendo del lugar de dnde se surte la receta y alguna farmacias pueden ofrecer precios ms baratos.  El sitio web www.goodrx.com tiene cupones para medicamentos de diferentes farmacias. Los precios aqu no tienen en cuenta lo que podra costar con la ayuda del seguro (puede ser ms barato con su seguro), pero el sitio web puede darle el precio si no utiliz ningn seguro.  - Puede imprimir el cupn correspondiente y llevarlo con su receta a la farmacia.  - Tambin puede pasar por nuestra oficina durante el horario de atencin regular y recoger una tarjeta de cupones de GoodRx.  -   Si necesita que su receta se enve electrnicamente a una farmacia diferente, informe a nuestra oficina a travs de MyChart de [facility] o por telfono llamando al [phone] y presione la opcin 4.  

## 2022-08-11 NOTE — Progress Notes (Signed)
Follow-Up Visit   Subjective  Gloria [name] is a 89 y.o. female who presents for the following: Skin Cancer Screening and Full Body Skin Exam, hx of BCC, SCC, AK, pruritus all over ~3-6m, check spots neck, inframammary, hx of swelling legs, red and sore, wearing compression socks  Patient accompanied by daughter who contributes to history.  The patient presents for Total-Body Skin Exam (TBSE) for skin cancer screening and mole check. The patient has spots, moles and lesions to be evaluated, some may be new or changing and the patient has concerns that these could be cancer.    The following portions of the chart were reviewed this encounter and updated as appropriate: medications, allergies, medical history  Review of Systems:  No other skin or systemic complaints except as noted in HPI or Assessment and Plan.  Objective  Well appearing patient in no apparent distress; mood and affect are within normal limits.  A full examination was performed including scalp, head, eyes, ears, nose, lips, neck, chest, axillae, abdomen, back, buttocks, bilateral upper extremities, bilateral lower extremities, hands, feet, fingers, toes, fingernails, and toenails. All findings within normal limits unless otherwise noted below.   Relevant physical exam findings are noted in the Assessment and Plan.  neck, back x 15 (15) Stuck on waxy paps with erythema    Assessment & Plan   LENTIGINES, SEBORRHEIC KERATOSES, HEMANGIOMAS - Benign normal skin lesions - Benign-appearing - Call for any changes  MELANOCYTIC NEVI - Tan-brown and/or pink-flesh-colored symmetric macules and papules - Benign appearing on exam today - Observation - Call clinic for new or changing moles - Recommend daily use of broad spectrum spf 30+ sunscreen to sun-exposed areas.   ACTINIC DAMAGE - Chronic condition, secondary to cumulative UV/sun exposure - diffuse scaly erythematous macules with underlying dyspigmentation -  Recommend daily broad spectrum sunscreen SPF 30+ to sun-exposed areas, reapply every 2 hours as needed.  - Staying in the shade or wearing long sleeves, sun glasses (UVA+UVB protection) and wide brim hats (4-inch brim around the entire circumference of the hat) are also recommended for sun protection.  - Call for new or changing lesions.  SKIN CANCER SCREENING PERFORMED TODAY.  HISTORY OF BASAL CELL CARCINOMA OF THE SKIN - No evidence of recurrence today - Recommend regular full body skin exams - Recommend daily broad spectrum sunscreen SPF 30+ to sun-exposed areas, reapply every 2 hours as needed.  - Call if any new or changing lesions are noted between office visits  - L chin, R nasolabial  HISTORY OF SQUAMOUS CELL CARCINOMA OF THE SKIN - No evidence of recurrence today - No lymphadenopathy - Recommend regular full body skin exams - Recommend daily broad spectrum sunscreen SPF 30+ to sun-exposed areas, reapply every 2 hours as needed.  - Call if any new or changing lesions are noted between office visits - L hand  Inflamed seborrheic keratosis (15) neck, back x 15  Symptomatic, irritating, patient would like treated.   Destruction of lesion - neck, back x 15 Complexity: simple   Destruction method: cryotherapy   Informed consent: discussed and consent obtained   Timeout:  patient name, date of birth, surgical site, and procedure verified Lesion destroyed using liquid nitrogen: Yes   Region frozen until ice ball extended beyond lesion: Yes   Outcome: patient tolerated procedure well with no complications   Post-procedure details: wound care instructions given     STASIS DERMATITIS Exam: Erythematous, scaly patches involving the ankle and distal lower leg  with associated lower legs with pitting edema.   Stasis in the legs causes chronic leg swelling, which may result in itchy or painful rashes, skin discoloration, skin texture changes, and sometimes ulceration.  Recommend  daily graduated compression hose/stockings- easiest to put on first thing in morning, remove at bedtime.  Elevate legs as much as possible. Avoid salt/sodium rich foods.  Treatment Plan: Cont Compression socks qd  Start TMC 0.1% cr 3d/wk Monday, Wednesday, Friday  Topical steroids (such as triamcinolone, fluocinolone, fluocinonide, mometasone, clobetasol, halobetasol, betamethasone, hydrocortisone) can cause thinning and lightening of the skin if they are used for too long in the same area. Your physician has selected the right strength medicine for your problem and area affected on the body. Please use your medication only as directed by your physician to prevent side effects.    ATOPIC DERMATITIS Exam: Scaly pink papules coalescing to plaques,trunk, arms, legs 60% BSA    Atopic dermatitis (eczema) is a chronic, relapsing, pruritic condition that can significantly affect quality of life. It is often associated with allergic rhinitis and/or asthma and can require treatment with topical medications, phototherapy, or in severe cases biologic injectable medication (Dupixent; Adbry) or Oral JAK inhibitors.  Treatment Plan: Start TMC 0.1% cr 3d/wk Monday, Wednesday, Friday to trunk, extremities, avoid f/g/a Start Cerave Cream 4d/wk Tuesday, Thursday, Saturday, Sunday  Recommend gentle skin care.   Xerosis - diffuse xerotic patches - recommend gentle, hydrating skin care - gentle skin care handout given   Return for 6-8 wks ISK f/u, Stasis derm f/u.  I, Ardis Rowan, RMA, am acting as scribe for Armida Sans, MD .   Documentation: I have reviewed the above documentation for accuracy and completeness, and I agree with the above.  Armida Sans, MD

## 2022-08-12 ENCOUNTER — Non-Acute Institutional Stay (SKILLED_NURSING_FACILITY): Payer: Medicare PPO | Admitting: Nurse Practitioner

## 2022-08-12 ENCOUNTER — Encounter: Payer: Self-pay | Admitting: Nurse Practitioner

## 2022-08-12 DIAGNOSIS — M353 Polymyalgia rheumatica: Secondary | ICD-10-CM

## 2022-08-12 DIAGNOSIS — E440 Moderate protein-calorie malnutrition: Secondary | ICD-10-CM | POA: Diagnosis not present

## 2022-08-12 DIAGNOSIS — M069 Rheumatoid arthritis, unspecified: Secondary | ICD-10-CM

## 2022-08-12 DIAGNOSIS — D649 Anemia, unspecified: Secondary | ICD-10-CM

## 2022-08-12 DIAGNOSIS — F03B3 Unspecified dementia, moderate, with mood disturbance: Secondary | ICD-10-CM

## 2022-08-12 DIAGNOSIS — I10 Essential (primary) hypertension: Secondary | ICD-10-CM

## 2022-08-12 DIAGNOSIS — F3342 Major depressive disorder, recurrent, in full remission: Secondary | ICD-10-CM | POA: Diagnosis not present

## 2022-08-12 DIAGNOSIS — M7989 Other specified soft tissue disorders: Secondary | ICD-10-CM

## 2022-08-12 NOTE — Progress Notes (Signed)
Location:  Other [place].  Nursing Home Room Number: [facility] [address] Place of Service:  SNF [phone]) Abbey Chatters, NP  PCP: Earnestine Mealing, MD  Patient Care Team: Earnestine Mealing, MD as PCP - [facility])  Extended Emergency Contact Information Primary Emergency Contact: [name] [place] of [place] Home Phone: [phone] Relation: Daughter Secondary Emergency Contact: Aurelio Jew Home Phone: [phone] Mobile Phone: [phone] Relation: Son  Goals of care: Advanced Directive information    08/12/2022   10:49 AM  Advanced Directives  Does Patient Have a Medical Advance Directive? Yes  Type of Advance Directive Out of facility DNR (pink MOST or yellow form)  Does patient want to make changes to medical advance directive? No - Patient declined     Chief Complaint  Patient presents with   Medical Management of Chronic Issues    Medical Management of Chronic Issues. Also having weakness and elevated blood pressure.     HPI:  Pt is a 89 y.o. female seen today for medical management of chronic disease.   Nursing reports she was having weakness yesterday after podiatry appt.reports she did not sleep well last night  Reports she eats okay sometimes, weight has been stable.  No nausea or vomiting, diarrhea or constipation. No trouble chewing or swallowing.   Blood pressure also noted to be high  Reports her mood has been okay considering she is where she does not want to be.      Past Medical History:  Diagnosis Date   Actinic keratosis    Arthritis    Basal cell carcinoma    Left chin, right nasolabial   Collagen vascular disease (HCC)    RA   Heartburn    Hypertension    Squamous cell carcinoma of skin 05/06/2021   left hand, treated with [facility]   Past Surgical History:  Procedure Laterality Date   ABDOMINAL HYSTERECTOMY     BREAST CYST EXCISION     HIP ARTHROPLASTY Right 11/30/2019   Procedure: ARTHROPLASTY BIPOLAR  HIP (HEMIARTHROPLASTY);  Surgeon: Christena Flake, MD;  Location: [facility] ORS;  Service: Orthopedics;  Laterality: Right;   SACROPLASTY N/A 12/17/2020   Procedure: SACROPLASTY;  Surgeon: Kennedy Bucker, MD;  Location: [facility] ORS;  Service: Orthopedics;  Laterality: N/A;   THORACIC AORTIC ENDOVASCULAR STENT GRAFT N/A 04/11/2019   Procedure: THORACIC AORTIC ENDOVASCULAR STENT GRAFT insertion;  Surgeon: Nada Libman, MD;  Location: [facility] OR;  Service: Vascular;  Laterality: N/A;   ULTRASOUND GUIDANCE FOR VASCULAR ACCESS Right 04/11/2019   Procedure: Ultrasound Guidance For Vascular Access, right femoral artery;  Surgeon: Nada Libman, MD;  Location: [facility] OR;  Service: Vascular;  Laterality: Right;    Allergies  Allergen Reactions   Amoxicillin Other (See Comments)    Intolerant  Daughter and Son report patient tolerated course of Augmentin in January 2024   Codeine Nausea Only   Ibandronate Sodium    Ibandronate Sodium  [Ibandronic Acid] Other (See Comments)    Gi upset   Risedronate Other (See Comments)    Gi upset   Risedronate Sodium    Sulfa Antibiotics Hives    Outpatient Encounter Medications as of 08/12/2022  Medication Sig   acetaminophen (TYLENOL) 325 MG tablet Take 2 tablets (650 mg total) by mouth every 4 (four) hours as needed for moderate pain.   atorvastatin (LIPITOR) 20 MG tablet Take 20 mg by mouth daily.   Calcium Carbonate (CALCIUM 600 PO) Give one tablet by mouth two times daily.  carvedilol (COREG) 6.25 MG tablet Take 6.25 mg by mouth 2 (two) times daily with a meal.   folic acid (FOLVITE) 1 MG tablet Take 1 mg by mouth daily.   levothyroxine (SYNTHROID, LEVOTHROID) 75 MCG tablet Take 75 mcg by mouth daily.    methotrexate (RHEUMATREX) 2.5 MG tablet Take 15 mg by mouth every Friday.   Multiple Vitamin (MULTIVITAMIN WITH MINERALS) TABS tablet Take 1 tablet by mouth daily.   nitrofurantoin, macrocrystal-monohydrate, (MACROBID) 100 MG capsule Take 1 capsule (100 mg total) by  mouth daily.   predniSONE (DELTASONE) 5 MG tablet Take 2.5 mg by mouth daily.   Propylene Glycol (SYSTANE BALANCE OP) Place 1 drop into both eyes 3 (three) times daily as needed. Instill one drop in both eyes every 12 hours as needed for dry eyes.   senna (SENOKOT) 8.6 MG TABS tablet Take 1 tablet by mouth daily.   sertraline (ZOLOFT) 50 MG tablet Take 50 mg by mouth daily.   triamcinolone cream (KENALOG) 0.1 % Apply 1 Application topically as directed. Apply to trunk, arms and legs 3 days a week Monday, Wednesday, Friday, for atopic dermatitis and stasis dermatitis, avoid face, groin, axilla   No facility-administered encounter medications on file as of 08/12/2022.    Review of Systems  Constitutional:  Positive for fatigue. Negative for activity change, appetite change and unexpected weight change.  HENT:  Negative for congestion and hearing loss.   Eyes: Negative.   Respiratory:  Negative for cough and shortness of breath.   Cardiovascular:  Negative for chest pain, palpitations and leg swelling.  Gastrointestinal:  Negative for abdominal pain, constipation and diarrhea.  Genitourinary:  Negative for difficulty urinating and dysuria.  Musculoskeletal:  Negative for arthralgias and myalgias.  Skin:  Negative for color change and wound.  Neurological:  Negative for dizziness and weakness.  Psychiatric/Behavioral:  Positive for confusion. Negative for agitation and behavioral problems.      Immunization History  Administered Date(s) Administered   Influenza Split 01/21/2014, 12/04/2015   Influenza, High Dose Seasonal PF 01/31/2017, 12/18/2018   Influenza-Unspecified 01/27/2015, 12/18/2018, 01/01/2020, 01/05/2021, 01/19/2022   Moderna Covid-19 Vaccine Bivalent Booster 55yrs & up 09/01/2021   Moderna Sars-Covid-2 Vaccination 04/23/2019, 05/21/2019, 02/19/2020, 07/11/2020, 01/29/2021   Pneumococcal Conjugate-13 03/15/2014   Pneumococcal Polysaccharide-23 04/05/2001, 08/13/2016    RSV,unspecified 01/19/2022   Td 07/04/2005   Td (Adult),5 Lf Tetanus Toxid, Preservative Free 02/15/2017   Tdap 07/27/2005, 02/15/2017   Zoster Recombinat (Shingrix) 12/29/2018, 06/10/2019   Zoster, Live 04/05/2006   Pertinent  Health Maintenance Due  Topic Date Due   DEXA SCAN  Never done   INFLUENZA VACCINE  11/04/2022      12/17/2020    9:00 AM 12/17/2020    2:42 PM 12/18/2020    2:00 AM 12/18/2020    1:00 PM 03/28/2022    6:10 AM  Fall Risk  (RETIRED) Patient Fall Risk Level High fall risk High fall risk High fall risk High fall risk High fall risk   Functional Status Survey:    Vitals:   08/12/22 1039 08/12/22 1126  BP: (!) 169/76 (!) 158/79  Pulse: 63   Resp: 20   Temp: (!) 97.1 F (36.2 C)   SpO2: 95%   Weight: 149 lb 6.4 oz (67.8 kg)   Height: 5\' 7"  (1.702 m)    Body mass index is 23.4 kg/m. Physical Exam Constitutional:      General: She is not in acute distress.    Appearance: She is well-developed. She  is not diaphoretic.  HENT:     Head: Normocephalic and atraumatic.     Mouth/Throat:     Pharynx: No oropharyngeal exudate.  Eyes:     Conjunctiva/sclera: Conjunctivae normal.     Pupils: Pupils are equal, round, and reactive to light.  Cardiovascular:     Rate and Rhythm: Normal rate and regular rhythm.     Heart sounds: Murmur heard.  Pulmonary:     Effort: Pulmonary effort is normal.     Breath sounds: Normal breath sounds.  Abdominal:     General: Bowel sounds are normal.     Palpations: Abdomen is soft.  Musculoskeletal:     Cervical back: Normal range of motion and neck supple.     Right lower leg: No edema.     Left lower leg: No edema.  Skin:    General: Skin is warm and dry.  Neurological:     Mental Status: She is alert. Mental status is at baseline.     Motor: Weakness present.     Gait: Gait abnormal.  Psychiatric:        Mood and Affect: Mood normal.     Labs reviewed: Recent Labs    06/03/22 1818 06/04/22 0416  06/05/22 0505 06/05/22 0509 06/06/22 0405 06/14/22 0000 08/10/22 0000  NA  --  136  --  139 140 140  --   K  --  3.6  --  3.2* 3.2* 3.5  --   CL  --  107  --  109 112* 106  --   CO2  --  23  --  23 21* 26*  --   GLUCOSE  --  102*  --  99 93  --   --   BUN  --  20  --  17 16 9   --   CREATININE  --  0.62  --  0.57 0.49 0.5 0.6  CALCIUM  --  7.9*  --  8.1* 8.0* 7.9*  --   MG 2.1  --  2.4  --   --   --   --    Recent Labs    06/01/22 1238 06/03/22 1117 06/04/22 0416 08/10/22 0000  AST 25 22 17 17   ALT 16 13 10 9   ALKPHOS 60 51 43 77  BILITOT 1.3* 1.5* 1.3*  --   PROT 7.0 6.1* 5.2*  --   ALBUMIN 3.5 2.9* 2.4* 3.2*   Recent Labs    06/01/22 1238 06/03/22 1117 06/03/22 1819 06/04/22 0416 06/05/22 0509 06/06/22 0405 06/14/22 0000 08/10/22 0000  WBC 22.3* 18.2*   < > 16.1* 9.7 8.0 14.2 8.8  NEUTROABS 19.4* 15.1*  --   --   --   --  9,670.00  --   HGB 11.5* 11.4*   < > 10.0* 8.8* 8.7* 9.8* 10.5*  HCT 35.9* 36.0   < > 30.9* 27.0* 27.2* 30* 33*  MCV 98.6 100.6*   < > 97.2 96.8 97.5  --   --   PLT 232 205   < > 203 209 196 308 242   < > = values in this interval not displayed.   Lab Results  Component Value Date   TSH 4.33 06/01/2022   No results found for: "HGBA1C" Lab Results  Component Value Date   CHOL 145 06/01/2022   HDL 58 06/01/2022   LDLCALC 62 06/01/2022   TRIG 125 06/01/2022   CHOLHDL 2.5 04/07/2019    Significant Diagnostic Results in last 30  days:  No results found.  Assessment/Plan 1. Essential hypertension Not controlled,  Will add losartan 25 mg daily and have staff monitor BP daily  2. Recurrent major depressive disorder, in full remission (HCC) Reports mood is stale, continues on zoloft 50 mg daily   3. Malnutrition of moderate degree (HCC) -not eating well, likley contributing to weakness, to add supplement   4. Rheumatoid arthritis, involving unspecified site, unspecified whether rheumatoid factor present (HCC) Followed by  Rheumatology, recent lab work at office.   5. Polymyalgia rheumatica (HCC) Continues on prednisone 2.5 mg daily and follow up with rheumatology  6. Moderate dementia with mood disturbance, unspecified dementia type (HCC) -Stable, no acute changes in cognitive or functional status, continue supportive care with staff and family  7. Leg swelling -controlled with compression hose.   8. Anemia, unspecified type -improving on recent labs, will monitor.    Janene Harvey. Biagio Borg [facility] [phone]

## 2022-08-17 ENCOUNTER — Encounter: Payer: Self-pay | Admitting: Dermatology

## 2022-08-26 LAB — HEMOGLOBIN A1C: Hemoglobin A1C: 5.6

## 2022-08-31 ENCOUNTER — Encounter: Payer: Self-pay | Admitting: Nurse Practitioner

## 2022-08-31 ENCOUNTER — Non-Acute Institutional Stay (SKILLED_NURSING_FACILITY): Payer: Medicare PPO | Admitting: Nurse Practitioner

## 2022-08-31 DIAGNOSIS — F03B3 Unspecified dementia, moderate, with mood disturbance: Secondary | ICD-10-CM | POA: Diagnosis not present

## 2022-08-31 DIAGNOSIS — I10 Essential (primary) hypertension: Secondary | ICD-10-CM

## 2022-08-31 DIAGNOSIS — L219 Seborrheic dermatitis, unspecified: Secondary | ICD-10-CM

## 2022-08-31 DIAGNOSIS — M069 Rheumatoid arthritis, unspecified: Secondary | ICD-10-CM

## 2022-08-31 DIAGNOSIS — L209 Atopic dermatitis, unspecified: Secondary | ICD-10-CM

## 2022-08-31 DIAGNOSIS — I872 Venous insufficiency (chronic) (peripheral): Secondary | ICD-10-CM

## 2022-08-31 MED ORDER — LOSARTAN POTASSIUM 50 MG PO TABS
50.0000 mg | ORAL_TABLET | Freq: Every day | ORAL | Status: DC
Start: 2022-08-31 — End: 2022-10-15

## 2022-08-31 NOTE — Progress Notes (Signed)
Location:  Other [place].  Nursing Home Room Number: [facility] [address] Place of Service:  SNF [phone]) Abbey Chatters, NP  PCP: Earnestine Mealing, MD  Patient Care Team: Earnestine Mealing, MD as PCP - General [facility] Medicine)  Extended Emergency Contact Information Primary Emergency Contact: [name] [place] of [place] Home Phone: [phone] Relation: Daughter Secondary Emergency Contact: Aurelio Jew Home Phone: [phone] Mobile Phone: [phone] Relation: Son  Goals of care: Advanced Directive information    08/31/2022    3:18 PM  Advanced Directives  Does Patient Have a Medical Advance Directive? Yes  Type of Advance Directive Out of facility DNR (pink MOST or yellow form)  Does patient want to make changes to medical advance directive? No - Patient declined     Chief Complaint  Patient presents with   Acute Visit    Worsening Rash.     HPI:  Pt is a 89 y.o. female seen today for an acute visit for Worsening Rash.  Pt being followed by dermatology for atopic dermatitis and currently on triamcinolone cream daily without relief. She continues to itch and has scaly pink papules to trunk, arms and legs.   She also has itching to her head/scalp.  Blood pressures ranging from 137-178/60-70, most elevated over 140    Past Medical History:  Diagnosis Date   Actinic keratosis    Arthritis    Basal cell carcinoma    Left chin, right nasolabial   Collagen vascular disease (HCC)    RA   Heartburn    Hypertension    Squamous cell carcinoma of skin 05/06/2021   left hand, treated with [facility]   Past Surgical History:  Procedure Laterality Date   ABDOMINAL HYSTERECTOMY     BREAST CYST EXCISION     HIP ARTHROPLASTY Right 11/30/2019   Procedure: ARTHROPLASTY BIPOLAR HIP (HEMIARTHROPLASTY);  Surgeon: Christena Flake, MD;  Location: [facility] ORS;  Service: Orthopedics;  Laterality: Right;   SACROPLASTY N/A 12/17/2020   Procedure: SACROPLASTY;  Surgeon: Kennedy Bucker, MD;  Location: [facility] ORS;  Service: Orthopedics;  Laterality: N/A;   THORACIC AORTIC ENDOVASCULAR STENT GRAFT N/A 04/11/2019   Procedure: THORACIC AORTIC ENDOVASCULAR STENT GRAFT insertion;  Surgeon: Nada Libman, MD;  [facility] OR;  Service: Vascular;  Laterality: N/A;   ULTRASOUND GUIDANCE FOR VASCULAR ACCESS Right 04/11/2019   Procedure: Ultrasound Guidance For Vascular Access, right femoral artery;  Surgeon: Nada Libman, MD;  Location: [facility] OR;  Service: Vascular;  Laterality: Right;    Allergies  Allergen Reactions   Amoxicillin Other (See Comments)    Intolerant  Daughter and Son report patient tolerated course of Augmentin in January 2024   Codeine Nausea Only   Ibandronate Sodium    Ibandronate Sodium  [Ibandronic Acid] Other (See Comments)    Gi upset   Risedronate Other (See Comments)    Gi upset   Risedronate Sodium    Sulfa Antibiotics Hives    Outpatient Encounter Medications as of 08/31/2022  Medication Sig   acetaminophen (TYLENOL) 325 MG tablet Take 2 tablets (650 mg total) by mouth every 4 (four) hours as needed for moderate pain.   atorvastatin (LIPITOR) 20 MG tablet Take 20 mg by mouth daily.   Calcium Carbonate (CALCIUM 600 PO) Give one tablet by mouth two times daily.   carvedilol (COREG) 6.25 MG tablet Take 6.25 mg by mouth 2 (two) times daily with a meal.   folic acid (FOLVITE) 1 MG tablet Take 1 mg by mouth  daily.   levothyroxine (SYNTHROID, LEVOTHROID) 75 MCG tablet Take 75 mcg by mouth daily.    losartan (COZAAR) 25 MG tablet Take 25 mg by mouth daily.   methotrexate (RHEUMATREX) 2.5 MG tablet Take 15 mg by mouth every Friday.   Multiple Vitamin (MULTIVITAMIN WITH MINERALS) TABS tablet Take 1 tablet by mouth daily.   nitrofurantoin, macrocrystal-monohydrate, (MACROBID) 100 MG capsule Take 1 capsule (100 mg total) by mouth daily.   predniSONE (DELTASONE) 5 MG tablet Take 2.5 mg by mouth daily.   Propylene Glycol (SYSTANE BALANCE OP) Place 1  drop into both eyes 3 (three) times daily as needed. Instill one drop in both eyes every 12 hours as needed for dry eyes.   senna (SENOKOT) 8.6 MG TABS tablet Take 1 tablet by mouth daily.   sertraline (ZOLOFT) 50 MG tablet Take 50 mg by mouth daily.   triamcinolone cream (KENALOG) 0.1 % Apply 1 Application topically as directed. Apply to trunk, arms and legs 3 days a week Monday, Wednesday, Friday, for atopic dermatitis and stasis dermatitis, avoid face, groin, axilla   No facility-administered encounter medications on file as of 08/31/2022.    Review of Systems  Constitutional:  Negative for activity change, appetite change, fatigue and unexpected weight change.  HENT:  Negative for congestion and hearing loss.   Eyes: Negative.   Respiratory:  Negative for cough and shortness of breath.   Cardiovascular:  Positive for leg swelling. Negative for chest pain and palpitations.  Gastrointestinal:  Negative for abdominal pain, constipation and diarrhea.  Genitourinary:  Negative for difficulty urinating and dysuria.  Musculoskeletal:  Negative for arthralgias and myalgias.  Skin:  Positive for color change and rash. Negative for wound.  Neurological:  Negative for dizziness and weakness.  Psychiatric/Behavioral:  Negative for agitation, behavioral problems and confusion.     Immunization History  Administered Date(s) Administered   Influenza Split 01/21/2014, 12/04/2015   Influenza, High Dose Seasonal PF 01/31/2017, 12/18/2018   Influenza-Unspecified 01/27/2015, 12/18/2018, 01/01/2020, 01/05/2021, 01/19/2022   Moderna Covid-19 Vaccine Bivalent Booster 53yrs & up 09/01/2021   Moderna Sars-Covid-2 Vaccination 04/23/2019, 05/21/2019, 02/19/2020, 07/11/2020, 01/29/2021   Pneumococcal Conjugate-13 03/15/2014   Pneumococcal Polysaccharide-23 04/05/2001, 08/13/2016   RSV,unspecified 01/19/2022   Td 07/04/2005   Td (Adult),5 Lf Tetanus Toxid, Preservative Free 02/15/2017   Tdap 07/27/2005,  02/15/2017   Zoster Recombinat (Shingrix) 12/29/2018, 06/10/2019   Zoster, Live 04/05/2006   Pertinent  Health Maintenance Due  Topic Date Due   DEXA SCAN  Never done   INFLUENZA VACCINE  11/04/2022      12/17/2020    9:00 AM 12/17/2020    2:42 PM 12/18/2020    2:00 AM 12/18/2020    1:00 PM 03/28/2022    6:10 AM  Fall Risk  (RETIRED) Patient Fall Risk Level High fall risk High fall risk High fall risk High fall risk High fall risk   Functional Status Survey:    Vitals:   08/31/22 1510 08/31/22 1520  BP: (!) 153/78 (!) 146/86  Pulse: 60   Resp: 16   Temp: 98.6 F (37 C)   SpO2: 93%   Weight: 149 lb 6.4 oz (67.8 kg)   Height: 5\' 7"  (1.702 m)    Body mass index is 23.4 kg/m. Physical Exam Constitutional:      General: She is not in acute distress.    Appearance: She is well-developed. She is not diaphoretic.  HENT:     Head: Normocephalic and atraumatic.     Mouth/Throat:  Pharynx: No oropharyngeal exudate.  Eyes:     Conjunctiva/sclera: Conjunctivae normal.     Pupils: Pupils are equal, round, and reactive to light.  Cardiovascular:     Rate and Rhythm: Normal rate and regular rhythm.     Heart sounds: Normal heart sounds.  Pulmonary:     Effort: Pulmonary effort is normal.     Breath sounds: Normal breath sounds.  Abdominal:     General: Bowel sounds are normal.     Palpations: Abdomen is soft.  Musculoskeletal:     Cervical back: Normal range of motion and neck supple.     Right lower leg: No edema.     Left lower leg: No edema.  Skin:    General: Skin is warm and dry.  Neurological:     Mental Status: She is alert. Mental status is at baseline.  Psychiatric:        Mood and Affect: Mood normal.     Labs reviewed: Recent Labs    06/03/22 1818 06/04/22 0416 06/05/22 0505 06/05/22 0509 06/06/22 0405 06/14/22 0000 08/10/22 0000  NA  --  136  --  139 140 140  --   K  --  3.6  --  3.2* 3.2* 3.5  --   CL  --  107  --  109 112* 106  --   CO2   --  23  --  23 21* 26*  --   GLUCOSE  --  102*  --  99 93  --   --   BUN  --  20  --  17 16 9   --   CREATININE  --  0.62  --  0.57 0.49 0.5 0.6  CALCIUM  --  7.9*  --  8.1* 8.0* 7.9*  --   MG 2.1  --  2.4  --   --   --   --    Recent Labs    06/01/22 1238 06/03/22 1117 06/04/22 0416 08/10/22 0000  AST 25 22 17 17   ALT 16 13 10 9   ALKPHOS 60 51 43 77  BILITOT 1.3* 1.5* 1.3*  --   PROT 7.0 6.1* 5.2*  --   ALBUMIN 3.5 2.9* 2.4* 3.2*   Recent Labs    06/01/22 1238 06/03/22 1117 06/03/22 1819 06/04/22 0416 06/05/22 0509 06/06/22 0405 06/14/22 0000 08/10/22 0000  WBC 22.3* 18.2*   < > 16.1* 9.7 8.0 14.2 8.8  NEUTROABS 19.4* 15.1*  --   --   --   --  9,670.00  --   HGB 11.5* 11.4*   < > 10.0* 8.8* 8.7* 9.8* 10.5*  HCT 35.9* 36.0   < > 30.9* 27.0* 27.2* 30* 33*  MCV 98.6 100.6*   < > 97.2 96.8 97.5  --   --   PLT 232 205   < > 203 209 196 308 242   < > = values in this interval not displayed.   Lab Results  Component Value Date   TSH 4.33 06/01/2022   No results found for: "HGBA1C" Lab Results  Component Value Date   CHOL 145 06/01/2022   HDL 58 06/01/2022   LDLCALC 62 06/01/2022   TRIG 125 06/01/2022   CHOLHDL 2.5 04/07/2019    Significant Diagnostic Results in last 30 days:  No results found.  Assessment/Plan 1. Atopic dermatitis, unspecified type Continues to have itching to arms, legs, trunk and head -will give prednisone taper at this time -continue with cerave daily and to  apply immediately after bathing.  2. Essential hypertension -not controlled at this time, will increase losartan to 50 mg daily and continue to monitor.  - losartan (COZAAR) 50 MG tablet; Take 1 tablet (50 mg total) by mouth daily.  3. Rheumatoid arthritis, involving unspecified site, unspecified whether rheumatoid factor present (HCC) Has been controlled on regimen currently on, will continue predisone 2.5 mg after taper  4. Moderate dementia with mood disturbance, unspecified  dementia type (HCC) -Stable, no acute changes in cognitive or functional status, continue supportive care with staff and family  5. Seborrheic dermatitis Ketoconazole 2% shampoo twice weekly for 8 weeks.   6. Edema of both lower extremities due to peripheral venous insufficiency -encouraged to elevate legs above level of heart as tolerates, low sodium diet, compression hose as tolerates (on in am, off in pm)  Clay Menser K. Biagio Borg [facility] [phone]

## 2022-09-13 LAB — BASIC METABOLIC PANEL
BUN: 11 (ref 4–21)
CO2: 27 — AB (ref 13–22)
Chloride: 107 (ref 99–108)
Creatinine: 0.5 (ref 0.5–1.1)
Glucose: 75
Potassium: 3.3 mEq/L — AB (ref 3.5–5.1)
Sodium: 140 (ref 137–147)

## 2022-09-13 LAB — COMPREHENSIVE METABOLIC PANEL
Calcium: 7.9 — AB (ref 8.7–10.7)
eGFR: 86

## 2022-09-23 ENCOUNTER — Ambulatory Visit: Payer: Medicare PPO | Admitting: Dermatology

## 2022-09-28 ENCOUNTER — Non-Acute Institutional Stay (SKILLED_NURSING_FACILITY): Payer: Medicare PPO | Admitting: Nurse Practitioner

## 2022-09-28 ENCOUNTER — Encounter: Payer: Self-pay | Admitting: Nurse Practitioner

## 2022-09-28 DIAGNOSIS — F3342 Major depressive disorder, recurrent, in full remission: Secondary | ICD-10-CM

## 2022-09-28 DIAGNOSIS — I872 Venous insufficiency (chronic) (peripheral): Secondary | ICD-10-CM

## 2022-09-28 DIAGNOSIS — F03B3 Unspecified dementia, moderate, with mood disturbance: Secondary | ICD-10-CM

## 2022-09-28 DIAGNOSIS — E440 Moderate protein-calorie malnutrition: Secondary | ICD-10-CM

## 2022-09-28 DIAGNOSIS — M069 Rheumatoid arthritis, unspecified: Secondary | ICD-10-CM

## 2022-09-28 DIAGNOSIS — E782 Mixed hyperlipidemia: Secondary | ICD-10-CM

## 2022-09-28 DIAGNOSIS — D649 Anemia, unspecified: Secondary | ICD-10-CM

## 2022-09-28 DIAGNOSIS — I10 Essential (primary) hypertension: Secondary | ICD-10-CM

## 2022-09-28 NOTE — Progress Notes (Signed)
Location:  Other [place].  Nursing Home Room Number: [facility] [address] Place of Service:  SNF [phone]) Gloria Chatters, NP  PCP: Earnestine Mealing, MD  Patient Care Team: Earnestine Mealing, MD as PCP - General [facility] Medicine)  Extended Emergency Contact Information Primary Emergency Contact: [name] [place] of [place] Home Phone: [phone] Relation: Daughter Secondary Emergency Contact: Aurelio Jew Home Phone: [phone] Mobile Phone: [phone] Relation: Son  Goals of care: Advanced Directive information    09/28/2022   11:17 AM  Advanced Directives  Does Patient Have a Medical Advance Directive? Yes  Type of Advance Directive Out of facility DNR (pink MOST or yellow form)  Does patient want to make changes to medical advance directive? No - Patient declined     Chief Complaint  Patient presents with   Medical Management of Chronic Issues    Medical Management of Chronic Issues.     HPI:  Pt is a 89 y.o. female seen today for medical management of chronic disease.  Pt with hx of dementia, htn, RA and others.  Last month was having issues with itching. This has improved significantly after prednisone course.   Blood pressure with improved controlled on losartan 50 mg daily, continues on coreg BID     Past Medical History:  Diagnosis Date   Actinic keratosis    Arthritis    Basal cell carcinoma    Left chin, right nasolabial   Collagen vascular disease (HCC)    RA   Heartburn    Hypertension    Squamous cell carcinoma of skin 05/06/2021   left hand, treated with [facility]   Past Surgical History:  Procedure Laterality Date   ABDOMINAL HYSTERECTOMY     BREAST CYST EXCISION     HIP ARTHROPLASTY Right 11/30/2019   Procedure: ARTHROPLASTY BIPOLAR HIP (HEMIARTHROPLASTY);  Surgeon: Christena Flake, MD;  Location: [facility] ORS;  Service: Orthopedics;  Laterality: Right;   SACROPLASTY N/A 12/17/2020   Procedure: SACROPLASTY;  Surgeon: Kennedy Bucker,  MD;  Location: [facility] ORS;  Service: Orthopedics;  Laterality: N/A;   THORACIC AORTIC ENDOVASCULAR STENT GRAFT N/A 04/11/2019   Procedure: THORACIC AORTIC ENDOVASCULAR STENT GRAFT insertion;  Surgeon: Nada Libman, MD;  Location: [facility] OR;  Service: Vascular;  Laterality: N/A;   ULTRASOUND GUIDANCE FOR VASCULAR ACCESS Right 04/11/2019   Procedure: Ultrasound Guidance For Vascular Access, right femoral artery;  Surgeon: Nada Libman, MD;  Location: [facility] OR;  Service: Vascular;  Laterality: Right;    Allergies  Allergen Reactions   Amoxicillin Other (See Comments)    Intolerant  Daughter and Son report patient tolerated course of Augmentin in January 2024   Codeine Nausea Only   Ibandronate Sodium    Ibandronate Sodium  [Ibandronic Acid] Other (See Comments)    Gi upset   Risedronate Other (See Comments)    Gi upset   Risedronate Sodium    Sulfa Antibiotics Hives    Outpatient Encounter Medications as of 09/28/2022  Medication Sig   acetaminophen (TYLENOL) 325 MG tablet Take 2 tablets (650 mg total) by mouth every 4 (four) hours as needed for moderate pain.   atorvastatin (LIPITOR) 20 MG tablet Take 20 mg by mouth daily.   Calcium Carbonate (CALCIUM 600 PO) Give one tablet by mouth two times daily.   carvedilol (COREG) 6.25 MG tablet Take 6.25 mg by mouth 2 (two) times daily with a meal.   folic acid (FOLVITE) 1 MG tablet Take 1 mg by mouth daily.   ketoconazole (  NIZORAL) 2 % shampoo Apply 1 Application topically 2 (two) times a week. Wednesday and Saturday.   levothyroxine (SYNTHROID, LEVOTHROID) 75 MCG tablet Take 75 mcg by mouth daily.    losartan (COZAAR) 50 MG tablet Take 1 tablet (50 mg total) by mouth daily.   methotrexate (RHEUMATREX) 2.5 MG tablet Take 15 mg by mouth every Friday.   Multiple Vitamin (MULTIVITAMIN WITH MINERALS) TABS tablet Take 1 tablet by mouth daily.   nitrofurantoin, macrocrystal-monohydrate, (MACROBID) 100 MG capsule Take 1 capsule (100 mg total) by mouth  daily.   predniSONE (DELTASONE) 5 MG tablet Take 2.5 mg by mouth daily.   Propylene Glycol (SYSTANE BALANCE OP) Place 1 drop into both eyes 3 (three) times daily as needed. Instill one drop in both eyes every 12 hours as needed for dry eyes.   senna (SENOKOT) 8.6 MG TABS tablet Take 1 tablet by mouth daily.   sertraline (ZOLOFT) 50 MG tablet Take 50 mg by mouth daily.   triamcinolone cream (KENALOG) 0.1 % Apply 1 Application topically as directed. Apply to trunk, arms and legs 3 days a week Monday, Wednesday, Friday, for atopic dermatitis and stasis dermatitis, avoid face, groin, axilla   No facility-administered encounter medications on file as of 09/28/2022.    Review of Systems  Constitutional:  Negative for activity change, appetite change, fatigue and unexpected weight change.  HENT:  Negative for congestion and hearing loss.   Eyes: Negative.   Respiratory:  Negative for cough and shortness of breath.   Cardiovascular:  Negative for chest pain, palpitations and leg swelling.  Gastrointestinal:  Negative for abdominal pain, constipation and diarrhea.  Genitourinary:  Negative for difficulty urinating and dysuria.  Musculoskeletal:  Negative for arthralgias and myalgias.  Skin:  Negative for color change and wound.  Neurological:  Negative for dizziness and weakness.  Psychiatric/Behavioral:  Negative for agitation, behavioral problems and confusion.      Immunization History  Administered Date(s) Administered   Influenza Split 01/21/2014, 12/04/2015   Influenza, High Dose Seasonal PF 01/31/2017, 12/18/2018   Influenza-Unspecified 01/27/2015, 12/18/2018, 01/01/2020, 01/05/2021, 01/19/2022   Moderna Covid-19 Vaccine Bivalent Booster 35yrs & up 09/01/2021   Moderna Sars-Covid-2 Vaccination 04/23/2019, 05/21/2019, 02/19/2020, 07/11/2020, 01/29/2021   Pneumococcal Conjugate-13 03/15/2014   Pneumococcal Polysaccharide-23 04/05/2001, 08/13/2016   RSV,unspecified 01/19/2022   Td  07/04/2005   Td (Adult),5 Lf Tetanus Toxid, Preservative Free 02/15/2017   Tdap 07/27/2005, 02/15/2017   Zoster Recombinat (Shingrix) 12/29/2018, 06/10/2019   Zoster, Live 04/05/2006   Pertinent  Health Maintenance Due  Topic Date Due   DEXA SCAN  Never done   INFLUENZA VACCINE  11/04/2022      12/17/2020    9:00 AM 12/17/2020    2:42 PM 12/18/2020    2:00 AM 12/18/2020    1:00 PM 03/28/2022    6:10 AM  Fall Risk  (RETIRED) Patient Fall Risk Level High fall risk High fall risk High fall risk High fall risk High fall risk   Functional Status Survey:    Vitals:   09/28/22 1034  BP: 136/70  Pulse: 60  Resp: 16  Temp: 98.6 F (37 C)  SpO2: 93%  Weight: 141 lb 12.8 oz (64.3 kg)  Height: 5\' 7"  (1.702 m)   Body mass index is 22.21 kg/m. Filed Weights   09/28/22 1034  Weight: 141 lb 12.8 oz (64.3 kg)    Physical Exam Constitutional:      General: She is not in acute distress.    Appearance: She is  well-developed. She is not diaphoretic.  HENT:     Head: Normocephalic and atraumatic.     Mouth/Throat:     Pharynx: No oropharyngeal exudate.  Eyes:     Conjunctiva/sclera: Conjunctivae normal.     Pupils: Pupils are equal, round, and reactive to light.  Cardiovascular:     Rate and Rhythm: Normal rate and regular rhythm.     Heart sounds: Normal heart sounds.  Pulmonary:     Effort: Pulmonary effort is normal.     Breath sounds: Normal breath sounds.  Abdominal:     General: Bowel sounds are normal.     Palpations: Abdomen is soft.  Musculoskeletal:     Cervical back: Normal range of motion and neck supple.     Right lower leg: No edema.     Left lower leg: No edema.  Skin:    General: Skin is warm and dry.  Neurological:     Mental Status: She is alert. Mental status is at baseline.     Gait: Gait abnormal.  Psychiatric:        Mood and Affect: Mood normal.     Labs reviewed: Recent Labs    06/03/22 1818 06/04/22 0416 06/05/22 0505 06/05/22 0509  06/06/22 0405 06/14/22 0000 08/10/22 0000 09/13/22 0000  NA  --  136  --  139 140 140  --  140  K  --  3.6  --  3.2* 3.2* 3.5  --  3.3*  CL  --  107  --  109 112* 106  --  107  CO2  --  23  --  23 21* 26*  --  27*  GLUCOSE  --  102*  --  99 93  --   --   --   BUN  --  20  --  17 16 9   --  11  CREATININE  --  0.62  --  0.57 0.49 0.5 0.6 0.5  CALCIUM  --  7.9*  --  8.1* 8.0* 7.9*  --  7.9*  MG 2.1  --  2.4  --   --   --   --   --    Recent Labs    06/01/22 1238 06/03/22 1117 06/04/22 0416 08/10/22 0000  AST 25 22 17 17   ALT 16 13 10 9   ALKPHOS 60 51 43 77  BILITOT 1.3* 1.5* 1.3*  --   PROT 7.0 6.1* 5.2*  --   ALBUMIN 3.5 2.9* 2.4* 3.2*   Recent Labs    06/01/22 1238 06/03/22 1117 06/03/22 1819 06/04/22 0416 06/05/22 0509 06/06/22 0405 06/14/22 0000 08/10/22 0000  WBC 22.3* 18.2*   < > 16.1* 9.7 8.0 14.2 8.8  NEUTROABS 19.4* 15.1*  --   --   --   --  9,670.00  --   HGB 11.5* 11.4*   < > 10.0* 8.8* 8.7* 9.8* 10.5*  HCT 35.9* 36.0   < > 30.9* 27.0* 27.2* 30* 33*  MCV 98.6 100.6*   < > 97.2 96.8 97.5  --   --   PLT 232 205   < > 203 209 196 308 242   < > = values in this interval not displayed.   Lab Results  Component Value Date   TSH 4.33 06/01/2022   Lab Results  Component Value Date   HGBA1C 5.6 08/26/2022   Lab Results  Component Value Date   CHOL 145 06/01/2022   HDL 58 06/01/2022   LDLCALC 62 06/01/2022  TRIG 125 06/01/2022   CHOLHDL 2.5 04/07/2019    Significant Diagnostic Results in last 30 days:  No results found.  Assessment/Plan 1. Essential hypertension Better control on current regimen, will continue to monitor.   2. Rheumatoid arthritis, involving unspecified site, unspecified whether rheumatoid factor present (HCC) Stable, continues on methotrexate with prednisone 2.5 mg daily which controls pain.   3. Moderate dementia with mood disturbance, unspecified dementia type (HCC) -Stable, no acute changes in cognitive or functional  status, continue supportive care.   4. Edema of both lower extremities due to peripheral venous insufficiency -well controlled at this time.  -to continue to elevate legs above level of heart as tolerates, low sodium diet, compression hose as tolerates (on in am, off in pm)  5. Recurrent major depressive disorder, in full remission (HCC) Stalbe on zoloft 50 mg daily  6. Anemia, unspecified type Improving on rcent labs, will continue to monitor  7. Mixed hyperlipidemia LDL at goal on lipitor 20 mg dialy   8. Malnutrition of moderate degree (HCC) Wt stable, encouraged to liberalize diet. To have protein supplement in addition to smallest meal of the day. Staff continues to monitor.     Janene Harvey. Biagio Borg [facility] [phone]

## 2022-10-09 ENCOUNTER — Emergency Department
Admission: EM | Admit: 2022-10-09 | Discharge: 2022-10-09 | Disposition: A | Discharge status: Other Acute Inpatient Hospital | Payer: Medicare PPO | Source: Home / Self Care | Attending: Student in an Organized Health Care Education/Training Program | Admitting: Student in an Organized Health Care Education/Training Program

## 2022-10-09 ENCOUNTER — Encounter (HOSPITAL_COMMUNITY): Payer: Self-pay

## 2022-10-09 ENCOUNTER — Other Ambulatory Visit: Payer: Self-pay

## 2022-10-09 ENCOUNTER — Emergency Department: Payer: Medicare PPO

## 2022-10-09 DIAGNOSIS — R079 Chest pain, unspecified: Secondary | ICD-10-CM | POA: Insufficient documentation

## 2022-10-09 DIAGNOSIS — M546 Pain in thoracic spine: Secondary | ICD-10-CM | POA: Diagnosis present

## 2022-10-09 DIAGNOSIS — I71012 Dissection of descending thoracic aorta: Secondary | ICD-10-CM | POA: Diagnosis not present

## 2022-10-09 DIAGNOSIS — F0390 Unspecified dementia without behavioral disturbance: Secondary | ICD-10-CM | POA: Diagnosis not present

## 2022-10-09 LAB — BASIC METABOLIC PANEL
Anion gap: 9 (ref 5–15)
BUN: 13 mg/dL (ref 8–23)
CO2: 24 mmol/L (ref 22–32)
Calcium: 8.6 mg/dL — ABNORMAL LOW (ref 8.9–10.3)
Chloride: 102 mmol/L (ref 98–111)
Creatinine, Ser: 0.66 mg/dL (ref 0.44–1.00)
GFR, Estimated: >60 mL/min (ref >60)
Glucose, Bld: 115 mg/dL — ABNORMAL HIGH (ref 70–99)
Potassium: 3.5 mmol/L (ref 3.5–5.1)
Sodium: 135 mmol/L (ref 135–145)

## 2022-10-09 LAB — CBC
HCT: 34.8 % — ABNORMAL LOW (ref 36.0–46.0)
Hemoglobin: 11.1 g/dL — ABNORMAL LOW (ref 12.0–15.0)
MCH: 28.9 pg (ref 26.0–34.0)
MCHC: 31.9 g/dL (ref 30.0–36.0)
MCV: 90.6 fL (ref 80.0–100.0)
Platelets: 226 10*3/uL (ref 150–400)
RBC: 3.84 MIL/uL — ABNORMAL LOW (ref 3.87–5.11)
RDW: 17.3 % — ABNORMAL HIGH (ref 11.5–15.5)
WBC: 9.2 10*3/uL (ref 4.0–10.5)
nRBC: 0 % (ref 0.0–0.2)

## 2022-10-09 LAB — TROPONIN I (HIGH SENSITIVITY)
Troponin I (High Sensitivity): 13 ng/L (ref <18)
Troponin I (High Sensitivity): 62 ng/L — ABNORMAL HIGH (ref <18)

## 2022-10-09 MED ORDER — ALUM & MAG HYDROXIDE-SIMETH 200-200-20 MG/5ML PO SUSP
30.0000 mL | Freq: Once | ORAL | Status: AC
  Administered 2022-10-09: 30 mL via ORAL
  Filled 2022-10-09: qty 30

## 2022-10-09 MED ORDER — AMLODIPINE BESYLATE 5 MG PO TABS
5.0000 mg | ORAL_TABLET | Freq: Once | ORAL | Status: AC
  Administered 2022-10-09: 5 mg via ORAL

## 2022-10-09 MED ORDER — IOHEXOL 350 MG/ML SOLN
75.0000 mL | Freq: Once | INTRAVENOUS | Status: AC | PRN
  Administered 2022-10-09: 75 mL via INTRAVENOUS

## 2022-10-09 MED ORDER — FENTANYL CITRATE PF 50 MCG/ML IJ SOSY
25.0000 ug | PREFILLED_SYRINGE | INTRAMUSCULAR | Status: DC | PRN
  Administered 2022-10-09 (×3): 25 ug via INTRAVENOUS
  Filled 2022-10-09 (×4): qty 1

## 2022-10-09 MED ORDER — CLEVIDIPINE BUTYRATE 0.5 MG/ML IV EMUL
0.0000 mg/h | INTRAVENOUS | Status: DC
  Administered 2022-10-09 (×2): 2 mg/h via INTRAVENOUS
  Filled 2022-10-09: qty 50

## 2022-10-09 MED ORDER — LABETALOL HCL 5 MG/ML IV SOLN
5.0000 mg | Freq: Once | INTRAVENOUS | Status: AC
  Administered 2022-10-09: 5 mg via INTRAVENOUS
  Filled 2022-10-09: qty 4

## 2022-10-09 NOTE — ED Triage Notes (Signed)
Pt in via [facility] c/o back pain and originally CP with EMS, but denies CP now. Pt c/o sharp upper back pain currently. Hx of Thoracic Aortic Aneurysm.Hx of dementia at baseline. EMS gave x4 81mg  of Aspirin.  EMS Vitals: 236/118- bp HR- 60's  96% on RA

## 2022-10-09 NOTE — ED Notes (Signed)
Dr. Roxan Hockey aware of pt's lowered BP and that the Cleviprex is now off

## 2022-10-09 NOTE — ED Notes (Signed)
pt accepted to 2H01C-01 per K[facility]. Call report to [phone]. [facility] to transport

## 2022-10-09 NOTE — ED Provider Notes (Signed)
[facility] Provider Note    Event Date/Time   First MD Initiated Contact with Patient 10/09/22 1850     (approximate)   History   Back Pain   HPI  Gloria [name] is a 89 y.o. female extensive past medical history including collagen vascular disorder with a history of ascending thoracic aneurysm as well as dementia presents to the ER with sudden onset chest pain and back pain that occurred after she was eating this evening.  Denies any headache.  No neck pain.  No radiation to the shoulder or arms.  Denies any abdominal pain.     Physical Exam   Triage Vital Signs: ED Triage Vitals  Enc Vitals Group     BP 10/09/22 1847 (!) 229/113     Pulse Rate 10/09/22 1847 65     Resp 10/09/22 1847 13     Temp 10/09/22 1849 97.8 F (36.6 C)     Temp Source 10/09/22 1849 Oral     SpO2 10/09/22 1847 100 %     Weight --      Height --      Head Circumference --      Peak Flow --      Pain Score 10/09/22 1844 7     Pain Loc --      Pain Edu? --      Excl. in GC? --     Most recent vital signs: Vitals:   10/09/22 2215 10/09/22 2230  BP: (!) 183/78 (!) 171/73  Pulse: 65 66  Resp: 18 (!) 23  Temp:    SpO2: 94% 95%     Constitutional: Alert  Eyes: Conjunctivae are normal.  Head: Atraumatic. Nose: No congestion/rhinnorhea. Mouth/Throat: Mucous membranes are moist.   Neck: Painless ROM.  Cardiovascular:   Good peripheral circulation. Respiratory: Normal respiratory effort.  No retractions.  Gastrointestinal: Soft and nontender.  Musculoskeletal:  no deformity Neurologic:  MAE spontaneously. No gross focal neurologic deficits are appreciated.  Skin:  Skin is warm, dry and intact. No rash noted. Psychiatric: Mood and affect are normal. Speech and behavior are normal.    ED Results / Procedures / Treatments   Labs (all labs ordered are listed, but only abnormal results are displayed) Labs Reviewed  BASIC METABOLIC PANEL - Abnormal; Notable  for the following components:      Result Value   Glucose, Bld 115 (*)    Calcium 8.6 (*)    All other components within normal limits  CBC - Abnormal; Notable for the following components:   RBC 3.84 (*)    Hemoglobin 11.1 (*)    HCT 34.8 (*)    RDW 17.3 (*)    All other components within normal limits  TROPONIN I (HIGH SENSITIVITY)  TROPONIN I (HIGH SENSITIVITY)     EKG  ED ECG REPORT I, Willy Eddy, the attending physician, personally viewed and interpreted this ECG.   Date: 10/09/2022  EKG Time: 18:46  Rate: 70  Rhythm: sinus  Axis: left  Intervals: lbbb  ST&T Change: no stemi    RADIOLOGY Please see ED Course for my review and interpretation.  I personally reviewed all radiographic images ordered to evaluate for the above acute complaints and reviewed radiology reports and findings.  These findings were personally discussed with the patient.  Please see medical record for radiology report.    PROCEDURES:  Critical Care performed: Yes, see critical care procedure note(s)  .Critical Care  Performed by: Willy Eddy,  MD Authorized by: Willy Eddy, MD   Critical care provider statement:    Critical care time (minutes):  40   Critical care was necessary to treat or prevent imminent or life-threatening deterioration of the following conditions:  Circulatory failure   Critical care was time spent personally by me on the following activities:  Ordering and performing treatments and interventions, ordering and review of laboratory studies, ordering and review of radiographic studies, pulse oximetry, re-evaluation of patient's condition, review of old charts, obtaining history from patient or surrogate, examination of patient, evaluation of patient's response to treatment, discussions with primary provider, discussions with consultants and development of treatment plan with patient or surrogate    MEDICATIONS ORDERED IN ED: Medications  fentaNYL  (SUBLIMAZE) injection 25 mcg (25 mcg Intravenous Given 10/09/22 2148)  clevidipine (CLEVIPREX) infusion 0.5 mg/mL (4 mg/hr Intravenous Rate/Dose Change 10/09/22 2228)  alum & mag hydroxide-simeth (MAALOX/MYLANTA) 200-200-20 MG/5ML suspension 30 mL (30 mLs Oral Given 10/09/22 1918)  amLODipine (NORVASC) tablet 5 mg (5 mg Oral Given 10/09/22 1917)  iohexol (OMNIPAQUE) 350 MG/ML injection 75 mL (75 mLs Intravenous Contrast Given 10/09/22 1946)  labetalol (NORMODYNE) injection 5 mg (5 mg Intravenous Given 10/09/22 2018)     IMPRESSION / MDM / ASSESSMENT AND PLAN / ED COURSE  I reviewed the triage vital signs and the nursing notes.                              Differential diagnosis includes, but is not limited to, ACS, pericarditis, esophagitis, boerhaaves, pe, dissection, pna, bronchitis, costochondritis  Patient presenting to the ER for evaluation of symptoms as described above.  Based on symptoms, risk factors and considered above differential, this presenting complaint could reflect a potentially life-threatening illness therefore the patient will be placed on continuous pulse oximetry and telemetry for monitoring.  Laboratory evaluation will be sent to evaluate for the above complaints.  EKG nonischemic.  Based on her hypertension history of aneurysm dissection I am concerned for dissection will order CTA.  Give pain medication.  Will treat blood pressure.   Clinical Course as of 10/09/22 2247  Sat Oct 09, 2022  2003 Initial troponin -.  No leukocytosis. [PR]  2012 CTA on my review and interpretation with no evidence of dissection await formal radiology report. [PR]  2108 Discussed case in consultation with Dr. Jeannine Kitten of vascular surgery who is reviewed images and does recommend medical management at this time.  Patient is from [place] and he typically only covers in [place] therefore will transfer to [facility].  Have initiated Cleviprex.  Her pain is currently controlled.  Her repeat exam is  reassuring with good distal perfusion abdominal exam is benign.  Discussed case in consultation with CCM at [facility] has, accepted patient to their service. [PR]    Clinical Course User Index [PR] Willy Eddy, MD     FINAL CLINICAL IMPRESSION(S) / ED DIAGNOSES   Final diagnoses:  Dissection of descending thoracic aorta (HCC)     Rx / DC Orders   ED Discharge Orders     None        Note:  This document was prepared using Dragon voice recognition software and may include unintentional dictation errors.    Willy Eddy, MD 10/09/22 [phone]

## 2022-10-09 NOTE — ED Notes (Signed)
[facility] here to assume care of pt.

## 2022-10-10 ENCOUNTER — Inpatient Hospital Stay (HOSPITAL_COMMUNITY)
Admission: EM | Admit: 2022-10-10 | Discharge: 2022-10-15 | DRG: 304 | Disposition: A | Discharge status: Skilled Nursing Facility | Payer: Medicare PPO | Source: Other Acute Inpatient Hospital | Attending: Internal Medicine | Admitting: Internal Medicine

## 2022-10-10 DIAGNOSIS — F32A Depression, unspecified: Secondary | ICD-10-CM | POA: Diagnosis present

## 2022-10-10 DIAGNOSIS — Z79899 Other long term (current) drug therapy: Secondary | ICD-10-CM | POA: Diagnosis not present

## 2022-10-10 DIAGNOSIS — Z881 Allergy status to other antibiotic agents status: Secondary | ICD-10-CM

## 2022-10-10 DIAGNOSIS — Z95828 Presence of other vascular implants and grafts: Secondary | ICD-10-CM

## 2022-10-10 DIAGNOSIS — E039 Hypothyroidism, unspecified: Secondary | ICD-10-CM | POA: Diagnosis present

## 2022-10-10 DIAGNOSIS — Z96641 Presence of right artificial hip joint: Secondary | ICD-10-CM | POA: Diagnosis present

## 2022-10-10 DIAGNOSIS — M069 Rheumatoid arthritis, unspecified: Secondary | ICD-10-CM | POA: Diagnosis present

## 2022-10-10 DIAGNOSIS — I71012 Dissection of descending thoracic aorta: Secondary | ICD-10-CM

## 2022-10-10 DIAGNOSIS — Z885 Allergy status to narcotic agent status: Secondary | ICD-10-CM | POA: Diagnosis not present

## 2022-10-10 DIAGNOSIS — Z85828 Personal history of other malignant neoplasm of skin: Secondary | ICD-10-CM

## 2022-10-10 DIAGNOSIS — Z79631 Long term (current) use of antimetabolite agent: Secondary | ICD-10-CM | POA: Diagnosis not present

## 2022-10-10 DIAGNOSIS — Z66 Do not resuscitate: Secondary | ICD-10-CM | POA: Diagnosis present

## 2022-10-10 DIAGNOSIS — I161 Hypertensive emergency: Principal | ICD-10-CM | POA: Diagnosis present

## 2022-10-10 DIAGNOSIS — J90 Pleural effusion, not elsewhere classified: Secondary | ICD-10-CM | POA: Diagnosis present

## 2022-10-10 DIAGNOSIS — Z79890 Hormone replacement therapy (postmenopausal): Secondary | ICD-10-CM

## 2022-10-10 DIAGNOSIS — Z90710 Acquired absence of both cervix and uterus: Secondary | ICD-10-CM

## 2022-10-10 DIAGNOSIS — Z882 Allergy status to sulfonamides status: Secondary | ICD-10-CM

## 2022-10-10 DIAGNOSIS — D84821 Immunodeficiency due to drugs: Secondary | ICD-10-CM | POA: Diagnosis present

## 2022-10-10 DIAGNOSIS — F0390 Unspecified dementia without behavioral disturbance: Secondary | ICD-10-CM | POA: Diagnosis present

## 2022-10-10 DIAGNOSIS — E871 Hypo-osmolality and hyponatremia: Secondary | ICD-10-CM | POA: Diagnosis present

## 2022-10-10 DIAGNOSIS — Z7952 Long term (current) use of systemic steroids: Secondary | ICD-10-CM

## 2022-10-10 DIAGNOSIS — I10 Essential (primary) hypertension: Secondary | ICD-10-CM | POA: Diagnosis present

## 2022-10-10 DIAGNOSIS — J9811 Atelectasis: Secondary | ICD-10-CM | POA: Diagnosis present

## 2022-10-10 DIAGNOSIS — E785 Hyperlipidemia, unspecified: Secondary | ICD-10-CM | POA: Diagnosis present

## 2022-10-10 DIAGNOSIS — E876 Hypokalemia: Secondary | ICD-10-CM | POA: Insufficient documentation

## 2022-10-10 DIAGNOSIS — I158 Other secondary hypertension: Secondary | ICD-10-CM | POA: Diagnosis not present

## 2022-10-10 DIAGNOSIS — I7121 Aneurysm of the ascending aorta, without rupture: Secondary | ICD-10-CM | POA: Diagnosis present

## 2022-10-10 DIAGNOSIS — Z888 Allergy status to other drugs, medicaments and biological substances status: Secondary | ICD-10-CM | POA: Diagnosis not present

## 2022-10-10 HISTORY — DX: Dissection of descending thoracic aorta: I71.012

## 2022-10-10 LAB — CBC
HCT: 32.2 % — ABNORMAL LOW (ref 36.0–46.0)
Hemoglobin: 10.6 g/dL — ABNORMAL LOW (ref 12.0–15.0)
MCH: 28.9 pg (ref 26.0–34.0)
MCHC: 32.9 g/dL (ref 30.0–36.0)
MCV: 87.7 fL (ref 80.0–100.0)
Platelets: 201 10*3/uL (ref 150–400)
RBC: 3.67 MIL/uL — ABNORMAL LOW (ref 3.87–5.11)
RDW: 17.4 % — ABNORMAL HIGH (ref 11.5–15.5)
WBC: 13.4 10*3/uL — ABNORMAL HIGH (ref 4.0–10.5)
nRBC: 0 % (ref 0.0–0.2)

## 2022-10-10 LAB — BASIC METABOLIC PANEL
Anion gap: 9 (ref 5–15)
BUN: 10 mg/dL (ref 8–23)
CO2: 25 mmol/L (ref 22–32)
Calcium: 8.2 mg/dL — ABNORMAL LOW (ref 8.9–10.3)
Chloride: 100 mmol/L (ref 98–111)
Creatinine, Ser: 0.63 mg/dL (ref 0.44–1.00)
GFR, Estimated: >60 mL/min (ref >60)
Glucose, Bld: 119 mg/dL — ABNORMAL HIGH (ref 70–99)
Potassium: 3 mmol/L — ABNORMAL LOW (ref 3.5–5.1)
Sodium: 134 mmol/L — ABNORMAL LOW (ref 135–145)

## 2022-10-10 LAB — MAGNESIUM: Magnesium: 2 mg/dL (ref 1.7–2.4)

## 2022-10-10 LAB — MRSA NEXT GEN BY PCR, NASAL: MRSA by PCR Next Gen: NOT DETECTED

## 2022-10-10 LAB — TROPONIN I (HIGH SENSITIVITY): Troponin I (High Sensitivity): 48 ng/L — ABNORMAL HIGH (ref <18)

## 2022-10-10 MED ORDER — CLEVIDIPINE BUTYRATE 0.5 MG/ML IV EMUL
INTRAVENOUS | Status: AC
  Administered 2022-10-10: 8 mg
  Filled 2022-10-10: qty 100

## 2022-10-10 MED ORDER — POTASSIUM CHLORIDE 20 MEQ PO PACK
60.0000 meq | PACK | Freq: Once | ORAL | Status: AC
  Administered 2022-10-10: 60 meq via ORAL
  Filled 2022-10-10: qty 3

## 2022-10-10 MED ORDER — CLEVIDIPINE BUTYRATE 0.5 MG/ML IV EMUL
0.0000 mg/h | INTRAVENOUS | Status: DC
  Administered 2022-10-10 (×2): 8 mg/h via INTRAVENOUS
  Administered 2022-10-10: 7 mg/h via INTRAVENOUS
  Administered 2022-10-11: 14 mg/h via INTRAVENOUS
  Administered 2022-10-11: 11 mg/h via INTRAVENOUS
  Administered 2022-10-11: 20 mg/h via INTRAVENOUS
  Administered 2022-10-11: 17 mg/h via INTRAVENOUS
  Filled 2022-10-10 (×7): qty 100

## 2022-10-10 MED ORDER — POTASSIUM CHLORIDE CRYS ER 20 MEQ PO TBCR: 60.0000 meq | EXTENDED_RELEASE_TABLET | Freq: Once | ORAL | Status: DC

## 2022-10-10 MED ORDER — CARVEDILOL 6.25 MG PO TABS
6.2500 mg | ORAL_TABLET | Freq: Two times a day (BID) | ORAL | Status: DC
  Administered 2022-10-10 (×2): 6.25 mg via ORAL
  Filled 2022-10-10 (×2): qty 1

## 2022-10-10 MED ORDER — POTASSIUM CHLORIDE CRYS ER 20 MEQ PO TBCR: 40.0000 meq | EXTENDED_RELEASE_TABLET | Freq: Once | ORAL | Status: DC

## 2022-10-10 MED ORDER — CHLORHEXIDINE GLUCONATE CLOTH 2 % EX PADS
6.0000 | MEDICATED_PAD | Freq: Every day | CUTANEOUS | Status: DC
  Administered 2022-10-10 – 2022-10-15 (×6): 6 via TOPICAL

## 2022-10-10 MED ORDER — ATORVASTATIN CALCIUM 10 MG PO TABS
20.0000 mg | ORAL_TABLET | Freq: Every day | ORAL | Status: DC
  Administered 2022-10-10 – 2022-10-15 (×6): 20 mg via ORAL
  Filled 2022-10-10 (×6): qty 2

## 2022-10-10 MED ORDER — ORAL CARE MOUTH RINSE: 15.0000 mL | OROMUCOSAL | Status: DC | PRN

## 2022-10-10 MED ORDER — LOSARTAN POTASSIUM 50 MG PO TABS
50.0000 mg | ORAL_TABLET | Freq: Every day | ORAL | Status: DC
  Administered 2022-10-10 – 2022-10-13 (×4): 50 mg via ORAL
  Filled 2022-10-10 (×5): qty 1

## 2022-10-10 MED ORDER — LEVOTHYROXINE SODIUM 75 MCG PO TABS
75.0000 ug | ORAL_TABLET | Freq: Every day | ORAL | Status: DC
  Administered 2022-10-10 – 2022-10-15 (×6): 75 ug via ORAL
  Filled 2022-10-10 (×6): qty 1

## 2022-10-10 MED ORDER — PREDNISONE 5 MG PO TABS
2.5000 mg | ORAL_TABLET | Freq: Every day | ORAL | Status: DC
  Administered 2022-10-10 – 2022-10-15 (×6): 2.5 mg via ORAL
  Filled 2022-10-10 (×6): qty 1

## 2022-10-10 NOTE — Progress Notes (Signed)
89 year old female with prior history of descending thoracic aortic dissection s/p TEVAR in 2021 who presented with chest pain, noted to have new type B aortic dissection in the setting of hypertensive emergency when she presented with SBP in 200s   At this time patient denies chest pain or abdominal pain  Physical exam: General: Chronically ill-appearing female, lying on the bed HEENT: [place]/AT, eyes anicteric.  moist mucus membranes Neuro: Awake, following commands, moving all 4 extremities Chest: Coarse breath sounds, no wheezes or rhonchi Heart: Regular rate and rhythm, strong systolic murmur best heard in aortic area Abdomen: Soft, nontender, nondistended, bowel sounds present Skin: No rash  Labs and images were reviewed  Assessment and plan: New type B aortic dissection Increase ascending aortic aneurysm to 4.4 cm History of type B aortic dissection s/p TEVAR in 2021 Hypertensive emergency Hypokalemia/Hyponatremia Rheumatoid arthritis on immunosuppression with methotrexate and prednisone Hypothyroidism Dementia  Appreciate vascular surgery consult Recommend conservative management with controlled blood pressure Currently on clevidipine infusion Started on Coreg and losartan, titrate clevidipine with SBP goal less than 120 and heart rate between 60-80 Repeat CTA chest and abdomen in 48 hours Continue aggressive electrolyte replacement   The patient is critically ill due to type B aortic dissection in the setting of hypertensive emergency.  Critical care was necessary to treat or prevent imminent or life-threatening deterioration.  Critical care was time spent personally by me on the following activities: development of treatment plan with patient and/or surrogate as well as nursing, discussions with consultants, evaluation of patient's response to treatment, examination of patient, obtaining history from patient or surrogate, ordering and performing treatments and interventions,  ordering and review of laboratory studies, ordering and review of radiographic studies, pulse oximetry, re-evaluation of patient's condition and participation in multidisciplinary rounds.   During this encounter critical care time was devoted to patient care services described in this note for 35 minutes.     Cheri Fowler, MD [facility] See Amion for pager If no response to pager, please call [phone] until 7pm After 7pm, Please call [facility] [phone]

## 2022-10-10 NOTE — H&P (Addendum)
NAME:  Gloria [name], MRN:  [id number], DOB:  1926/01/11, LOS: 0 ADMISSION DATE:  10/10/2022, CONSULTATION DATE:  10/10/22 REFERRING MD:  EDP, CHIEF COMPLAINT:  chest pain   History of Present Illness:  Felechia Fitz is a 89 y/o F with PMH significant for collagen vascular disease with ascending thoracic aneurysm s/p stent, RA on MTX, dementia, HTN, squamous and basal cell carcinoma who presented with chest pain and upper back pain.   She was hypertensive in the ED and CTA showed early thoracic aortic dissection or penetrating ulcer resulting in acute Type B intramural hematoma. She was started on cleviprex and transferred to [facility] for further evaluation.   Pertinent  Medical History   has a past medical history of Actinic keratosis, Arthritis, Basal cell carcinoma, Collagen vascular disease (HCC), Heartburn, Hypertension, and Squamous cell carcinoma of skin (05/06/2021).   Significant Hospital Events: Including procedures, antibiotic start and stop dates in addition to other pertinent events   7/7 transferred from [facility] on cleviprex  Interim History / Subjective:  Pt awake and alert on arrival, on cleviprex, c/o intermittent chest and back pain with SBP 140-150  Objective   Temperature 97.9 F (36.6 C), temperature source Oral, weight 63.3 kg.       No intake or output data in the 24 hours ending 10/10/22 0106 Filed Weights   10/10/22 0045  Weight: 63.3 kg    General:  elderly F, awake and in no acute distress HEENT: MM pink/moist Neuro: alert to person, disoriented to place, following commands, speech clear CV: s1s2 rrr, no m/r/g PULM:  clear bilaterally on room air GI: soft, non-tender  Extremities: warm/dry, no edema  Skin: no rashes or lesions   Resolved Hospital Problem list     Assessment & Plan:    Type B aortic dissection  Hypertensive Emergency History of prior ascending thoracic aneurysm with graft  -admit to ICU, continue cleviprex to maintain SBP <120,  SBP goal discussed with vascular -vascular surgery consult in the AM -telemetry -discussed with patient and family, she is DNR, but are open to all other interventions including surgery if indicated  -repeat CT in 48hrs per surgery -continue home Coreg -echo   History of RA  Hold methotrexate for now, continue daily prednisone   Hypothyroidism -continue Synthroid       Best Practice (right click and "Reselect all SmartList Selections" daily)   Diet/type: NPO DVT prophylaxis: SCD GI prophylaxis: N/A Lines: N/A Foley:  N/A Code Status:  DNR Last date of multidisciplinary goals of care discussion [pending, code status verified with son in law at the bedside]  Labs   CBC: Recent Labs  Lab 10/09/22 1852  WBC 9.2  HGB 11.1*  HCT 34.8*  MCV 90.6  PLT 226    Basic Metabolic Panel: Recent Labs  Lab 10/09/22 1852  NA 135  K 3.5  CL 102  CO2 24  GLUCOSE 115*  BUN 13  CREATININE 0.66  CALCIUM 8.6*   GFR: Estimated Creatinine Clearance: 40.9 mL/min (by C-G formula based on SCr of 0.66 mg/dL). Recent Labs  Lab 10/09/22 1852  WBC 9.2    Liver Function Tests: No results for input(s): "AST", "ALT", "ALKPHOS", "BILITOT", "PROT", "ALBUMIN" in the last 168 hours. No results for input(s): "LIPASE", "AMYLASE" in the last 168 hours. No results for input(s): "AMMONIA" in the last 168 hours.  ABG    Component Value Date/Time   PHART 7.403 04/11/2019 1151   PCO2ART 34.1 04/11/2019 1151  PO2ART 170.0 (H) 04/11/2019 1151   HCO3 21.3 04/11/2019 1151   TCO2 22 04/11/2019 1151   ACIDBASEDEF 3.0 (H) 04/11/2019 1151   O2SAT 100.0 04/11/2019 1151     Coagulation Profile: No results for input(s): "INR", "PROTIME" in the last 168 hours.  Cardiac Enzymes: No results for input(s): "CKTOTAL", "CKMB", "CKMBINDEX", "TROPONINI" in the last 168 hours.  HbA1C: Hemoglobin A1C  Date/Time Value Ref Range Status  08/26/2022 12:00 AM 5.6  Final    CBG: No results for  input(s): "GLUCAP" in the last 168 hours.  Review of Systems:   Please see the history of present illness. All other systems reviewed and are negative    Past Medical History:  She,  has a past medical history of Actinic keratosis, Arthritis, Basal cell carcinoma, Collagen vascular disease (HCC), Heartburn, Hypertension, and Squamous cell carcinoma of skin (05/06/2021).   Surgical History:   Past Surgical History:  Procedure Laterality Date   ABDOMINAL HYSTERECTOMY     BREAST CYST EXCISION     HIP ARTHROPLASTY Right 11/30/2019   Procedure: ARTHROPLASTY BIPOLAR HIP (HEMIARTHROPLASTY);  Surgeon: Christena Flake, MD;  Location: [facility] ORS;  Service: Orthopedics;  Laterality: Right;   SACROPLASTY N/A 12/17/2020   Procedure: SACROPLASTY;  Surgeon: Kennedy Bucker, MD;  Location: [facility] ORS;  Service: Orthopedics;  Laterality: N/A;   THORACIC AORTIC ENDOVASCULAR STENT GRAFT N/A 04/11/2019   Procedure: THORACIC AORTIC ENDOVASCULAR STENT GRAFT insertion;  Surgeon: Nada Libman, MD;  Location: [facility] OR;  Service: Vascular;  Laterality: N/A;   ULTRASOUND GUIDANCE FOR VASCULAR ACCESS Right 04/11/2019   Procedure: Ultrasound Guidance For Vascular Access, right femoral artery;  Surgeon: Nada Libman, MD;  Location: [facility] OR;  Service: Vascular;  Laterality: Right;     Social History:   reports that she has never smoked. She has never used smokeless tobacco. She reports that she does not drink alcohol and does not use drugs.   Family History:  Her family history is negative for Kidney disease and Bladder Cancer.   Allergies Allergies  Allergen Reactions   Amoxicillin Other (See Comments)    Intolerant  Daughter and Son report patient tolerated course of Augmentin in January 2024   Codeine Nausea Only   Ibandronate Sodium    Ibandronate Sodium  [Ibandronic Acid] Other (See Comments)    Gi upset   Risedronate Other (See Comments)    Gi upset   Risedronate Sodium    Sulfa Antibiotics Hives      Home Medications  Prior to Admission medications   Medication Sig Start Date End Date Taking? Authorizing Provider  acetaminophen (TYLENOL) 325 MG tablet Take 2 tablets (650 mg total) by mouth every 4 (four) hours as needed for moderate pain. 12/03/19   Arrien, York Ram, MD  atorvastatin (LIPITOR) 20 MG tablet Take 20 mg by mouth daily.    [provider]  Calcium Carbonate (CALCIUM 600 PO) Give one tablet by mouth two times daily.    [provider]  carvedilol (COREG) 6.25 MG tablet Take 6.25 mg by mouth 2 (two) times daily with a meal.    [provider]  folic acid (FOLVITE) 1 MG tablet Take 1 mg by mouth daily.    [provider]  ketoconazole (NIZORAL) 2 % shampoo Apply 1 Application topically 2 (two) times a week. Wednesday and Saturday.    [provider]  levothyroxine (SYNTHROID, LEVOTHROID) 75 MCG tablet Take 75 mcg by mouth daily.  02/01/14   [provider]  losartan (COZAAR) 50 MG tablet Take 1 tablet (50 mg total) by mouth daily. 08/31/22   Sharon Seller, NP  methotrexate (RHEUMATREX) 2.5 MG tablet Take 15 mg by mouth every Friday.    [provider]  Multiple Vitamin (MULTIVITAMIN WITH MINERALS) TABS tablet Take 1 tablet by mouth daily.    [provider]  nitrofurantoin, macrocrystal-monohydrate, (MACROBID) 100 MG capsule Take 1 capsule (100 mg total) by mouth daily. 05/18/22   MacDiarmid, Lorin Picket, MD  predniSONE (DELTASONE) 5 MG tablet Take 2.5 mg by mouth daily. 02/23/19   [provider]  Propylene Glycol (SYSTANE BALANCE OP) Place 1 drop into both eyes 3 (three) times daily as needed. Instill one drop in both eyes every 12 hours as needed for dry eyes.    [provider]  senna (SENOKOT) 8.6 MG TABS tablet Take 1 tablet by mouth daily.    [provider]  sertraline (ZOLOFT) 50 MG tablet Take 50 mg by mouth daily.    [provider]  triamcinolone cream  (KENALOG) 0.1 % Apply 1 Application topically as directed. Apply to trunk, arms and legs 3 days a week Monday, Wednesday, Friday, for atopic dermatitis and stasis dermatitis, avoid face, groin, axilla 08/11/22   Deirdre Evener, MD     Critical care time:  40 minutes      CRITICAL CARE Performed by: Darcella Gasman Trevon Strothers   Total critical care time: 40 minutes  Critical care time was exclusive of separately billable procedures and treating other patients.  Critical care was necessary to treat or prevent imminent or life-threatening deterioration.  Critical care was time spent personally by me on the following activities: development of treatment plan with patient and/or surrogate as well as nursing, discussions with consultants, evaluation of patient's response to treatment, examination of patient, obtaining history from patient or surrogate, ordering and performing treatments and interventions, ordering and review of laboratory studies, ordering and review of radiographic studies, pulse oximetry and re-evaluation of patient's condition.   Darcella Gasman Declyn Offield, PA-C [facility] See Amion for pager If no response to pager , please call [phone] until 7pm After 7:00 pm call Elink  [phone]

## 2022-10-10 NOTE — Consult Note (Signed)
Hospital Consult    Reason for Consult:  descending aortic dissection Referring Physician:  Dr. Merrily Pew MRN #:  [id number]  History of Present Illness: This is a 89 y.o. female initially underwent stent graft of her thoracic aorta for symptomatic intramural hematoma.  This was performed in 2021 with Gore C tag 31 x 20 and 31 x 10 cm device is.  She had done well since that time.  She states that yesterday she developed chest pain radiating to her back that was sometime in the evening.  Per the record she was taken to the emergency department where her initial systolic blood pressure was 229 mmHg.  She has now been transferred to the ICU at [facility].  She states that her chest and back pain have resolved with control of her blood pressure.  Any complaints at the time of my evaluation.  Past Medical History:  Diagnosis Date   Actinic keratosis    Arthritis    Basal cell carcinoma    Left chin, right nasolabial   Collagen vascular disease (HCC)    RA   Heartburn    Hypertension    Squamous cell carcinoma of skin 05/06/2021   left hand, treated with [facility]    Past Surgical History:  Procedure Laterality Date   ABDOMINAL HYSTERECTOMY     BREAST CYST EXCISION     HIP ARTHROPLASTY Right 11/30/2019   Procedure: ARTHROPLASTY BIPOLAR HIP (HEMIARTHROPLASTY);  Surgeon: Christena Flake, MD;  Location: [facility] ORS;  Service: Orthopedics;  Laterality: Right;   SACROPLASTY N/A 12/17/2020   Procedure: SACROPLASTY;  Surgeon: Kennedy Bucker, MD;  Location: [facility] ORS;  Service: Orthopedics;  Laterality: N/A;   THORACIC AORTIC ENDOVASCULAR STENT GRAFT N/A 04/11/2019   Procedure: THORACIC AORTIC ENDOVASCULAR STENT GRAFT insertion;  Surgeon: Nada Libman, MD;  Location: [facility] OR;  Service: Vascular;  Laterality: N/A;   ULTRASOUND GUIDANCE FOR VASCULAR ACCESS Right 04/11/2019   Procedure: Ultrasound Guidance For Vascular Access, right femoral artery;  Surgeon: Nada Libman, MD;  Location: [facility] OR;  Service: Vascular;   Laterality: Right;    Allergies  Allergen Reactions   Amoxicillin Other (See Comments)    Intolerant  Daughter and Son report patient tolerated course of Augmentin in January 2024   Codeine Nausea Only   Ibandronate Sodium  [Ibandronic Acid] Other (See Comments)    Gi upset   Risedronate Other (See Comments)    Gi upset   Sulfa Antibiotics Hives    Prior to Admission medications   Medication Sig Start Date End Date Taking? Authorizing Provider  acetaminophen (TYLENOL) 325 MG tablet Take 2 tablets (650 mg total) by mouth every 4 (four) hours as needed for moderate pain. 12/03/19   Arrien, York Ram, MD  atorvastatin (LIPITOR) 20 MG tablet Take 20 mg by mouth daily.    [provider]  Calcium Carbonate (CALCIUM 600 PO) Give one tablet by mouth two times daily.    [provider]  carvedilol (COREG) 6.25 MG tablet Take 6.25 mg by mouth 2 (two) times daily with a meal.    [provider]  folic acid (FOLVITE) 1 MG tablet Take 1 mg by mouth daily.    [provider]  ketoconazole (NIZORAL) 2 % shampoo Apply 1 Application topically 2 (two) times a week. Wednesday and Saturday.    [provider]  levothyroxine (SYNTHROID, LEVOTHROID) 75 MCG tablet Take 75 mcg by mouth daily.  02/01/14   [provider]  losartan (COZAAR) 50 MG  tablet Take 1 tablet (50 mg total) by mouth daily. 08/31/22   Sharon Seller, NP  methotrexate (RHEUMATREX) 2.5 MG tablet Take 15 mg by mouth every Friday.    [provider]  Multiple Vitamin (MULTIVITAMIN WITH MINERALS) TABS tablet Take 1 tablet by mouth daily.    [provider]  nitrofurantoin, macrocrystal-monohydrate, (MACROBID) 100 MG capsule Take 1 capsule (100 mg total) by mouth daily. 05/18/22   MacDiarmid, Lorin Picket, MD  predniSONE (DELTASONE) 5 MG tablet Take 2.5 mg by mouth daily. 02/23/19   [provider]  Propylene Glycol (SYSTANE BALANCE OP) Place 1 drop into both eyes  3 (three) times daily as needed. Instill one drop in both eyes every 12 hours as needed for dry eyes.    [provider]  senna (SENOKOT) 8.6 MG TABS tablet Take 1 tablet by mouth daily.    [provider]  sertraline (ZOLOFT) 50 MG tablet Take 50 mg by mouth daily.    [provider]  triamcinolone cream (KENALOG) 0.1 % Apply 1 Application topically as directed. Apply to trunk, arms and legs 3 days a week Monday, Wednesday, Friday, for atopic dermatitis and stasis dermatitis, avoid face, groin, axilla 08/11/22   Deirdre Evener, MD    Social History   Socioeconomic History   Marital status: Widowed    Spouse name: Not on file   Number of children: Not on file   Years of education: Not on file   Highest education level: Not on file  Occupational History   Not on file  Tobacco Use   Smoking status: Never   Smokeless tobacco: Never  Vaping Use   Vaping Use: Never used  Substance and Sexual Activity   Alcohol use: No    Alcohol/week: 0.0 standard drinks of alcohol   Drug use: Never   Sexual activity: Not on file  Other Topics Concern   Not on file  Social History Narrative   Not on file   Social Determinants of Health   Financial Resource Strain: Not on file  Food Insecurity: No Food Insecurity (06/03/2022)   Hunger Vital Sign    Worried About Running Out of Food in the Last Year: Never true    Ran Out of Food in the Last Year: Never true  Transportation Needs: No Transportation Needs (06/03/2022)   PRAPARE - Administrator, Civil Service (Medical): No    Lack of Transportation (Non-Medical): No  Physical Activity: Not on file  Stress: Not on file  Social Connections: Not on file  Intimate Partner Violence: Not At Risk (06/03/2022)   Humiliation, Afraid, Rape, and Kick questionnaire    Fear of Current or Ex-Partner: No    Emotionally Abused: No    Physically Abused: No    Sexually Abused: No    Family History  Problem Relation Age  of Onset   Kidney disease Neg Hx    Bladder Cancer Neg Hx    Review of Systems  Constitutional: Negative.   HENT: Negative.    Eyes: Negative.   Respiratory: Negative.    Cardiovascular: Negative.   Gastrointestinal: Negative.   Musculoskeletal: Negative.   Skin: Negative.   Neurological: Negative.   Endo/Heme/Allergies: Negative.   Psychiatric/Behavioral: Negative.        Physical Examination  Vitals:   10/10/22 0745 10/10/22 0800  BP: (!) 117/57 (!) 119/52  Pulse: 61 67  Resp: 17 15  Temp:    SpO2: 95% 96%   Body mass  index is 21.86 kg/m.  Physical Exam HENT:     Head: Normocephalic.     Mouth/Throat:     Mouth: Mucous membranes are moist.  Eyes:     Pupils: Pupils are equal, round, and reactive to light.  Cardiovascular:     Rate and Rhythm: Normal rate.     Pulses:          Radial pulses are 2+ on the right side and 2+ on the left side.       Femoral pulses are 2+ on the right side and 2+ on the left side. Pulmonary:     Effort: Pulmonary effort is normal.  Abdominal:     General: Abdomen is flat.     Palpations: Abdomen is soft. There is no mass.  Musculoskeletal:        General: Normal range of motion.     Right lower leg: No edema.     Left lower leg: No edema.  Skin:    General: Skin is warm.     Capillary Refill: Capillary refill takes less than 2 seconds.  Neurological:     General: No focal deficit present.     Mental Status: She is alert.  Psychiatric:        Mood and Affect: Mood normal.      CBC    Component Value Date/Time   WBC 13.4 (H) 10/10/2022 0650   RBC 3.67 (L) 10/10/2022 0650   HGB 10.6 (L) 10/10/2022 0650   HCT 32.2 (L) 10/10/2022 0650   PLT 201 10/10/2022 0650   MCV 87.7 10/10/2022 0650   MCH 28.9 10/10/2022 0650   MCHC 32.9 10/10/2022 0650   RDW 17.4 (H) 10/10/2022 0650   LYMPHSABS 0.8 06/03/2022 1117   MONOABS 0.9 06/03/2022 1117   EOSABS 1.2 (H) 06/03/2022 1117   BASOSABS 0.1 06/03/2022 1117    BMET     Component Value Date/Time   NA 134 (L) 10/10/2022 0650   NA 140 09/13/2022 0000   K 3.0 (L) 10/10/2022 0650   CL 100 10/10/2022 0650   CO2 25 10/10/2022 0650   GLUCOSE 119 (H) 10/10/2022 0650   BUN 10 10/10/2022 0650   BUN 11 09/13/2022 0000   CREATININE 0.63 10/10/2022 0650   CALCIUM 8.2 (L) 10/10/2022 0650   GFRNONAA >60 10/10/2022 0650   GFRAA >60 12/03/2019 0430    COAGS: Lab Results  Component Value Date   INR 1.0 12/16/2020   INR 1.1 11/21/2020   INR 1.0 11/30/2019     Non-Invasive Vascular Imaging:    CTA IMPRESSION: 1. New descending thoracic aortic dissection extending from the level of the mid left atrium inferiorly ending at the level of the superior mesenteric artery. The mid descending thoracic aorta has increased in size now measuring up to 3.9 cm. 2. Ascending aortic aneurysm measuring 4.4 cm has increased in size. 3. Stable small bilateral pleural effusions with bibasilar atelectasis. 4. Stable small pulmonary nodules measuring up to 3 mm. 5. Cholelithiasis. 6. 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   ASSESSMENT/PLAN: This is a 89 y.o. female with descending thoracic dissection distal to her previous stent graft.  Thankfully her pain has resolved with control of her blood pressure.  I reviewed her previous and current CTs appears that she has a dissection just distal to the previous stent graft on the posterior wall of the aorta with dissection extending up proximally around the stent graft and down to the visceral segment but without  any evidence of visceral malperfusion.  Given that her pain is well-controlled there is no indication for surgical intervention at this time.  Likely will require repeat scanning in the coming days.  Shey Bartmess C. Randie Heinz, MD [facility] [place] Office: [phone] Pager: [phone]

## 2022-10-10 NOTE — Progress Notes (Signed)
eLink Physician-Brief Progress Note Patient Name: Gloria [name] DOB: 04/04/1926 MRN: [id number]   Date of Service  10/10/2022  HPI/Events of Note  I was asked to re order the Clevidipine which was not showing up in [facility] but was infusing at 10 mg dose. Also was asked to update code status. Clarified with ground team patient is DNR , no intubation as well.   eICU Interventions  Orders placed      Intervention Category Major Interventions: Hypertension - evaluation and management  Genaro Bekker G Basha Krygier 10/10/2022, 5:53 AM

## 2022-10-11 ENCOUNTER — Inpatient Hospital Stay (HOSPITAL_COMMUNITY): Payer: Medicare PPO

## 2022-10-11 DIAGNOSIS — I158 Other secondary hypertension: Secondary | ICD-10-CM | POA: Diagnosis not present

## 2022-10-11 DIAGNOSIS — I71012 Dissection of descending thoracic aorta: Secondary | ICD-10-CM

## 2022-10-11 LAB — CBC
HCT: 32.2 % — ABNORMAL LOW (ref 36.0–46.0)
Hemoglobin: 10.5 g/dL — ABNORMAL LOW (ref 12.0–15.0)
MCH: 28.9 pg (ref 26.0–34.0)
MCHC: 32.6 g/dL (ref 30.0–36.0)
MCV: 88.7 fL (ref 80.0–100.0)
Platelets: 179 10*3/uL (ref 150–400)
RBC: 3.63 MIL/uL — ABNORMAL LOW (ref 3.87–5.11)
RDW: 17.9 % — ABNORMAL HIGH (ref 11.5–15.5)
WBC: 14.2 10*3/uL — ABNORMAL HIGH (ref 4.0–10.5)
nRBC: 0 % (ref 0.0–0.2)

## 2022-10-11 LAB — ECHOCARDIOGRAM COMPLETE
AR max vel: 1.82 cm2
AV Area VTI: 1.91 cm2
AV Area mean vel: 1.71 cm2
AV Mean grad: 17 mmHg
AV Peak grad: 24.8 mmHg
Ao pk vel: 2.49 m/s
Area-P 1/2: 3.85 cm2
S' Lateral: 2.6 cm
Weight: 2232.82 [oz_av]

## 2022-10-11 LAB — BASIC METABOLIC PANEL
Anion gap: 7 (ref 5–15)
BUN: 10 mg/dL (ref 8–23)
CO2: 24 mmol/L (ref 22–32)
Calcium: 8.3 mg/dL — ABNORMAL LOW (ref 8.9–10.3)
Chloride: 104 mmol/L (ref 98–111)
Creatinine, Ser: 0.6 mg/dL (ref 0.44–1.00)
GFR, Estimated: >60 mL/min (ref >60)
Glucose, Bld: 112 mg/dL — ABNORMAL HIGH (ref 70–99)
Potassium: 3.7 mmol/L (ref 3.5–5.1)
Sodium: 135 mmol/L (ref 135–145)

## 2022-10-11 MED ORDER — ENOXAPARIN SODIUM 40 MG/0.4ML IJ SOSY
40.0000 mg | PREFILLED_SYRINGE | Freq: Every day | INTRAMUSCULAR | Status: DC
  Administered 2022-10-11 – 2022-10-15 (×5): 40 mg via SUBCUTANEOUS
  Filled 2022-10-11 (×5): qty 0.4

## 2022-10-11 MED ORDER — POTASSIUM CHLORIDE CRYS ER 20 MEQ PO TBCR
40.0000 meq | EXTENDED_RELEASE_TABLET | Freq: Once | ORAL | Status: AC
  Administered 2022-10-11: 40 meq via ORAL
  Filled 2022-10-11: qty 2

## 2022-10-11 MED ORDER — HYDRALAZINE HCL 20 MG/ML IJ SOLN
10.0000 mg | INTRAMUSCULAR | Status: DC | PRN
  Administered 2022-10-11 – 2022-10-12 (×4): 20 mg via INTRAVENOUS
  Filled 2022-10-11 (×4): qty 1

## 2022-10-11 MED ORDER — LABETALOL HCL 5 MG/ML IV SOLN: 20.0000 mg | INTRAVENOUS | Status: DC | PRN

## 2022-10-11 MED ORDER — CARVEDILOL 12.5 MG PO TABS
12.5000 mg | ORAL_TABLET | Freq: Two times a day (BID) | ORAL | Status: DC
  Administered 2022-10-11 – 2022-10-15 (×9): 12.5 mg via ORAL
  Filled 2022-10-11 (×9): qty 1

## 2022-10-11 MED ORDER — HALOPERIDOL LACTATE 5 MG/ML IJ SOLN: 2.0000 mg | Freq: Once | INTRAMUSCULAR | Status: DC

## 2022-10-11 MED ORDER — AMLODIPINE BESYLATE 5 MG PO TABS
5.0000 mg | ORAL_TABLET | Freq: Every day | ORAL | Status: DC
  Administered 2022-10-11 – 2022-10-12 (×2): 5 mg via ORAL
  Filled 2022-10-11 (×2): qty 1

## 2022-10-11 NOTE — Progress Notes (Addendum)
  Progress Note    10/11/2022 6:48 AM Hospital Day 1  Subjective:  sleeping in her side.  Wakes easily.  Denies any pain in her chest, back or abdomen.  She denies any pain in her feet.   Afebrile HR 60's-70's  110's-130's systolic  Gtts:  Cleviprex   Vitals:   10/11/22 0415 10/11/22 0500  BP: 133/61 126/62  Pulse: 73 71  Resp: 13 (!) 23  Temp:    SpO2: 95% 90%    Physical Exam: General:  no distress Lungs:  non labored Extremities:  palpable radial pulses bilaterally; bilateral feet are warm and well perfused.   CBC    Component Value Date/Time   WBC 13.4 (H) 10/10/2022 0650   RBC 3.67 (L) 10/10/2022 0650   HGB 10.6 (L) 10/10/2022 0650   HCT 32.2 (L) 10/10/2022 0650   PLT 201 10/10/2022 0650   MCV 87.7 10/10/2022 0650   MCH 28.9 10/10/2022 0650   MCHC 32.9 10/10/2022 0650   RDW 17.4 (H) 10/10/2022 0650   LYMPHSABS 0.8 06/03/2022 1117   MONOABS 0.9 06/03/2022 1117   EOSABS 1.2 (H) 06/03/2022 1117   BASOSABS 0.1 06/03/2022 1117    BMET    Component Value Date/Time   NA 135 10/11/2022 0213   NA 140 09/13/2022 0000   K 3.7 10/11/2022 0213   CL 104 10/11/2022 0213   CO2 24 10/11/2022 0213   GLUCOSE 112 (H) 10/11/2022 0213   BUN 10 10/11/2022 0213   BUN 11 09/13/2022 0000   CREATININE 0.60 10/11/2022 0213   CALCIUM 8.3 (L) 10/11/2022 0213   GFRNONAA >60 10/11/2022 0213   GFRAA >60 12/03/2019 0430    INR    Component Value Date/Time   INR 1.0 12/16/2020 2142     Intake/Output Summary (Last 24 hours) at 10/11/2022 0648 Last data filed at 10/11/2022 0500 Gross per 24 hour  Intake 531.66 ml  Output 600 ml  Net -68.34 ml     Assessment/Plan:  89 y.o. female with hx of stent graft of her thoracic aorta for symptomatic intramural hematoma. This was performed in 2021 with Gore C tag 31 x 20 and 31 x 10 cm device by Dr. Myra Gianotti and presented to the hospital with chest pain and found to have  descending thoracic dissection distal to her previous stent graft  admitted to ICU for BP control. Hospital Day 1  -pt pain continues to be resolved with improved BP control.  She is on Cleviprex currently.  She will need repeat CTA-timing to be determined by Dr. Myra Gianotti.  Renal function is normal.  Hgb yesterday down slightly but stable from previous CBC in May at 10.6    Doreatha Massed, PA-C [facility] [phone] 10/11/2022 6:48 AM   I agree with the above.  Son at bedside.  She is pain free.  Will work on getting her off IV BP meds. Plan to repeat CTA tomorrow  Durene Cal

## 2022-10-11 NOTE — Progress Notes (Signed)
   NAME:  Gloria [name], MRN:  [id number], DOB:  04-Oct-1925, LOS: 1 ADMISSION DATE:  10/10/2022, CONSULTATION DATE:  10/11/22 REFERRING MD:  EDP, CHIEF COMPLAINT:  chest pain   History of Present Illness:  Gloria [name] is a 89 y/o F with PMH significant for collagen vascular disease with ascending thoracic aneurysm s/p stent, RA on MTX, dementia, HTN, squamous and basal cell carcinoma who presented with chest pain and upper back pain.   She was hypertensive in the ED and CTA showed early thoracic aortic dissection or penetrating ulcer resulting in acute Type B intramural hematoma. She was started on cleviprex and transferred to [facility] for further evaluation.   Pertinent  Medical History   has a past medical history of Actinic keratosis, Arthritis, Basal cell carcinoma, Collagen vascular disease (HCC), Heartburn, Hypertension, and Squamous cell carcinoma of skin (05/06/2021).   Significant Hospital Events: Including procedures, antibiotic start and stop dates in addition to other pertinent events   7/7 transferred from [facility] on cleviprex, oral anti-HTN initiated  Interim History / Subjective:  Denies chest pain, SOB, headache, visual changes. No complaints. Still requiring quite a bit of cleviprex to maintain BP goals.   Objective   Blood pressure 136/61, pulse 71, temperature 98 F (36.7 C), temperature source Oral, resp. rate 18, weight 63.3 kg, SpO2 90 %.        Intake/Output Summary (Last 24 hours) at 10/11/2022 0729 Last data filed at 10/11/2022 0500 Gross per 24 hour  Intake 514.84 ml  Output 300 ml  Net 214.84 ml   Filed Weights   10/10/22 0045  Weight: 63.3 kg    General:  Thin elderly female in NAD HEENT: [place]/AT, PERRL no JVD Neuro: sleepy, but arouses to voice. Interacts appropriately. Minimal confusion.  CV: RRR, systolic murmur 3/6 PULM:  clear bilateral breath sounds.  GI: soft, non-distended. Hypoactive.   Extremities: no acute deformity or edema.  Skin: grossly  intact.    Resolved Hospital Problem list     Assessment & Plan:   Type B aortic dissection  Hypertensive Emergency History of prior ascending thoracic aneurysm with graft  -admit to ICU, continue cleviprex to maintain SBP <120 -Coreg and losartan -Will add amlodipine as still requiring quite a bit of cleviprex.  -repeat CT in 24 hours per vascular -echo pending -Continue ICU care while requiring cleviprex infusion.   History of RA  -Hold methotrexate for now, continue daily prednisone  Hypothyroidism -continue Synthroid     Best Practice (right click and "Reselect all SmartList Selections" daily)   Diet/type: NPO DVT prophylaxis: SCD GI prophylaxis: N/A Lines: N/A Foley:  N/A Code Status:  DNR Last date of multidisciplinary goals of care discussion [pending, code status verified with son in law at the bedside]  Critical care time:  31 minutes      Joneen Roach, AGACNP-BC [facility]  See Amion for personal pager PCCM on call pager [phone] until 7pm. Please call Elink 7p-7a. [phone]  10/11/2022 7:36 AM

## 2022-10-11 NOTE — Progress Notes (Signed)
Echocardiogram 2D Echocardiogram has been performed.  Gloria [name] 10/11/2022, 9:01 AM

## 2022-10-11 NOTE — Plan of Care (Signed)

## 2022-10-12 ENCOUNTER — Inpatient Hospital Stay (HOSPITAL_COMMUNITY): Payer: Medicare PPO

## 2022-10-12 DIAGNOSIS — I71012 Dissection of descending thoracic aorta: Secondary | ICD-10-CM | POA: Diagnosis not present

## 2022-10-12 LAB — CBC
HCT: 32.6 % — ABNORMAL LOW (ref 36.0–46.0)
Hemoglobin: 10.4 g/dL — ABNORMAL LOW (ref 12.0–15.0)
MCH: 29.4 pg (ref 26.0–34.0)
MCHC: 31.9 g/dL (ref 30.0–36.0)
MCV: 92.1 fL (ref 80.0–100.0)
Platelets: 160 10*3/uL (ref 150–400)
RBC: 3.54 MIL/uL — ABNORMAL LOW (ref 3.87–5.11)
RDW: 18.1 % — ABNORMAL HIGH (ref 11.5–15.5)
WBC: 13.9 10*3/uL — ABNORMAL HIGH (ref 4.0–10.5)
nRBC: 0 % (ref 0.0–0.2)

## 2022-10-12 LAB — BASIC METABOLIC PANEL
Anion gap: 9 (ref 5–15)
BUN: 18 mg/dL (ref 8–23)
CO2: 20 mmol/L — ABNORMAL LOW (ref 22–32)
Calcium: 8.2 mg/dL — ABNORMAL LOW (ref 8.9–10.3)
Chloride: 104 mmol/L (ref 98–111)
Creatinine, Ser: 0.86 mg/dL (ref 0.44–1.00)
GFR, Estimated: >60 mL/min (ref >60)
Glucose, Bld: 104 mg/dL — ABNORMAL HIGH (ref 70–99)
Potassium: 3.9 mmol/L (ref 3.5–5.1)
Sodium: 133 mmol/L — ABNORMAL LOW (ref 135–145)

## 2022-10-12 LAB — TRIGLYCERIDES: Triglycerides: 55 mg/dL (ref <150)

## 2022-10-12 LAB — MAGNESIUM: Magnesium: 1.9 mg/dL (ref 1.7–2.4)

## 2022-10-12 LAB — PHOSPHORUS: Phosphorus: 3.3 mg/dL (ref 2.5–4.6)

## 2022-10-12 MED ORDER — LABETALOL HCL 5 MG/ML IV SOLN: 20.0000 mg | INTRAVENOUS | Status: DC | PRN

## 2022-10-12 MED ORDER — IOHEXOL 350 MG/ML SOLN
75.0000 mL | Freq: Once | INTRAVENOUS | Status: AC | PRN
  Administered 2022-10-12: 75 mL via INTRAVENOUS

## 2022-10-12 MED ORDER — MAGNESIUM SULFATE 2 GM/50ML IV SOLN
2.0000 g | Freq: Once | INTRAVENOUS | Status: AC
  Administered 2022-10-12: 2 g via INTRAVENOUS
  Filled 2022-10-12: qty 50

## 2022-10-12 MED ORDER — SERTRALINE HCL 50 MG PO TABS
50.0000 mg | ORAL_TABLET | Freq: Every day | ORAL | Status: DC
  Administered 2022-10-12 – 2022-10-14 (×3): 50 mg via ORAL
  Filled 2022-10-12 (×3): qty 1

## 2022-10-12 MED ORDER — ENSURE ENLIVE PO LIQD
237.0000 mL | Freq: Two times a day (BID) | ORAL | Status: DC
  Administered 2022-10-12 – 2022-10-15 (×7): 237 mL via ORAL

## 2022-10-12 NOTE — Progress Notes (Addendum)
  Progress Note    10/12/2022 6:44 AM Hospital Day 2  Subjective:  sleeping; wakes easily.  Denies any back, abdominal or back pain.  Denies pain in her feet.  Says she doesn't like to be woken up.   Tm 99.8  HR 60's-70's NSR 100's-130's systolic 95% 2LO2NC  Gtts:  cleviprex is off.    Vitals:   10/12/22 0500 10/12/22 0530  BP: (!) 129/47 107/83  Pulse: 74 66  Resp: 17 20  Temp:    SpO2: 95% 94%    Physical Exam: General:  no distress Lungs:  non labored Extremities:  bilateral feet are warm and well perfused.  Radial pulses are palpable bilaterally.  CBC    Component Value Date/Time   WBC 13.9 (H) 10/12/2022 0127   RBC 3.54 (L) 10/12/2022 0127   HGB 10.4 (L) 10/12/2022 0127   HCT 32.6 (L) 10/12/2022 0127   PLT 160 10/12/2022 0127   MCV 92.1 10/12/2022 0127   MCH 29.4 10/12/2022 0127   MCHC 31.9 10/12/2022 0127   RDW 18.1 (H) 10/12/2022 0127   LYMPHSABS 0.8 06/03/2022 1117   MONOABS 0.9 06/03/2022 1117   EOSABS 1.2 (H) 06/03/2022 1117   BASOSABS 0.1 06/03/2022 1117    BMET    Component Value Date/Time   NA 133 (L) 10/12/2022 0127   NA 140 09/13/2022 0000   K 3.9 10/12/2022 0127   CL 104 10/12/2022 0127   CO2 20 (L) 10/12/2022 0127   GLUCOSE 104 (H) 10/12/2022 0127   BUN 18 10/12/2022 0127   BUN 11 09/13/2022 0000   CREATININE 0.86 10/12/2022 0127   CALCIUM 8.2 (L) 10/12/2022 0127   GFRNONAA >60 10/12/2022 0127   GFRAA >60 12/03/2019 0430    INR    Component Value Date/Time   INR 1.0 12/16/2020 2142     Intake/Output Summary (Last 24 hours) at 10/12/2022 0644 Last data filed at 10/12/2022 0530 Gross per 24 hour  Intake 354.41 ml  Output --  Net 354.41 ml     Assessment/Plan:  89 y.o. female with hx of stent graft of her thoracic aorta for symptomatic intramural hematoma. This was performed in 2021 with Gore C tag 31 x 20 and 31 x 10 cm device by Dr. Myra Gianotti and presented to the hospital with chest pain and found to have  descending thoracic  dissection distal to her previous stent graft admitted to ICU for BP control.   Hospital Day 2  -pt remains pain free and she is off Cleviprex.  BP controlled.  Her extremities are well perfused.   -renal function and hgb remain stable.  For repeat CTA today per Dr. Vista Lawman, PA-C [facility] [phone] 10/12/2022 6:44 AM  No significant change on repeat CTA.  Continue non-operative management with BP control and pain control.  Durene Cal

## 2022-10-12 NOTE — Progress Notes (Signed)
[facility] ADULT ICU REPLACEMENT PROTOCOL   The patient does apply for the [facility] Adult ICU Electrolyte Replacment Protocol based on the criteria listed below:   1.Exclusion criteria: TCTS, ECMO, Dialysis, and Myasthenia Gravis patients 2. Is GFR >/= 30 ml/min? Yes.    Patient's GFR today is >60 3. Is SCr </= 2? Yes.   Patient's SCr is 0.86 mg/dL 4. Did SCr increase >/= 0.5 in 24 hours? No. 5.Pt's weight >40kg  Yes.   6. Abnormal electrolyte(s):   Mg 1.9  7. Electrolytes replaced per protocol 8.  Call MD STAT for K+ </= 2.5, Phos </= 1, or Mag </= 1 Physician:  A. Florestine Avers R Sylvanus Telford 10/12/2022 4:04 AM

## 2022-10-12 NOTE — Evaluation (Addendum)
Occupational Therapy Evaluation Patient Details Name: Gloria [name] MRN: [id number] DOB: 1925-11-29 Today's Date: 10/12/2022   History of Present Illness Pt is a 89 y/o F presenting to ED on 7/7 with sudden chest and back pain, found new descending aortic dissection and increased ascending aortic aneurysm 4.4cm and hypertensive urgency. Started on cleviprex. Plan for non-operative mgmt with BP control. PMH includes dementia, ascending thoracic aneurysm s/p TAVR 04/2019, RA, collagen vascular disorder, HTN, HLD, hypothyroidism, and GERD.   Clinical Impression   Pt questionable historian, reports ind at baseline with ADLs and cannot recall if she uses AD for mobility (per chart review previous admission was using rollator PRN). Pt currently oriented x2, unable to recall date and states she is in the hospital from "a fall". Pt needing set up -max A for ADLs, min-mod A for bed mobility, and mod A for step pivot transfer with RW. VSS on RA throughout session, RN notified O2 left off at end of session with pt satting 94%. Pt presenting with impairments listed below, will follow acutely. Patient will benefit from continued inpatient follow up therapy, <3 hours/day to maximize safety/ind with ADLs/functional mobility.  BP supine 110/51 (67) BP seated 116/54 (70) BP supine post-transfer from [facility] 131/60 (79)       Recommendations for follow up therapy are one component of a multi-disciplinary discharge planning process, led by the attending physician.  Recommendations may be updated based on patient status, additional functional criteria and insurance authorization.   Assistance Recommended at Discharge Frequent or constant Supervision/Assistance  Patient can return home with the following A lot of help with walking and/or transfers;A lot of help with bathing/dressing/bathroom;Direct supervision/assist for medications management;Direct supervision/assist for financial management;Assist for  transportation;Help with stairs or ramp for entrance;Assistance with cooking/housework    Functional Status Assessment  Patient has had a recent decline in their functional status and demonstrates the ability to make significant improvements in function in a reasonable and predictable amount of time.  Equipment Recommendations  Other (comment);Tub/shower seat (RW)    Recommendations for Other Services PT consult     Precautions / Restrictions Precautions Precautions: Fall Precaution Comments: SBP <140 Restrictions Weight Bearing Restrictions: No      Mobility Bed Mobility Overal bed mobility: Needs Assistance Bed Mobility: Supine to Sit, Sit to Supine     Supine to sit: Mod assist Sit to supine: Min assist   General bed mobility comments: assist for trunk elevation and returning BLE's into bed    Transfers Overall transfer level: Needs assistance Equipment used: Rolling walker (2 wheels) Transfers: Sit to/from Stand, Bed to chair/wheelchair/BSC Sit to Stand: Mod assist     Step pivot transfers: Mod assist     General transfer comment: mod A from bed level surface, cues for hand placement      Balance Overall balance assessment: Needs assistance Sitting-balance support: Feet supported Sitting balance-Leahy Scale: Fair Sitting balance - Comments: can reach down toward feet without LOB   Standing balance support: During functional activity, Reliant on assistive device for balance Standing balance-Leahy Scale: Poor Standing balance comment: reliant on RW support                           ADL either performed or assessed with clinical judgement   ADL Overall ADL's : Needs assistance/impaired Eating/Feeding: Set up;Bed level Eating/Feeding Details (indicate cue type and reason): drinking from cup with HOB 30* Grooming: Set up;Bed level   Upper Body  Bathing: Moderate assistance;Sitting   Lower Body Bathing: Moderate assistance;Sitting/lateral  leans   Upper Body Dressing : Moderate assistance;Sitting   Lower Body Dressing: Moderate assistance;Sitting/lateral leans   Toilet Transfer: Moderate assistance;Stand-pivot;BSC/3in1;Rolling walker (2 wheels)   Toileting- Clothing Manipulation and Hygiene: Maximal assistance;Sit to/from stand       Functional mobility during ADLs: Moderate assistance;Rolling walker (2 wheels)       Vision Baseline Vision/History: 1 Wears glasses Vision Assessment?: No apparent visual deficits Additional Comments: able to read clock on wall at ~58ft distance     Perception Perception Perception Tested?: No   Praxis Praxis Praxis tested?: Not tested    Pertinent Vitals/Pain Pain Assessment Pain Assessment: Faces Pain Score: 4  Faces Pain Scale: Hurts little more Pain Location: chest and back of legs Pain Descriptors / Indicators: Discomfort Pain Intervention(s): Limited activity within patient's tolerance, Monitored during session, Repositioned     Hand Dominance Right   Extremity/Trunk Assessment Upper Extremity Assessment Upper Extremity Assessment: Generalized weakness   Lower Extremity Assessment Lower Extremity Assessment: Generalized weakness   Cervical / Trunk Assessment Cervical / Trunk Assessment: Kyphotic   Communication Communication Communication: No difficulties   Cognition Arousal/Alertness: Lethargic Behavior During Therapy: Flat affect Overall Cognitive Status: History of cognitive impairments - at baseline                                 General Comments: hx of dementia, cannot recall if she uses AD at baseline or what AD she has at home, aware she is at "[facility]" but states she is here for a fall (of note was here for fall in 2022), unaware of date     General Comments  VSS on RA throughout, see note for BP    Exercises     Shoulder Instructions      Home Living Family/patient expects to be discharged to:: Private residence Living  Arrangements: Alone   Type of Home: Independent living facility Home Access: Level entry     Home Layout: One level     Bathroom Shower/Tub: Engineer, production Accessibility: Yes   Home Equipment: Rollator (4 wheels);Cane - single point   Additional Comments: AD taken per chart review; states "my daughter doesn't know but I am going to her house when I leave"      Prior Functioning/Environment Prior Level of Function : Independent/Modified Independent             Mobility Comments: pt does not remember, per chart review did use rollator PRN ADLs Comments: ind with ADLs, does not drive, daughter provides transportation        OT Problem List: Decreased strength;Decreased range of motion;Decreased activity tolerance;Impaired balance (sitting and/or standing);Decreased cognition;Decreased safety awareness;Cardiopulmonary status limiting activity      OT Treatment/Interventions: Self-care/ADL training;Therapeutic exercise;Energy conservation;DME and/or AE instruction;Therapeutic activities;Patient/family education;Balance training;Cognitive remediation/compensation    OT Goals(Current goals can be found in the care plan section) Acute Rehab OT Goals Patient Stated Goal: none stated OT Goal Formulation: With patient Time For Goal Achievement: 10/26/22 Potential to Achieve Goals: Good  OT Frequency: Min 1X/week    Co-evaluation              AM-PAC OT "6 Clicks" Daily Activity     Outcome Measure Help from another person eating meals?: None Help from another person taking care of personal grooming?: A Little Help from another person toileting, which includes  using toliet, bedpan, or urinal?: A Lot Help from another person bathing (including washing, rinsing, drying)?: A Lot Help from another person to put on and taking off regular upper body clothing?: A Lot Help from another person to put on and taking off regular lower body clothing?: A Lot 6 Click  Score: 15   End of Session Equipment Utilized During Treatment: Rolling walker (2 wheels);Gait belt Nurse Communication: Mobility status;Other (comment) (O2 left off with pt satting 94% on RA)  Activity Tolerance: Patient tolerated treatment well Patient left: in bed;with call bell/phone within reach;with bed alarm set  OT Visit Diagnosis: Unsteadiness on feet (R26.81);Other abnormalities of gait and mobility (R26.89);Muscle weakness (generalized) (M62.81);History of falling (Z91.81)                Time: 4098-1191 OT Time Calculation (min): 25 min Charges:  OT General Charges $OT Visit: 1 Visit OT Evaluation $OT Eval Moderate Complexity: 1 Mod OT Treatments $Self Care/Home Management : 8-22 mins  Carver Fila, OTD, OTR/L SecureChat Preferred Acute Rehab [phone]    Carver Fila Koonce 10/12/2022, 5:02 PM

## 2022-10-12 NOTE — Progress Notes (Signed)
Patient has arrived to the floor from [facility].

## 2022-10-12 NOTE — Progress Notes (Addendum)
   NAME:  Gloria [name], MRN:  [id number], DOB:  09-04-1925, LOS: 2 ADMISSION DATE:  10/10/2022, CONSULTATION DATE:  10/12/22 REFERRING MD:  EDP, CHIEF COMPLAINT:  chest pain   History of Present Illness:  Gloria [name] is a 89 y/o F with PMH significant for collagen vascular disease with ascending thoracic aneurysm s/p stent, RA on MTX, dementia, HTN, squamous and basal cell carcinoma who presented with chest pain and upper back pain.   She was hypertensive in the ED and CTA showed early thoracic aortic dissection or penetrating ulcer resulting in acute Type B intramural hematoma. She was started on cleviprex and transferred to [facility] for further evaluation.   Pertinent  Medical History   has a past medical history of Actinic keratosis, Arthritis, Basal cell carcinoma, Collagen vascular disease (HCC), Heartburn, Hypertension, and Squamous cell carcinoma of skin (05/06/2021).   Significant Hospital Events: Including procedures, antibiotic start and stop dates in addition to other pertinent events   7/7 transferred from [facility] on cleviprex, oral anti-HTN initiated  Interim History / Subjective:  Off cleviprex since 5pm BP mildly above goal this morning after returning from CT, PRN given.  CTA done this AM, read pending.  Pain free. Denies SOB  Objective   Blood pressure (!) 123/52, pulse 64, temperature (!) 100.4 F (38 C), temperature source Oral, resp. rate 18, weight 63.3 kg, SpO2 95 %.        Intake/Output Summary (Last 24 hours) at 10/12/2022 0917 Last data filed at 10/12/2022 0530 Gross per 24 hour  Intake 216.64 ml  Output --  Net 216.64 ml    Filed Weights   10/10/22 0045  Weight: 63.3 kg   Exam General:  Thin elderly female in NAD HEENT: [place]/AT, PERRL, no JVD,  Neuro: Spontaneously awake, alert, oriented. Non-focal CV: RRR, systolic murmur 3/6 PULM:  Clear bilateral breath sounds, no distress GI: soft, non-distended. Hypoactive.   Extremities: no acute deformity or  edema.  Skin: grossly intact.   Echo 7/8: LVEF 60-65%, grade 1 DD  Resolved Hospital Problem list     Assessment & Plan:   Type B aortic dissection  Hypertensive Emergency History of prior ascending thoracic aneurysm with graft  -Off cleviprex since yesterday evening with the addition of PRN medications.  -Coreg, losartan, amlodipine adjusted -Still requiring some PRN -repeat CT this morning grossly stable -OK to transfer out of ICU. May still need some PRNs for BP until goals are liberalized.  -D/w Vascular surgery, Ok to liberalize SBP goal to < 140 mm Hg   History of RA  -Hold methotrexate for now, continue daily prednisone   Hypothyroidism -continue Synthroid     Best Practice (right click and "Reselect all SmartList Selections" daily)   Diet/type: NPO DVT prophylaxis: SCD GI prophylaxis: N/A Lines: N/A Foley:  N/A Code Status:  DNR Last date of multidisciplinary goals of care discussion []   Critical care time:  31 minutes      Joneen Roach, AGACNP-BC [facility]  See Amion for personal pager PCCM on call pager [phone] until 7pm. Please call Elink 7p-7a. [phone]  10/12/2022 9:17 AM

## 2022-10-13 DIAGNOSIS — E876 Hypokalemia: Secondary | ICD-10-CM | POA: Insufficient documentation

## 2022-10-13 DIAGNOSIS — I71012 Dissection of descending thoracic aorta: Secondary | ICD-10-CM | POA: Diagnosis not present

## 2022-10-13 LAB — BASIC METABOLIC PANEL
Anion gap: 7 (ref 5–15)
BUN: 20 mg/dL (ref 8–23)
CO2: 20 mmol/L — ABNORMAL LOW (ref 22–32)
Calcium: 8.1 mg/dL — ABNORMAL LOW (ref 8.9–10.3)
Chloride: 104 mmol/L (ref 98–111)
Creatinine, Ser: 0.74 mg/dL (ref 0.44–1.00)
GFR, Estimated: >60 mL/min (ref >60)
Glucose, Bld: 106 mg/dL — ABNORMAL HIGH (ref 70–99)
Potassium: 3.2 mmol/L — ABNORMAL LOW (ref 3.5–5.1)
Sodium: 131 mmol/L — ABNORMAL LOW (ref 135–145)

## 2022-10-13 LAB — MAGNESIUM: Magnesium: 2.6 mg/dL — ABNORMAL HIGH (ref 1.7–2.4)

## 2022-10-13 MED ORDER — HYDRALAZINE HCL 20 MG/ML IJ SOLN: 10.0000 mg | INTRAMUSCULAR | Status: DC | PRN

## 2022-10-13 MED ORDER — LABETALOL HCL 5 MG/ML IV SOLN: 20.0000 mg | INTRAVENOUS | Status: DC | PRN

## 2022-10-13 MED ORDER — POTASSIUM CHLORIDE CRYS ER 20 MEQ PO TBCR
40.0000 meq | EXTENDED_RELEASE_TABLET | Freq: Once | ORAL | Status: AC
  Administered 2022-10-13: 40 meq via ORAL
  Filled 2022-10-13: qty 2

## 2022-10-13 MED ORDER — AMLODIPINE BESYLATE 10 MG PO TABS
10.0000 mg | ORAL_TABLET | Freq: Every day | ORAL | Status: DC
  Administered 2022-10-13 – 2022-10-15 (×3): 10 mg via ORAL
  Filled 2022-10-13 (×3): qty 1

## 2022-10-13 MED ORDER — HYDRALAZINE HCL 20 MG/ML IJ SOLN
10.0000 mg | INTRAMUSCULAR | Status: DC | PRN
  Administered 2022-10-13 – 2022-10-15 (×2): 10 mg via INTRAVENOUS
  Filled 2022-10-13 (×2): qty 1

## 2022-10-13 NOTE — Care Management Important Message (Signed)
Important Message  Patient Details  Name: Gloria [name] MRN: [id number] Date of Birth: [birth year]   Medicare Important Message Given:  Yes     Renie Ora 10/13/2022, 8:38 AM

## 2022-10-13 NOTE — Progress Notes (Addendum)
  Progress Note    10/13/2022 8:16 AM * No surgery found *  Subjective:  sitting up eating breakfast.  Denies any back or abd pain.   Vitals:   10/13/22 0728 10/13/22 0729  BP: (!) 153/62 (!) 153/62  Pulse: 60 60  Resp: 20 20  Temp: 98.6 F (37 C) 98.6 F (37 C)  SpO2: 95% 95%   Physical Exam: Lungs:  non labored Extremities:  symmetrical radial pulses; palpable PT pulses L better than R Abdomen:  soft, NT, ND Neurologic: A&O  CBC    Component Value Date/Time   WBC 13.9 (H) 10/12/2022 0127   RBC 3.54 (L) 10/12/2022 0127   HGB 10.4 (L) 10/12/2022 0127   HCT 32.6 (L) 10/12/2022 0127   PLT 160 10/12/2022 0127   MCV 92.1 10/12/2022 0127   MCH 29.4 10/12/2022 0127   MCHC 31.9 10/12/2022 0127   RDW 18.1 (H) 10/12/2022 0127   LYMPHSABS 0.8 06/03/2022 1117   MONOABS 0.9 06/03/2022 1117   EOSABS 1.2 (H) 06/03/2022 1117   BASOSABS 0.1 06/03/2022 1117    BMET    Component Value Date/Time   NA 131 (L) 10/13/2022 0132   NA 140 09/13/2022 0000   K 3.2 (L) 10/13/2022 0132   CL 104 10/13/2022 0132   CO2 20 (L) 10/13/2022 0132   GLUCOSE 106 (H) 10/13/2022 0132   BUN 20 10/13/2022 0132   BUN 11 09/13/2022 0000   CREATININE 0.74 10/13/2022 0132   CALCIUM 8.1 (L) 10/13/2022 0132   GFRNONAA >60 10/13/2022 0132   GFRAA >60 12/03/2019 0430    INR    Component Value Date/Time   INR 1.0 12/16/2020 2142    No intake or output data in the 24 hours ending 10/13/22 0816   Assessment/Plan:  89 y.o. female with hx of stent graft of her thoracic aorta for symptomatic intramural hematoma. This was performed in 2021 with Gore C tag 31 x 20 and 31 x 10 cm device by Dr. Myra Gianotti and presented to the hospital with chest pain and found to have descending thoracic dissection distal to her previous stent graft admitted to ICU for BP control.   BP controlled with p.o. medication.  Goal of about 130 systolic pressure.  Peripheral pulses intact all extremities.  Repeat CTA unchanged.  We  will repeat CTA again in 1 month.   Emilie Rutter, PA-C [facility] [phone] 10/13/2022 8:16 AM

## 2022-10-13 NOTE — [place] FL2 (Signed)
[place] MEDICAID FL2 LEVEL OF CARE FORM     IDENTIFICATION  Patient Name: Gloria [name] Birthdate: [birth year] Sex: female Admission Date (Current Location): 10/10/2022  [facility] and [place] Number:  Producer, television/film/video and Address:  [facility], [address] [place], [place] [address]      Provider Number: [id number]  Attending Physician Name and Address:  Noralee Stain, DO  Relative Name and Phone Number:  Britta Mccreedy (daughter) [phone]    Current Level of Care: Hospital Recommended Level of Care: Skilled Nursing Facility Prior Approval Number:    Date Approved/Denied:   PASRR Number: [id number]  Discharge Plan: SNF    Current Diagnoses: Patient Active Problem List   Diagnosis Date Noted   Hypokalemia 10/13/2022   Dissection of descending thoracic aorta (HCC) 10/10/2022   Other nonthrombocytopenic purpura (HCC) 06/07/2022   Urinary retention 06/05/2022   Malnutrition of moderate degree 06/04/2022   Acute respiratory failure with hypoxia (HCC) 06/03/2022   Recurrent UTI 06/03/2022   Weakness 06/03/2022   Leukocytosis 06/03/2022   Lung nodule seen on imaging study 08/12/2021   Pelvic fracture (HCC) 12/18/2020   Closed pelvic fracture (HCC) 12/16/2020   HLD (hyperlipidemia) 12/16/2020   Depression 12/16/2020   Migraines 02/05/2020   Recurrent major depressive disorder, in full remission (HCC) 12/31/2019   Closed displaced midcervical fracture of right femur (HCC) 12/03/2019   Status post hip hemiarthroplasty 12/03/2019   Closed right hip fracture (HCC) 11/30/2019   Hip fracture (HCC) 11/30/2019   History of fracture of right hip 11/30/2019   Acute pain of right shoulder    Fall    Immunosuppression (HCC) 05/08/2019   Intramural hematoma of thoracic aorta (HCC) 04/08/2019   Thoracic ascending aortic aneurysm (HCC) 04/07/2019   Anemia 02/14/2018   Peripheral vascular disease (HCC) 09/14/2017   Essential hypertension  01/21/2014   Osteopenia 07/19/2013   Hypothyroidism 02/01/2007   GERD 02/01/2007   Rheumatoid arthritis (HCC) 02/01/2007   Disorder of bone and cartilage 02/01/2007   COLONIC POLYPS, HX OF 02/01/2007   Polymyalgia rheumatica (HCC) 04/05/2001    Orientation RESPIRATION BLADDER Height & Weight     Self, Place  Normal Continent, External catheter (External Urinary Catheter) Weight: 138 lb 14.2 oz (63 kg) Height:     BEHAVIORAL SYMPTOMS/MOOD NEUROLOGICAL BOWEL NUTRITION STATUS      Continent Diet (Please see discharge summary)  AMBULATORY STATUS COMMUNICATION OF NEEDS Skin   Limited Assist Verbally Other (Comment) (Ecchymosis, arm, Bilateral, Wound/Incision LDAs)                       Personal Care Assistance Level of Assistance  Bathing, Feeding, Dressing Bathing Assistance: Maximum assistance Feeding assistance: Independent Dressing Assistance: Maximum assistance     Functional Limitations Info  Sight, Hearing, Speech Sight Info: Impaired (eyeglasses) Hearing Info: Adequate Speech Info: Adequate    SPECIAL CARE FACTORS FREQUENCY  PT (By licensed PT), OT (By licensed OT)     PT Frequency: 5x min weekly OT Frequency: 5x min weekly            Contractures Contractures Info: Not present    Additional Factors Info  Code Status, Allergies, Psychotropic Code Status Info: DNR Allergies Info: Amoxicillin,Codeine,Ibandronate Sodium  (ibandronic Acid),Risedronate,Sulfa Antibiotics Psychotropic Info: sertraline (ZOLOFT) tablet 50 mg daily at bedtime         Current Medications (10/13/2022):  This is the current hospital active medication list Current Facility-Administered Medications  Medication Dose Route  Frequency Provider Last Rate Last Admin   amLODipine (NORVASC) tablet 10 mg  10 mg Oral Daily Noralee Stain, DO   10 mg at 10/13/22 1011   atorvastatin (LIPITOR) tablet 20 mg  20 mg Oral Daily Luciano Cutter, MD   20 mg at 10/13/22 1011   carvedilol (COREG)  tablet 12.5 mg  12.5 mg Oral BID WC Duayne Cal, NP   12.5 mg at 10/13/22 1011   Chlorhexidine Gluconate Cloth 2 % PADS 6 each  6 each Topical Daily Luciano Cutter, MD   6 each at 10/13/22 1408   enoxaparin (LOVENOX) injection 40 mg  40 mg Subcutaneous Daily Icard, Bradley L, DO   40 mg at 10/13/22 1010   feeding supplement (ENSURE ENLIVE / ENSURE PLUS) liquid 237 mL  237 mL Oral BID BM Icard, Bradley L, DO   237 mL at 10/13/22 1407   hydrALAZINE (APRESOLINE) injection 10-40 mg  10-40 mg Intravenous Q4H PRN Duayne Cal, NP   20 mg at 10/12/22 0931   labetalol (NORMODYNE) injection 20 mg  20 mg Intravenous Q10 min PRN Duayne Cal, NP       levothyroxine (SYNTHROID) tablet 75 mcg  75 mcg Oral Q0600 Gleason, Darcella Gasman, PA-C   75 mcg at 10/13/22 0545   losartan (COZAAR) tablet 50 mg  50 mg Oral Daily Cheri Fowler, MD   50 mg at 10/13/22 1011   Oral care mouth rinse  15 mL Mouth Rinse PRN Luciano Cutter, MD       predniSONE (DELTASONE) tablet 2.5 mg  2.5 mg Oral Q breakfast Gleason, Darcella Gasman, PA-C   2.5 mg at 10/13/22 1011   sertraline (ZOLOFT) tablet 50 mg  50 mg Oral QHS Duayne Cal, NP   50 mg at 10/12/22 2100     Discharge Medications: Please see discharge summary for a list of discharge medications.  Relevant Imaging Results:  Relevant Lab Results:   Additional Information [id number]  Delilah Shan, LCSWA

## 2022-10-13 NOTE — Progress Notes (Signed)
PROGRESS NOTE    Gloria [name]  ZOX:[id number] DOB: 1925-06-09 DOA: 10/10/2022 PCP: Earnestine Mealing, MD     Brief Narrative:  Gloria [name] is a 89 y/o F with PMH significant for collagen vascular disease with ascending thoracic aneurysm s/p stent, RA on MTX, dementia, HTN, squamous and basal cell carcinoma who presented with chest pain and upper back pain.   She was hypertensive in the ED and CTA showed early thoracic aortic dissection or penetrating ulcer resulting in acute Type B intramural hematoma. She was started on cleviprex and transferred to [facility] for further evaluation.  Vascular surgery consulted.  Patient's blood pressure improved and she was weaned off Cleviprex.  She was transferred to [facility] service 7/10.  New events last 24 hours / Subjective: Patient feeling well this morning, sitting in bed eating breakfast.  Was able to get up to go to the bathroom.  She has no complaints of chest pain or shortness of breath.  Blood pressure 153/62 this morning.  Assessment & Plan:   Principal Problem:   Dissection of descending thoracic aorta (HCC) Active Problems:   Hypothyroidism   Rheumatoid arthritis (HCC)   Type B aortic dissection Hypertensive emergency -Now off Cleviprex -Coreg, losartan, Norvasc dose increased -Vascular surgery following, SBP goal <130 -Follow-up outpatient for repeat CTA in 1 month  History of rheumatoid arthritis -Holding methotrexate for now, continue daily prednisone  Hypothyroidism -Synthroid  Hypokalemia -Replace  DVT prophylaxis:  enoxaparin (LOVENOX) injection 40 mg Start: 10/11/22 1300 Place and maintain sequential compression device Start: 10/11/22 0744  Code Status: DNR Family Communication: None at bedside Disposition Plan: Await PT evaluation.  Likely discharge 7/11 if BP remains controlled Status is: Inpatient Remains inpatient appropriate because: BP management, PT eval pending    Antimicrobials:  Anti-infectives (From  admission, onward)    None        Objective: Vitals:   10/13/22 0100 10/13/22 0357 10/13/22 0728 10/13/22 0729  BP: (!) 93/50 (!) 121/53 (!) 153/62 (!) 153/62  Pulse: 60 60 60 60  Resp: 16 18 20 20   Temp: 98.3 F (36.8 C) 98.6 F (37 C) 98.6 F (37 C) 98.6 F (37 C)  TempSrc: Oral Oral Oral Oral  SpO2: 95% 94% 95% 95%  Weight:  63 kg     No intake or output data in the 24 hours ending 10/13/22 1153 Filed Weights   10/10/22 0045 10/13/22 0357  Weight: 63.3 kg 63 kg    Examination:  General exam: Appears calm and comfortable  Respiratory system: Clear to auscultation. Respiratory effort normal. No respiratory distress. No conversational dyspnea.  Cardiovascular system: S1 & S2 heard, RRR. + Systolic murmur. No pedal edema. Gastrointestinal system: Abdomen is nondistended, soft and nontender. Normal bowel sounds heard. Central nervous system: Alert and oriented. No focal neurological deficits. Speech clear.  Extremities: Symmetric in appearance  Skin: No rashes, lesions or ulcers on exposed skin  Psychiatry: Judgement and insight appear normal. Mood & affect appropriate.   Data Reviewed: I have personally reviewed following labs and imaging studies  CBC: Recent Labs  Lab 10/09/22 1852 10/10/22 0650 10/11/22 0737 10/12/22 0127  WBC 9.2 13.4* 14.2* 13.9*  HGB 11.1* 10.6* 10.5* 10.4*  HCT 34.8* 32.2* 32.2* 32.6*  MCV 90.6 87.7 88.7 92.1  PLT 226 201 179 160   Basic Metabolic Panel: Recent Labs  Lab 10/09/22 1852 10/10/22 0650 10/10/22 1215 10/11/22 0213 10/12/22 0127 10/13/22 0132  NA 135 134*  --  135 133* 131*  K 3.5 3.0*  --  3.7 3.9 3.2*  CL 102 100  --  104 104 104  CO2 24 25  --  24 20* 20*  GLUCOSE 115* 119*  --  112* 104* 106*  BUN 13 10  --  10 18 20   CREATININE 0.66 0.63  --  0.60 0.86 0.74  CALCIUM 8.6* 8.2*  --  8.3* 8.2* 8.1*  MG  --   --  2.0  --  1.9 2.6*  PHOS  --   --   --   --  3.3  --    GFR: Estimated Creatinine Clearance: 40.9  mL/min (by C-G formula based on SCr of 0.74 mg/dL). Liver Function Tests: No results for input(s): "AST", "ALT", "ALKPHOS", "BILITOT", "PROT", "ALBUMIN" in the last 168 hours. No results for input(s): "LIPASE", "AMYLASE" in the last 168 hours. No results for input(s): "AMMONIA" in the last 168 hours. Coagulation Profile: No results for input(s): "INR", "PROTIME" in the last 168 hours. Cardiac Enzymes: No results for input(s): "CKTOTAL", "CKMB", "CKMBINDEX", "TROPONINI" in the last 168 hours. BNP (last 3 results) No results for input(s): "PROBNP" in the last 8760 hours. HbA1C: No results for input(s): "HGBA1C" in the last 72 hours. CBG: No results for input(s): "GLUCAP" in the last 168 hours. Lipid Profile: Recent Labs    10/12/22 0128  TRIG 55   Thyroid Function Tests: No results for input(s): "TSH", "T4TOTAL", "FREET4", "T3FREE", "THYROIDAB" in the last 72 hours. Anemia Panel: No results for input(s): "VITAMINB12", "FOLATE", "FERRITIN", "TIBC", "IRON", "RETICCTPCT" in the last 72 hours. Sepsis Labs: No results for input(s): "PROCALCITON", "LATICACIDVEN" in the last 168 hours.  Recent Results (from the past 240 hour(s))  MRSA Next Gen by PCR, Nasal     Status: None   Collection Time: 10/10/22 12:44 AM   Specimen: Nasal Mucosa; Nasal Swab  Result Value Ref Range Status   MRSA by PCR Next Gen NOT DETECTED NOT DETECTED Final    Comment: (NOTE) The GeneXpert MRSA Assay (FDA approved for NASAL specimens only), is one component of a comprehensive MRSA colonization surveillance program. It is not intended to diagnose MRSA infection nor to guide or monitor treatment for MRSA infections. Test performance is not FDA approved in patients less than 19 years old. Performed at [facility], [address], [place], [place] [address]       Radiology Studies: CT Angio Chest/Abd/Pel for Dissection W and/or W/WO  Result Date: 10/12/2022 CLINICAL DATA:  Thoracic aortic disease.  EXAM: CT ANGIOGRAPHY CHEST, ABDOMEN AND PELVIS TECHNIQUE: Non-contrast CT of the chest was initially obtained. Multidetector CT imaging through the chest, abdomen and pelvis was performed using the standard protocol during bolus administration of intravenous contrast. Multiplanar reconstructed images and MIPs were obtained and reviewed to evaluate the vascular anatomy. RADIATION DOSE REDUCTION: This exam was performed according to the departmental dose-optimization program which includes automated exposure control, adjustment of the mA and/or kV according to patient size and/or use of iterative reconstruction technique. CONTRAST:  75mL OMNIPAQUE IOHEXOL 350 MG/ML SOLN COMPARISON:  October 09, 2022. FINDINGS: CTA CHEST FINDINGS Cardiovascular: Grossly stable 4.6 cm ascending thoracic aortic aneurysm based on my own measurement of prior exam. Great vessels are widely patent. Stent graft is seen involving transverse aortic arch extending into the descending thoracic aorta. There is again noted a dissection involving the descending thoracic aorta, with contrast present from the midportion into the distal portion that is not covered with stent graft. This is grossly  unchanged compared to prior exam. This dissection extends slightly into the proximal abdominal aorta. Mild cardiomegaly. No pericardial effusion. Mediastinum/Nodes: No enlarged mediastinal, hilar, or axillary lymph nodes. Thyroid gland, trachea, and esophagus demonstrate no significant findings. Lungs/Pleura: No pneumothorax is noted. Small to moderate-sized left pleural effusion is noted with associated atelectasis of left lower lobe. Minimal right pleural effusion is noted with associated minimal sub some atelectasis. Probable scarring is noted in right upper lobe. Musculoskeletal: No chest wall abnormality. No acute or significant osseous findings. Review of the MIP images confirms the above findings. CTA ABDOMEN AND PELVIS FINDINGS VASCULAR Aorta:  Atherosclerosis of abdominal aorta is noted without aneurysm formation. As noted above, there is noted a dissection extending from descending thoracic aorta into proximal suprarenal abdominal aorta. Celiac: Severe narrowing is noted with poststenotic dilatation, but no thrombus is noted. SMA: Patent without evidence of aneurysm, dissection, vasculitis or significant stenosis. Renals: Both renal arteries are patent without evidence of aneurysm, dissection, vasculitis, fibromuscular dysplasia or significant stenosis. IMA: Patent without evidence of aneurysm, dissection, vasculitis or significant stenosis. Inflow: Patent without evidence of aneurysm, dissection, vasculitis or significant stenosis. Veins: No obvious venous abnormality within the limitations of this arterial phase study. Review of the MIP images confirms the above findings. NON-VASCULAR Hepatobiliary: Minimal cholelithiasis is noted. No biliary dilatation is noted. Liver is unremarkable. Pancreas: Unremarkable. No pancreatic ductal dilatation or surrounding inflammatory changes. Spleen: Normal in size without focal abnormality. Adrenals/Urinary Tract: Adrenal glands are unremarkable. Kidneys are normal, without renal calculi, focal lesion, or hydronephrosis. Bladder is unremarkable. Stomach/Bowel: Stomach is unremarkable. There is no evidence of bowel obstruction or inflammation. Lymphatic: No adenopathy is noted. Reproductive: Status post hysterectomy. No adnexal masses. Other: No abdominal wall hernia or abnormality. No abdominopelvic ascites. Musculoskeletal: Status post right hip arthroplasty. Status post bilateral sacroplasty. No acute osseous abnormalities noted. Review of the MIP images confirms the above findings. IMPRESSION: Grossly stable 4.6 cm ascending thoracic aortic aneurysm compared to prior exam of October 09, 2022. Recommend semi-annual imaging followup by CTA or MRA and referral to cardiothoracic surgery if not already obtained. This  recommendation follows 2010 ACCF/AHA/AATS/ACR/ASA/SCA/SCAI/SIR/STS/SVM Guidelines for the Diagnosis and Management of Patients With Thoracic Aortic Disease. Circulation. 2010; 121: Z610-R604. Aortic aneurysm NOS (ICD10-I71.9). Stable appearance of stent graft involving transverse aortic arch and descending thoracic aorta. Grossly stable dissection seen involving the descending thoracic aorta which extends into the suprarenal abdominal aorta. Aneurysmal dilatation of descending thoracic aorta is noted at 4 cm. 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 Bilateral pleural effusions are again noted, left greater than right, with associated atelectasis of both lower lobes. Minimal cholelithiasis. Aortic Atherosclerosis (ICD10-I70.0). Electronically Signed   By: Lupita Raider M.D.   On: 10/12/2022 10:40      Scheduled Meds:  amLODipine  10 mg Oral Daily   atorvastatin  20 mg Oral Daily   carvedilol  12.5 mg Oral BID WC   Chlorhexidine Gluconate Cloth  6 each Topical Daily   enoxaparin (LOVENOX) injection  40 mg Subcutaneous Daily   feeding supplement  237 mL Oral BID BM   levothyroxine  75 mcg Oral Q0600   losartan  50 mg Oral Daily   predniSONE  2.5 mg Oral Q breakfast   sertraline  50 mg Oral QHS   Continuous Infusions:   LOS: 3 days   Time spent: 35 minutes   Noralee Stain, DO [facility] 10/13/2022, 11:53 AM  Available via Epic secure chat 7am-7pm After these hours, please refer to coverage provider listed on [email or web address]

## 2022-10-13 NOTE — Evaluation (Signed)
Physical Therapy Evaluation Patient Details Name: Griselda Bramblett MRN: [id number] DOB: 14-Dec-1925 Today's Date: 10/13/2022  History of Present Illness  Pt is a 89 y/o F presenting to ED on 7/7 with sudden chest and back pain, found new descending aortic dissection and increased ascending aortic aneurysm 4.4cm and hypertensive urgency. Started on cleviprex. Plan for non-operative mgmt with BP control. PMH includes dementia, ascending thoracic aneurysm s/p TAVR 04/2019, RA, collagen vascular disorder, HTN, HLD, hypothyroidism, and GERD.  Clinical Impression  Pt admitted with above diagnosis. Pt was able to ambulate in room with min assist and cues. Does desaturate on RA therefore will need to monitor and assess if O2 will be needed on d/c. Instructed in incentive spirometer. Will follow acutely. Pt will benefit from post acute Rehab < 3 hours day.  Pt currently with functional limitations due to the deficits listed below (see PT Problem List). Pt will benefit from acute skilled PT to increase their independence and safety with mobility to allow discharge.           Assistance Recommended at Discharge Intermittent Supervision/Assistance  If plan is discharge home, recommend the following:  Can travel by private vehicle  A little help with bathing/dressing/bathroom;Assistance with cooking/housework;Assist for transportation;Help with stairs or ramp for entrance   No    Equipment Recommendations None recommended by PT  Recommendations for Other Services       Functional Status Assessment Patient has had a recent decline in their functional status and demonstrates the ability to make significant improvements in function in a reasonable and predictable amount of time.     Precautions / Restrictions Precautions Precautions: Fall Precaution Comments: SBP <140 Restrictions Weight Bearing Restrictions: No      Mobility  Bed Mobility Overal bed mobility: Needs Assistance Bed Mobility:  Supine to Sit, Sit to Supine     Supine to sit: Mod assist Sit to supine: Min assist   General bed mobility comments: assist for trunk elevation and returning BLE's into bed    Transfers Overall transfer level: Needs assistance Equipment used: Rolling walker (2 wheels) Transfers: Sit to/from Stand, Bed to chair/wheelchair/BSC Sit to Stand: Mod assist           General transfer comment: mod A from bed level surface, cues for hand placement with pt needing several standing attempts    Ambulation/Gait Ambulation/Gait assistance: Min assist Gait Distance (Feet): 60 Feet Assistive device: Rolling walker (2 wheels) Gait Pattern/deviations: Step-through pattern, Decreased stride length, Drifts right/left   Gait velocity interpretation: <1.31 ft/sec, indicative of household ambulator   General Gait Details: Pt able to ambulate with RW with fair balance overall. Desaturation on RA and needed 2LO2 to keep sats >88%.  Stairs            Wheelchair Mobility     Tilt Bed    Modified Rankin (Stroke Patients Only)       Balance Overall balance assessment: Needs assistance Sitting-balance support: Feet supported Sitting balance-Leahy Scale: Fair Sitting balance - Comments: can reach down toward feet without LOB   Standing balance support: During functional activity, Reliant on assistive device for balance Standing balance-Leahy Scale: Poor Standing balance comment: reliant on RW support                             Pertinent Vitals/Pain Pain Assessment Pain Assessment: No/denies pain    Home Living Family/patient expects to be discharged to:: Skilled nursing facility Living  Arrangements: Alone Available Help at Discharge: Family;Available PRN/intermittently (has an aid a few times a week 1 hr per day) Type of Home: Skilled Nursing Facility Home Access: Level entry       Home Layout: One level Home Equipment: Rollator (4 wheels);Grab bars -  toilet;Grab bars - tub/shower;Hand held shower head;Shower seat - built in;Hospital bed Additional Comments: SNF at [facility] since March 2024    Prior Function Prior Level of Function : Independent/Modified Independent             Mobility Comments: uses rollator at all times, pt walks at times by herself ADLs Comments: Dressess self, they help with bathing     Hand Dominance   Dominant Hand: Right    Extremity/Trunk Assessment   Upper Extremity Assessment Upper Extremity Assessment: Defer to OT evaluation    Lower Extremity Assessment Lower Extremity Assessment: Generalized weakness    Cervical / Trunk Assessment Cervical / Trunk Assessment: Kyphotic  Communication   Communication: No difficulties  Cognition Arousal/Alertness: Awake/alert Behavior During Therapy: Flat affect Overall Cognitive Status: History of cognitive impairments - at baseline                                 General Comments: hx of dementia, unaware of date        General Comments General comments (skin integrity, edema, etc.): Desaturate to 85% on RA and neededcues for pursed lip breathing. Issued incentive spirometer and education completed. Daughter in room.    Exercises     Assessment/Plan    PT Assessment Patient needs continued PT services  PT Problem List Decreased activity tolerance;Decreased balance;Decreased mobility;Decreased knowledge of use of DME;Decreased safety awareness;Decreased knowledge of precautions;Cardiopulmonary status limiting activity       PT Treatment Interventions DME instruction;Gait training;Functional mobility training;Therapeutic activities;Therapeutic exercise;Balance training;Patient/family education    PT Goals (Current goals can be found in the Care Plan section)  Acute Rehab PT Goals Patient Stated Goal: to go home PT Goal Formulation: With patient Time For Goal Achievement: 10/27/22 Potential to Achieve Goals: Good    Frequency  Min 1X/week     Co-evaluation               AM-PAC PT "6 Clicks" Mobility  Outcome Measure Help needed turning from your back to your side while in a flat bed without using bedrails?: A Lot Help needed moving from lying on your back to sitting on the side of a flat bed without using bedrails?: A Lot Help needed moving to and from a bed to a chair (including a wheelchair)?: A Lot Help needed standing up from a chair using your arms (e.g., wheelchair or bedside chair)?: A Lot Help needed to walk in hospital room?: A Little Help needed climbing 3-5 steps with a railing? : A Lot 6 Click Score: 13    End of Session Equipment Utilized During Treatment: Gait belt;Oxygen Activity Tolerance: Patient limited by fatigue Patient left: in bed;with call bell/phone within reach;with bed alarm set;with family/visitor present Nurse Communication: Mobility status PT Visit Diagnosis: Unsteadiness on feet (R26.81);Muscle weakness (generalized) (M62.81)    Time: 9562-1308 PT Time Calculation (min) (ACUTE ONLY): 32 min   Charges:   PT Evaluation $PT Eval Moderate Complexity: 1 Mod PT Treatments $Gait Training: 8-22 mins PT General Charges $$ ACUTE PT VISIT: 1 Visit         Chyler Creely M,PT Acute Rehab Services [phone]  Bevelyn Buckles 10/13/2022, 2:28 PM

## 2022-10-13 NOTE — Progress Notes (Signed)
Mobility Specialist Progress Note:   10/13/22 0915  Mobility  Activity Transferred from bed to chair  Level of Assistance Moderate assist, patient does 50-74%  Assistive Device Front wheel walker  Distance Ambulated (ft) 3 ft  Activity Response Tolerated well  Mobility Referral Yes  $Mobility charge 1 Mobility  Mobility Specialist Start Time (ACUTE ONLY) 0915  Mobility Specialist Stop Time (ACUTE ONLY) 0930  Mobility Specialist Time Calculation (min) (ACUTE ONLY) 15 min   Pt agreeable to mobility session. Required minA to get EOB, modA to stand and minA to take steps to chair. No c/o pain throughout. Pt left in chair with all needs met, chair alarm on.  Addison Lank Mobility Specialist Please contact via SecureChat or  Rehab office at [phone]

## 2022-10-13 NOTE — TOC Initial Note (Addendum)
Transition of Care [facility]) - Initial/Assessment Note    Patient Details  Name: Gloria [name] MRN: [id number] Date of Birth: July 19, 1925  [facility] of [facility]) CM/SW Contact:    Delilah Shan, LCSWA Phone Number: 10/13/2022, 3:49 PM  Clinical Narrative:                  CSW received consult for possible SNF placement at time of discharge. Due to patients current orientation CSW spoke with patients daughter Britta Mccreedy regarding PT recommendation of SNF placement at time of discharge. Britta Mccreedy reports PTA patient comes from [facility]. Patients daughter expressed understanding of PT recommendation and is agreeable for patient to receive rehab at [facility] when medically ready for dc. CSW discussed insurance authorization process.  No further questions reported at this time. Unable to locate in navi portal. CSW called patients insurance and started insurance authorization for patient. Reference # [id number]. CSW faxed requested clinicals to patients insurance for review.CSW  spoke with Sue Lush at [facility] who confirmed patient comes from there long term and can accept patient for short term rehab. Insurance authorization currently pending for SNF.CSW to continue to follow and assist with discharge planning needs.     Expected Discharge Plan: Skilled Nursing Facility Barriers to Discharge: Continued Medical Work up   Patient Goals and CMS Choice     Choice offered to / list presented to : Adult Children (daughter Britta Mccreedy)      Expected Discharge Plan and Services In-house Referral: Clinical Social Work                                            Prior Living Arrangements/Services     Patient language and need for interpreter reviewed:: Yes        Need for Family Participation in Patient Care: Yes (Comment) Care giver support system in place?: Yes (comment)   Criminal Activity/Legal Involvement Pertinent to Current Situation/Hospitalization: No - Comment as  needed  Activities of Daily Living      Permission Sought/Granted   Permission granted to share information with : No  Share Information with NAME: Due to patients current orientation CSW spoke with patients daughter Britta Mccreedy  Permission granted to share info w AGENCY: SNF  Permission granted to share info w Relationship: daughter  Permission granted to share info w Contact Information: Britta Mccreedy [phone]  Emotional Assessment       Orientation: : Oriented to Self, Oriented to Place Alcohol / Substance Use: Not Applicable Psych Involvement: No (comment)  Admission diagnosis:  AORTIC DISSECTION Patient Active Problem List   Diagnosis Date Noted   Hypokalemia 10/13/2022   Dissection of descending thoracic aorta (HCC) 10/10/2022   Other nonthrombocytopenic purpura (HCC) 06/07/2022   Urinary retention 06/05/2022   Malnutrition of moderate degree 06/04/2022   Acute respiratory failure with hypoxia (HCC) 06/03/2022   Recurrent UTI 06/03/2022   Weakness 06/03/2022   Leukocytosis 06/03/2022   Lung nodule seen on imaging study 08/12/2021   Pelvic fracture (HCC) 12/18/2020   Closed pelvic fracture (HCC) 12/16/2020   HLD (hyperlipidemia) 12/16/2020   Depression 12/16/2020   Migraines 02/05/2020   Recurrent major depressive disorder, in full remission (HCC) 12/31/2019   Closed displaced midcervical fracture of right femur (HCC) 12/03/2019   Status post hip hemiarthroplasty 12/03/2019   Closed right hip fracture (HCC) 11/30/2019   Hip fracture (HCC) 11/30/2019   History  of fracture of right hip 11/30/2019   Acute pain of right shoulder    Fall    Immunosuppression (HCC) 05/08/2019   Intramural hematoma of thoracic aorta (HCC) 04/08/2019   Thoracic ascending aortic aneurysm (HCC) 04/07/2019   Anemia 02/14/2018   Peripheral vascular disease (HCC) 09/14/2017   Essential hypertension 01/21/2014   Osteopenia 07/19/2013   Hypothyroidism 02/01/2007   GERD 02/01/2007   Rheumatoid  arthritis (HCC) 02/01/2007   Disorder of bone and cartilage 02/01/2007   COLONIC POLYPS, HX OF 02/01/2007   Polymyalgia rheumatica (HCC) 04/05/2001   PCP:  Earnestine Mealing, MD Pharmacy:   [facility] - [place], [place] - [address] [place] [address] Phone: [phone] Fax: [phone]  [id number] - [place], [place] - [address] AT [facility] OF [address] [place] [address] Phone: [phone] Fax: [phone]     Social Determinants of Health (SDOH) Social History: SDOH Screenings   Food Insecurity: No Food Insecurity (06/03/2022)  Housing: Low Risk  (06/03/2022)  Transportation Needs: No Transportation Needs (06/03/2022)  Utilities: Not At Risk (06/03/2022)  Tobacco Use: Low Risk  (10/09/2022)   SDOH Interventions:     Readmission Risk Interventions     No data to display

## 2022-10-13 NOTE — Progress Notes (Signed)
[facility] night cross cover note:   I was contacted by RN requesting clarification on parameters for existing order for as needed IV hydralazine.  Per brief chart review, including review of most recent hospitalist note and vascular surgery note, goal systolic blood pressure is noted to be less than 130 mmHg. I subsequently updated existing order for prn IV hydralazine to reflect the following: Hydralazine 10 mg IV every 3 hours as needed for SBP > 130 mmHg.   The patient also has an order for prn IV labetalol for antihypertensive purposes.  I also updated the  parameters for the prn IV labetalol to reflect indication as prn for SBP > 130 mmHg refractory to existing order for prn iv hydralazine.     Newton Pigg, DO Hospitalist

## 2022-10-14 DIAGNOSIS — I71012 Dissection of descending thoracic aorta: Secondary | ICD-10-CM | POA: Diagnosis not present

## 2022-10-14 LAB — BASIC METABOLIC PANEL
Anion gap: 7 (ref 5–15)
BUN: 20 mg/dL (ref 8–23)
CO2: 20 mmol/L — ABNORMAL LOW (ref 22–32)
Calcium: 8.3 mg/dL — ABNORMAL LOW (ref 8.9–10.3)
Chloride: 105 mmol/L (ref 98–111)
Creatinine, Ser: 0.68 mg/dL (ref 0.44–1.00)
GFR, Estimated: >60 mL/min (ref >60)
Glucose, Bld: 108 mg/dL — ABNORMAL HIGH (ref 70–99)
Potassium: 3.9 mmol/L (ref 3.5–5.1)
Sodium: 132 mmol/L — ABNORMAL LOW (ref 135–145)

## 2022-10-14 LAB — MAGNESIUM: Magnesium: 2.3 mg/dL (ref 1.7–2.4)

## 2022-10-14 MED ORDER — POLYVINYL ALCOHOL 1.4 % OP SOLN
1.0000 [drp] | Freq: Three times a day (TID) | OPHTHALMIC | Status: DC
  Administered 2022-10-14 – 2022-10-15 (×4): 1 [drp] via OPHTHALMIC
  Filled 2022-10-14 (×2): qty 15

## 2022-10-14 MED ORDER — LOSARTAN POTASSIUM 50 MG PO TABS
100.0000 mg | ORAL_TABLET | Freq: Every day | ORAL | Status: DC
  Administered 2022-10-14 – 2022-10-15 (×2): 100 mg via ORAL
  Filled 2022-10-14 (×2): qty 2

## 2022-10-14 NOTE — Progress Notes (Addendum)
  Progress Note    10/14/2022 7:58 AM * No surgery found *  Subjective:  sitting up in bed.  Disoriented this morning   Vitals:   10/14/22 0200 10/14/22 0300  BP: (!) 101/45 (!) 104/53  Pulse:  65  Resp:  17  Temp:  98.2 F (36.8 C)  SpO2:  92%   Physical Exam: Lungs:  non labored Extremities:  palpable PT pulses L better than R; symmetrical radial pulses Abdomen:  soft, NT, ND Neurologic: alert but not oriented  CBC    Component Value Date/Time   WBC 13.9 (H) 10/12/2022 0127   RBC 3.54 (L) 10/12/2022 0127   HGB 10.4 (L) 10/12/2022 0127   HCT 32.6 (L) 10/12/2022 0127   PLT 160 10/12/2022 0127   MCV 92.1 10/12/2022 0127   MCH 29.4 10/12/2022 0127   MCHC 31.9 10/12/2022 0127   RDW 18.1 (H) 10/12/2022 0127   LYMPHSABS 0.8 06/03/2022 1117   MONOABS 0.9 06/03/2022 1117   EOSABS 1.2 (H) 06/03/2022 1117   BASOSABS 0.1 06/03/2022 1117    BMET    Component Value Date/Time   NA 132 (L) 10/14/2022 0157   NA 140 09/13/2022 0000   K 3.9 10/14/2022 0157   CL 105 10/14/2022 0157   CO2 20 (L) 10/14/2022 0157   GLUCOSE 108 (H) 10/14/2022 0157   BUN 20 10/14/2022 0157   BUN 11 09/13/2022 0000   CREATININE 0.68 10/14/2022 0157   CALCIUM 8.3 (L) 10/14/2022 0157   GFRNONAA >60 10/14/2022 0157   GFRAA >60 12/03/2019 0430    INR    Component Value Date/Time   INR 1.0 12/16/2020 2142     Intake/Output Summary (Last 24 hours) at 10/14/2022 0758 Last data filed at 10/13/2022 1847 Gross per 24 hour  Intake 275 ml  Output --  Net 275 ml     Assessment/Plan:  89 y.o. female with  hx of stent graft of her thoracic aorta for symptomatic intramural hematoma. This was performed in 2021 with Gore C tag 31 x 20 and 31 x 10 cm device by Dr. Myra Gianotti and presented to the hospital with chest pain and found to have descending thoracic dissection distal to her previous stent graft admitted to ICU for BP control   No abd or back pain.  Moving all ext well and peripheral pulses  intact.  Continue impulse control with goal 130 systolic pressure.  Repeat CTA in 1 month   Emilie Rutter, [facility] [phone] 10/14/2022 7:58 AM

## 2022-10-14 NOTE — Progress Notes (Signed)
PROGRESS NOTE    Gloria [name]  WUJ:[id number] DOB: 1925-08-25 DOA: 10/10/2022 PCP: Earnestine Mealing, MD     Brief Narrative:  Gloria [name] is a 89 y/o F with PMH significant for collagen vascular disease with ascending thoracic aneurysm s/p stent, RA on MTX, dementia, HTN, squamous and basal cell carcinoma who presented with chest pain and upper back pain.   She was hypertensive in the ED and CTA showed early thoracic aortic dissection or penetrating ulcer resulting in acute Type B intramural hematoma. She was started on cleviprex and transferred to [facility] for further evaluation.  Vascular surgery consulted.  Patient's blood pressure improved and she was weaned off Cleviprex.  She was transferred to [facility] service 7/10.  New events last 24 hours / Subjective: Patient without any complaints.  Daughter is at bedside, we discussed the need for blood pressure control at her long-term care facility as well as close follow-up with vascular surgery.  Daughter states that it would be very difficult to get patient to [facility] after discharge.  Wondering if she can follow-up with vascular surgery service with repeat CTA close to [facility].   Assessment & Plan:   Principal Problem:   Dissection of descending thoracic aorta (HCC) Active Problems:   Hypothyroidism   Rheumatoid arthritis (HCC)   Hypokalemia   Type B aortic dissection Hypertensive emergency -Now off Cleviprex -Coreg, losartan, Norvasc  -Vascular surgery following, SBP goal <130 -Follow-up outpatient for repeat CTA in 1 month  History of rheumatoid arthritis -Holding methotrexate for now, continue daily prednisone  Hypothyroidism -Synthroid   DVT prophylaxis:  enoxaparin (LOVENOX) injection 40 mg Start: 10/11/22 1300 Place and maintain sequential compression device Start: 10/11/22 0744  Code Status: DNR Family Communication: Daughter at bedside Disposition Plan: Await insurance authorization for SNF Status is:  Inpatient Remains inpatient appropriate because: Insurance authorization pending for SNF   Antimicrobials:  Anti-infectives (From admission, onward)    None        Objective: Vitals:   10/14/22 0100 10/14/22 0200 10/14/22 0300 10/14/22 0801  BP: (!) 117/50 (!) 101/45 (!) 104/53 (!) 141/63  Pulse:   65 64  Resp:   17 20  Temp:   98.2 F (36.8 C) 98.2 F (36.8 C)  TempSrc:   Oral Oral  SpO2:   92% 96%  Weight:   64 kg     Intake/Output Summary (Last 24 hours) at 10/14/2022 1251 Last data filed at 10/13/2022 1847 Gross per 24 hour  Intake 275 ml  Output --  Net 275 ml   Filed Weights   10/10/22 0045 10/13/22 0357 10/14/22 0300  Weight: 63.3 kg 63 kg 64 kg    Examination:  General exam: Appears calm and comfortable  Respiratory system: Clear to auscultation. Respiratory effort normal. No respiratory distress. No conversational dyspnea.  Cardiovascular system: S1 & S2 heard, RRR. + Systolic murmur. No pedal edema. Gastrointestinal system: Abdomen is nondistended, soft and nontender. Normal bowel sounds heard. Central nervous system: Alert and oriented. No focal neurological deficits. Speech clear.  Extremities: Symmetric in appearance  Skin: No rashes, lesions or ulcers on exposed skin  Psychiatry: Judgement and insight appear normal. Mood & affect appropriate.   Data Reviewed: I have personally reviewed following labs and imaging studies  CBC: Recent Labs  Lab 10/09/22 1852 10/10/22 0650 10/11/22 0737 10/12/22 0127  WBC 9.2 13.4* 14.2* 13.9*  HGB 11.1* 10.6* 10.5* 10.4*  HCT 34.8* 32.2* 32.2* 32.6*  MCV 90.6 87.7 88.7 92.1  PLT 226  201 179 160   Basic Metabolic Panel: Recent Labs  Lab 10/10/22 0650 10/10/22 1215 10/11/22 0213 10/12/22 0127 10/13/22 0132 10/14/22 0157  NA 134*  --  135 133* 131* 132*  K 3.0*  --  3.7 3.9 3.2* 3.9  CL 100  --  104 104 104 105  CO2 25  --  24 20* 20* 20*  GLUCOSE 119*  --  112* 104* 106* 108*  BUN 10  --  10 18 20  20   CREATININE 0.63  --  0.60 0.86 0.74 0.68  CALCIUM 8.2*  --  8.3* 8.2* 8.1* 8.3*  MG  --  2.0  --  1.9 2.6* 2.3  PHOS  --   --   --  3.3  --   --    GFR: Estimated Creatinine Clearance: 40.9 mL/min (by C-G formula based on SCr of 0.68 mg/dL). Liver Function Tests: No results for input(s): "AST", "ALT", "ALKPHOS", "BILITOT", "PROT", "ALBUMIN" in the last 168 hours. No results for input(s): "LIPASE", "AMYLASE" in the last 168 hours. No results for input(s): "AMMONIA" in the last 168 hours. Coagulation Profile: No results for input(s): "INR", "PROTIME" in the last 168 hours. Cardiac Enzymes: No results for input(s): "CKTOTAL", "CKMB", "CKMBINDEX", "TROPONINI" in the last 168 hours. BNP (last 3 results) No results for input(s): "PROBNP" in the last 8760 hours. HbA1C: No results for input(s): "HGBA1C" in the last 72 hours. CBG: No results for input(s): "GLUCAP" in the last 168 hours. Lipid Profile: Recent Labs    10/12/22 0128  TRIG 55   Thyroid Function Tests: No results for input(s): "TSH", "T4TOTAL", "FREET4", "T3FREE", "THYROIDAB" in the last 72 hours. Anemia Panel: No results for input(s): "VITAMINB12", "FOLATE", "FERRITIN", "TIBC", "IRON", "RETICCTPCT" in the last 72 hours. Sepsis Labs: No results for input(s): "PROCALCITON", "LATICACIDVEN" in the last 168 hours.  Recent Results (from the past 240 hour(s))  MRSA Next Gen by PCR, Nasal     Status: None   Collection Time: 10/10/22 12:44 AM   Specimen: Nasal Mucosa; Nasal Swab  Result Value Ref Range Status   MRSA by PCR Next Gen NOT DETECTED NOT DETECTED Final    Comment: (NOTE) The GeneXpert MRSA Assay (FDA approved for NASAL specimens only), is one component of a comprehensive MRSA colonization surveillance program. It is not intended to diagnose MRSA infection nor to guide or monitor treatment for MRSA infections. Test performance is not FDA approved in patients less than 76 years old. Performed at [facility], [address] [place], [place] [address]       Radiology Studies: No results found.    Scheduled Meds:  amLODipine  10 mg Oral Daily   atorvastatin  20 mg Oral Daily   carvedilol  12.5 mg Oral BID WC   Chlorhexidine Gluconate Cloth  6 each Topical Daily   enoxaparin (LOVENOX) injection  40 mg Subcutaneous Daily   feeding supplement  237 mL Oral BID BM   levothyroxine  75 mcg Oral Q0600   losartan  100 mg Oral Daily   polyvinyl alcohol  1 drop Both Eyes TID AC   predniSONE  2.5 mg Oral Q breakfast   sertraline  50 mg Oral QHS   Continuous Infusions:   LOS: 4 days   Time spent: 25 minutes   Noralee Stain, DO [facility] 10/14/2022, 12:51 PM   Available via Epic secure chat 7am-7pm After these hours, please refer to coverage provider listed on [email or web address]

## 2022-10-14 NOTE — Progress Notes (Signed)
Occupational Therapy Treatment Patient Details Name: Gloria [name] MRN: [id number] DOB: [birth year] Today's Date: 10/14/2022   History of present illness Pt is a 89 y/o F presenting to ED on 7/7 with sudden chest and back pain, found new descending aortic dissection and increased ascending aortic aneurysm 4.4cm and hypertensive urgency. Started on cleviprex. Plan for non-operative mgmt with BP control. PMH includes dementia, ascending thoracic aneurysm s/p TAVR 04/2019, RA, collagen vascular disorder, HTN, HLD, hypothyroidism, and GERD.   OT comments  With encouragement, pt sat EOB and drank water. Stood x 2 with RW and moderate assistance from elevated bed. Pt requiring max assist for bed mobility. VSS. Son and daughter participating in session. Patient will benefit from continued inpatient follow up therapy, <3 hours/day    Recommendations for follow up therapy are one component of a multi-disciplinary discharge planning process, led by the attending physician.  Recommendations may be updated based on patient status, additional functional criteria and insurance authorization.    Assistance Recommended at Discharge Frequent or constant Supervision/Assistance  Patient can return home with the following  A lot of help with walking and/or transfers;A lot of help with bathing/dressing/bathroom;Direct supervision/assist for medications management;Direct supervision/assist for financial management;Assist for transportation;Help with stairs or ramp for entrance;Assistance with cooking/housework   Equipment Recommendations  Other (comment) (defer to next venue)    Recommendations for Other Services      Precautions / Restrictions Precautions Precautions: Fall       Mobility Bed Mobility Overal bed mobility: Needs Assistance Bed Mobility: Supine to Sit, Sit to Supine     Supine to sit: Max assist Sit to supine: Max assist   General bed mobility comments: assist for all aspects, +2 to  pull up in bed at end of session    Transfers Overall transfer level: Needs assistance Equipment used: Rolling walker (2 wheels) Transfers: Sit to/from Stand Sit to Stand: Mod assist, From elevated surface           General transfer comment: heavy mod x 2     Balance Overall balance assessment: Needs assistance   Sitting balance-Leahy Scale: Fair Sitting balance - Comments: min guard     Standing balance-Leahy Scale: Poor Standing balance comment: reliant on RW and min assist                           ADL either performed or assessed with clinical judgement   ADL Overall ADL's : Needs assistance/impaired Eating/Feeding: Set up;Sitting Eating/Feeding Details (indicate cue type and reason): drank with straw                                        Extremity/Trunk Assessment              Vision       Perception     Praxis      Cognition Arousal/Alertness: Awake/alert Behavior During Therapy: Flat affect Overall Cognitive Status: History of cognitive impairments - at baseline                                          Exercises      Shoulder Instructions       General Comments      Pertinent Vitals/ Pain  Pain Assessment Pain Assessment: Faces Faces Pain Scale: Hurts even more Pain Location: knees Pain Descriptors / Indicators: Grimacing, Moaning Pain Intervention(s): Monitored during session, Repositioned  Home Living                                          Prior Functioning/Environment              Frequency  Min 1X/week        Progress Toward Goals  OT Goals(current goals can now be found in the care plan section)  Progress towards OT goals: Progressing toward goals  Acute Rehab OT Goals OT Goal Formulation: With patient Time For Goal Achievement: 10/26/22 Potential to Achieve Goals: Good  Plan Discharge plan remains appropriate    Co-evaluation                  AM-PAC OT "6 Clicks" Daily Activity     Outcome Measure   Help from another person eating meals?: A Little Help from another person taking care of personal grooming?: A Little Help from another person toileting, which includes using toliet, bedpan, or urinal?: A Lot Help from another person bathing (including washing, rinsing, drying)?: A Lot Help from another person to put on and taking off regular upper body clothing?: A Lot Help from another person to put on and taking off regular lower body clothing?: A Lot 6 Click Score: 14    End of Session Equipment Utilized During Treatment: Rolling walker (2 wheels)  OT Visit Diagnosis: Unsteadiness on feet (R26.81);Other abnormalities of gait and mobility (R26.89);Muscle weakness (generalized) (M62.81);History of falling (Z91.81)   Activity Tolerance Patient tolerated treatment well   Patient Left in bed;with call bell/phone within reach;with bed alarm set;with family/visitor present   Nurse Communication          Time: 1610-9604 OT Time Calculation (min): 26 min  Charges: OT General Charges $OT Visit: 1 Visit OT Treatments $Therapeutic Activity: 23-37 mins  Berna Spare, OTR/L Acute Rehabilitation Services Office: [phone]   Evern Bio 10/14/2022, 3:50 PM

## 2022-10-14 NOTE — TOC Progression Note (Addendum)
Transition of Care [facility]) - Progression Note    Patient Details  Name: Gloria [name] MRN: [id number] Date of Birth: Aug 07, 1925  [facility] of [facility] CM/SW Contact  Dellie Burns [place], [place] Phone Number: 10/14/2022, 10:35 AM  Clinical Narrative: Ethlyn Gallery remains pending at this time. Spoke to Sue Lush at [facility] who reports they are able to accept pt into rehab private pay while Berkley Harvey is pending and if approved, then will switch to medicare coverage. Updated pt's dtr Britta Mccreedy who prefers to wait on [employer] determination prior to dc. Hopeful for determination today or tomorrow. MD updated.   UPDATE 1500: nav[employer] auth received: [id number], valid 7/11-7/15. Confirmed with Sue Lush at [facility], they are prepared to admit pt tomorrow. MD and dtr updated.   Dellie Burns, MSW, LCSW [phone] (coverage)        Expected Discharge Plan: Skilled Nursing Facility Barriers to Discharge: Continued Medical Work up  Expected Discharge Plan and Services In-house Referral: Clinical Social Work                                             Social Determinants of Health (SDOH) Interventions SDOH Screenings   Food Insecurity: No Food Insecurity (06/03/2022)  Housing: Low Risk  (06/03/2022)  Transportation Needs: No Transportation Needs (06/03/2022)  Utilities: Not At Risk (06/03/2022)  Tobacco Use: Low Risk  (10/09/2022)    Readmission Risk Interventions     No data to display

## 2022-10-15 DIAGNOSIS — I71012 Dissection of descending thoracic aorta: Secondary | ICD-10-CM | POA: Diagnosis not present

## 2022-10-15 LAB — CBC
HCT: 31.3 % — ABNORMAL LOW (ref 36.0–46.0)
Hemoglobin: 10.2 g/dL — ABNORMAL LOW (ref 12.0–15.0)
MCH: 28.4 pg (ref 26.0–34.0)
MCHC: 32.6 g/dL (ref 30.0–36.0)
MCV: 87.2 fL (ref 80.0–100.0)
Platelets: 191 10*3/uL (ref 150–400)
RBC: 3.59 MIL/uL — ABNORMAL LOW (ref 3.87–5.11)
RDW: 18 % — ABNORMAL HIGH (ref 11.5–15.5)
WBC: 14.3 10*3/uL — ABNORMAL HIGH (ref 4.0–10.5)
nRBC: 0 % (ref 0.0–0.2)

## 2022-10-15 LAB — BASIC METABOLIC PANEL
Anion gap: 7 (ref 5–15)
BUN: 17 mg/dL (ref 8–23)
CO2: 21 mmol/L — ABNORMAL LOW (ref 22–32)
Calcium: 8.2 mg/dL — ABNORMAL LOW (ref 8.9–10.3)
Chloride: 106 mmol/L (ref 98–111)
Creatinine, Ser: 0.66 mg/dL (ref 0.44–1.00)
GFR, Estimated: >60 mL/min (ref >60)
Glucose, Bld: 112 mg/dL — ABNORMAL HIGH (ref 70–99)
Potassium: 3.9 mmol/L (ref 3.5–5.1)
Sodium: 134 mmol/L — ABNORMAL LOW (ref 135–145)

## 2022-10-15 MED ORDER — HYDRALAZINE HCL 10 MG PO TABS: 10.0000 mg | ORAL_TABLET | Freq: Every day | ORAL | 0 refills | Status: DC | PRN

## 2022-10-15 MED ORDER — CARVEDILOL 12.5 MG PO TABS: 12.5000 mg | ORAL_TABLET | Freq: Two times a day (BID) | ORAL | 1 refills | Status: DC

## 2022-10-15 MED ORDER — LOSARTAN POTASSIUM 100 MG PO TABS: 100.0000 mg | ORAL_TABLET | Freq: Every day | ORAL | 1 refills | Status: AC

## 2022-10-15 MED ORDER — AMLODIPINE BESYLATE 10 MG PO TABS: 10.0000 mg | ORAL_TABLET | Freq: Every day | ORAL | 1 refills | Status: AC

## 2022-10-15 MED ORDER — METHOTREXATE 2.5 MG PO TABS
15.0000 mg | ORAL_TABLET | ORAL | Status: DC
  Filled 2022-10-15: qty 6

## 2022-10-15 NOTE — Discharge Summary (Signed)
Physician Discharge Summary  Gloria [name] PPI:[id number] DOB: 03-30-1926 DOA: 10/10/2022  PCP: Earnestine Mealing, MD  Admit date: 10/10/2022 Discharge date: 10/15/2022  Admitted From: LTC Disposition:  SNF   Recommendations for Outpatient Follow-up:  Follow up with PCP in 1 week Follow up with vascular surgery in 1 month for repeat CTA Goal SBP <130.  Recommend to check blood pressure at least 3 times daily.  Can give hydralazine as needed.  Discharge Condition: Stable CODE STATUS: DNR Diet recommendation: Regular diet  Brief/Interim Summary: Gloria [name] is a 89 y/o F with PMH significant for collagen vascular disease with ascending thoracic aneurysm s/p stent, RA on MTX, dementia, HTN, squamous and basal cell carcinoma who presented with chest pain and upper back pain.   She was hypertensive in the ED and CTA showed early thoracic aortic dissection or penetrating ulcer resulting in acute Type B intramural hematoma. She was started on cleviprex and transferred to [facility] for further evaluation.  Vascular surgery consulted.  Patient's blood pressure improved and she was weaned off Cleviprex.  She was transferred to [facility] service 7/10.  Patient continued to improve and blood pressure was fairly well-controlled with oral antihypertensives.  She worked with physical therapy and was recommended for SNF placement.  Discharge Diagnoses:   Principal Problem:   Dissection of descending thoracic aorta (HCC) Active Problems:   Hypothyroidism   Rheumatoid arthritis (HCC)   Hypokalemia  Type B aortic dissection Hypertensive emergency -Now off Cleviprex -Coreg, losartan, Norvasc  -Vascular surgery following, SBP goal <130.  Can give hydralazine 10 mg as needed -Follow-up outpatient for repeat CTA in 1 month   History of rheumatoid arthritis -Methotrexate, prednisone   Hypothyroidism -Synthroid    Discharge Instructions  Discharge Instructions     Diet general   Complete by: As  directed    Increase activity slowly   Complete by: As directed       Allergies as of 10/15/2022       Reactions   Amoxicillin Other (See Comments)   Intolerant Daughter and Son report patient tolerated course of Augmentin in January 2024   Codeine Nausea Only   Ibandronate Sodium  [ibandronic Acid] Other (See Comments)   Gi upset   Risedronate Other (See Comments)   Gi upset   Sulfa Antibiotics Hives        Medication List     TAKE these medications    acetaminophen 325 MG tablet Commonly known as: TYLENOL Take 2 tablets (650 mg total) by mouth every 4 (four) hours as needed for moderate pain.   amLODipine 10 MG tablet Commonly known as: NORVASC Take 1 tablet (10 mg total) by mouth daily. Start taking on: October 16, 2022   atorvastatin 20 MG tablet Commonly known as: LIPITOR Take 20 mg by mouth daily.   CALCIUM 600 PO Take 600 mg by mouth in the morning and at bedtime.   carvedilol 12.5 MG tablet Commonly known as: COREG Take 1 tablet (12.5 mg total) by mouth 2 (two) times daily with a meal. What changed:  medication strength how much to take   folic acid 1 MG tablet Commonly known as: FOLVITE Take 1 mg by mouth daily.   hydrALAZINE 10 MG tablet Commonly known as: APRESOLINE Take 1 tablet (10 mg total) by mouth daily as needed (for SBP > 130).   ketoconazole 2 % shampoo Commonly known as: NIZORAL Apply 1 Application topically 2 (two) times a week. Wednesday and Saturday.   levothyroxine 75  MCG tablet Commonly known as: SYNTHROID Take 75 mcg by mouth at bedtime.   losartan 100 MG tablet Commonly known as: COZAAR Take 1 tablet (100 mg total) by mouth daily. Start taking on: October 16, 2022 What changed:  medication strength how much to take   methotrexate 2.5 MG tablet Commonly known as: RHEUMATREX Take 15 mg by mouth every Friday.   multivitamin with minerals Tabs tablet Take 1 tablet by mouth daily.   nitrofurantoin  (macrocrystal-monohydrate) 100 MG capsule Commonly known as: MACROBID Take 1 capsule (100 mg total) by mouth daily.   predniSONE 2.5 MG tablet Commonly known as: DELTASONE Take 2.5 mg by mouth daily.   senna 8.6 MG Tabs tablet Commonly known as: SENOKOT Take 1 tablet by mouth daily.   sertraline 50 MG tablet Commonly known as: ZOLOFT Take 50 mg by mouth at bedtime.   SYSTANE BALANCE OP Place 1 drop into both eyes in the morning, at noon, and at bedtime. PRN order: Instill one drop in both eyes every 12 hours as needed for dry eyes.   triamcinolone cream 0.1 % Commonly known as: KENALOG Apply 1 Application topically as directed. Apply to trunk, arms and legs 3 days a week Monday, Wednesday, Friday, for atopic dermatitis and stasis dermatitis, avoid face, groin, axilla        Follow-up Information     Earnestine Mealing, MD Follow up.   Specialty: Family Medicine Contact information: [address] [place] [address] [phone]         Nada Libman, MD. Schedule an appointment as soon as possible for a visit in 1 month(s).   Specialties: Vascular Surgery, Cardiology Contact information: [address] [place] [address] [phone]                Allergies  Allergen Reactions   Amoxicillin Other (See Comments)    Intolerant  Daughter and Son report patient tolerated course of Augmentin in January 2024   Codeine Nausea Only   Ibandronate Sodium  [Ibandronic Acid] Other (See Comments)    Gi upset   Risedronate Other (See Comments)    Gi upset   Sulfa Antibiotics Hives    Consultations: Vascular surgery PCCM    Procedures/Studies: CT Angio Chest/Abd/Pel for Dissection W and/or W/WO  Result Date: 10/12/2022 CLINICAL DATA:  Thoracic aortic disease. EXAM: CT ANGIOGRAPHY CHEST, ABDOMEN AND PELVIS TECHNIQUE: Non-contrast CT of the chest was initially obtained. Multidetector CT imaging through the chest, abdomen and pelvis was performed using the  standard protocol during bolus administration of intravenous contrast. Multiplanar reconstructed images and MIPs were obtained and reviewed to evaluate the vascular anatomy. RADIATION DOSE REDUCTION: This exam was performed according to the departmental dose-optimization program which includes automated exposure control, adjustment of the mA and/or kV according to patient size and/or use of iterative reconstruction technique. CONTRAST:  75mL OMNIPAQUE IOHEXOL 350 MG/ML SOLN COMPARISON:  October 09, 2022. FINDINGS: CTA CHEST FINDINGS Cardiovascular: Grossly stable 4.6 cm ascending thoracic aortic aneurysm based on my own measurement of prior exam. Great vessels are widely patent. Stent graft is seen involving transverse aortic arch extending into the descending thoracic aorta. There is again noted a dissection involving the descending thoracic aorta, with contrast present from the midportion into the distal portion that is not covered with stent graft. This is grossly unchanged compared to prior exam. This dissection extends slightly into the proximal abdominal aorta. Mild cardiomegaly. No pericardial effusion. Mediastinum/Nodes: No enlarged mediastinal, hilar, or axillary lymph nodes.  Thyroid gland, trachea, and esophagus demonstrate no significant findings. Lungs/Pleura: No pneumothorax is noted. Small to moderate-sized left pleural effusion is noted with associated atelectasis of left lower lobe. Minimal right pleural effusion is noted with associated minimal sub some atelectasis. Probable scarring is noted in right upper lobe. Musculoskeletal: No chest wall abnormality. No acute or significant osseous findings. Review of the MIP images confirms the above findings. CTA ABDOMEN AND PELVIS FINDINGS VASCULAR Aorta: Atherosclerosis of abdominal aorta is noted without aneurysm formation. As noted above, there is noted a dissection extending from descending thoracic aorta into proximal suprarenal abdominal aorta. Celiac:  Severe narrowing is noted with poststenotic dilatation, but no thrombus is noted. SMA: Patent without evidence of aneurysm, dissection, vasculitis or significant stenosis. Renals: Both renal arteries are patent without evidence of aneurysm, dissection, vasculitis, fibromuscular dysplasia or significant stenosis. IMA: Patent without evidence of aneurysm, dissection, vasculitis or significant stenosis. Inflow: Patent without evidence of aneurysm, dissection, vasculitis or significant stenosis. Veins: No obvious venous abnormality within the limitations of this arterial phase study. Review of the MIP images confirms the above findings. NON-VASCULAR Hepatobiliary: Minimal cholelithiasis is noted. No biliary dilatation is noted. Liver is unremarkable. Pancreas: Unremarkable. No pancreatic ductal dilatation or surrounding inflammatory changes. Spleen: Normal in size without focal abnormality. Adrenals/Urinary Tract: Adrenal glands are unremarkable. Kidneys are normal, without renal calculi, focal lesion, or hydronephrosis. Bladder is unremarkable. Stomach/Bowel: Stomach is unremarkable. There is no evidence of bowel obstruction or inflammation. Lymphatic: No adenopathy is noted. Reproductive: Status post hysterectomy. No adnexal masses. Other: No abdominal wall hernia or abnormality. No abdominopelvic ascites. Musculoskeletal: Status post right hip arthroplasty. Status post bilateral sacroplasty. No acute osseous abnormalities noted. Review of the MIP images confirms the above findings. IMPRESSION: Grossly stable 4.6 cm ascending thoracic aortic aneurysm compared to prior exam of October 09, 2022. Recommend semi-annual imaging followup by CTA or MRA and referral to cardiothoracic surgery if not already obtained. This recommendation follows 2010 ACCF/AHA/AATS/ACR/ASA/SCA/SCAI/SIR/STS/SVM Guidelines for the Diagnosis and Management of Patients With Thoracic Aortic Disease. Circulation. 2010; 121: V956-L875. Aortic aneurysm NOS  (ICD10-I71.9). Stable appearance of stent graft involving transverse aortic arch and descending thoracic aorta. Grossly stable dissection seen involving the descending thoracic aorta which extends into the suprarenal abdominal aorta. Aneurysmal dilatation of descending thoracic aorta is noted at 4 cm. 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 Bilateral pleural effusions are again noted, left greater than right, with associated atelectasis of both lower lobes. Minimal cholelithiasis. Aortic Atherosclerosis (ICD10-I70.0). Electronically Signed   By: Lupita Raider M.D.   On: 10/12/2022 10:40   ECHOCARDIOGRAM COMPLETE  Result Date: 10/11/2022    ECHOCARDIOGRAM REPORT   Patient Name:   [facility] Date of Exam: 10/11/2022 Medical Rec #:  [id number]         Height:       67.0 in Accession #:    [id number]        Weight:       139.6 lb Date of Birth:  08/19/1925         BSA:          1.735 m Patient Age:    89 years          BP:           132/58 mmHg Patient Gender: F                 HR:  74 bpm. Exam Location:  Inpatient Procedure: 2D Echo, Cardiac Doppler and Color Doppler Indications:    Dissection of thoracic aorta  History:        Patient has prior history of Echocardiogram examinations, most                 recent 04/09/2019. Risk Factors:Hypertension.  Sonographer:    Raeford Razor Referring Phys: [id number] Aliene Beams  Sonographer Comments: Image acquisition challenging due to patient body habitus. IMPRESSIONS  1. Left ventricular ejection fraction, by estimation, is 60 to 65%. The left ventricle has normal function. The left ventricle has no regional wall motion abnormalities. There is mild concentric left ventricular hypertrophy. Left ventricular diastolic parameters are consistent with Grade I diastolic dysfunction (impaired relaxation).  2. Right ventricular systolic function is normal. The right ventricular size is normal.  There is mildly elevated pulmonary artery systolic pressure. The estimated right ventricular systolic pressure is 40.9 mmHg.  3. Left atrial size was moderately dilated.  4. The aortic valve is tricuspid. There is moderate calcification of the aortic valve. Aortic valve regurgitation is not visualized. Mild aortic valve stenosis. Aortic valve area, by VTI measures 1.91 cm. Aortic valve mean gradient measures 17.0 mmHg.  5. The mitral valve is normal in structure. Trivial mitral valve regurgitation. No evidence of mitral stenosis.  6. The inferior vena cava is normal in size with greater than 50% respiratory variability, suggesting right atrial pressure of 3 mmHg.  7. Aortic dilatation noted. There is mild dilatation of the ascending aorta, measuring 42 mm (incompletely visualized).  8. A small pericardial effusion is present. FINDINGS  Left Ventricle: Left ventricular ejection fraction, by estimation, is 60 to 65%. The left ventricle has normal function. The left ventricle has no regional wall motion abnormalities. The left ventricular internal cavity size was normal in size. There is  mild concentric left ventricular hypertrophy. Left ventricular diastolic parameters are consistent with Grade I diastolic dysfunction (impaired relaxation). Right Ventricle: The right ventricular size is normal. No increase in right ventricular wall thickness. Right ventricular systolic function is normal. There is mildly elevated pulmonary artery systolic pressure. The tricuspid regurgitant velocity is 3.08  m/s, and with an assumed right atrial pressure of 3 mmHg, the estimated right ventricular systolic pressure is 40.9 mmHg. Left Atrium: Left atrial size was moderately dilated. Right Atrium: Right atrial size was normal in size. Pericardium: A small pericardial effusion is present. Mitral Valve: The mitral valve is normal in structure. Mild mitral annular calcification. Trivial mitral valve regurgitation. No evidence of mitral  valve stenosis. Tricuspid Valve: The tricuspid valve is normal in structure. Tricuspid valve regurgitation is mild. Aortic Valve: The aortic valve is tricuspid. There is moderate calcification of the aortic valve. Aortic valve regurgitation is not visualized. Mild aortic stenosis is present. Aortic valve mean gradient measures 17.0 mmHg. Aortic valve peak gradient measures 24.8 mmHg. Aortic valve area, by VTI measures 1.91 cm. Pulmonic Valve: The pulmonic valve was normal in structure. Pulmonic valve regurgitation is trivial. Aorta: The aortic root is normal in size and structure and aortic dilatation noted. There is mild dilatation of the ascending aorta, measuring 42 mm. Venous: The inferior vena cava is normal in size with greater than 50% respiratory variability, suggesting right atrial pressure of 3 mmHg. IAS/Shunts: No atrial level shunt detected by color flow Doppler.  LEFT VENTRICLE PLAX 2D LVIDd:         3.90 cm   Diastology LVIDs:  2.60 cm   LV e' medial:    6.31 cm/s LV PW:         1.30 cm   LV E/e' medial:  12.4 LV IVS:        1.30 cm   LV e' lateral:   8.92 cm/s LVOT diam:     1.90 cm   LV E/e' lateral: 8.8 LV SV:         101 LV SV Index:   58 LVOT Area:     2.84 cm  RIGHT VENTRICLE             IVC RV S prime:     14.00 cm/s  IVC diam: 1.90 cm TAPSE (M-mode): 2.6 cm LEFT ATRIUM             Index        RIGHT ATRIUM           Index LA diam:        5.20 cm 3.00 cm/m   RA Area:     16.30 cm LA Vol (A2C):   85.3 ml 49.15 ml/m  RA Volume:   38.10 ml  21.95 ml/m LA Vol (A4C):   62.6 ml 36.07 ml/m LA Biplane Vol: 73.9 ml 42.58 ml/m  AORTIC VALVE AV Area (Vmax):    1.82 cm AV Area (Vmean):   1.71 cm AV Area (VTI):     1.91 cm AV Vmax:           249.00 cm/s AV Vmean:          185.500 cm/s AV VTI:            0.528 m AV Peak Grad:      24.8 mmHg AV Mean Grad:      17.0 mmHg LVOT Vmax:         160.00 cm/s LVOT Vmean:        112.000 cm/s LVOT VTI:          0.355 m LVOT/AV VTI ratio: 0.67  AORTA Ao  Root diam: 3.20 cm Ao Asc diam:  4.20 cm MITRAL VALVE                TRICUSPID VALVE MV Area (PHT): 3.85 cm     TR Peak grad:   37.9 mmHg MV Decel Time: 197 msec     TR Vmax:        308.00 cm/s MV E velocity: 78.10 cm/s MV A velocity: 122.00 cm/s  SHUNTS MV E/A ratio:  0.64         Systemic VTI:  0.36 m                             Systemic Diam: 1.90 cm Dalton McleanMD Electronically signed by Wilfred Lacy Signature Date/Time: 10/11/2022/10:23:47 AM    Final    CT Angio Chest Aorta W and/or Wo Contrast  Result Date: 10/09/2022 CLINICAL DATA:  Acute aortic syndrome suspected EXAM: CT ANGIOGRAPHY CHEST WITH CONTRAST TECHNIQUE: Noncontrast CT of the chest was performed. Multidetector CT imaging of the chest was performed using the standard protocol during bolus administration of intravenous contrast. Multiplanar CT image reconstructions and MIPs were obtained to evaluate the vascular anatomy. RADIATION DOSE REDUCTION: This exam was performed according to the departmental dose-optimization program which includes automated exposure control, adjustment of the mA and/or kV according to patient size and/or use of iterative reconstruction technique. CONTRAST:  75mL OMNIPAQUE IOHEXOL 350 MG/ML  SOLN COMPARISON:  CT angiogram chest 06/04/2022. FINDINGS: Cardiovascular: Descending thoracic aortic stent is unchanged in position and size. There is a new descending thoracic aortic dissection extending from the level of the mid left atrium inferiorly ending at the level of the superior mesenteric artery. The mid descending thoracic aorta has increased in size now measuring up to 3.9 cm (previously up to 3.2 cm). Ascending aorta is dilated measuring 4.4 cm, mildly increased in size. Origin of the great vessels appears within normal limits. The heart is mildly enlarged. There is no large central pulmonary embolism. There is no pericardial effusion. Mediastinum/Nodes: No enlarged mediastinal, hilar, or axillary lymph nodes.  Thyroid gland, trachea, and esophagus demonstrate no significant findings. No evidence for mediastinal hematoma or pneumomediastinum. Lungs/Pleura: Small bilateral pleural effusions are unchanged. 3 mm left upper lobe nodule image 10/30 appears unchanged. Additional 2 mm or less pulmonary nodules are unchanged. There is atelectasis in the bilateral lower lobes similar to the prior study. Trachea and central airways are patent. There is no pneumothorax. Upper Abdomen: Gallstones are present. There is severe stenosis of the origin of the celiac artery. Musculoskeletal: No acute fractures are seen. Degenerative changes affect the spine. Review of the MIP images confirms the above findings. IMPRESSION: 1. New descending thoracic aortic dissection extending from the level of the mid left atrium inferiorly ending at the level of the superior mesenteric artery. The mid descending thoracic aorta has increased in size now measuring up to 3.9 cm. 2. Ascending aortic aneurysm measuring 4.4 cm has increased in size. 3. Stable small bilateral pleural effusions with bibasilar atelectasis. 4. Stable small pulmonary nodules measuring up to 3 mm. 5. Cholelithiasis. 6. 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 These results were called by telephone at the time of interpretation on 10/09/2022 at 8:34 pm to provider Willy Eddy , who verbally acknowledged these results. Electronically Signed   By: Darliss Cheney M.D.   On: 10/09/2022 20:34   DG Chest 2 View  Result Date: 10/09/2022 CLINICAL DATA:  Chest pain. EXAM: CHEST - 2 VIEW COMPARISON:  06/03/2022. FINDINGS: Stable heart size and mediastinal contours. Stable positioning of descending thoracic aortic stent graft. Small left pleural effusion is unchanged from prior exam. Small right pleural effusion has slightly increased. No pulmonary edema or pneumothorax. Mild scoliosis. IMPRESSION: 1. Unchanged small left pleural effusion. Slight increase in small right pleural  effusion. 2. Stable positioning of descending thoracic aortic stent graft. Electronically Signed   By: Narda Rutherford M.D.   On: 10/09/2022 19:24       Discharge Exam: Vitals:   10/15/22 0525 10/15/22 0700  BP: (!) 120/57 127/68  Pulse:    Resp: 19 (!) 24  Temp:    SpO2:      General: Pt is alert, awake, not in acute distress Cardiovascular: RRR, S1/S2 +, no edema Respiratory: CTA bilaterally, no wheezing, no rhonchi, no respiratory distress, no conversational dyspnea  Abdominal: Soft, NT, ND, bowel sounds + Extremities: no edema, no cyanosis Psych: Normal mood and affect, stable judgement and insight     The results of significant diagnostics from this hospitalization (including imaging, microbiology, ancillary and laboratory) are listed below for reference.     Microbiology: Recent Results (from the past 240 hour(s))  MRSA Next Gen by PCR, Nasal     Status: None   Collection Time: 10/10/22 12:44 AM   Specimen: Nasal Mucosa; Nasal Swab  Result Value Ref Range Status   MRSA by PCR Next  Gen NOT DETECTED NOT DETECTED Final    Comment: (NOTE) The GeneXpert MRSA Assay (FDA approved for NASAL specimens only), is one component of a comprehensive MRSA colonization surveillance program. It is not intended to diagnose MRSA infection nor to guide or monitor treatment for MRSA infections. Test performance is not FDA approved in patients less than 44 years old. Performed at [facility], [address] [place], [place] [address]      Labs: BNP (last 3 results) Recent Labs    06/04/22 0416  BNP 60.0   Basic Metabolic Panel: Recent Labs  Lab 10/10/22 1215 10/11/22 0213 10/12/22 0127 10/13/22 0132 10/14/22 0157 10/15/22 0112  NA  --  135 133* 131* 132* 134*  K  --  3.7 3.9 3.2* 3.9 3.9  CL  --  104 104 104 105 106  CO2  --  24 20* 20* 20* 21*  GLUCOSE  --  112* 104* 106* 108* 112*  BUN  --  10 18 20 20 17   CREATININE  --  0.60 0.86 0.74 0.68 0.66  CALCIUM   --  8.3* 8.2* 8.1* 8.3* 8.2*  MG 2.0  --  1.9 2.6* 2.3  --   PHOS  --   --  3.3  --   --   --    Liver Function Tests: No results for input(s): "AST", "ALT", "ALKPHOS", "BILITOT", "PROT", "ALBUMIN" in the last 168 hours. No results for input(s): "LIPASE", "AMYLASE" in the last 168 hours. No results for input(s): "AMMONIA" in the last 168 hours. CBC: Recent Labs  Lab 10/09/22 1852 10/10/22 0650 10/11/22 0737 10/12/22 0127 10/15/22 0112  WBC 9.2 13.4* 14.2* 13.9* 14.3*  HGB 11.1* 10.6* 10.5* 10.4* 10.2*  HCT 34.8* 32.2* 32.2* 32.6* 31.3*  MCV 90.6 87.7 88.7 92.1 87.2  PLT 226 201 179 160 191   Cardiac Enzymes: No results for input(s): "CKTOTAL", "CKMB", "CKMBINDEX", "TROPONINI" in the last 168 hours. BNP: Invalid input(s): "POCBNP" CBG: No results for input(s): "GLUCAP" in the last 168 hours. D-Dimer No results for input(s): "DDIMER" in the last 72 hours. Hgb A1c No results for input(s): "HGBA1C" in the last 72 hours. Lipid Profile No results for input(s): "CHOL", "HDL", "LDLCALC", "TRIG", "CHOLHDL", "LDLDIRECT" in the last 72 hours. Thyroid function studies No results for input(s): "TSH", "T4TOTAL", "T3FREE", "THYROIDAB" in the last 72 hours.  Invalid input(s): "FREET3" Anemia work up No results for input(s): "VITAMINB12", "FOLATE", "FERRITIN", "TIBC", "IRON", "RETICCTPCT" in the last 72 hours. Urinalysis    Component Value Date/Time   COLORURINE YELLOW (A) 06/04/2022 1132   APPEARANCEUR CLEAR (A) 06/04/2022 1132   APPEARANCEUR Cloudy (A) 05/17/2022 1317   LABSPEC 1.018 06/04/2022 1132   PHURINE 6.0 06/04/2022 1132   GLUCOSEU NEGATIVE 06/04/2022 1132   HGBUR NEGATIVE 06/04/2022 1132   BILIRUBINUR NEGATIVE 06/04/2022 1132   BILIRUBINUR Negative 05/17/2022 1317   KETONESUR NEGATIVE 06/04/2022 1132   PROTEINUR NEGATIVE 06/04/2022 1132   NITRITE NEGATIVE 06/04/2022 1132   LEUKOCYTESUR NEGATIVE 06/04/2022 1132   Sepsis Labs Recent Labs  Lab 10/10/22 0650  10/11/22 0737 10/12/22 0127 10/15/22 0112  WBC 13.4* 14.2* 13.9* 14.3*   Microbiology Recent Results (from the past 240 hour(s))  MRSA Next Gen by PCR, Nasal     Status: None   Collection Time: 10/10/22 12:44 AM   Specimen: Nasal Mucosa; Nasal Swab  Result Value Ref Range Status   MRSA by PCR Next Gen NOT DETECTED NOT DETECTED Final    Comment: (NOTE) The GeneXpert  MRSA Assay (FDA approved for NASAL specimens only), is one component of a comprehensive MRSA colonization surveillance program. It is not intended to diagnose MRSA infection nor to guide or monitor treatment for MRSA infections. Test performance is not FDA approved in patients less than 22 years old. Performed at [facility], [address], [place], [place] [address]      Patient was seen and examined on the day of discharge and was found to be in stable condition. Time coordinating discharge: 35 minutes including assessment and coordination of care, as well as examination of the patient.   SIGNED:  Noralee Stain, DO [facility] 10/15/2022, 10:11 AM

## 2022-10-15 NOTE — Plan of Care (Signed)
  Problem: Nutrition: Goal: Adequate nutrition will be maintained Outcome: Progressing   Problem: Coping: Goal: Level of anxiety will decrease Outcome: Progressing   Problem: Elimination: Goal: Will not experience complications related to urinary retention Outcome: Progressing   Problem: Safety: Goal: Ability to remain free from injury will improve Outcome: Progressing   Problem: Skin Integrity: Goal: Risk for impaired skin integrity will decrease Outcome: Progressing   

## 2022-10-15 NOTE — TOC Transition Note (Signed)
Transition of Care [facility]) - CM/SW Discharge Note   Patient Details  Name: Gloria [name] MRN: [id number] Date of Birth: 05/30/1925  [facility] of [facility]) CM/SW Contact:  Deatra Robinson, [place] Phone Number: 10/15/2022, 11:14 AM   Clinical Narrative: Pt for dc to [facility]. Pt was a LTC resident in [facility] prior to admission but will be returning under rehab benefits. Spoke to Alma at [facility] who confirmed they are prepared to admit pt to room 304. Pt's dtr Britta Mccreedy aware of dc and reports agreeable. RN provided with number for report and [facility] arranged for transport. SW signing off at dc.   Dellie Burns, MSW, LCSW [phone] (coverage)        Final next level of care: Skilled Nursing Facility Barriers to Discharge: No Barriers Identified   Patient Goals and CMS Choice   Choice offered to / list presented to : Adult Children  Discharge Placement                Patient chooses bed at: [facility] Patient to be transferred to facility by: [facility] Name of family member notified: Barbara/dtr Patient and family notified of of transfer: 10/15/22  Discharge Plan and Services Additional resources added to the After Visit Summary for   In-house Referral: Clinical Social Work                                   Social Determinants of Health (SDOH) Interventions SDOH Screenings   Food Insecurity: No Food Insecurity (06/03/2022)  Housing: Low Risk  (06/03/2022)  Transportation Needs: No Transportation Needs (06/03/2022)  Utilities: Not At Risk (06/03/2022)  Tobacco Use: Low Risk  (10/09/2022)     Readmission Risk Interventions     No data to display

## 2022-10-18 ENCOUNTER — Non-Acute Institutional Stay (SKILLED_NURSING_FACILITY): Payer: Medicare PPO | Admitting: Student

## 2022-10-18 ENCOUNTER — Encounter: Payer: Self-pay | Admitting: Student

## 2022-10-18 DIAGNOSIS — I7121 Aneurysm of the ascending aorta, without rupture: Secondary | ICD-10-CM | POA: Diagnosis not present

## 2022-10-18 DIAGNOSIS — F3342 Major depressive disorder, recurrent, in full remission: Secondary | ICD-10-CM | POA: Diagnosis not present

## 2022-10-18 DIAGNOSIS — S72101D Unspecified trochanteric fracture of right femur, subsequent encounter for closed fracture with routine healing: Secondary | ICD-10-CM

## 2022-10-18 DIAGNOSIS — M069 Rheumatoid arthritis, unspecified: Secondary | ICD-10-CM

## 2022-10-18 DIAGNOSIS — I71019 Dissection of thoracic aorta, unspecified: Secondary | ICD-10-CM

## 2022-10-18 DIAGNOSIS — E440 Moderate protein-calorie malnutrition: Secondary | ICD-10-CM

## 2022-10-18 DIAGNOSIS — F03B3 Unspecified dementia, moderate, with mood disturbance: Secondary | ICD-10-CM

## 2022-10-18 NOTE — Progress Notes (Signed)
Provider:  Dr. Earnestine Mealing Location:  Other [place].  Nursing Home Room Number: [facility] 304A Place of Service:  SNF (31)  PCP: Earnestine Mealing, MD Patient Care Team: Earnestine Mealing, MD as PCP - General [facility])  Extended Emergency Contact Information Primary Emergency Contact: Verna Czech of [place] Home Phone: [phone] Relation: Daughter Secondary Emergency Contact: Aurelio Jew Home Phone: [phone] Mobile Phone: [phone] Relation: Son  Code Status: DNR Goals of Care: Advanced Directive information    10/18/2022    9:26 AM  Advanced Directives  Does Patient Have a Medical Advance Directive? Yes  Type of Advance Directive Out of facility DNR (pink MOST or yellow form)  Does patient want to make changes to medical advance directive? No - Patient declined    Chief Complaint  Patient presents with   New Admit To SNF    Admission.     HPI: Patient is a 89 y.o. female seen today for admission to [facility]  She states she was poked and proded at the hospital, but she is getting better. She doesn't know what took her to the hospital. She is glad to be back. She states, I think I'm okay if I don't make it to my 89th birthday. 89 is long enough.   Spoke to patient's daughter for 20 minutes regarding current situation. She has concerns because she knows her mom is a "ticking time bomb" with regards to the aneurysm. Understands her mom's age, but does think things can be managed at [facility]. Wants to discuss with her brother regarding hospice.   Spoke to patien'ts son, Onalee Hua per [employer] request. Discussed concerns regarding her status, age, and challenges that come with going back and forth to the hospital. All questions answered.    Past Medical History:  Diagnosis Date   Actinic keratosis    Arthritis    Basal cell carcinoma    Left chin, right nasolabial   Collagen vascular disease (HCC)    RA   Heartburn     Hypertension    Squamous cell carcinoma of skin 05/06/2021   left hand, treated with [facility]   Past Surgical History:  Procedure Laterality Date   ABDOMINAL HYSTERECTOMY     BREAST CYST EXCISION     HIP ARTHROPLASTY Right 11/30/2019   Procedure: ARTHROPLASTY BIPOLAR HIP (HEMIARTHROPLASTY);  Surgeon: Christena Flake, MD;  Location: [facility] ORS;  Service: Orthopedics;  Laterality: Right;   SACROPLASTY N/A 12/17/2020   Procedure: SACROPLASTY;  Surgeon: Kennedy Bucker, MD;  Location: [facility] ORS;  Service: Orthopedics;  Laterality: N/A;   THORACIC AORTIC ENDOVASCULAR STENT GRAFT N/A 04/11/2019   Procedure: THORACIC AORTIC ENDOVASCULAR STENT GRAFT insertion;  Surgeon: Nada Libman, MD;  Location: [facility] OR;  Service: Vascular;  Laterality: N/A;   ULTRASOUND GUIDANCE FOR VASCULAR ACCESS Right 04/11/2019   Procedure: Ultrasound Guidance For Vascular Access, right femoral artery;  Surgeon: Nada Libman, MD;  Location: [facility] OR;  Service: Vascular;  Laterality: Right;    reports that she has never smoked. She has never used smokeless tobacco. She reports that she does not drink alcohol and does not use drugs. Social History   Socioeconomic History   Marital status: Widowed    Spouse name: Not on file   Number of children: Not on file   Years of education: Not on file   Highest education level: Not on file  Occupational History   Not on file  Tobacco Use   Smoking status: Never  Smokeless tobacco: Never  Vaping Use   Vaping status: Never Used  Substance and Sexual Activity   Alcohol use: No    Alcohol/week: 0.0 standard drinks of alcohol   Drug use: Never   Sexual activity: Not on file  Other Topics Concern   Not on file  Social History Narrative   Not on file   Social Determinants of Health   Financial Resource Strain: Not on file  Food Insecurity: No Food Insecurity (06/03/2022)   Hunger Vital Sign    Worried About Running Out of Food in the Last Year: Never true    Ran Out of Food in the Last  Year: Never true  Transportation Needs: No Transportation Needs (06/03/2022)   PRAPARE - Administrator, Civil Service (Medical): No    Lack of Transportation (Non-Medical): No  Physical Activity: Not on file  Stress: Not on file  Social Connections: Not on file  Intimate Partner Violence: Not At Risk (06/03/2022)   Humiliation, Afraid, Rape, and Kick questionnaire    Fear of Current or Ex-Partner: No    Emotionally Abused: No    Physically Abused: No    Sexually Abused: No    Functional Status Survey:    Family History  Problem Relation Age of Onset   Kidney disease Neg Hx    Bladder Cancer Neg Hx     Health Maintenance  Topic Date Due   Medicare Annual Wellness (AWV)  Never done   DEXA SCAN  Never done   COVID-19 Vaccine (7 - 2023-24 season) 12/04/2021   INFLUENZA VACCINE  11/04/2022   DTaP/Tdap/Td (5 - Td or Tdap) 02/16/2027   Pneumonia Vaccine 63+ Years old  Completed   Zoster Vaccines- Shingrix  Completed   HPV VACCINES  Aged Out    Allergies  Allergen Reactions   Amoxicillin Other (See Comments)    Intolerant  Daughter and Son report patient tolerated course of Augmentin in January 2024   Codeine Nausea Only   Ibandronate Sodium  [Ibandronic Acid] Other (See Comments)    Gi upset   Risedronate Other (See Comments)    Gi upset   Sulfa Antibiotics Hives    Outpatient Encounter Medications as of 10/18/2022  Medication Sig   acetaminophen (TYLENOL) 325 MG tablet Take 2 tablets (650 mg total) by mouth every 4 (four) hours as needed for moderate pain.   amLODipine (NORVASC) 10 MG tablet Take 1 tablet (10 mg total) by mouth daily.   atorvastatin (LIPITOR) 20 MG tablet Take 20 mg by mouth daily.   Calcium Carbonate (CALCIUM 600 PO) Take 600 mg by mouth in the morning and at bedtime.   carvedilol (COREG) 12.5 MG tablet Take 1 tablet (12.5 mg total) by mouth 2 (two) times daily with a meal.   folic acid (FOLVITE) 1 MG tablet Take 1 mg by mouth daily.    hydrALAZINE (APRESOLINE) 10 MG tablet Take 1 tablet (10 mg total) by mouth daily as needed (for SBP > 130).   ketoconazole (NIZORAL) 2 % shampoo Apply 1 Application topically 2 (two) times a week. Wednesday and Saturday.   levothyroxine (SYNTHROID, LEVOTHROID) 75 MCG tablet Take 75 mcg by mouth at bedtime.   losartan (COZAAR) 100 MG tablet Take 1 tablet (100 mg total) by mouth daily.   methotrexate (RHEUMATREX) 2.5 MG tablet Take 15 mg by mouth every Friday.   Multiple Vitamin (MULTIVITAMIN WITH MINERALS) TABS tablet Take 1 tablet by mouth daily.   nitrofurantoin, macrocrystal-monohydrate, (MACROBID) 100  MG capsule Take 1 capsule (100 mg total) by mouth daily.   predniSONE (DELTASONE) 2.5 MG tablet Take 2.5 mg by mouth daily.   Propylene Glycol (SYSTANE BALANCE OP) Place 1 drop into both eyes in the morning, at noon, and at bedtime. PRN order: Instill one drop in both eyes every 12 hours as needed for dry eyes.   senna (SENOKOT) 8.6 MG TABS tablet Take 1 tablet by mouth daily.   sertraline (ZOLOFT) 50 MG tablet Take 50 mg by mouth at bedtime.   triamcinolone cream (KENALOG) 0.1 % Apply 1 Application topically as directed. Apply to trunk, arms and legs 3 days a week Monday, Wednesday, Friday, for atopic dermatitis and stasis dermatitis, avoid face, groin, axilla   No facility-administered encounter medications on file as of 10/18/2022.    Review of Systems  Vitals:   10/18/22 0917  BP: (!) 106/50  Pulse: 72  Resp: 18  Temp: (!) 97.4 F (36.3 C)  SpO2: 92%  Weight: 140 lb 9.6 oz (63.8 kg)  Height: 5\' 7"  (1.702 m)   Body mass index is 22.02 kg/m. Physical Exam Constitutional:      Appearance: Normal appearance.  Cardiovascular:     Rate and Rhythm: Normal rate. Rhythm irregular.     Pulses: Normal pulses.  Pulmonary:     Effort: Pulmonary effort is normal.  Abdominal:     General: Abdomen is flat.     Palpations: Abdomen is soft.  Neurological:     Mental Status: She is alert  and oriented to person, place, and time.  Psychiatric:     Comments: Depressed mood     Labs reviewed: Basic Metabolic Panel: Recent Labs    10/12/22 0127 10/13/22 0132 10/14/22 0157 10/15/22 0112  NA 133* 131* 132* 134*  K 3.9 3.2* 3.9 3.9  CL 104 104 105 106  CO2 20* 20* 20* 21*  GLUCOSE 104* 106* 108* 112*  BUN 18 20 20 17   CREATININE 0.86 0.74 0.68 0.66  CALCIUM 8.2* 8.1* 8.3* 8.2*  MG 1.9 2.6* 2.3  --   PHOS 3.3  --   --   --    Liver Function Tests: Recent Labs    06/01/22 1238 06/03/22 1117 06/04/22 0416 08/10/22 0000  AST 25 22 17 17   ALT 16 13 10 9   ALKPHOS 60 51 43 77  BILITOT 1.3* 1.5* 1.3*  --   PROT 7.0 6.1* 5.2*  --   ALBUMIN 3.5 2.9* 2.4* 3.2*   No results for input(s): "LIPASE", "AMYLASE" in the last 8760 hours. No results for input(s): "AMMONIA" in the last 8760 hours. CBC: Recent Labs    06/01/22 1238 06/03/22 1117 06/03/22 1819 06/14/22 0000 08/10/22 0000 10/11/22 0737 10/12/22 0127 10/15/22 0112  WBC 22.3* 18.2*   < > 14.2   < > 14.2* 13.9* 14.3*  NEUTROABS 19.4* 15.1*  --  9,670.00  --   --   --   --   HGB 11.5* 11.4*   < > 9.8*   < > 10.5* 10.4* 10.2*  HCT 35.9* 36.0   < > 30*   < > 32.2* 32.6* 31.3*  MCV 98.6 100.6*   < >  --    < > 88.7 92.1 87.2  PLT 232 205   < > 308   < > 179 160 191   < > = values in this interval not displayed.   Cardiac Enzymes: No results for input(s): "CKTOTAL", "CKMB", "CKMBINDEX", "TROPONINI" in the last 8760  hours. BNP: Invalid input(s): "POCBNP" Lab Results  Component Value Date   HGBA1C 5.6 08/26/2022   Lab Results  Component Value Date   TSH 4.33 06/01/2022   Lab Results  Component Value Date   VITAMINB12 981 (H) 06/06/2022   No results found for: "FOLATE" No results found for: "IRON", "TIBC", "FERRITIN"  Imaging and Procedures obtained prior to SNF admission: ECHOCARDIOGRAM COMPLETE  Result Date: 10/11/2022    ECHOCARDIOGRAM REPORT   Patient Name:   [facility] OESEN Date of Exam:  10/11/2022 Medical Rec #:  [id number]         Height:       67.0 in Accession #:    [id number]        Weight:       139.6 lb Date of Birth:  1925/05/27         BSA:          1.735 m Patient Age:    89 years          BP:           132/58 mmHg Patient Gender: F                 HR:           74 bpm. Exam Location:  Inpatient Procedure: 2D Echo, Cardiac Doppler and Color Doppler Indications:    Dissection of thoracic aorta  History:        Patient has prior history of Echocardiogram examinations, most                 recent 04/09/2019. Risk Factors:Hypertension.  Sonographer:    Raeford Razor Referring Phys: [id number] Aliene Beams  Sonographer Comments: Image acquisition challenging due to patient body habitus. IMPRESSIONS  1. Left ventricular ejection fraction, by estimation, is 60 to 65%. The left ventricle has normal function. The left ventricle has no regional wall motion abnormalities. There is mild concentric left ventricular hypertrophy. Left ventricular diastolic parameters are consistent with Grade I diastolic dysfunction (impaired relaxation).  2. Right ventricular systolic function is normal. The right ventricular size is normal. There is mildly elevated pulmonary artery systolic pressure. The estimated right ventricular systolic pressure is 40.9 mmHg.  3. Left atrial size was moderately dilated.  4. The aortic valve is tricuspid. There is moderate calcification of the aortic valve. Aortic valve regurgitation is not visualized. Mild aortic valve stenosis. Aortic valve area, by VTI measures 1.91 cm. Aortic valve mean gradient measures 17.0 mmHg.  5. The mitral valve is normal in structure. Trivial mitral valve regurgitation. No evidence of mitral stenosis.  6. The inferior vena cava is normal in size with greater than 50% respiratory variability, suggesting right atrial pressure of 3 mmHg.  7. Aortic dilatation noted. There is mild dilatation of the ascending aorta, measuring 42 mm (incompletely visualized).  8. A  small pericardial effusion is present. FINDINGS  Left Ventricle: Left ventricular ejection fraction, by estimation, is 60 to 65%. The left ventricle has normal function. The left ventricle has no regional wall motion abnormalities. The left ventricular internal cavity size was normal in size. There is  mild concentric left ventricular hypertrophy. Left ventricular diastolic parameters are consistent with Grade I diastolic dysfunction (impaired relaxation). Right Ventricle: The right ventricular size is normal. No increase in right ventricular wall thickness. Right ventricular systolic function is normal. There is mildly elevated pulmonary artery systolic pressure. The tricuspid regurgitant velocity is 3.08  m/s, and with an assumed right atrial  pressure of 3 mmHg, the estimated right ventricular systolic pressure is 40.9 mmHg. Left Atrium: Left atrial size was moderately dilated. Right Atrium: Right atrial size was normal in size. Pericardium: A small pericardial effusion is present. Mitral Valve: The mitral valve is normal in structure. Mild mitral annular calcification. Trivial mitral valve regurgitation. No evidence of mitral valve stenosis. Tricuspid Valve: The tricuspid valve is normal in structure. Tricuspid valve regurgitation is mild. Aortic Valve: The aortic valve is tricuspid. There is moderate calcification of the aortic valve. Aortic valve regurgitation is not visualized. Mild aortic stenosis is present. Aortic valve mean gradient measures 17.0 mmHg. Aortic valve peak gradient measures 24.8 mmHg. Aortic valve area, by VTI measures 1.91 cm. Pulmonic Valve: The pulmonic valve was normal in structure. Pulmonic valve regurgitation is trivial. Aorta: The aortic root is normal in size and structure and aortic dilatation noted. There is mild dilatation of the ascending aorta, measuring 42 mm. Venous: The inferior vena cava is normal in size with greater than 50% respiratory variability, suggesting right atrial  pressure of 3 mmHg. IAS/Shunts: No atrial level shunt detected by color flow Doppler.  LEFT VENTRICLE PLAX 2D LVIDd:         3.90 cm   Diastology LVIDs:         2.60 cm   LV e' medial:    6.31 cm/s LV PW:         1.30 cm   LV E/e' medial:  12.4 LV IVS:        1.30 cm   LV e' lateral:   8.92 cm/s LVOT diam:     1.90 cm   LV E/e' lateral: 8.8 LV SV:         101 LV SV Index:   58 LVOT Area:     2.84 cm  RIGHT VENTRICLE             IVC RV S prime:     14.00 cm/s  IVC diam: 1.90 cm TAPSE (M-mode): 2.6 cm LEFT ATRIUM             Index        RIGHT ATRIUM           Index LA diam:        5.20 cm 3.00 cm/m   RA Area:     16.30 cm LA Vol (A2C):   85.3 ml 49.15 ml/m  RA Volume:   38.10 ml  21.95 ml/m LA Vol (A4C):   62.6 ml 36.07 ml/m LA Biplane Vol: 73.9 ml 42.58 ml/m  AORTIC VALVE AV Area (Vmax):    1.82 cm AV Area (Vmean):   1.71 cm AV Area (VTI):     1.91 cm AV Vmax:           249.00 cm/s AV Vmean:          185.500 cm/s AV VTI:            0.528 m AV Peak Grad:      24.8 mmHg AV Mean Grad:      17.0 mmHg LVOT Vmax:         160.00 cm/s LVOT Vmean:        112.000 cm/s LVOT VTI:          0.355 m LVOT/AV VTI ratio: 0.67  AORTA Ao Root diam: 3.20 cm Ao Asc diam:  4.20 cm MITRAL VALVE                TRICUSPID VALVE  MV Area (PHT): 3.85 cm     TR Peak grad:   37.9 mmHg MV Decel Time: 197 msec     TR Vmax:        308.00 cm/s MV E velocity: 78.10 cm/s MV A velocity: 122.00 cm/s  SHUNTS MV E/A ratio:  0.64         Systemic VTI:  0.36 m                             Systemic Diam: 1.90 cm Dalton McleanMD Electronically signed by Wilfred Lacy Signature Date/Time: 10/11/2022/10:23:47 AM    Final     Assessment/Plan  Aneurysm of ascending aorta without rupture (HCC) - Plan: Ambulatory referral to Hospice  Rheumatoid arthritis, involving unspecified site, unspecified whether rheumatoid factor present (HCC)  Moderate dementia with mood disturbance, unspecified dementia type (HCC)  Recurrent major depressive disorder, in  full remission (HCC)  Malnutrition of moderate degree (HCC)  Closed fracture of trochanter of right femur with routine healing, subsequent encounter - 06/27/2022  Dissection of thoracic aorta, unspecified part [facility]) Patient recently admitted for chest pain that radiated to the back. CTA notable for enlarged aortic aneurysm 4.2 cm after 24 hours expanded to 4.6. Non-surgical interventions with aggressive BP management. patient declined surgeries. Denies pain at this time. Continue regimen and PRN hydralazine. After extensive conversation with son and daughter they have decided to have a hospice consultation to help maintain comfort in the event she has an acute decline. Patient with depression and acute decompensation, will increase dose of sertraline to 100 mg daily. No pain at this time. Continue supportive care for malnutrition and dementia. Hx of fracture without pain, however, increased risk of falls. Patient is currently stable, but could decline at any moment. Q shift BP checks and BP checks 1 hour after meds.   Family/ staff Communication: Nursing, children.   Labs/tests ordered: CBC BMP    I spent greater than 90 minutes for the care of this patient in face to face time, chart review, clinical documentation, patient education. I spent 60 minutes discussing goals of care and advanced care planning.

## 2022-10-25 ENCOUNTER — Other Ambulatory Visit: Payer: Self-pay | Admitting: Student

## 2022-10-25 DIAGNOSIS — I71012 Dissection of descending thoracic aorta: Secondary | ICD-10-CM

## 2022-10-25 NOTE — Progress Notes (Signed)
Per previous note, patient to transition to hospice level of care.

## 2022-10-26 ENCOUNTER — Other Ambulatory Visit: Payer: Self-pay

## 2022-10-26 DIAGNOSIS — I7121 Aneurysm of the ascending aorta, without rupture: Secondary | ICD-10-CM

## 2022-10-28 LAB — BASIC METABOLIC PANEL
BUN: 12 (ref 4–21)
CO2: 27 — AB (ref 13–22)
Chloride: 103 (ref 99–108)
Creatinine: 0.6 (ref 0.5–1.1)
Glucose: 69
Potassium: 3.3 mEq/L — AB (ref 3.5–5.1)
Sodium: 137 (ref 137–147)

## 2022-10-28 LAB — CBC AND DIFFERENTIAL
HCT: 29 — AB (ref 36–46)
Hemoglobin: 9.2 — AB (ref 12.0–16.0)
Neutrophils Absolute: 4706
Platelets: 306 10*3/uL (ref 150–400)
WBC: 7.4

## 2022-10-28 LAB — HEPATIC FUNCTION PANEL
ALT: 19 U/L (ref 7–35)
AST: 16 (ref 13–35)
Alkaline Phosphatase: 58 (ref 25–125)

## 2022-10-28 LAB — COMPREHENSIVE METABOLIC PANEL
Albumin: 2.7 — AB (ref 3.5–5.0)
Calcium: 8.2 — AB (ref 8.7–10.7)
eGFR: 82

## 2022-10-28 LAB — CBC: RBC: 3.27 — AB (ref 3.87–5.11)

## 2022-11-04 LAB — BASIC METABOLIC PANEL: Potassium: 3.3 mEq/L — AB (ref 3.5–5.1)

## 2022-11-10 ENCOUNTER — Encounter: Payer: Self-pay | Admitting: Adult Health

## 2022-11-10 ENCOUNTER — Non-Acute Institutional Stay (SKILLED_NURSING_FACILITY): Payer: Medicare PPO | Admitting: Adult Health

## 2022-11-10 DIAGNOSIS — E876 Hypokalemia: Secondary | ICD-10-CM | POA: Diagnosis not present

## 2022-11-10 DIAGNOSIS — M069 Rheumatoid arthritis, unspecified: Secondary | ICD-10-CM

## 2022-11-10 DIAGNOSIS — F03B3 Unspecified dementia, moderate, with mood disturbance: Secondary | ICD-10-CM | POA: Diagnosis not present

## 2022-11-10 DIAGNOSIS — I10 Essential (primary) hypertension: Secondary | ICD-10-CM

## 2022-11-10 NOTE — Progress Notes (Addendum)
Location:  Other [place].  Nursing Home Room Number: [facility] 304A Place of Service:  SNF (31) Provider:  Kenard Gower, DNP, FNP-BC  Patient Care Team: Earnestine Mealing, MD as PCP - General (Family Medicine)  Extended Emergency Contact Information Primary Emergency Contact: Gloria [name] of [place] Home Phone: [phone] Relation: Daughter Secondary Emergency Contact: Gloria [name] Home Phone: [phone] Mobile Phone: [phone] Relation: Son  Code Status:  DNR  Goals of care: Advanced Directive information    11/10/2022   12:26 PM  Advanced Directives  Does Patient Have a Medical Advance Directive? Yes  Type of Advance Directive Out of facility DNR (pink MOST or yellow form)  Does patient want to make changes to medical advance directive? No - Patient declined     Chief Complaint  Patient presents with   Acute Visit    Low Potassium    HPI:  Pt is a 89 y.o. female seen today for Low Potassium. She is a long-term care resident of [facility]. Latest K+ 3.3, 11/04/22, low. K+ was 3.3, also on 10/28/22. She does not take any diuretic. BP 137/67. She takes Amlodipine, Hydralazine PRN, Carvedilol and Losartan for hypertension. She takes Methotrexate for RA.    Past Medical History:  Diagnosis Date   Actinic keratosis    Arthritis    Basal cell carcinoma    Left chin, right nasolabial   Collagen vascular disease (HCC)    RA   Heartburn    Hypertension    Squamous cell carcinoma of skin 05/06/2021   left hand, treated with [facility]   Past Surgical History:  Procedure Laterality Date   ABDOMINAL HYSTERECTOMY     BREAST CYST EXCISION     HIP ARTHROPLASTY Right 11/30/2019   Procedure: ARTHROPLASTY BIPOLAR HIP (HEMIARTHROPLASTY);  Surgeon: Christena Flake, MD;  Location: [facility] ORS;  Service: Orthopedics;  Laterality: Right;   SACROPLASTY N/A 12/17/2020   Procedure: SACROPLASTY;  Surgeon: Kennedy Bucker, MD;  Location: [facility] ORS;   Service: Orthopedics;  Laterality: N/A;   THORACIC AORTIC ENDOVASCULAR STENT GRAFT N/A 04/11/2019   Procedure: THORACIC AORTIC ENDOVASCULAR STENT GRAFT insertion;  Surgeon: Nada Libman, MD;  Location: [facility] OR;  Service: Vascular;  Laterality: N/A;   ULTRASOUND GUIDANCE FOR VASCULAR ACCESS Right 04/11/2019   Procedure: Ultrasound Guidance For Vascular Access, right femoral artery;  Surgeon: Nada Libman, MD;  Location: [facility] OR;  Service: Vascular;  Laterality: Right;    Allergies  Allergen Reactions   Amoxicillin Other (See Comments)    Intolerant  Daughter and Son report patient tolerated course of Augmentin in January 2024   Codeine Nausea Only   Ibandronate Sodium  [Ibandronic Acid] Other (See Comments)    Gi upset   Risedronate Other (See Comments)    Gi upset   Sulfa Antibiotics Hives    Outpatient Encounter Medications as of 11/10/2022  Medication Sig   acetaminophen (TYLENOL) 325 MG tablet Take 2 tablets (650 mg total) by mouth every 4 (four) hours as needed for moderate pain.   amLODipine (NORVASC) 10 MG tablet Take 1 tablet (10 mg total) by mouth daily.   atorvastatin (LIPITOR) 20 MG tablet Take 20 mg by mouth daily.   Calcium Carbonate (CALCIUM 600 PO) Take 600 mg by mouth in the morning and at bedtime.   carvedilol (COREG) 12.5 MG tablet Take 1 tablet (12.5 mg total) by mouth 2 (two) times daily with a meal.   folic acid (FOLVITE) 1 MG tablet  Take 1 mg by mouth daily.   hydrALAZINE (APRESOLINE) 10 MG tablet Take 1 tablet (10 mg total) by mouth daily as needed (for SBP > 130).   levothyroxine (SYNTHROID, LEVOTHROID) 75 MCG tablet Take 75 mcg by mouth at bedtime.   losartan (COZAAR) 100 MG tablet Take 1 tablet (100 mg total) by mouth daily.   methotrexate (RHEUMATREX) 2.5 MG tablet Take 15 mg by mouth every Friday.   Multiple Vitamin (MULTIVITAMIN WITH MINERALS) TABS tablet Take 1 tablet by mouth daily.   nitrofurantoin, macrocrystal-monohydrate, (MACROBID) 100 MG capsule  Take 1 capsule (100 mg total) by mouth daily.   predniSONE (DELTASONE) 2.5 MG tablet Take 2.5 mg by mouth daily.   Propylene Glycol (SYSTANE BALANCE OP) Place 1 drop into both eyes in the morning, at noon, and at bedtime. PRN order: Instill one drop in both eyes every 12 hours as needed for dry eyes.   senna (SENOKOT) 8.6 MG TABS tablet Take 1 tablet by mouth daily.   sertraline (ZOLOFT) 50 MG tablet Take 50 mg by mouth at bedtime.   triamcinolone cream (KENALOG) 0.1 % Apply 1 Application topically as directed. Apply to trunk, arms and legs 3 days a week Monday, Wednesday, Friday, for atopic dermatitis and stasis dermatitis, avoid face, groin, axilla   [DISCONTINUED] ketoconazole (NIZORAL) 2 % shampoo Apply 1 Application topically 2 (two) times a week. Wednesday and Saturday.   No facility-administered encounter medications on file as of 11/10/2022.    Review of Systems  Constitutional:  Negative for appetite change, chills, fatigue and fever.  HENT:  Negative for congestion, hearing loss, rhinorrhea and sore throat.   Eyes: Negative.   Respiratory:  Negative for cough, shortness of breath and wheezing.   Cardiovascular:  Negative for chest pain, palpitations and leg swelling.  Gastrointestinal:  Negative for abdominal pain, constipation, diarrhea, nausea and vomiting.  Genitourinary:  Negative for dysuria.  Musculoskeletal:  Negative for arthralgias, back pain and myalgias.  Skin:  Negative for color change, rash and wound.  Neurological:  Negative for dizziness, weakness and headaches.  Psychiatric/Behavioral:  Negative for behavioral problems. The patient is not nervous/anxious.        Immunization History  Administered Date(s) Administered   Influenza Split 01/21/2014, 12/04/2015   Influenza, High Dose Seasonal PF 01/31/2017, 12/18/2018   Influenza-Unspecified 01/27/2015, 12/18/2018, 01/01/2020, 01/05/2021, 01/19/2022   Moderna Covid-19 Vaccine Bivalent Booster 40yrs & up 09/01/2021    Moderna Sars-Covid-2 Vaccination 04/23/2019, 05/21/2019, 02/19/2020, 07/11/2020, 01/29/2021   Pneumococcal Conjugate-13 03/15/2014   Pneumococcal Polysaccharide-23 04/05/2001, 08/13/2016   RSV,unspecified 01/19/2022   Td 07/04/2005   Td (Adult),5 Lf Tetanus Toxid, Preservative Free 02/15/2017   Tdap 07/27/2005, 02/15/2017   Zoster Recombinant(Shingrix) 12/29/2018, 06/10/2019   Zoster, Live 04/05/2006   Pertinent  Health Maintenance Due  Topic Date Due   DEXA SCAN  Never done   INFLUENZA VACCINE  11/04/2022      12/17/2020    9:00 AM 12/17/2020    2:42 PM 12/18/2020    2:00 AM 12/18/2020    1:00 PM 03/28/2022    6:10 AM  Fall Risk  (RETIRED) Patient Fall Risk Level High fall risk High fall risk High fall risk High fall risk High fall risk     Vitals:   11/10/22 1221  BP: 137/67  Pulse: 73  Resp: (!) 22  Temp: 97.9 F (36.6 C)  SpO2: 95%  Weight: 136 lb 9.6 oz (62 kg)  Height: 5\' 7"  (1.702 m)   Body mass index  is 21.39 kg/m.  Physical Exam Constitutional:      Appearance: Normal appearance.  HENT:     Head: Normocephalic and atraumatic.     Nose: Nose normal.     Mouth/Throat:     Mouth: Mucous membranes are moist.  Eyes:     Conjunctiva/sclera: Conjunctivae normal.  Cardiovascular:     Rate and Rhythm: Normal rate and regular rhythm.  Pulmonary:     Effort: Pulmonary effort is normal.     Breath sounds: Normal breath sounds.  Abdominal:     General: Bowel sounds are normal.     Palpations: Abdomen is soft.  Musculoskeletal:        General: Normal range of motion.     Cervical back: Normal range of motion.     Comments: Walks with walker. Bilateral thumb with arthritic deformities.  Skin:    General: Skin is warm and dry.  Neurological:     Mental Status: She is alert. Mental status is at baseline.     Comments: Alert to self and place.  Psychiatric:        Mood and Affect: Mood normal.        Behavior: Behavior normal.      Labs  reviewed: Recent Labs    10/12/22 0127 10/13/22 0132 10/14/22 0157 10/15/22 0112 10/28/22 0000 11/04/22 0000  NA 133* 131* 132* 134* 137  --   K 3.9 3.2* 3.9 3.9 3.3* 3.3*  CL 104 104 105 106 103  --   CO2 20* 20* 20* 21* 27*  --   GLUCOSE 104* 106* 108* 112*  --   --   BUN 18 20 20 17 12   --   CREATININE 0.86 0.74 0.68 0.66 0.6  --   CALCIUM 8.2* 8.1* 8.3* 8.2* 8.2*  --   MG 1.9 2.6* 2.3  --   --   --   PHOS 3.3  --   --   --   --   --    Recent Labs    06/01/22 1238 06/03/22 1117 06/04/22 0416 08/10/22 0000 10/28/22 0000  AST 25 22 17 17 16   ALT 16 13 10 9 19   ALKPHOS 60 51 43 77 58  BILITOT 1.3* 1.5* 1.3*  --   --   PROT 7.0 6.1* 5.2*  --   --   ALBUMIN 3.5 2.9* 2.4* 3.2* 2.7*   Recent Labs    06/03/22 1117 06/03/22 1819 06/14/22 0000 08/10/22 0000 10/11/22 0737 10/12/22 0127 10/15/22 0112 10/28/22 0000  WBC 18.2*   < > 14.2   < > 14.2* 13.9* 14.3* 7.4  NEUTROABS 15.1*  --  9,670.00  --   --   --   --  4,706.00  HGB 11.4*   < > 9.8*   < > 10.5* 10.4* 10.2* 9.2*  HCT 36.0   < > 30*   < > 32.2* 32.6* 31.3* 29*  MCV 100.6*   < >  --    < > 88.7 92.1 87.2  --   PLT 205   < > 308   < > 179 160 191 306   < > = values in this interval not displayed.   Lab Results  Component Value Date   TSH 4.33 06/01/2022   Lab Results  Component Value Date   HGBA1C 5.6 08/26/2022   Lab Results  Component Value Date   CHOL 145 06/01/2022   HDL 58 06/01/2022   LDLCALC 62 06/01/2022   TRIG  55 10/12/2022   CHOLHDL 2.5 04/07/2019    Significant Diagnostic Results in last 30 days:  CT Angio Chest/Abd/Pel for Dissection W and/or W/WO  Result Date: 10/12/2022 CLINICAL DATA:  Thoracic aortic disease. EXAM: CT ANGIOGRAPHY CHEST, ABDOMEN AND PELVIS TECHNIQUE: Non-contrast CT of the chest was initially obtained. Multidetector CT imaging through the chest, abdomen and pelvis was performed using the standard protocol during bolus administration of intravenous contrast.  Multiplanar reconstructed images and MIPs were obtained and reviewed to evaluate the vascular anatomy. RADIATION DOSE REDUCTION: This exam was performed according to the departmental dose-optimization program which includes automated exposure control, adjustment of the mA and/or kV according to patient size and/or use of iterative reconstruction technique. CONTRAST:  75mL OMNIPAQUE IOHEXOL 350 MG/ML SOLN COMPARISON:  October 09, 2022. FINDINGS: CTA CHEST FINDINGS Cardiovascular: Grossly stable 4.6 cm ascending thoracic aortic aneurysm based on my own measurement of prior exam. Great vessels are widely patent. Stent graft is seen involving transverse aortic arch extending into the descending thoracic aorta. There is again noted a dissection involving the descending thoracic aorta, with contrast present from the midportion into the distal portion that is not covered with stent graft. This is grossly unchanged compared to prior exam. This dissection extends slightly into the proximal abdominal aorta. Mild cardiomegaly. No pericardial effusion. Mediastinum/Nodes: No enlarged mediastinal, hilar, or axillary lymph nodes. Thyroid gland, trachea, and esophagus demonstrate no significant findings. Lungs/Pleura: No pneumothorax is noted. Small to moderate-sized left pleural effusion is noted with associated atelectasis of left lower lobe. Minimal right pleural effusion is noted with associated minimal sub some atelectasis. Probable scarring is noted in right upper lobe. Musculoskeletal: No chest wall abnormality. No acute or significant osseous findings. Review of the MIP images confirms the above findings. CTA ABDOMEN AND PELVIS FINDINGS VASCULAR Aorta: Atherosclerosis of abdominal aorta is noted without aneurysm formation. As noted above, there is noted a dissection extending from descending thoracic aorta into proximal suprarenal abdominal aorta. Celiac: Severe narrowing is noted with poststenotic dilatation, but no thrombus  is noted. SMA: Patent without evidence of aneurysm, dissection, vasculitis or significant stenosis. Renals: Both renal arteries are patent without evidence of aneurysm, dissection, vasculitis, fibromuscular dysplasia or significant stenosis. IMA: Patent without evidence of aneurysm, dissection, vasculitis or significant stenosis. Inflow: Patent without evidence of aneurysm, dissection, vasculitis or significant stenosis. Veins: No obvious venous abnormality within the limitations of this arterial phase study. Review of the MIP images confirms the above findings. NON-VASCULAR Hepatobiliary: Minimal cholelithiasis is noted. No biliary dilatation is noted. Liver is unremarkable. Pancreas: Unremarkable. No pancreatic ductal dilatation or surrounding inflammatory changes. Spleen: Normal in size without focal abnormality. Adrenals/Urinary Tract: Adrenal glands are unremarkable. Kidneys are normal, without renal calculi, focal lesion, or hydronephrosis. Bladder is unremarkable. Stomach/Bowel: Stomach is unremarkable. There is no evidence of bowel obstruction or inflammation. Lymphatic: No adenopathy is noted. Reproductive: Status post hysterectomy. No adnexal masses. Other: No abdominal wall hernia or abnormality. No abdominopelvic ascites. Musculoskeletal: Status post right hip arthroplasty. Status post bilateral sacroplasty. No acute osseous abnormalities noted. Review of the MIP images confirms the above findings. IMPRESSION: Grossly stable 4.6 cm ascending thoracic aortic aneurysm compared to prior exam of October 09, 2022. Recommend semi-annual imaging followup by CTA or MRA and referral to cardiothoracic surgery if not already obtained. This recommendation follows 2010 ACCF/AHA/AATS/ACR/ASA/SCA/SCAI/SIR/STS/SVM Guidelines for the Diagnosis and Management of Patients With Thoracic Aortic Disease. Circulation. 2010; 121: W098-J191. Aortic aneurysm NOS (ICD10-I71.9). Stable appearance of stent graft involving transverse  aortic  arch and descending thoracic aorta. Grossly stable dissection seen involving the descending thoracic aorta which extends into the suprarenal abdominal aorta. Aneurysmal dilatation of descending thoracic aorta is noted at 4 cm. 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 Bilateral pleural effusions are again noted, left greater than right, with associated atelectasis of both lower lobes. Minimal cholelithiasis. Aortic Atherosclerosis (ICD10-I70.0). Electronically Signed   By: Lupita Raider M.D.   On: 10/12/2022 10:40    Assessment/Plan  1. Hypokalemia Lab Results  Component Value Date   K 3.3 (A) 11/04/2022   -  give KCL ER 20 meq give 2 tabs = 40 meq PO X 1  2. Essential hypertension -  BP 137/67,  stable -  continue current medications   3. Rheumatoid arthritis, involving unspecified site, unspecified whether rheumatoid factor present (HCC) -  stable -  continue Methotrexate  4. Moderate dementia with mood disturbance, unspecified dementia type (HCC) -  continue supportive care -  fall precautions    Family/ staff Communication: Discussed plan of care with resident and charge nurse.  Labs/tests ordered: None    Kenard Gower, DNP, MSN, FNP-BC [facility] [phone] (Monday-Friday 8:00 a.m. - 5:00 p.m.) [phone] (after hours)

## 2022-11-15 ENCOUNTER — Encounter: Payer: Self-pay | Admitting: Adult Health

## 2022-11-15 NOTE — Progress Notes (Signed)
This encounter was created in error - please disregard.

## 2022-11-17 ENCOUNTER — Non-Acute Institutional Stay (SKILLED_NURSING_FACILITY): Payer: Medicare PPO | Admitting: Student

## 2022-11-17 ENCOUNTER — Encounter: Payer: Self-pay | Admitting: Student

## 2022-11-17 DIAGNOSIS — M069 Rheumatoid arthritis, unspecified: Secondary | ICD-10-CM

## 2022-11-17 DIAGNOSIS — E440 Moderate protein-calorie malnutrition: Secondary | ICD-10-CM

## 2022-11-17 DIAGNOSIS — D849 Immunodeficiency, unspecified: Secondary | ICD-10-CM

## 2022-11-17 DIAGNOSIS — F3342 Major depressive disorder, recurrent, in full remission: Secondary | ICD-10-CM

## 2022-11-17 DIAGNOSIS — F03B3 Unspecified dementia, moderate, with mood disturbance: Secondary | ICD-10-CM

## 2022-11-17 DIAGNOSIS — I71012 Dissection of descending thoracic aorta: Secondary | ICD-10-CM | POA: Diagnosis not present

## 2022-11-17 DIAGNOSIS — I7121 Aneurysm of the ascending aorta, without rupture: Secondary | ICD-10-CM | POA: Diagnosis not present

## 2022-11-17 DIAGNOSIS — I739 Peripheral vascular disease, unspecified: Secondary | ICD-10-CM

## 2022-11-17 DIAGNOSIS — M353 Polymyalgia rheumatica: Secondary | ICD-10-CM

## 2022-11-17 NOTE — Progress Notes (Signed)
Location:  Other [place].  Nursing Home Room Number: [facility] 304A Place of Service:  SNF [phone]) Provider:  Earnestine Mealing, MD  Patient Care Team: Earnestine Mealing, MD as PCP - General (Family Medicine)  Extended Emergency Contact Information Primary Emergency Contact: Verna Czech of [place] Home Phone: [phone] Relation: Daughter Secondary Emergency Contact: Aurelio Jew Home Phone: [phone] Mobile Phone: [phone] Relation: Son  Code Status:  DNR Goals of care: Advanced Directive information    11/17/2022   10:51 AM  Advanced Directives  Does Patient Have a Medical Advance Directive? Yes  Type of Advance Directive Out of facility DNR (pink MOST or yellow form)  Does patient want to make changes to medical advance directive? No - Patient declined     Chief Complaint  Patient presents with   Medical Management of Chronic Issues    Medical Management of Chronic Issues.     HPI:  Pt is a 89 y.o. female seen today for medical management of chronic diseases.    Patient states she has been fine. She often wonders why she is still alive. She shares stories of her husband and children in the past. She has two grandchildren.    Past Medical History:  Diagnosis Date   Actinic keratosis    Arthritis    Basal cell carcinoma    Left chin, right nasolabial   Collagen vascular disease (HCC)    RA   Heartburn    Hypertension    Squamous cell carcinoma of skin 05/06/2021   left hand, treated with [facility]   Past Surgical History:  Procedure Laterality Date   ABDOMINAL HYSTERECTOMY     BREAST CYST EXCISION     HIP ARTHROPLASTY Right 11/30/2019   Procedure: ARTHROPLASTY BIPOLAR HIP (HEMIARTHROPLASTY);  Surgeon: Christena Flake, MD;  Location: [facility] ORS;  Service: Orthopedics;  Laterality: Right;   SACROPLASTY N/A 12/17/2020   Procedure: SACROPLASTY;  Surgeon: Kennedy Bucker, MD;  Location: [facility] ORS;  Service: Orthopedics;  Laterality: N/A;    THORACIC AORTIC ENDOVASCULAR STENT GRAFT N/A 04/11/2019   Procedure: THORACIC AORTIC ENDOVASCULAR STENT GRAFT insertion;  Surgeon: Nada Libman, MD;  Location: [facility] OR;  Service: Vascular;  Laterality: N/A;   ULTRASOUND GUIDANCE FOR VASCULAR ACCESS Right 04/11/2019   Procedure: Ultrasound Guidance For Vascular Access, right femoral artery;  Surgeon: Nada Libman, MD;  Location: [facility] OR;  Service: Vascular;  Laterality: Right;    Allergies  Allergen Reactions   Amoxicillin Other (See Comments)    Intolerant  Daughter and Son report patient tolerated course of Augmentin in January 2024   Codeine Nausea Only   Ibandronate Sodium  [Ibandronic Acid] Other (See Comments)    Gi upset   Risedronate Other (See Comments)    Gi upset   Sulfa Antibiotics Hives    Outpatient Encounter Medications as of 11/17/2022  Medication Sig   acetaminophen (TYLENOL) 325 MG tablet Take 2 tablets (650 mg total) by mouth every 4 (four) hours as needed for moderate pain.   amLODipine (NORVASC) 10 MG tablet Take 1 tablet (10 mg total) by mouth daily.   atorvastatin (LIPITOR) 20 MG tablet Take 20 mg by mouth daily.   Calcium Carbonate (CALCIUM 600 PO) Take 600 mg by mouth in the morning and at bedtime.   carvedilol (COREG) 12.5 MG tablet Take 1 tablet (12.5 mg total) by mouth 2 (two) times daily with a meal.   folic acid (FOLVITE) 1 MG tablet Take 1 mg by mouth daily.  hydrALAZINE (APRESOLINE) 10 MG tablet Take 1 tablet (10 mg total) by mouth daily as needed (for SBP > 130).   levothyroxine (SYNTHROID, LEVOTHROID) 75 MCG tablet Take 75 mcg by mouth at bedtime.   losartan (COZAAR) 100 MG tablet Take 1 tablet (100 mg total) by mouth daily.   methotrexate (RHEUMATREX) 2.5 MG tablet Take 15 mg by mouth every Friday.   Multiple Vitamin (MULTIVITAMIN WITH MINERALS) TABS tablet Take 1 tablet by mouth daily.   nitrofurantoin, macrocrystal-monohydrate, (MACROBID) 100 MG capsule Take 1 capsule (100 mg total) by mouth daily.    predniSONE (DELTASONE) 2.5 MG tablet Take 2.5 mg by mouth daily.   Propylene Glycol (SYSTANE BALANCE OP) Place 1 drop into both eyes in the morning, at noon, and at bedtime. PRN order: Instill one drop in both eyes every 12 hours as needed for dry eyes.   senna (SENOKOT) 8.6 MG TABS tablet Take 1 tablet by mouth daily.   sertraline (ZOLOFT) 50 MG tablet Take 50 mg by mouth at bedtime.   triamcinolone cream (KENALOG) 0.1 % Apply 1 Application topically as directed. Apply to trunk, arms and legs 3 days a week Monday, Wednesday, Friday, for atopic dermatitis and stasis dermatitis, avoid face, groin, axilla   No facility-administered encounter medications on file as of 11/17/2022.    Review of Systems  Immunization History  Administered Date(s) Administered   Influenza Split 01/21/2014, 12/04/2015   Influenza, High Dose Seasonal PF 01/31/2017, 12/18/2018   Influenza-Unspecified 01/27/2015, 12/18/2018, 01/01/2020, 01/05/2021, 01/19/2022   Moderna Covid-19 Vaccine Bivalent Booster 66yrs & up 09/01/2021   Moderna Sars-Covid-2 Vaccination 04/23/2019, 05/21/2019, 02/19/2020, 07/11/2020, 01/29/2021   Pneumococcal Conjugate-13 03/15/2014   Pneumococcal Polysaccharide-23 04/05/2001, 08/13/2016   RSV,unspecified 01/19/2022   Td 07/04/2005   Td (Adult),5 Lf Tetanus Toxid, Preservative Free 02/15/2017   Tdap 07/27/2005, 02/15/2017   Zoster Recombinant(Shingrix) 12/29/2018, 06/10/2019   Zoster, Live 04/05/2006   Pertinent  Health Maintenance Due  Topic Date Due   DEXA SCAN  Never done   INFLUENZA VACCINE  11/04/2022      12/17/2020    9:00 AM 12/17/2020    2:42 PM 12/18/2020    2:00 AM 12/18/2020    1:00 PM 03/28/2022    6:10 AM  Fall Risk  (RETIRED) Patient Fall Risk Level High fall risk High fall risk High fall risk High fall risk High fall risk   Functional Status Survey:    Vitals:   11/17/22 1046  BP: 122/61  Pulse: (!) 56  Resp: 17  Temp: (!) 97.2 F (36.2 C)  SpO2: 95%   Weight: 138 lb (62.6 kg)  Height: 5\' 7"  (1.702 m)   Body mass index is 21.61 kg/m. Physical Exam Cardiovascular:     Rate and Rhythm: Normal rate.  Pulmonary:     Effort: Pulmonary effort is normal.  Abdominal:     General: Abdomen is flat.  Neurological:     Mental Status: She is alert. Mental status is at baseline.     Labs reviewed: Recent Labs    10/12/22 0127 10/13/22 0132 10/14/22 0157 10/15/22 0112 10/28/22 0000 11/04/22 0000  NA 133* 131* 132* 134* 137  --   K 3.9 3.2* 3.9 3.9 3.3* 3.3*  CL 104 104 105 106 103  --   CO2 20* 20* 20* 21* 27*  --   GLUCOSE 104* 106* 108* 112*  --   --   BUN 18 20 20 17 12   --   CREATININE 0.86 0.74 0.68 0.66  0.6  --   CALCIUM 8.2* 8.1* 8.3* 8.2* 8.2*  --   MG 1.9 2.6* 2.3  --   --   --   PHOS 3.3  --   --   --   --   --    Recent Labs    06/01/22 1238 06/03/22 1117 06/04/22 0416 08/10/22 0000 10/28/22 0000  AST 25 22 17 17 16   ALT 16 13 10 9 19   ALKPHOS 60 51 43 77 58  BILITOT 1.3* 1.5* 1.3*  --   --   PROT 7.0 6.1* 5.2*  --   --   ALBUMIN 3.5 2.9* 2.4* 3.2* 2.7*   Recent Labs    06/03/22 1117 06/03/22 1819 06/14/22 0000 08/10/22 0000 10/11/22 0737 10/12/22 0127 10/15/22 0112 10/28/22 0000  WBC 18.2*   < > 14.2   < > 14.2* 13.9* 14.3* 7.4  NEUTROABS 15.1*  --  9,670.00  --   --   --   --  4,706.00  HGB 11.4*   < > 9.8*   < > 10.5* 10.4* 10.2* 9.2*  HCT 36.0   < > 30*   < > 32.2* 32.6* 31.3* 29*  MCV 100.6*   < >  --    < > 88.7 92.1 87.2  --   PLT 205   < > 308   < > 179 160 191 306   < > = values in this interval not displayed.   Lab Results  Component Value Date   TSH 4.33 06/01/2022   Lab Results  Component Value Date   HGBA1C 5.6 08/26/2022   Lab Results  Component Value Date   CHOL 145 06/01/2022   HDL 58 06/01/2022   LDLCALC 62 06/01/2022   TRIG 55 10/12/2022   CHOLHDL 2.5 04/07/2019    Significant Diagnostic Results in last 30 days:  No results found.  Assessment/Plan Aneurysm of  ascending aorta without rupture (HCC)  Recurrent major depressive disorder, in full remission (HCC)  Malnutrition of moderate degree (HCC)  Dissection of descending thoracic aorta (HCC)  Moderate dementia with mood disturbance, unspecified dementia type (HCC)  Rheumatoid arthritis, involving unspecified site, unspecified whether rheumatoid factor present (HCC)  Peripheral vascular disease (HCC)  Immunosuppression (HCC)  Polymyalgia rheumatica (HCC) Patient readmitted after chest pain found to have dissection of descending aorta and previous hx of AAA w/o rupture. BP below 130/90 typically and PRN hydralazine effective. F/u repeat imaging. Weight stable and adequate appetite. Mood is moderately improved on antidepressant and encourage involvement with activities to help with her mood. RA stable with monthly injections. Will likely have an evaluation with hospice based on imaging of dissection per GOC conversation with daughter.   Family/ staff Communication: nursing  Labs/tests ordered:  CT chest to foll p Dissection previously ordered and scheduled on 8/16.

## 2022-11-18 LAB — BASIC METABOLIC PANEL
BUN: 10 (ref 4–21)
CO2: 29 — AB (ref 13–22)
Chloride: 107 (ref 99–108)
Creatinine: 0.6 (ref 0.5–1.1)
Glucose: 83
Potassium: 3.3 meq/L — AB (ref 3.5–5.1)
Sodium: 143 (ref 137–147)

## 2022-11-18 LAB — COMPREHENSIVE METABOLIC PANEL
Albumin: 3.4 — AB (ref 3.5–5.0)
Calcium: 8.6 — AB (ref 8.7–10.7)
Globulin: 3.5
eGFR: 83

## 2022-11-18 LAB — HEPATIC FUNCTION PANEL
ALT: 11 U/L (ref 7–35)
AST: 16 (ref 13–35)
Alkaline Phosphatase: 76 (ref 25–125)
Bilirubin, Total: 0.6

## 2022-11-19 ENCOUNTER — Ambulatory Visit
Admission: RE | Admit: 2022-11-19 | Discharge: 2022-11-19 | Disposition: A | Discharge status: Home / Self Care | Payer: Medicare PPO | Source: Ambulatory Visit | Attending: Surgery | Admitting: Surgery

## 2022-11-19 DIAGNOSIS — I7121 Aneurysm of the ascending aorta, without rupture: Secondary | ICD-10-CM | POA: Insufficient documentation

## 2022-11-19 MED ORDER — IOHEXOL 350 MG/ML SOLN
80.0000 mL | Freq: Once | INTRAVENOUS | Status: AC | PRN
  Administered 2022-11-19: 80 mL via INTRAVENOUS

## 2022-11-22 ENCOUNTER — Encounter: Payer: Self-pay | Admitting: Student

## 2022-11-29 ENCOUNTER — Encounter: Payer: Self-pay | Admitting: Surgery

## 2022-11-29 ENCOUNTER — Ambulatory Visit (INDEPENDENT_AMBULATORY_CARE_PROVIDER_SITE_OTHER): Payer: Medicare PPO | Admitting: Surgery

## 2022-11-29 DIAGNOSIS — I7121 Aneurysm of the ascending aorta, without rupture: Secondary | ICD-10-CM | POA: Diagnosis not present

## 2022-11-29 DIAGNOSIS — I7123 Aneurysm of the descending thoracic aorta, without rupture: Secondary | ICD-10-CM

## 2022-11-29 NOTE — Progress Notes (Signed)
[facility]  Patient name: Gloria [name] MRN: [id number] DOB: Sep 22, 1925 Sex: female      Virtual Visit via Telephone Note   Because of Gloria [name]'s co-morbid illnesses, she is at least at moderate risk for complications without adequate follow up.  This format is felt to be most appropriate for this patient at this time.  The patient did not have access to video technology/had technical difficulties with video requiring transitioning to audio format only (telephone).  All issues noted in this document were discussed and addressed.  No physical exam could be performed with this format.    Patient Location: Home Provider Location: Office/Clinic     REASON FOR APPOINTMENT:    Follow up  HISTORY OF PRESENT ILLNESS:   Gloria [name] is a 89 y.o. female The patient presented to the emergency department on 04-11-2019 with chest and back pain.  CT angiography identified a intramural hematoma within the descending thoracic aorta with extension into the transverse arch.  She was admitted to the ICU for blood pressure and pain control.  With adequate blood pressure control, she had persistent pain.  A repeat CT scan showed progression of the intramural hematoma and evidence of dissection.  Based on her clinical condition and CT scan progression, repair was necessary.  On 04/11/2019 she underwent endovascular repair without coverage of her left subclavian artery.  Her pain improved and she was discharged home.   In July 2024, she presented with chest pain in the setting of extreme hypertension.  A CT scan showed a new dissection in the descending thoracic aorta extending below the previously placed stents.  She was managed with blood pressure control and pain medication in the ICU and her symptoms resolved and she was ultimately discharged home.  She is here today for follow-up imaging.      PAST MEDICAL HISTORY    Past Medical History:   Diagnosis Date   Actinic keratosis    Arthritis    Basal cell carcinoma    Left chin, right nasolabial   Collagen vascular disease (HCC)    RA   Heartburn    Hypertension    Squamous cell carcinoma of skin 05/06/2021   left hand, treated with EDC     FAMILY HISTORY   Family History  Problem Relation Age of Onset   Kidney disease Neg Hx    Bladder Cancer Neg Hx     SOCIAL HISTORY:   Social History   Socioeconomic History   Marital status: Widowed    Spouse name: Not on file   Number of children: Not on file   Years of education: Not on file   Highest education level: Not on file  Occupational History   Not on file  Tobacco Use   Smoking status: Never   Smokeless tobacco: Never  Vaping Use   Vaping status: Never Used  Substance and Sexual Activity   Alcohol use: No    Alcohol/week: 0.0 standard drinks of alcohol   Drug use: Never   Sexual activity: Not on file  Other Topics Concern   Not on file  Social History Narrative   Not on file   Social Determinants of Health   Financial Resource Strain: Not on file  Food Insecurity: No Food Insecurity (06/03/2022)   Hunger Vital Sign    Worried About Running Out of Food in the Last Year: Never true  Ran Out of Food in the Last Year: Never true  Transportation Needs: No Transportation Needs (06/03/2022)   PRAPARE - Administrator, Civil Service (Medical): No    Lack of Transportation (Non-Medical): No  Physical Activity: Not on file  Stress: Not on file  Social Connections: Not on file  Intimate Partner Violence: Not At Risk (06/03/2022)   Humiliation, Afraid, Rape, and Kick questionnaire    Fear of Current or Ex-Partner: No    Emotionally Abused: No    Physically Abused: No    Sexually Abused: No    ALLERGIES:    Allergies  Allergen Reactions   Amoxicillin Other (See Comments)    Intolerant  Daughter and Son report patient tolerated course of Augmentin in January 2024   Codeine Nausea  Only   Ibandronate Sodium  [Ibandronic Acid] Other (See Comments)    Gi upset   Risedronate Other (See Comments)    Gi upset   Sulfa Antibiotics Hives    CURRENT MEDICATIONS:    Current Outpatient Medications  Medication Sig Dispense Refill   acetaminophen (TYLENOL) 325 MG tablet Take 2 tablets (650 mg total) by mouth every 4 (four) hours as needed for moderate pain.     amLODipine (NORVASC) 10 MG tablet Take 1 tablet (10 mg total) by mouth daily. 30 tablet 1   atorvastatin (LIPITOR) 20 MG tablet Take 20 mg by mouth daily.     Calcium Carbonate (CALCIUM 600 PO) Take 600 mg by mouth in the morning and at bedtime.     carvedilol (COREG) 12.5 MG tablet Take 1 tablet (12.5 mg total) by mouth 2 (two) times daily with a meal. 60 tablet 1   folic acid (FOLVITE) 1 MG tablet Take 1 mg by mouth daily.     hydrALAZINE (APRESOLINE) 10 MG tablet Take 1 tablet (10 mg total) by mouth daily as needed (for SBP > 130). 30 tablet 0   levothyroxine (SYNTHROID, LEVOTHROID) 75 MCG tablet Take 75 mcg by mouth at bedtime.  11   losartan (COZAAR) 100 MG tablet Take 1 tablet (100 mg total) by mouth daily. 30 tablet 1   methotrexate (RHEUMATREX) 2.5 MG tablet Take 15 mg by mouth every Friday.     Multiple Vitamin (MULTIVITAMIN WITH MINERALS) TABS tablet Take 1 tablet by mouth daily.     nitrofurantoin, macrocrystal-monohydrate, (MACROBID) 100 MG capsule Take 1 capsule (100 mg total) by mouth daily. 30 capsule 11   predniSONE (DELTASONE) 2.5 MG tablet Take 2.5 mg by mouth daily.     Propylene Glycol (SYSTANE BALANCE OP) Place 1 drop into both eyes in the morning, at noon, and at bedtime. PRN order: Instill one drop in both eyes every 12 hours as needed for dry eyes.     senna (SENOKOT) 8.6 MG TABS tablet Take 1 tablet by mouth daily.     sertraline (ZOLOFT) 50 MG tablet Take 50 mg by mouth at bedtime.     triamcinolone cream (KENALOG) 0.1 % Apply 1 Application topically as directed. Apply to trunk, arms and legs 3  days a week Monday, Wednesday, Friday, for atopic dermatitis and stasis dermatitis, avoid face, groin, axilla 453 g 1   No current facility-administered medications for this visit.    REVIEW OF SYSTEMS:   See HPI, otherwise negative  PHYSICAL EXAM:      Recent Labs: 06/01/2022: TSH 4.33 06/04/2022: B Natriuretic Peptide 60.0 10/14/2022: Magnesium 2.3 10/28/2022: ALT 19; BUN 12; Creatinine 0.6; Hemoglobin 9.2; Platelets 306; Sodium  137 11/04/2022: Potassium 3.3   Recent Lipid Panel Lab Results  Component Value Date/Time   CHOL 145 06/01/2022 12:00 AM   TRIG 55 10/12/2022 01:28 AM   HDL 58 06/01/2022 12:00 AM   CHOLHDL 2.5 04/07/2019 02:19 AM   LDLCALC 62 06/01/2022 12:00 AM    Wt Readings from Last 3 Encounters:  11/17/22 138 lb (62.6 kg)  11/10/22 136 lb 9.6 oz (62 kg)  10/18/22 140 lb 9.6 oz (63.8 kg)     STUDIES:   I have reviewed the following: 1. Improving/healing Stanford type B thoracic aortic dissection status post endovascular repair. Decreasing visibility of residual dissection flap in the distal most descending thoracic aorta and supra visceral abdominal aorta with decreased retrograde filling of the false lumen. 2. Well-positioned thoracic endograft without evidence of complication. 3. Similar to slightly enlarged ascending thoracic aortic aneurysm at 4.7 cm compared to 4.5 cm previously. The degree of change is slightly greater than expected for the given time frame. Recommend semi-annual imaging followup by CTA or MRA and referral to cardiothoracic surgery if not already obtained. This recommendation follows 2010 ACCF/AHA/AATS/ACR/ASA/SCA/SCAI/SIR/STS/SVM Guidelines for the Diagnosis and Management of Patients With Thoracic Aortic Disease. Circulation. 2010; 121: G956-O130. Aortic aneurysm NOS (ICD10-I71.9) 4. 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 5. Small left pleural effusion. 6. Additional ancillary findings as above  without significant interval change from recent prior imaging.  ASSESSMENT and PLAN   Type B aortic dissection:  This was initially treated in 2021. She returned with new onset back pain in the setting of hypertension and and pain.  This is her 6-week post event scan which does not show any significant interval change.  Follow-up phone.  DNR dementia and we are questioning the utility in continuing to monitor this as the patient likely will not end up having another operation.  I discussed that this is very reasonable.  We will go ahead and schedule a 48-month CT scan with follow-up however when the time comes that family may choose to not get this done which I said was totally appropriate given her and comorbidities.    Time:   Today, I have spent 25 minutes with the patient with telehealth technology discussing the above problems.        Charlena Cross, MD, FACS [facility] [phone] Pager [phone]

## 2022-12-08 ENCOUNTER — Encounter: Payer: Self-pay | Admitting: Student

## 2022-12-08 ENCOUNTER — Non-Acute Institutional Stay (SKILLED_NURSING_FACILITY): Payer: Medicare PPO | Admitting: Student

## 2022-12-08 DIAGNOSIS — F03B3 Unspecified dementia, moderate, with mood disturbance: Secondary | ICD-10-CM | POA: Diagnosis not present

## 2022-12-08 DIAGNOSIS — I10 Essential (primary) hypertension: Secondary | ICD-10-CM

## 2022-12-08 DIAGNOSIS — I7121 Aneurysm of the ascending aorta, without rupture: Secondary | ICD-10-CM

## 2022-12-08 DIAGNOSIS — R296 Repeated falls: Secondary | ICD-10-CM | POA: Diagnosis not present

## 2022-12-08 NOTE — Progress Notes (Signed)
Location:  Other [facility].  Nursing Home Room Number: [facility] Place of Service:  SNF [id number]) Provider:  Earnestine Mealing, MD  Patient Care Team: Earnestine Mealing, MD as PCP - General (Family Medicine)  Extended Emergency Contact Information Primary Emergency Contact: Verna Czech of [place] Home Phone: [phone] Relation: Daughter Secondary Emergency Contact: Aurelio Jew Home Phone: [phone] Mobile Phone: [phone] Relation: Son  Code Status:  DNR Goals of care: Advanced Directive information    12/08/2022   10:12 AM  Advanced Directives  Does Patient Have a Medical Advance Directive? Yes  Type of Advance Directive Out of facility DNR (pink MOST or yellow form)  Does patient want to make changes to medical advance directive? No - Patient declined     Chief Complaint  Patient presents with   Acute Visit    Fall    HPI:  Pt is a 89 y.o. female seen today for an acute visit for Fall.   Patient's story has changed and is inconsistent given her history of dementia. She does state she fell and hit her head on the wall in different iterations of the story - which is likely true. Unwitnessed fall. She walked 3-4 laps today even with the facial swelling and hand injury.   Spoke with her daughter and Avon Gully that patient is able to perform ROM and does not want to take her to orthopedic surgery today. Agree with this sentiment given GOC. BP Elevated, paitent required PRN dose of hydralazine.   Past Medical History:  Diagnosis Date   Actinic keratosis    Arthritis    Basal cell carcinoma    Left chin, right nasolabial   Collagen vascular disease (HCC)    RA   Heartburn    Hypertension    Squamous cell carcinoma of skin 05/06/2021   left hand, treated with [facility]   Past Surgical History:  Procedure Laterality Date   ABDOMINAL HYSTERECTOMY     BREAST CYST EXCISION     HIP ARTHROPLASTY Right 11/30/2019   Procedure: ARTHROPLASTY BIPOLAR  HIP (HEMIARTHROPLASTY);  Surgeon: Christena Flake, MD;  Location: [facility] ORS;  Service: Orthopedics;  Laterality: Right;   SACROPLASTY N/A 12/17/2020   Procedure: SACROPLASTY;  Surgeon: Kennedy Bucker, MD;  Location: [facility] ORS;  Service: Orthopedics;  Laterality: N/A;   THORACIC AORTIC ENDOVASCULAR STENT GRAFT N/A 04/11/2019   Procedure: THORACIC AORTIC ENDOVASCULAR STENT GRAFT insertion;  Surgeon: Nada Libman, MD;  Location: [facility] OR;  Service: Vascular;  Laterality: N/A;   ULTRASOUND GUIDANCE FOR VASCULAR ACCESS Right 04/11/2019   Procedure: Ultrasound Guidance For Vascular Access, right femoral artery;  Surgeon: Nada Libman, MD;  Location: [facility] OR;  Service: Vascular;  Laterality: Right;    Allergies  Allergen Reactions   Amoxicillin Other (See Comments)    Intolerant  Daughter and Son report patient tolerated course of Augmentin in January 2024   Codeine Nausea Only   Ibandronate Sodium  [Ibandronic Acid] Other (See Comments)    Gi upset   Risedronate Other (See Comments)    Gi upset   Sulfa Antibiotics Hives    Outpatient Encounter Medications as of 12/08/2022  Medication Sig   acetaminophen (TYLENOL) 325 MG tablet Take 2 tablets (650 mg total) by mouth every 4 (four) hours as needed for moderate pain.   amLODipine (NORVASC) 10 MG tablet Take 1 tablet (10 mg total) by mouth daily.   atorvastatin (LIPITOR) 20 MG tablet Take 20 mg by mouth daily.   Calcium  Carbonate (CALCIUM 600 PO) Take 600 mg by mouth in the morning and at bedtime.   carvedilol (COREG) 12.5 MG tablet Take 1 tablet (12.5 mg total) by mouth 2 (two) times daily with a meal.   folic acid (FOLVITE) 1 MG tablet Take 1 mg by mouth daily.   hydrALAZINE (APRESOLINE) 10 MG tablet Take 1 tablet (10 mg total) by mouth daily as needed (for SBP > 130).   levothyroxine (SYNTHROID, LEVOTHROID) 75 MCG tablet Take 75 mcg by mouth at bedtime.   losartan (COZAAR) 100 MG tablet Take 1 tablet (100 mg total) by mouth daily.   methotrexate  (RHEUMATREX) 2.5 MG tablet Take 15 mg by mouth every Friday.   Multiple Vitamin (MULTIVITAMIN WITH MINERALS) TABS tablet Take 1 tablet by mouth daily.   nitrofurantoin, macrocrystal-monohydrate, (MACROBID) 100 MG capsule Take 1 capsule (100 mg total) by mouth daily.   predniSONE (DELTASONE) 2.5 MG tablet Take 2.5 mg by mouth daily.   Propylene Glycol (SYSTANE BALANCE OP) Place 1 drop into both eyes in the morning, at noon, and at bedtime. PRN order: Instill one drop in both eyes every 12 hours as needed for dry eyes.   senna (SENOKOT) 8.6 MG TABS tablet Take 1 tablet by mouth daily.   sertraline (ZOLOFT) 50 MG tablet Take 100 mg by mouth at bedtime.   triamcinolone cream (KENALOG) 0.1 % Apply 1 Application topically as directed. Apply to trunk, arms and legs 3 days a week Monday, Wednesday, Friday, for atopic dermatitis and stasis dermatitis, avoid face, groin, axilla   No facility-administered encounter medications on file as of 12/08/2022.    Review of Systems  Immunization History  Administered Date(s) Administered   Influenza Split 01/21/2014, 12/04/2015   Influenza, High Dose Seasonal PF 01/31/2017, 12/18/2018   Influenza-Unspecified 01/27/2015, 12/18/2018, 01/01/2020, 01/05/2021, 01/19/2022   Moderna Covid-19 Vaccine Bivalent Booster 29yrs & up 09/01/2021   Moderna Sars-Covid-2 Vaccination 04/23/2019, 05/21/2019, 02/19/2020, 07/11/2020, 01/29/2021   Pneumococcal Conjugate-13 03/15/2014   Pneumococcal Polysaccharide-23 04/05/2001, 08/13/2016   RSV,unspecified 01/19/2022   Td 07/04/2005   Td (Adult),5 Lf Tetanus Toxid, Preservative Free 02/15/2017   Tdap 07/27/2005, 02/15/2017   Zoster Recombinant(Shingrix) 12/29/2018, 06/10/2019   Zoster, Live 04/05/2006   Pertinent  Health Maintenance Due  Topic Date Due   DEXA SCAN  Never done   INFLUENZA VACCINE  11/04/2022      12/17/2020    9:00 AM 12/17/2020    2:42 PM 12/18/2020    2:00 AM 12/18/2020    1:00 PM 03/28/2022    6:10 AM   Fall Risk  (RETIRED) Patient Fall Risk Level High fall risk High fall risk High fall risk High fall risk High fall risk   Functional Status Survey:    Vitals:   12/08/22 1005 12/08/22 1015  BP: (!) 191/68 (!) 140/64  Pulse: 65   Resp: 20   Temp: (!) 97.2 F (36.2 C)   SpO2: 95%   Weight: 136 lb 12.8 oz (62.1 kg)   Height: 5\' 7"  (1.702 m)    Body mass index is 21.43 kg/m. Physical Exam HENT:     Head:     Comments: Right facial swelling with ecchymosis and hemostatic laceration Eyes:     Extraocular Movements: Extraocular movements intact.     Pupils: Pupils are equal, round, and reactive to light.     Comments: Visual fields intact  Cardiovascular:     Rate and Rhythm: Normal rate.     Pulses: Normal pulses.  Pulmonary:  Effort: Pulmonary effort is normal.  Musculoskeletal:     Comments: Right writst dorsal swelling, no bruising, nontender to palpation. 5/5 grip strength.   Neurological:     Mental Status: She is alert. Mental status is at baseline.     Labs reviewed: Recent Labs    10/12/22 0127 10/13/22 0132 10/14/22 0157 10/15/22 0112 10/28/22 0000 11/04/22 0000 11/18/22 0000  NA 133* 131* 132* 134* 137  --  143  K 3.9 3.2* 3.9 3.9 3.3* 3.3* 3.3*  CL 104 104 105 106 103  --  107  CO2 20* 20* 20* 21* 27*  --  29*  GLUCOSE 104* 106* 108* 112*  --   --   --   BUN 18 20 20 17 12   --  10  CREATININE 0.86 0.74 0.68 0.66 0.6  --  0.6  CALCIUM 8.2* 8.1* 8.3* 8.2* 8.2*  --  8.6*  MG 1.9 2.6* 2.3  --   --   --   --   PHOS 3.3  --   --   --   --   --   --    Recent Labs    06/01/22 1238 06/03/22 1117 06/04/22 0416 08/10/22 0000 10/28/22 0000 11/18/22 0000  AST 25 22 17 17 16 16   ALT 16 13 10 9 19 11   ALKPHOS 60 51 43 77 58 76  BILITOT 1.3* 1.5* 1.3*  --   --   --   PROT 7.0 6.1* 5.2*  --   --   --   ALBUMIN 3.5 2.9* 2.4* 3.2* 2.7* 3.4*   Recent Labs    06/03/22 1117 06/03/22 1819 06/14/22 0000 08/10/22 0000 10/11/22 0737 10/12/22 0127  10/15/22 0112 10/28/22 0000  WBC 18.2*   < > 14.2   < > 14.2* 13.9* 14.3* 7.4  NEUTROABS 15.1*  --  9,670.00  --   --   --   --  4,706.00  HGB 11.4*   < > 9.8*   < > 10.5* 10.4* 10.2* 9.2*  HCT 36.0   < > 30*   < > 32.2* 32.6* 31.3* 29*  MCV 100.6*   < >  --    < > 88.7 92.1 87.2  --   PLT 205   < > 308   < > 179 160 191 306   < > = values in this interval not displayed.   Lab Results  Component Value Date   TSH 4.33 06/01/2022   Lab Results  Component Value Date   HGBA1C 5.6 08/26/2022   Lab Results  Component Value Date   CHOL 145 06/01/2022   HDL 58 06/01/2022   LDLCALC 62 06/01/2022   TRIG 55 10/12/2022   CHOLHDL 2.5 04/07/2019    Significant Diagnostic Results in last 30 days:  CT ANGIO CHEST/ABD/PEL FOR DISSECTION W &/OR WO CONTRAST  Result Date: 11/26/2022 CLINICAL DATA:  Ascending aortic aneurysm; recent thoracic aortic dissection status post TEVAR EXAM: CT ANGIOGRAPHY CHEST, ABDOMEN AND PELVIS TECHNIQUE: Non-contrast CT of the chest was initially obtained. Multidetector CT imaging through the chest, abdomen and pelvis was performed using the standard protocol during bolus administration of intravenous contrast. Multiplanar reconstructed images and MIPs were obtained and reviewed to evaluate the vascular anatomy. RADIATION DOSE REDUCTION: This exam was performed according to the departmental dose-optimization program which includes automated exposure control, adjustment of the mA and/or kV according to patient size and/or use of iterative reconstruction technique. CONTRAST:  80mL OMNIPAQUE IOHEXOL 350 MG/ML  SOLN COMPARISON:  Multiple recent prior CTA of the chest, abdomen and pelvis including 10/12/2022 head 10/09/2022 FINDINGS: CTA CHEST FINDINGS Cardiovascular: Conventional 3 vessel arch anatomy. The aortic root is normal in caliber at 3.4 cm measured at the sinuses of Valsalva. Aneurysmal dilation of the ascending thoracic aorta is present. The aorta is slightly enlarged at  4.7 cm compared to 4.5 cm in July. Thickened aortic valve. Stable cardiomegaly. Thoracic stent graft beginning just beyond the origin of the left subclavian artery and extending throughout the descending thoracic aorta nearly completely covering the dissection flap. A small amount of residual dissection flap is present in the distal most descending thoracic aorta extending into the supra visceral abdominal aorta. The dissection flap has significantly decreased in size. There is less retrograde filling of the false lumen. Mediastinum/Nodes: Unremarkable CT appearance of the thyroid gland. No suspicious mediastinal or hilar adenopathy. No soft tissue mediastinal mass. The thoracic esophagus is unremarkable. Lungs/Pleura: Small left pleural effusion. Bilateral lower lobe dependent atelectasis. Musculoskeletal: No acute fracture or aggressive appearing lytic or blastic osseous lesion. Review of the MIP images confirms the above findings. CTA ABDOMEN AND PELVIS FINDINGS VASCULAR Aorta: Improving dissection flap in the super visceral aorta which measures up to 2.8 cm, unchanged compared to prior. 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 Minimal visible atherosclerotic plaque. This appears to be primarily due to median arcuate ligament compression and mild fibrofatty plaque. Further, there is focal aneurysmal dilation of the common hepatic artery proximally with a maximal diameter of 1 cm. SMA: Patent without evidence of aneurysm, dissection, vasculitis or significant stenosis. Renals: Accessory artery to the lower pole of the right kidney arises distally from the aorta. Mildly beaded appearance of the renal arteries bilaterally may reflect mild FMD or atherosclerotic plaque. The imaging appearance is not clear. IMA: Patent without evidence of aneurysm, dissection, vasculitis or significant stenosis. Inflow: Patent without evidence of aneurysm, dissection,  vasculitis or significant stenosis. Veins: No focal venous abnormality. Review of the MIP images confirms the above findings. NON-VASCULAR Hepatobiliary: Normal hepatic contour morphology. No discrete hepatic lesion. Small stones layer in the gallbladder lumen. Pancreas: Unremarkable. No pancreatic ductal dilatation or surrounding inflammatory changes. Spleen: No splenic injury or perisplenic hematoma. Adrenals/Urinary Tract: Adrenal glands are unremarkable. Kidneys are normal, without renal calculi, focal lesion, or hydronephrosis. Bladder is unremarkable. Stomach/Bowel: No focal bowel wall thickening or evidence of obstruction. Lymphatic: No suspicious lymphadenopathy. Reproductive: Status post hysterectomy. No adnexal masses. Other: No abdominal wall hernia or abnormality. No abdominopelvic ascites. Musculoskeletal: No acute fracture or aggressive appearing lytic or blastic osseous lesion. L5-S1 degenerative disc disease. Bilateral lower lumbar facet arthropathy. Schmorl's node versus mild chronic compression fracture of the superior endplate of L1. Review of the MIP images confirms the above findings. IMPRESSION: 1. Improving/healing Stanford type B thoracic aortic dissection status post endovascular repair. Decreasing visibility of residual dissection flap in the distal most descending thoracic aorta and supra visceral abdominal aorta with decreased retrograde filling of the false lumen. 2. Well-positioned thoracic endograft without evidence of complication. 3. Similar to slightly enlarged ascending thoracic aortic aneurysm at 4.7 cm compared to 4.5 cm previously. The degree of change is slightly greater than expected for the given time frame. Recommend semi-annual imaging followup by CTA or MRA and referral to cardiothoracic surgery if not already obtained. This recommendation follows 2010 ACCF/AHA/AATS/ACR/ASA/SCA/SCAI/SIR/STS/SVM Guidelines for the Diagnosis and Management of Patients With Thoracic Aortic  Disease. Circulation. 2010; 121: T732-K025.  Aortic aneurysm NOS (ICD10-I71.9) 4. 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 5. Small left pleural effusion. 6. Additional ancillary findings as above without significant interval change from recent prior imaging. Electronically Signed   By: Malachy Moan M.D.   On: 11/26/2022 07:33    Assessment/Plan Recurrent falls  Moderate dementia with mood disturbance, unspecified dementia type (HCC)  Aneurysm of ascending aorta without rupture [facility])  Essential hypertension Patient with history of multiple falls.  Notable facial swelling at this time, visual fields intact.*Ice every 4 hours as needed for facial swelling.  Right wrist slightly swollen tender to palpation.  No bruising and full range of motion with normal strength, will defer orthopedic evaluation based on family and patient preference.  Plan for x-ray in the facility and evaluate accordingly with specialist.  BP elevated potentially due to pain as needed medications administered As indicated for BP greater than 135.   Family/ staff Communication: daughter, HCPOA, nursing  Labs/tests ordered:  none

## 2022-12-09 LAB — LIPID PANEL
Cholesterol: 150 (ref 0–200)
HDL: 75 — AB (ref 35–70)
LDL Cholesterol: 58
Triglycerides: 90 (ref 40–160)

## 2022-12-09 LAB — BASIC METABOLIC PANEL: Potassium: 3.3 meq/L — AB (ref 3.5–5.1)

## 2022-12-15 ENCOUNTER — Emergency Department
Admission: EM | Admit: 2022-12-15 | Discharge: 2022-12-15 | Disposition: A | Discharge status: Home / Self Care | Payer: Medicare PPO | Attending: Emergency Medicine | Admitting: Emergency Medicine

## 2022-12-15 ENCOUNTER — Emergency Department: Payer: Medicare PPO

## 2022-12-15 DIAGNOSIS — S01112A Laceration without foreign body of left eyelid and periocular area, initial encounter: Secondary | ICD-10-CM | POA: Insufficient documentation

## 2022-12-15 DIAGNOSIS — W19XXXA Unspecified fall, initial encounter: Secondary | ICD-10-CM

## 2022-12-15 DIAGNOSIS — W0110XA Fall on same level from slipping, tripping and stumbling with subsequent striking against unspecified object, initial encounter: Secondary | ICD-10-CM | POA: Insufficient documentation

## 2022-12-15 DIAGNOSIS — S0083XA Contusion of other part of head, initial encounter: Secondary | ICD-10-CM

## 2022-12-15 DIAGNOSIS — S0101XA Laceration without foreign body of scalp, initial encounter: Secondary | ICD-10-CM | POA: Diagnosis not present

## 2022-12-15 DIAGNOSIS — S0990XA Unspecified injury of head, initial encounter: Secondary | ICD-10-CM

## 2022-12-15 DIAGNOSIS — I10 Essential (primary) hypertension: Secondary | ICD-10-CM | POA: Insufficient documentation

## 2022-12-15 DIAGNOSIS — R519 Headache, unspecified: Secondary | ICD-10-CM | POA: Diagnosis present

## 2022-12-15 LAB — CBC
HCT: 31.7 % — ABNORMAL LOW (ref 36.0–46.0)
Hemoglobin: 10 g/dL — ABNORMAL LOW (ref 12.0–15.0)
MCH: 29.2 pg (ref 26.0–34.0)
MCHC: 31.5 g/dL (ref 30.0–36.0)
MCV: 92.4 fL (ref 80.0–100.0)
Platelets: 205 10*3/uL (ref 150–400)
RBC: 3.43 MIL/uL — ABNORMAL LOW (ref 3.87–5.11)
RDW: 19.7 % — ABNORMAL HIGH (ref 11.5–15.5)
WBC: 9.6 10*3/uL (ref 4.0–10.5)
nRBC: 0 % (ref 0.0–0.2)

## 2022-12-15 LAB — BASIC METABOLIC PANEL
Anion gap: 10 (ref 5–15)
BUN: 18 mg/dL (ref 8–23)
CO2: 22 mmol/L (ref 22–32)
Calcium: 8.6 mg/dL — ABNORMAL LOW (ref 8.9–10.3)
Chloride: 105 mmol/L (ref 98–111)
Creatinine, Ser: 0.56 mg/dL (ref 0.44–1.00)
GFR, Estimated: >60 mL/min (ref >60)
Glucose, Bld: 110 mg/dL — ABNORMAL HIGH (ref 70–99)
Potassium: 3.8 mmol/L (ref 3.5–5.1)
Sodium: 137 mmol/L (ref 135–145)

## 2022-12-15 MED ORDER — LIDOCAINE-EPINEPHRINE 1 %-1:100000 IJ SOLN
20.0000 mL | Freq: Once | INTRAMUSCULAR | Status: AC
  Administered 2022-12-15: 20 mL
  Filled 2022-12-15: qty 1

## 2022-12-15 MED ORDER — MELATONIN 5 MG PO TABS
5.0000 mg | ORAL_TABLET | Freq: Every day | ORAL | Status: AC
  Administered 2022-12-15: 5 mg via ORAL
  Filled 2022-12-15: qty 1

## 2022-12-15 NOTE — ED Triage Notes (Signed)
Pt arrives via [facility] from [facility] for unwitnessed fall. Pt had fall from standing, striking her head potentially on a table. Pt denies LOC. Lacerations above her L eye and posterior head as well as skin abrasions to her arm. Pt not on a blood thinner. Pt alert and oriented to person, place, and situation.

## 2022-12-15 NOTE — ED Notes (Signed)
Pt refused VS prior to DC

## 2022-12-15 NOTE — Discharge Instructions (Addendum)
Please keep your wound clean by washing at least daily with soap and water.  Apply antibiotic ointment and a Band-Aid. If you see any signs of infection like spreading redness, pus coming from the wound, extreme pain, fevers, chills or any other worsening doctor right away or come back to the emergency department.  See your doctor or return to the emergency department in 7 days for both the stitches and staples to be removed.  See the attached documents regarding head injuries and how to rest your brain and avoid strenuous mental or physical activities especially any thing that might put you at risk for reinjuring your head while you are healing.  Take Tylenol 650 mg every 6 hours as needed for pain.  Thank you for choosing [place] for your health care today!  Please see your primary doctor this week for a follow up appointment.   If you have any new, worsening, or unexpected symptoms call your doctor right away or come back to the emergency department for reevaluation.  It was my pleasure to care for you today.   Daneil Dan Modesto Charon, MD

## 2022-12-15 NOTE — ED Provider Notes (Signed)
[facility] Provider Note    Event Date/Time   First MD Initiated Contact with Patient 12/15/22 2110     (approximate)   History   Fall   HPI  Gloria [name] is a 89 y.o. female   Past medical history of hypertension who presents to the emergency department with a fall.  She lives at a [place] facility and her son was visiting for dinner.  She stood up to place a sweater behind her chair, and usually walks with a walker both but did not do this with a walker and fell.  She denies any presyncopal symptoms but cannot state whether she slipped and fell or not.  She has a headache after her fall.  She did strike her head and sustained 2 lacerations to the left eyebrow and the left side of her scalp.  He was able to stand afterwards and bear weight.  Reports no other recent illnesses has otherwise been in regular state of health.   Independent Historian contributed to assessment above: Son is at bedside to corroborate information as above and past medical history.     Physical Exam   Triage Vital Signs: ED Triage Vitals  Encounter Vitals Group     BP 12/15/22 1903 (!) 166/68     Systolic BP Percentile --      Diastolic BP Percentile --      Pulse Rate 12/15/22 1903 68     Resp 12/15/22 1903 20     Temp 12/15/22 1903 98 F (36.7 C)     Temp Source 12/15/22 1903 Oral     SpO2 12/15/22 1903 96 %     Weight --      Height --      Head Circumference --      Peak Flow --      Pain Score 12/15/22 1857 8     Pain Loc --      Pain Education --      Exclude from Growth Chart --     Most recent vital signs: Vitals:   12/15/22 1903  BP: (!) 166/68  Pulse: 68  Resp: 20  Temp: 98 F (36.7 C)  SpO2: 96%    General: Awake, no distress.  CV:  Good peripheral perfusion.  Resp:  Normal effort.  Abd:  No distention.  Other:  Pleasant woman in no acute distress.  There is approximately 3 cm laceration both to the left side of the frontal part of  her scalp and across her left eyebrow.  There is bruising around the left eye but no proptosis and extraocular movements are intact.  There is no bony tenderness to the remainder of her face, no malocclusion, and assessment of her bilateral upper and lower extremity shows no bony tenderness, full range of motion, and no evidence of thoracic or intra-abdominal injuries or T or L-spine injury.   ED Results / Procedures / Treatments   Labs (all labs ordered are listed, but only abnormal results are displayed) Labs Reviewed  BASIC METABOLIC PANEL - Abnormal; Notable for the following components:      Result Value   Glucose, Bld 110 (*)    Calcium 8.6 (*)    All other components within normal limits  CBC - Abnormal; Notable for the following components:   RBC 3.43 (*)    Hemoglobin 10.0 (*)    HCT 31.7 (*)    RDW 19.7 (*)    All other components within normal  limits     I ordered and reviewed the above labs they are notable for H&H is at baseline  EKG  ED ECG REPORT I, Pilar Jarvis, the attending physician, personally viewed and interpreted this ECG.   Date: 12/15/2022  EKG Time: 1922  Rate: 68  Rhythm: sinus  Axis: nl  Intervals:lbbb 1st deg avblock  ST&T Change: no stemi    RADIOLOGY I independently reviewed and interpreted CT scan of the head see no obvious bleeding or midline shift I also reviewed radiologist's formal read.   PROCEDURES:  Critical Care performed: No  ..Laceration Repair  Date/Time: 12/15/2022 11:24 PM  Performed by: Pilar Jarvis, MD Authorized by: Pilar Jarvis, MD   Consent:    Consent obtained:  Verbal   Consent given by:  Patient   Risks, benefits, and alternatives were discussed: yes     Risks discussed:  Infection, need for additional repair, nerve damage and poor wound healing   Alternatives discussed:  No treatment Universal protocol:    Procedure explained and questions answered to patient or proxy's satisfaction: yes     Patient identity  confirmed:  Verbally with patient Anesthesia:    Anesthesia method:  Local infiltration   Local anesthetic:  Lidocaine 1% WITH epi Laceration details:    Location:  Scalp   Scalp location:  Frontal   Length (cm):  3   Depth (mm):  3 Pre-procedure details:    Preparation:  Patient was prepped and draped in usual sterile fashion Exploration:    Limited defect created (wound extended): no     Wound exploration: wound explored through full range of motion     Wound extent: no foreign body   Treatment:    Area cleansed with:  Saline   Irrigation solution:  Sterile water   Irrigation method:  Syringe Skin repair:    Repair method:  Staples   Number of staples:  3 Approximation:    Approximation:  Close Repair type:    Repair type:  Simple Post-procedure details:    Dressing:  Open (no dressing)   Procedure completion:  Tolerated .Marland KitchenLaceration Repair  Date/Time: 12/15/2022 11:25 PM  Performed by: Pilar Jarvis, MD Authorized by: Pilar Jarvis, MD   Consent:    Consent obtained:  Verbal   Consent given by:  Patient   Risks, benefits, and alternatives were discussed: yes     Risks discussed:  Pain, poor cosmetic result, need for additional repair and infection Universal protocol:    Procedure explained and questions answered to patient or proxy's satisfaction: yes     Patient identity confirmed:  Verbally with patient Anesthesia:    Anesthesia method:  Local infiltration   Local anesthetic:  Lidocaine 1% WITH epi Laceration details:    Location:  Face   Face location:  L eyebrow   Length (cm):  3   Depth (mm):  3 Exploration:    Wound exploration: wound explored through full range of motion     Wound extent: no foreign body   Treatment:    Area cleansed with:  Saline   Amount of cleaning:  Standard   Irrigation solution:  Tap water   Irrigation method:  Syringe   Debridement:  None   Undermining:  None Skin repair:    Repair method:  Sutures   Suture size:  5-0    Suture material:  Nylon   Suture technique:  Simple interrupted   Number of sutures:  4 Approximation:    Approximation:  Close Repair type:    Repair type:  Simple Post-procedure details:    Dressing:  Adhesive bandage   Procedure completion:  Tolerated    MEDICATIONS ORDERED IN ED: Medications  lidocaine-EPINEPHrine (XYLOCAINE W/EPI) 1 %-1:100000 (with pres) injection 20 mL (20 mLs Other Given by Other 12/15/22 2251)  melatonin tablet 5 mg (5 mg Oral Given 12/15/22 2319)     IMPRESSION / MDM / ASSESSMENT AND PLAN / ED COURSE  I reviewed the triage vital signs and the nursing notes.                                Patient's presentation is most consistent with acute presentation with potential threat to life or bodily function.  Differential diagnosis includes, but is not limited to, blunt traumatic injury including laceration, skull fracture, C-spine fracture dislocation, ICH, dysrhythmia, infection, dehydration or metabolic derangement, mechanical slip and fall   The patient is on the cardiac monitor to evaluate for evidence of arrhythmia and/or significant heart rate changes.  MDM:    Patient in regular state of health with most likely mechanical slip and fall as she got up from dinner without her walker and fell.  No presyncopal symptoms otherwise in her regular state of health doubt a medical reason for her fall.  She did sustain 2 lacerations were repaired as above and CT imaging of the head and neck were fortunately unremarkable.  No other signs of acute injury on my exam, she has been weightbearing and is eager to go home.  She will be discharged      FINAL CLINICAL IMPRESSION(S) / ED DIAGNOSES   Final diagnoses:  Fall, initial encounter  Injury of head, initial encounter  Laceration of scalp, initial encounter  Contusion of face, initial encounter     Rx / DC Orders   ED Discharge Orders     None        Note:  This document was prepared using Dragon  voice recognition software and may include unintentional dictation errors.    Pilar Jarvis, MD 12/15/22 [phone]

## 2022-12-20 ENCOUNTER — Non-Acute Institutional Stay (SKILLED_NURSING_FACILITY): Payer: Medicare PPO | Admitting: Student

## 2022-12-20 ENCOUNTER — Encounter: Payer: Self-pay | Admitting: Student

## 2022-12-20 DIAGNOSIS — R296 Repeated falls: Secondary | ICD-10-CM

## 2022-12-20 DIAGNOSIS — E440 Moderate protein-calorie malnutrition: Secondary | ICD-10-CM

## 2022-12-20 DIAGNOSIS — F331 Major depressive disorder, recurrent, moderate: Secondary | ICD-10-CM | POA: Diagnosis not present

## 2022-12-20 DIAGNOSIS — D849 Immunodeficiency, unspecified: Secondary | ICD-10-CM

## 2022-12-20 DIAGNOSIS — M069 Rheumatoid arthritis, unspecified: Secondary | ICD-10-CM

## 2022-12-20 DIAGNOSIS — F03B3 Unspecified dementia, moderate, with mood disturbance: Secondary | ICD-10-CM

## 2022-12-20 DIAGNOSIS — I739 Peripheral vascular disease, unspecified: Secondary | ICD-10-CM

## 2022-12-20 NOTE — Progress Notes (Unsigned)
Location:  Other [place].  Nursing Home Room Number: [facility] [address] Place of Service:  SNF [phone]) Provider:  Earnestine Mealing, MD  Patient Care Team: Earnestine Mealing, MD as PCP - General (Family Medicine)  Extended Emergency Contact Information Primary Emergency Contact: Verna Czech of [place] Home Phone: [phone] Relation: Daughter Secondary Emergency Contact: Aurelio Jew Home Phone: [phone] Mobile Phone: [phone] Relation: Son  Code Status:  DNR Goals of care: Advanced Directive information    12/20/2022    2:10 PM  Advanced Directives  Does Patient Have a Medical Advance Directive? Yes  Type of Advance Directive Out of facility DNR (pink MOST or yellow form)  Does patient want to make changes to medical advance directive? No - Patient declined     Chief Complaint  Patient presents with   Acute Visit    ER Follow up    HPI:  Pt is a 89 y.o. female seen today for an acute visit for ER Follow up  Patient had an unwitnessed fall and hit her head requring stitches on her face and staples on her scalp.  She is confused about where she is, why she is here, and how long she will be here. She doesn't remember the fall.    Past Medical History:  Diagnosis Date   Actinic keratosis    Arthritis    Basal cell carcinoma    Left chin, right nasolabial   Collagen vascular disease (HCC)    RA   Heartburn    Hypertension    Squamous cell carcinoma of skin 05/06/2021   left hand, treated with [facility]   Past Surgical History:  Procedure Laterality Date   ABDOMINAL HYSTERECTOMY     BREAST CYST EXCISION     HIP ARTHROPLASTY Right 11/30/2019   Procedure: ARTHROPLASTY BIPOLAR HIP (HEMIARTHROPLASTY);  Surgeon: Christena Flake, MD;  Location: [facility] ORS;  Service: Orthopedics;  Laterality: Right;   SACROPLASTY N/A 12/17/2020   Procedure: SACROPLASTY;  Surgeon: Kennedy Bucker, MD;  Location: [facility] ORS;  Service: Orthopedics;  Laterality: N/A;    THORACIC AORTIC ENDOVASCULAR STENT GRAFT N/A 04/11/2019   Procedure: THORACIC AORTIC ENDOVASCULAR STENT GRAFT insertion;  Surgeon: Nada Libman, MD;  Location: [facility] OR;  Service: Vascular;  Laterality: N/A;   ULTRASOUND GUIDANCE FOR VASCULAR ACCESS Right 04/11/2019   Procedure: Ultrasound Guidance For Vascular Access, right femoral artery;  Surgeon: Nada Libman, MD;  Location: [facility] OR;  Service: Vascular;  Laterality: Right;    Allergies  Allergen Reactions   Amoxicillin Other (See Comments)    Intolerant  Daughter and Son report patient tolerated course of Augmentin in January 2024   Codeine Nausea Only   Ibandronate Sodium  [Ibandronic Acid] Other (See Comments)    Gi upset   Risedronate Other (See Comments)    Gi upset   Sulfa Antibiotics Hives    Outpatient Encounter Medications as of 12/20/2022  Medication Sig   acetaminophen (TYLENOL) 325 MG tablet Take 2 tablets (650 mg total) by mouth every 4 (four) hours as needed for moderate pain.   amLODipine (NORVASC) 10 MG tablet Take 1 tablet (10 mg total) by mouth daily.   atorvastatin (LIPITOR) 20 MG tablet Take 20 mg by mouth daily.   Calcium Carbonate (CALCIUM 600 PO) Take 600 mg by mouth in the morning and at bedtime.   carvedilol (COREG) 12.5 MG tablet Take 1 tablet (12.5 mg total) by mouth 2 (two) times daily with a meal.   folic acid (FOLVITE)  1 MG tablet Take 1 mg by mouth daily.   hydrALAZINE (APRESOLINE) 10 MG tablet Take 1 tablet (10 mg total) by mouth daily as needed (for SBP > 130).   levothyroxine (SYNTHROID, LEVOTHROID) 75 MCG tablet Take 75 mcg by mouth at bedtime.   losartan (COZAAR) 100 MG tablet Take 1 tablet (100 mg total) by mouth daily.   methotrexate (RHEUMATREX) 2.5 MG tablet Take 15 mg by mouth every Friday.   Multiple Vitamin (MULTIVITAMIN WITH MINERALS) TABS tablet Take 1 tablet by mouth daily.   nitrofurantoin, macrocrystal-monohydrate, (MACROBID) 100 MG capsule Take 1 capsule (100 mg total) by mouth daily.    predniSONE (DELTASONE) 2.5 MG tablet Take 2.5 mg by mouth daily.   Propylene Glycol (SYSTANE BALANCE OP) PRN order: Instill one drop in both eyes every 12 hours as needed for dry eyes.   senna (SENOKOT) 8.6 MG TABS tablet Take 1 tablet by mouth daily.   sertraline (ZOLOFT) 50 MG tablet Take 100 mg by mouth at bedtime.   triamcinolone cream (KENALOG) 0.1 % Apply 1 Application topically as directed. Apply to trunk, arms and legs 3 days a week Monday, Wednesday, Friday, for atopic dermatitis and stasis dermatitis, avoid face, groin, axilla   No facility-administered encounter medications on file as of 12/20/2022.    Review of Systems  Immunization History  Administered Date(s) Administered   Influenza Split 01/21/2014, 12/04/2015   Influenza, High Dose Seasonal PF 01/31/2017, 12/18/2018   Influenza-Unspecified 01/27/2015, 12/18/2018, 01/01/2020, 01/05/2021, 01/19/2022   Moderna Covid-19 Vaccine Bivalent Booster 16yrs & up 09/01/2021   Moderna Sars-Covid-2 Vaccination 04/23/2019, 05/21/2019, 02/19/2020, 07/11/2020, 01/29/2021   Pneumococcal Conjugate-13 03/15/2014   Pneumococcal Polysaccharide-23 04/05/2001, 08/13/2016   RSV,unspecified 01/19/2022   Td 07/04/2005   Td (Adult),5 Lf Tetanus Toxid, Preservative Free 02/15/2017   Tdap 07/27/2005, 02/15/2017   Zoster Recombinant(Shingrix) 12/29/2018, 06/10/2019   Zoster, Live 04/05/2006   Pertinent  Health Maintenance Due  Topic Date Due   DEXA SCAN  Never done   INFLUENZA VACCINE  11/04/2022      12/17/2020    9:00 AM 12/17/2020    2:42 PM 12/18/2020    2:00 AM 12/18/2020    1:00 PM 03/28/2022    6:10 AM  Fall Risk  (RETIRED) Patient Fall Risk Level High fall risk High fall risk High fall risk High fall risk High fall risk   Functional Status Survey:    Vitals:   12/20/22 1406  BP: 120/64  Pulse: 66  Resp: 16  Temp: 97.7 F (36.5 C)  SpO2: 98%  Weight: 136 lb 12.8 oz (62.1 kg)  Height: 5\' 7"  (1.702 m)   Body mass index  is 21.43 kg/m. Physical Exam Constitutional:      Comments: Frail, elderly  HENT:     Head:     Comments: Multiple bruises of eye, face, neck. Staples in scalp and stitches on eyebrown. Hemostatic w/o surrounding erythema.  Cardiovascular:     Rate and Rhythm: Normal rate.  Pulmonary:     Effort: Pulmonary effort is normal.  Abdominal:     General: Abdomen is flat.  Neurological:     Mental Status: She is alert. She is disoriented.    Labs reviewed: Recent Labs    10/12/22 0127 10/13/22 0132 10/14/22 0157 10/15/22 0112 10/28/22 0000 11/04/22 0000 11/18/22 0000 12/09/22 0000 12/15/22 1907  NA 133* 131* 132* 134* 137  --  143  --  137  K 3.9 3.2* 3.9 3.9 3.3*   < > 3.3*  3.3* 3.8  CL 104 104 105 106 103  --  107  --  105  CO2 20* 20* 20* 21* 27*  --  29*  --  22  GLUCOSE 104* 106* 108* 112*  --   --   --   --  110*  BUN 18 20 20 17 12   --  10  --  18  CREATININE 0.86 0.74 0.68 0.66 0.6  --  0.6  --  0.56  CALCIUM 8.2* 8.1* 8.3* 8.2* 8.2*  --  8.6*  --  8.6*  MG 1.9 2.6* 2.3  --   --   --   --   --   --   PHOS 3.3  --   --   --   --   --   --   --   --    < > = values in this interval not displayed.   Recent Labs    06/01/22 1238 06/03/22 1117 06/04/22 0416 08/10/22 0000 10/28/22 0000 11/18/22 0000  AST 25 22 17 17 16 16   ALT 16 13 10 9 19 11   ALKPHOS 60 51 43 77 58 76  BILITOT 1.3* 1.5* 1.3*  --   --   --   PROT 7.0 6.1* 5.2*  --   --   --   ALBUMIN 3.5 2.9* 2.4* 3.2* 2.7* 3.4*   Recent Labs    06/03/22 1117 06/03/22 1819 06/14/22 0000 08/10/22 0000 10/12/22 0127 10/15/22 0112 10/28/22 0000 12/15/22 1907  WBC 18.2*   < > 14.2   < > 13.9* 14.3* 7.4 9.6  NEUTROABS 15.1*  --  9,670.00  --   --   --  4,706.00  --   HGB 11.4*   < > 9.8*   < > 10.4* 10.2* 9.2* 10.0*  HCT 36.0   < > 30*   < > 32.6* 31.3* 29* 31.7*  MCV 100.6*   < >  --    < > 92.1 87.2  --  92.4  PLT 205   < > 308   < > 160 191 306 205   < > = values in this interval not displayed.    Lab Results  Component Value Date   TSH 4.33 06/01/2022   Lab Results  Component Value Date   HGBA1C 5.6 08/26/2022   Lab Results  Component Value Date   CHOL 150 12/09/2022   HDL 75 (A) 12/09/2022   LDLCALC 58 12/09/2022   TRIG 90 12/09/2022   CHOLHDL 2.5 04/07/2019    Significant Diagnostic Results in last 30 days:  CT HEAD WO CONTRAST  Result Date: 12/15/2022 CLINICAL DATA:  Head trauma EXAM: CT HEAD WITHOUT CONTRAST CT CERVICAL SPINE WITHOUT CONTRAST TECHNIQUE: Multidetector CT imaging of the head and cervical spine was performed following the standard protocol without intravenous contrast. Multiplanar CT image reconstructions of the cervical spine were also generated. RADIATION DOSE REDUCTION: This exam was performed according to the departmental dose-optimization program which includes automated exposure control, adjustment of the mA and/or kV according to patient size and/or use of iterative reconstruction technique. COMPARISON:  06/01/2022 FINDINGS: CT HEAD FINDINGS Brain: There is no mass, hemorrhage or extra-axial collection. There is hypoattenuation of the supratentorial white matter. CSF spaces are normal. Vascular: Calcific atherosclerosis of the carotid and vertebral arteries at the skull base. Skull: Normal Sinuses/Orbits: Paranasal sinuses are clear. No mastoid effusion. Normal orbits. Other: None CT CERVICAL SPINE FINDINGS Alignment: Normal Skull base and vertebrae: No fracture.  Soft tissues and spinal canal: Negative Disc levels:  No spinal canal stenosis. Upper chest: Negative. Other: None IMPRESSION: 1. No acute intracranial abnormality. 2. Chronic small vessel disease. 3. No acute fracture or static subluxation of the cervical spine. Electronically Signed   By: Deatra Robinson M.D.   On: 12/15/2022 20:48   CT Cervical Spine Wo Contrast  Result Date: 12/15/2022 CLINICAL DATA:  Head trauma EXAM: CT HEAD WITHOUT CONTRAST CT CERVICAL SPINE WITHOUT CONTRAST TECHNIQUE:  Multidetector CT imaging of the head and cervical spine was performed following the standard protocol without intravenous contrast. Multiplanar CT image reconstructions of the cervical spine were also generated. RADIATION DOSE REDUCTION: This exam was performed according to the departmental dose-optimization program which includes automated exposure control, adjustment of the mA and/or kV according to patient size and/or use of iterative reconstruction technique. COMPARISON:  06/01/2022 FINDINGS: CT HEAD FINDINGS Brain: There is no mass, hemorrhage or extra-axial collection. There is hypoattenuation of the supratentorial white matter. CSF spaces are normal. Vascular: Calcific atherosclerosis of the carotid and vertebral arteries at the skull base. Skull: Normal Sinuses/Orbits: Paranasal sinuses are clear. No mastoid effusion. Normal orbits. Other: None CT CERVICAL SPINE FINDINGS Alignment: Normal Skull base and vertebrae: No fracture. Soft tissues and spinal canal: Negative Disc levels:  No spinal canal stenosis. Upper chest: Negative. Other: None IMPRESSION: 1. No acute intracranial abnormality. 2. Chronic small vessel disease. 3. No acute fracture or static subluxation of the cervical spine. Electronically Signed   By: Deatra Robinson M.D.   On: 12/15/2022 20:48    Assessment/Plan Recurrent falls  Moderate episode of recurrent major depressive disorder (HCC)  Moderate dementia with mood disturbance, unspecified dementia type (HCC)  Malnutrition of moderate degree (HCC)  Rheumatoid arthritis, involving unspecified site, unspecified whether rheumatoid factor present (HCC)  Peripheral vascular disease (HCC)  Immunosuppression (HCC) Patient with recent fall and ED visit for sutures and staples of laceration. DOI 9/11. Staple removal on 12/23/2022. Discussed importance of assistance for ambulation. Patient's memory deficits contribute to continued risk of falls. No fractures with recent fall. CT head and  neck negative. Patient continues to show signs of progression of memory changes with poor adherence to supportive care. Patient has lost ~15 lbs since arrival to long term care despite protein supplementation. RA pain well-controlled with injections. PVD without symptoms at this time, however, patient is at risk for developing wounds to the bilateral feet in the future with continued poor PO intake and underlying vascular disease. Immunosuppression for RA management. Continue supportive care. Continue GOC conversations  Family/ staff Communication: nursing  Labs/tests ordered:  none   I spent greater than 35 minutes for the care of this patient in face to face time, chart review, clinical documentation, patient education.

## 2022-12-23 ENCOUNTER — Non-Acute Institutional Stay (SKILLED_NURSING_FACILITY): Payer: Medicare PPO | Admitting: Nurse Practitioner

## 2022-12-23 ENCOUNTER — Encounter: Payer: Self-pay | Admitting: Nurse Practitioner

## 2022-12-23 DIAGNOSIS — E039 Hypothyroidism, unspecified: Secondary | ICD-10-CM

## 2022-12-23 DIAGNOSIS — E440 Moderate protein-calorie malnutrition: Secondary | ICD-10-CM | POA: Diagnosis not present

## 2022-12-23 DIAGNOSIS — I10 Essential (primary) hypertension: Secondary | ICD-10-CM

## 2022-12-23 DIAGNOSIS — R296 Repeated falls: Secondary | ICD-10-CM

## 2022-12-23 DIAGNOSIS — F03B3 Unspecified dementia, moderate, with mood disturbance: Secondary | ICD-10-CM | POA: Diagnosis not present

## 2022-12-23 DIAGNOSIS — E782 Mixed hyperlipidemia: Secondary | ICD-10-CM

## 2022-12-23 DIAGNOSIS — M069 Rheumatoid arthritis, unspecified: Secondary | ICD-10-CM

## 2022-12-23 DIAGNOSIS — H04123 Dry eye syndrome of bilateral lacrimal glands: Secondary | ICD-10-CM

## 2022-12-23 DIAGNOSIS — F331 Major depressive disorder, recurrent, moderate: Secondary | ICD-10-CM

## 2022-12-23 DIAGNOSIS — D649 Anemia, unspecified: Secondary | ICD-10-CM

## 2022-12-23 NOTE — Progress Notes (Signed)
Location:  Other [place].  Nursing Home Room Number: [facility] [address]A Place of Service:  SNF ([phone]) Gloria Chatters, NP  PCP: Gloria Mealing, MD  Patient Care Team: Gloria Mealing, MD as PCP - General [facility])  Extended Emergency Contact Information Primary Emergency Contact: Gloria [name] [place] of [place] Home Phone: [phone] Relation: Daughter Secondary Emergency Contact: Gloria [name] Home Phone: [phone] Mobile Phone: [phone] Relation: Son  Goals of care: Advanced Directive information    12/23/2022    9:33 AM  Advanced Directives  Does Patient Have a Medical Advance Directive? Yes  Type of Advance Directive Out of facility DNR (pink MOST or yellow form)  Does patient want to make changes to medical advance directive? No - Patient declined     Chief Complaint  Patient presents with   Medical Management of Chronic Issues    Medical Management of Chronic Issues.     HPI:  Pt is a 89 y.o. female seen today for medical management of chronic disease.  Pt went to the ED on 9/11 due to fall. She tells me her stitches are due to come out today. Denies headache or dizziness at this time. Reports she is more fatigue recently.  States nursing gave her tylenol for her pain.  Nursing states she generally does not complain of pain.   Staff reports patient complains of dry eye, daughter request drops be scheduled.   She denies any worsening anxiety or depression States she feels like "this may be the beginning of the end but I am 89"    Past Medical History:  Diagnosis Date   Actinic keratosis    Arthritis    Basal cell carcinoma    Left chin, right nasolabial   Collagen vascular disease (HCC)    RA   Heartburn    Hypertension    Squamous cell carcinoma of skin 05/06/2021   left hand, treated with [facility]   Past Surgical History:  Procedure Laterality Date   ABDOMINAL HYSTERECTOMY     BREAST CYST EXCISION     HIP ARTHROPLASTY  Right 11/30/2019   Procedure: ARTHROPLASTY BIPOLAR HIP (HEMIARTHROPLASTY);  Surgeon: Gloria Flake, MD;  Location: [facility] ORS;  Service: Orthopedics;  Laterality: Right;   SACROPLASTY N/A 12/17/2020   Procedure: SACROPLASTY;  Surgeon: Gloria Bucker, MD;  Location: [facility] ORS;  Service: Orthopedics;  Laterality: N/A;   THORACIC AORTIC ENDOVASCULAR STENT GRAFT N/A 04/11/2019   Procedure: THORACIC AORTIC ENDOVASCULAR STENT GRAFT insertion;  Surgeon: Gloria Libman, MD;  Location: [facility] OR;  Service: Vascular;  Laterality: N/A;   ULTRASOUND GUIDANCE FOR VASCULAR ACCESS Right 04/11/2019   Procedure: Ultrasound Guidance For Vascular Access, right femoral artery;  Surgeon: Gloria Libman, MD;  Location: [facility] OR;  Service: Vascular;  Laterality: Right;    Allergies  Allergen Reactions   Amoxicillin Other (See Comments)    Intolerant  Daughter and Son report patient tolerated course of Augmentin in January 2024   Codeine Nausea Only   Ibandronate Sodium  [Ibandronic Acid] Other (See Comments)    Gi upset   Risedronate Other (See Comments)    Gi upset   Sulfa Antibiotics Hives    Outpatient Encounter Medications as of 12/23/2022  Medication Sig   acetaminophen (TYLENOL) 500 MG tablet Take 1,000 mg by mouth every 8 (eight) hours as needed.   amLODipine (NORVASC) 10 MG tablet Take 1 tablet (10 mg total) by mouth daily.   atorvastatin (LIPITOR) 20 MG tablet Take 20 mg by mouth  daily.   Calcium Carbonate (CALCIUM 600 PO) Take 600 mg by mouth in the morning and at bedtime.   carvedilol (COREG) 12.5 MG tablet Take 1 tablet (12.5 mg total) by mouth 2 (two) times daily with a meal.   folic acid (FOLVITE) 1 MG tablet Take 1 mg by mouth daily.   hydrALAZINE (APRESOLINE) 10 MG tablet Take 1 tablet (10 mg total) by mouth daily as needed (for SBP > 130).   levothyroxine (SYNTHROID, LEVOTHROID) 75 MCG tablet Take 75 mcg by mouth at bedtime.   losartan (COZAAR) 100 MG tablet Take 1 tablet (100 mg total) by mouth daily.    methotrexate (RHEUMATREX) 2.5 MG tablet Take 15 mg by mouth every Friday.   nitrofurantoin, macrocrystal-monohydrate, (MACROBID) 100 MG capsule Take 1 capsule (100 mg total) by mouth daily.   predniSONE (DELTASONE) 2.5 MG tablet Take 2.5 mg by mouth daily.   Propylene Glycol (SYSTANE BALANCE OP) PRN order: Instill one drop in both eyes every 12 hours as needed for dry eyes.   senna (SENOKOT) 8.6 MG TABS tablet Take 1 tablet by mouth daily.   sertraline (ZOLOFT) 50 MG tablet Take 100 mg by mouth at bedtime.   triamcinolone cream (KENALOG) 0.1 % Apply 1 Application topically as directed. Apply to trunk, arms and legs 3 days a week Monday, Wednesday, Friday, for atopic dermatitis and stasis dermatitis, avoid face, groin, axilla   [DISCONTINUED] acetaminophen (TYLENOL) 325 MG tablet Take 2 tablets (650 mg total) by mouth every 4 (four) hours as needed for moderate pain.   [DISCONTINUED] Multiple Vitamin (MULTIVITAMIN WITH MINERALS) TABS tablet Take 1 tablet by mouth daily.   No facility-administered encounter medications on file as of 12/23/2022.    Review of Systems  Constitutional:  Positive for fatigue. Negative for activity change and appetite change.  HENT:  Negative for congestion and hearing loss.   Eyes: Negative.   Respiratory:  Negative for cough and shortness of breath.   Cardiovascular:  Negative for chest pain, palpitations and leg swelling.  Gastrointestinal:  Negative for abdominal pain, constipation and diarrhea.  Genitourinary:  Negative for difficulty urinating and dysuria.  Musculoskeletal:  Positive for arthralgias. Negative for myalgias.  Skin:  Negative for color change and wound.  Neurological:  Positive for weakness. Negative for dizziness.  Psychiatric/Behavioral:  Positive for confusion. Negative for agitation and behavioral problems.      Immunization History  Administered Date(s) Administered   Influenza Split 01/21/2014, 12/04/2015   Influenza, High Dose  Seasonal PF 01/31/2017, 12/18/2018   Influenza-Unspecified 01/27/2015, 12/18/2018, 01/01/2020, 01/05/2021, 01/19/2022   Moderna Covid-19 Vaccine Bivalent Booster 40yrs & up 09/01/2021   Moderna Sars-Covid-2 Vaccination 04/23/2019, 05/21/2019, 02/19/2020, 07/11/2020, 01/29/2021   Pneumococcal Conjugate-13 03/15/2014   Pneumococcal Polysaccharide-23 04/05/2001, 08/13/2016   RSV,unspecified 01/19/2022   Td 07/04/2005   Td (Adult),5 Lf Tetanus Toxid, Preservative Free 02/15/2017   Tdap 07/27/2005, 02/15/2017   Zoster Recombinant(Shingrix) 12/29/2018, 06/10/2019   Zoster, Live 04/05/2006   Pertinent  Health Maintenance Due  Topic Date Due   DEXA SCAN  Never done   INFLUENZA VACCINE  11/04/2022      12/17/2020    9:00 AM 12/17/2020    2:42 PM 12/18/2020    2:00 AM 12/18/2020    1:00 PM 03/28/2022    6:10 AM  Fall Risk  (RETIRED) Patient Fall Risk Level High fall risk High fall risk High fall risk High fall risk High fall risk   Functional Status Survey:    Vitals:  12/23/22 0926 12/23/22 0949  BP: (!) 145/70 124/62  Pulse: 67   Resp: 17   Temp: (!) 97 F (36.1 C)   SpO2: 98%   Weight: 136 lb 12.8 oz (62.1 kg)   Height: 5\' 7"  (1.702 m)    Body mass index is 21.43 kg/m. Filed Weights   12/23/22 0926  Weight: 136 lb 12.8 oz (62.1 kg)    Physical Exam Constitutional:      General: She is not in acute distress.    Appearance: She is well-developed. She is not diaphoretic.  HENT:     Head: Normocephalic.     Comments: Laceration and ecchymosis over left eye, healing well.     Mouth/Throat:     Pharynx: No oropharyngeal exudate.  Eyes:     Conjunctiva/sclera: Conjunctivae normal.     Pupils: Pupils are equal, round, and reactive to light.  Cardiovascular:     Rate and Rhythm: Normal rate and regular rhythm.     Heart sounds: Normal heart sounds.  Pulmonary:     Effort: Pulmonary effort is normal.     Breath sounds: Normal breath sounds.  Abdominal:     General:  Bowel sounds are normal.     Palpations: Abdomen is soft.  Musculoskeletal:     Cervical back: Normal range of motion and neck supple.     Right lower leg: No edema.     Left lower leg: No edema.  Skin:    General: Skin is warm and dry.  Neurological:     Mental Status: She is alert.  Psychiatric:        Mood and Affect: Mood normal.    Labs reviewed: Recent Labs    10/12/22 0127 10/13/22 0132 10/14/22 0157 10/15/22 0112 10/28/22 0000 11/04/22 0000 11/18/22 0000 12/09/22 0000 12/15/22 1907  NA 133* 131* 132* 134* 137  --  143  --  137  K 3.9 3.2* 3.9 3.9 3.3*   < > 3.3* 3.3* 3.8  CL 104 104 105 106 103  --  107  --  105  CO2 20* 20* 20* 21* 27*  --  29*  --  22  GLUCOSE 104* 106* 108* 112*  --   --   --   --  110*  BUN 18 20 20 17 12   --  10  --  18  CREATININE 0.86 0.74 0.68 0.66 0.6  --  0.6  --  0.56  CALCIUM 8.2* 8.1* 8.3* 8.2* 8.2*  --  8.6*  --  8.6*  MG 1.9 2.6* 2.3  --   --   --   --   --   --   PHOS 3.3  --   --   --   --   --   --   --   --    < > = values in this interval not displayed.   Recent Labs    06/01/22 1238 06/03/22 1117 06/04/22 0416 08/10/22 0000 10/28/22 0000 11/18/22 0000  AST 25 22 17 17 16 16   ALT 16 13 10 9 19 11   ALKPHOS 60 51 43 77 58 76  BILITOT 1.3* 1.5* 1.3*  --   --   --   PROT 7.0 6.1* 5.2*  --   --   --   ALBUMIN 3.5 2.9* 2.4* 3.2* 2.7* 3.4*   Recent Labs    06/03/22 1117 06/03/22 1819 06/14/22 0000 08/10/22 0000 10/12/22 0127 10/15/22 0112 10/28/22 0000 12/15/22 1907  WBC  18.2*   < > 14.2   < > 13.9* 14.3* 7.4 9.6  NEUTROABS 15.1*  --  9,670.00  --   --   --  4,706.00  --   HGB 11.4*   < > 9.8*   < > 10.4* 10.2* 9.2* 10.0*  HCT 36.0   < > 30*   < > 32.6* 31.3* 29* 31.7*  MCV 100.6*   < >  --    < > 92.1 87.2  --  92.4  PLT 205   < > 308   < > 160 191 306 205   < > = values in this interval not displayed.   Lab Results  Component Value Date   TSH 4.33 06/01/2022   Lab Results  Component Value Date    HGBA1C 5.6 08/26/2022   Lab Results  Component Value Date   CHOL 150 12/09/2022   HDL 75 (A) 12/09/2022   LDLCALC 58 12/09/2022   TRIG 90 12/09/2022   CHOLHDL 2.5 04/07/2019    Significant Diagnostic Results in last 30 days:  CT HEAD WO CONTRAST  Result Date: 12/15/2022 CLINICAL DATA:  Head trauma EXAM: CT HEAD WITHOUT CONTRAST CT CERVICAL SPINE WITHOUT CONTRAST TECHNIQUE: Multidetector CT imaging of the head and cervical spine was performed following the standard protocol without intravenous contrast. Multiplanar CT image reconstructions of the cervical spine were also generated. RADIATION DOSE REDUCTION: This exam was performed according to the departmental dose-optimization program which includes automated exposure control, adjustment of the mA and/or kV according to patient size and/or use of iterative reconstruction technique. COMPARISON:  06/01/2022 FINDINGS: CT HEAD FINDINGS Brain: There is no mass, hemorrhage or extra-axial collection. There is hypoattenuation of the supratentorial white matter. CSF spaces are normal. Vascular: Calcific atherosclerosis of the carotid and vertebral arteries at the skull base. Skull: Normal Sinuses/Orbits: Paranasal sinuses are clear. No mastoid effusion. Normal orbits. Other: None CT CERVICAL SPINE FINDINGS Alignment: Normal Skull base and vertebrae: No fracture. Soft tissues and spinal canal: Negative Disc levels:  No spinal canal stenosis. Upper chest: Negative. Other: None IMPRESSION: 1. No acute intracranial abnormality. 2. Chronic small vessel disease. 3. No acute fracture or static subluxation of the cervical spine. Electronically Signed   By: Deatra Robinson M.D.   On: 12/15/2022 20:48   CT Cervical Spine Wo Contrast  Result Date: 12/15/2022 CLINICAL DATA:  Head trauma EXAM: CT HEAD WITHOUT CONTRAST CT CERVICAL SPINE WITHOUT CONTRAST TECHNIQUE: Multidetector CT imaging of the head and cervical spine was performed following the standard protocol without  intravenous contrast. Multiplanar CT image reconstructions of the cervical spine were also generated. RADIATION DOSE REDUCTION: This exam was performed according to the departmental dose-optimization program which includes automated exposure control, adjustment of the mA and/or kV according to patient size and/or use of iterative reconstruction technique. COMPARISON:  06/01/2022 FINDINGS: CT HEAD FINDINGS Brain: There is no mass, hemorrhage or extra-axial collection. There is hypoattenuation of the supratentorial white matter. CSF spaces are normal. Vascular: Calcific atherosclerosis of the carotid and vertebral arteries at the skull base. Skull: Normal Sinuses/Orbits: Paranasal sinuses are clear. No mastoid effusion. Normal orbits. Other: None CT CERVICAL SPINE FINDINGS Alignment: Normal Skull base and vertebrae: No fracture. Soft tissues and spinal canal: Negative Disc levels:  No spinal canal stenosis. Upper chest: Negative. Other: None IMPRESSION: 1. No acute intracranial abnormality. 2. Chronic small vessel disease. 3. No acute fracture or static subluxation of the cervical spine. Electronically Signed   By: Chrisandra Netters.D.  On: 12/15/2022 20:48    Assessment/Plan 1. Dry eyes Will schedule sustain eye drops twice daily and can increase if needed   2. Recurrent falls Ongoing falls, high fall risk, fall precautions in place. Recent fall with ED visit resulting in lacerations requiring sutures and staples, well healing and will be removed today by nursing.   3. Moderate dementia with mood disturbance, unspecified dementia type (HCC) -Stable, no acute changes in cognitive or functional status, continue supportive care.   4. Malnutrition of moderate degree (HCC) Ongoing, expect ongoing decline with progression of dementia.   5. Anemia, unspecified type Noted, will follow up. No signs of acute blood loss.   6. Moderate episode of recurrent major depressive disorder (HCC) Mood stable at this  time. Discussed having a chaplin follow up with her but she did not answer. Will have nursing follow up.   7. Essential hypertension At goal on current regimen.   8. Rheumatoid arthritis, involving unspecified site, unspecified whether rheumatoid factor present (HCC) No uncontrolled pain at this time. Will continue current regimen  9. Mixed hyperlipidemia -stable on last labs, continues on lipitor  10. Hypothyroidism, unspecified type -tsh at goal on labs in April, continues current synthroid dose.    Gloria [name]. Biagio Borg [facility] [phone]

## 2022-12-24 ENCOUNTER — Encounter: Payer: Self-pay | Admitting: Student

## 2022-12-24 ENCOUNTER — Non-Acute Institutional Stay (SKILLED_NURSING_FACILITY): Payer: Self-pay | Admitting: Student

## 2022-12-24 DIAGNOSIS — Z4802 Encounter for removal of sutures: Secondary | ICD-10-CM

## 2022-12-24 NOTE — Progress Notes (Unsigned)
Location:  Other [facility].  Nursing Home Room Number: [facility] [address]A Place of Service:  SNF [phone]) Provider:  Earnestine Mealing, MD  Patient Care Team: Earnestine Mealing, MD as PCP - General (Family Medicine)  Extended Emergency Contact Information Primary Emergency Contact: Verna Czech of [place] Home Phone: [phone] Relation: Daughter Secondary Emergency Contact: Aurelio Jew Home Phone: [phone] Mobile Phone: [phone] Relation: Son  Code Status:  DNR Goals of care: Advanced Directive information    12/24/2022   10:34 AM  Advanced Directives  Does Patient Have a Medical Advance Directive? Yes  Type of Advance Directive Out of facility DNR (pink MOST or yellow form)  Does patient want to make changes to medical advance directive? No - Patient declined     Chief Complaint  Patient presents with   Acute Visit    Staple Removal.     HPI:  Pt is a 89 y.o. female seen today for an acute visit for Staple Removal. Patient with laceration 9/10 after a fall. Stapled in the ED, however, nursing unable to remove staples due to imbedded in scalp   Past Medical History:  Diagnosis Date   Actinic keratosis    Arthritis    Basal cell carcinoma    Left chin, right nasolabial   Collagen vascular disease (HCC)    RA   Heartburn    Hypertension    Squamous cell carcinoma of skin 05/06/2021   left hand, treated with [facility]   Past Surgical History:  Procedure Laterality Date   ABDOMINAL HYSTERECTOMY     BREAST CYST EXCISION     HIP ARTHROPLASTY Right 11/30/2019   Procedure: ARTHROPLASTY BIPOLAR HIP (HEMIARTHROPLASTY);  Surgeon: Christena Flake, MD;  Location: [facility] ORS;  Service: Orthopedics;  Laterality: Right;   SACROPLASTY N/A 12/17/2020   Procedure: SACROPLASTY;  Surgeon: Kennedy Bucker, MD;  Location: [facility] ORS;  Service: Orthopedics;  Laterality: N/A;   THORACIC AORTIC ENDOVASCULAR STENT GRAFT N/A 04/11/2019   Procedure: THORACIC AORTIC  ENDOVASCULAR STENT GRAFT insertion;  Surgeon: Nada Libman, MD;  Location: [facility] OR;  Service: Vascular;  Laterality: N/A;   ULTRASOUND GUIDANCE FOR VASCULAR ACCESS Right 04/11/2019   Procedure: Ultrasound Guidance For Vascular Access, right femoral artery;  Surgeon: Nada Libman, MD;  Location: [facility] OR;  Service: Vascular;  Laterality: Right;    Allergies  Allergen Reactions   Amoxicillin Other (See Comments)    Intolerant  Daughter and Son report patient tolerated course of Augmentin in January 2024   Codeine Nausea Only   Ibandronate Sodium  [Ibandronic Acid] Other (See Comments)    Gi upset   Risedronate Other (See Comments)    Gi upset   Sulfa Antibiotics Hives    Outpatient Encounter Medications as of 12/24/2022  Medication Sig   acetaminophen (TYLENOL) 500 MG tablet Take 1,000 mg by mouth every 8 (eight) hours as needed.   amLODipine (NORVASC) 10 MG tablet Take 1 tablet (10 mg total) by mouth daily.   atorvastatin (LIPITOR) 20 MG tablet Take 20 mg by mouth daily.   Calcium Carbonate (CALCIUM 600 PO) Take 600 mg by mouth in the morning and at bedtime.   carvedilol (COREG) 12.5 MG tablet Take 1 tablet (12.5 mg total) by mouth 2 (two) times daily with a meal.   folic acid (FOLVITE) 1 MG tablet Take 1 mg by mouth daily.   hydrALAZINE (APRESOLINE) 10 MG tablet Take 1 tablet (10 mg total) by mouth daily as needed (for SBP > 130).  levothyroxine (SYNTHROID, LEVOTHROID) 75 MCG tablet Take 75 mcg by mouth at bedtime.   losartan (COZAAR) 100 MG tablet Take 1 tablet (100 mg total) by mouth daily.   methotrexate (RHEUMATREX) 2.5 MG tablet Take 15 mg by mouth every Friday.   Multiple Vitamins-Minerals (MULTIVITAMINS THER. W/MINERALS) TABS tablet Take 1 tablet by mouth daily.   nitrofurantoin, macrocrystal-monohydrate, (MACROBID) 100 MG capsule Take 1 capsule (100 mg total) by mouth daily.   predniSONE (DELTASONE) 2.5 MG tablet Take 2.5 mg by mouth daily.   Propylene Glycol (SYSTANE  BALANCE OP) PRN order: Instill one drop in both eyes every 12 hours as needed for dry eyes.   senna (SENOKOT) 8.6 MG TABS tablet Take 1 tablet by mouth daily.   sertraline (ZOLOFT) 50 MG tablet Take 100 mg by mouth at bedtime.   triamcinolone cream (KENALOG) 0.1 % Apply 1 Application topically as directed. Apply to trunk, arms and legs 3 days a week Monday, Wednesday, Friday, for atopic dermatitis and stasis dermatitis, avoid face, groin, axilla   No facility-administered encounter medications on file as of 12/24/2022.    Review of Systems  Immunization History  Administered Date(s) Administered   Influenza Split 01/21/2014, 12/04/2015   Influenza, High Dose Seasonal PF 01/31/2017, 12/18/2018   Influenza-Unspecified 01/27/2015, 12/18/2018, 01/01/2020, 01/05/2021, 01/19/2022   Moderna Covid-19 Vaccine Bivalent Booster 24yrs & up 09/01/2021   Moderna Sars-Covid-2 Vaccination 04/23/2019, 05/21/2019, 02/19/2020, 07/11/2020, 01/29/2021   Pneumococcal Conjugate-13 03/15/2014   Pneumococcal Polysaccharide-23 04/05/2001, 08/13/2016   RSV,unspecified 01/19/2022   Td 07/04/2005   Td (Adult),5 Lf Tetanus Toxid, Preservative Free 02/15/2017   Tdap 07/27/2005, 02/15/2017   Zoster Recombinant(Shingrix) 12/29/2018, 06/10/2019   Zoster, Live 04/05/2006   Pertinent  Health Maintenance Due  Topic Date Due   DEXA SCAN  Never done   INFLUENZA VACCINE  11/04/2022      12/17/2020    9:00 AM 12/17/2020    2:42 PM 12/18/2020    2:00 AM 12/18/2020    1:00 PM 03/28/2022    6:10 AM  Fall Risk  (RETIRED) Patient Fall Risk Level High fall risk High fall risk High fall risk High fall risk High fall risk   Functional Status Survey:    Vitals:   12/24/22 1028  BP: 130/87  Pulse: 66  Resp: 17  Temp: (!) 97 F (36.1 C)  SpO2: 98%  Weight: 136 lb 12.8 oz (62.1 kg)  Height: 5\' 7"  (1.702 m)   Body mass index is 21.43 kg/m. Physical Exam Skin:    Comments: 3 imbedded staples present, significant  dried blood caked on wound.      Labs reviewed: Recent Labs    10/12/22 0127 10/13/22 0132 10/14/22 0157 10/15/22 0112 10/28/22 0000 11/04/22 0000 11/18/22 0000 12/09/22 0000 12/15/22 1907  NA 133* 131* 132* 134* 137  --  143  --  137  K 3.9 3.2* 3.9 3.9 3.3*   < > 3.3* 3.3* 3.8  CL 104 104 105 106 103  --  107  --  105  CO2 20* 20* 20* 21* 27*  --  29*  --  22  GLUCOSE 104* 106* 108* 112*  --   --   --   --  110*  BUN 18 20 20 17 12   --  10  --  18  CREATININE 0.86 0.74 0.68 0.66 0.6  --  0.6  --  0.56  CALCIUM 8.2* 8.1* 8.3* 8.2* 8.2*  --  8.6*  --  8.6*  MG 1.9  2.6* 2.3  --   --   --   --   --   --   PHOS 3.3  --   --   --   --   --   --   --   --    < > = values in this interval not displayed.   Recent Labs    06/01/22 1238 06/03/22 1117 06/04/22 0416 08/10/22 0000 10/28/22 0000 11/18/22 0000  AST 25 22 17 17 16 16   ALT 16 13 10 9 19 11   ALKPHOS 60 51 43 77 58 76  BILITOT 1.3* 1.5* 1.3*  --   --   --   PROT 7.0 6.1* 5.2*  --   --   --   ALBUMIN 3.5 2.9* 2.4* 3.2* 2.7* 3.4*   Recent Labs    06/03/22 1117 06/03/22 1819 06/14/22 0000 08/10/22 0000 10/12/22 0127 10/15/22 0112 10/28/22 0000 12/15/22 1907  WBC 18.2*   < > 14.2   < > 13.9* 14.3* 7.4 9.6  NEUTROABS 15.1*  --  9,670.00  --   --   --  4,706.00  --   HGB 11.4*   < > 9.8*   < > 10.4* 10.2* 9.2* 10.0*  HCT 36.0   < > 30*   < > 32.6* 31.3* 29* 31.7*  MCV 100.6*   < >  --    < > 92.1 87.2  --  92.4  PLT 205   < > 308   < > 160 191 306 205   < > = values in this interval not displayed.   Lab Results  Component Value Date   TSH 4.33 06/01/2022   Lab Results  Component Value Date   HGBA1C 5.6 08/26/2022   Lab Results  Component Value Date   CHOL 150 12/09/2022   HDL 75 (A) 12/09/2022   LDLCALC 58 12/09/2022   TRIG 90 12/09/2022   CHOLHDL 2.5 04/07/2019    Significant Diagnostic Results in last 30 days:  CT HEAD WO CONTRAST  Result Date: 12/15/2022 CLINICAL DATA:  Head trauma EXAM: CT  HEAD WITHOUT CONTRAST CT CERVICAL SPINE WITHOUT CONTRAST TECHNIQUE: Multidetector CT imaging of the head and cervical spine was performed following the standard protocol without intravenous contrast. Multiplanar CT image reconstructions of the cervical spine were also generated. RADIATION DOSE REDUCTION: This exam was performed according to the departmental dose-optimization program which includes automated exposure control, adjustment of the mA and/or kV according to patient size and/or use of iterative reconstruction technique. COMPARISON:  06/01/2022 FINDINGS: CT HEAD FINDINGS Brain: There is no mass, hemorrhage or extra-axial collection. There is hypoattenuation of the supratentorial white matter. CSF spaces are normal. Vascular: Calcific atherosclerosis of the carotid and vertebral arteries at the skull base. Skull: Normal Sinuses/Orbits: Paranasal sinuses are clear. No mastoid effusion. Normal orbits. Other: None CT CERVICAL SPINE FINDINGS Alignment: Normal Skull base and vertebrae: No fracture. Soft tissues and spinal canal: Negative Disc levels:  No spinal canal stenosis. Upper chest: Negative. Other: None IMPRESSION: 1. No acute intracranial abnormality. 2. Chronic small vessel disease. 3. No acute fracture or static subluxation of the cervical spine. Electronically Signed   By: Deatra Robinson M.D.   On: 12/15/2022 20:48   CT Cervical Spine Wo Contrast  Result Date: 12/15/2022 CLINICAL DATA:  Head trauma EXAM: CT HEAD WITHOUT CONTRAST CT CERVICAL SPINE WITHOUT CONTRAST TECHNIQUE: Multidetector CT imaging of the head and cervical spine was performed following the standard protocol without intravenous contrast.  Multiplanar CT image reconstructions of the cervical spine were also generated. RADIATION DOSE REDUCTION: This exam was performed according to the departmental dose-optimization program which includes automated exposure control, adjustment of the mA and/or kV according to patient size and/or use of  iterative reconstruction technique. COMPARISON:  06/01/2022 FINDINGS: CT HEAD FINDINGS Brain: There is no mass, hemorrhage or extra-axial collection. There is hypoattenuation of the supratentorial white matter. CSF spaces are normal. Vascular: Calcific atherosclerosis of the carotid and vertebral arteries at the skull base. Skull: Normal Sinuses/Orbits: Paranasal sinuses are clear. No mastoid effusion. Normal orbits. Other: None CT CERVICAL SPINE FINDINGS Alignment: Normal Skull base and vertebrae: No fracture. Soft tissues and spinal canal: Negative Disc levels:  No spinal canal stenosis. Upper chest: Negative. Other: None IMPRESSION: 1. No acute intracranial abnormality. 2. Chronic small vessel disease. 3. No acute fracture or static subluxation of the cervical spine. Electronically Signed   By: Deatra Robinson M.D.   On: 12/15/2022 20:48    Assessment/Plan Encounter for staple removal [Z48.02] Area cleaned with alcohol and staple removal kit used to remove one staple. Additional staple difficult ot remove. 1 mL 1% lidocaine injected under the skin. Sharp dissection surrounding the base of the stple. Usinge forceps, staple removed from the skin. Upon removal, staple was circular and severely bent and misshapen. Patient tolerated procedure. <14ml of blood loss. Wound cleansed with Vaseline applied and bandage.   Family/ staff Communication: nursing  Labs/tests ordered:  none

## 2022-12-27 ENCOUNTER — Encounter: Payer: Self-pay | Admitting: Surgery

## 2023-01-04 ENCOUNTER — Encounter: Payer: Self-pay | Admitting: Nurse Practitioner

## 2023-01-04 ENCOUNTER — Non-Acute Institutional Stay (INDEPENDENT_AMBULATORY_CARE_PROVIDER_SITE_OTHER): Payer: Medicare PPO | Admitting: Nurse Practitioner

## 2023-01-04 DIAGNOSIS — Z0000 Encounter for general adult medical examination without abnormal findings: Secondary | ICD-10-CM | POA: Diagnosis not present

## 2023-01-04 NOTE — Progress Notes (Signed)
Subjective:   Ely Spragg is a 89 y.o. female who presents for Medicare Annual (Subsequent) preventive examination.  Visit Complete: In person at [facility]   Cardiac Risk Factors include: advanced age (>39men, >102 women);sedentary lifestyle;hypertension     Objective:    Today's Vitals   01/04/23 1241  BP: 136/63  Pulse: (!) 59  Resp: 20  Temp: (!) 96.4 F (35.8 C)  SpO2: 96%  Weight: 136 lb 12.8 oz (62.1 kg)  Height: 5\' 7"  (1.702 m)  PainSc: 0-No pain   Body mass index is 21.43 kg/m.     01/04/2023    1:02 PM 12/24/2022   10:34 AM 12/23/2022    9:33 AM 12/20/2022    2:10 PM 12/08/2022   10:12 AM 11/17/2022   10:51 AM 11/10/2022   12:26 PM  Advanced Directives  Does Patient Have a Medical Advance Directive? Yes Yes Yes Yes Yes Yes Yes  Type of Advance Directive Out of facility DNR (pink MOST or yellow form) Out of facility DNR (pink MOST or yellow form) Out of facility DNR (pink MOST or yellow form) Out of facility DNR (pink MOST or yellow form) Out of facility DNR (pink MOST or yellow form) Out of facility DNR (pink MOST or yellow form) Out of facility DNR (pink MOST or yellow form)  Does patient want to make changes to medical advance directive? No - Patient declined No - Patient declined No - Patient declined No - Patient declined No - Patient declined No - Patient declined No - Patient declined    Current Medications (verified) Outpatient Encounter Medications as of 01/04/2023  Medication Sig   acetaminophen (TYLENOL) 500 MG tablet Take 1,000 mg by mouth every 8 (eight) hours as needed.   amLODipine (NORVASC) 10 MG tablet Take 1 tablet (10 mg total) by mouth daily.   atorvastatin (LIPITOR) 20 MG tablet Take 20 mg by mouth daily.   Calcium Carbonate (CALCIUM 600 PO) Take 600 mg by mouth in the morning and at bedtime.   carvedilol (COREG) 12.5 MG tablet Take 1 tablet (12.5 mg total) by mouth 2 (two) times daily with a meal.   folic acid (FOLVITE) 1 MG tablet Take 1  mg by mouth daily.   hydrALAZINE (APRESOLINE) 10 MG tablet Take 1 tablet (10 mg total) by mouth daily as needed (for SBP > 130).   levothyroxine (SYNTHROID, LEVOTHROID) 75 MCG tablet Take 75 mcg by mouth at bedtime.   losartan (COZAAR) 100 MG tablet Take 1 tablet (100 mg total) by mouth daily.   methotrexate (RHEUMATREX) 2.5 MG tablet Take 15 mg by mouth every Friday.   Multiple Vitamins-Minerals (MULTIVITAMINS THER. W/MINERALS) TABS tablet Take 1 tablet by mouth daily.   nitrofurantoin, macrocrystal-monohydrate, (MACROBID) 100 MG capsule Take 1 capsule (100 mg total) by mouth daily.   predniSONE (DELTASONE) 2.5 MG tablet Take 2.5 mg by mouth daily.   Propylene Glycol (SYSTANE BALANCE OP) PRN order: Instill one drop in both eyes every 12 hours as needed for dry eyes.   senna (SENOKOT) 8.6 MG TABS tablet Take 1 tablet by mouth daily.   sertraline (ZOLOFT) 50 MG tablet Take 100 mg by mouth at bedtime.   triamcinolone cream (KENALOG) 0.1 % Apply 1 Application topically as directed. Apply to trunk, arms and legs 3 days a week Monday, Wednesday, Friday, for atopic dermatitis and stasis dermatitis, avoid face, groin, axilla   trimethoprim (TRIMPEX) 100 MG tablet Take 100 mg by mouth daily.   White Petrolatum (  VASELINE EX) Apply to scalp topically every evening shift for laceration   No facility-administered encounter medications on file as of 01/04/2023.    Allergies (verified) Amoxicillin, Codeine, Ibandronate sodium  [ibandronic acid], Risedronate, and Sulfa antibiotics   History: Past Medical History:  Diagnosis Date   Actinic keratosis    Arthritis    Basal cell carcinoma    Left chin, right nasolabial   Collagen vascular disease (HCC)    RA   Heartburn    Hypertension    Squamous cell carcinoma of skin 05/06/2021   left hand, treated with [facility]   Past Surgical History:  Procedure Laterality Date   ABDOMINAL HYSTERECTOMY     BREAST CYST EXCISION     HIP ARTHROPLASTY Right 11/30/2019    Procedure: ARTHROPLASTY BIPOLAR HIP (HEMIARTHROPLASTY);  Surgeon: Christena Flake, MD;  Location: [facility] ORS;  Service: Orthopedics;  Laterality: Right;   SACROPLASTY N/A 12/17/2020   Procedure: SACROPLASTY;  Surgeon: Kennedy Bucker, MD;  Location: [facility] ORS;  Service: Orthopedics;  Laterality: N/A;   THORACIC AORTIC ENDOVASCULAR STENT GRAFT N/A 04/11/2019   Procedure: THORACIC AORTIC ENDOVASCULAR STENT GRAFT insertion;  Surgeon: Nada Libman, MD;  Location: [facility] OR;  Service: Vascular;  Laterality: N/A;   ULTRASOUND GUIDANCE FOR VASCULAR ACCESS Right 04/11/2019   Procedure: Ultrasound Guidance For Vascular Access, right femoral artery;  Surgeon: Nada Libman, MD;  Location: [facility] OR;  Service: Vascular;  Laterality: Right;   Family History  Problem Relation Age of Onset   Kidney disease Neg Hx    Bladder Cancer Neg Hx    Social History   Socioeconomic History   Marital status: Widowed    Spouse name: Not on file   Number of children: Not on file   Years of education: Not on file   Highest education level: Not on file  Occupational History   Not on file  Tobacco Use   Smoking status: Never   Smokeless tobacco: Never  Vaping Use   Vaping status: Never Used  Substance and Sexual Activity   Alcohol use: No    Alcohol/week: 0.0 standard drinks of alcohol   Drug use: Never   Sexual activity: Not on file  Other Topics Concern   Not on file  Social History Narrative   Not on file   Social Determinants of Health   Financial Resource Strain: Not on file  Food Insecurity: No Food Insecurity (06/03/2022)   Hunger Vital Sign    Worried About Running Out of Food in the Last Year: Never true    Ran Out of Food in the Last Year: Never true  Transportation Needs: No Transportation Needs (06/03/2022)   PRAPARE - Administrator, Civil Service (Medical): No    Lack of Transportation (Non-Medical): No  Physical Activity: Not on file  Stress: Not on file  Social Connections: Not on  file    Tobacco Counseling Counseling given: Not Answered   Clinical Intake:  Pre-visit preparation completed: Yes  Pain : No/denies pain Pain Score: 0-No pain     BMI - recorded: 21 Nutritional Status: BMI of 19-24  Normal Nutritional Risks: Unintentional weight loss Diabetes: No  How often do you need to have someone help you when you read instructions, pamphlets, or other written materials from your doctor or pharmacy?: 4 - Often         Activities of Daily Living    01/04/2023   12:40 PM 06/03/2022    2:00 PM  In  your present state of health, do you have any difficulty performing the following activities:  Hearing? 1 0  Vision? 0 0  Difficulty concentrating or making decisions? 1 0  Walking or climbing stairs? 1 1  Dressing or bathing? 1 1  Doing errands, shopping? 1 0  Preparing Food and eating ? Y   Using the Toilet? Y   In the past six months, have you accidently leaked urine? Y   Do you have problems with loss of bowel control? Y   Managing your Medications? Y   Managing your Finances? Y   Housekeeping or managing your Housekeeping? Y     Patient Care Team: Earnestine Mealing, MD as PCP - General (Family Medicine)  Indicate any recent Medical Services you may have received from other than [facility] providers in the past year (date may be approximate).     Assessment:   This is a routine wellness examination for Corie.  Hearing/Vision screen No results found.   Goals Addressed   None    Depression Screen     No data to display          Fall Risk    01/04/2023   12:42 PM  Fall Risk   Falls in the past year? 1  Number falls in past yr: 1  Injury with Fall? 1    MEDICARE RISK AT HOME: Medicare Risk at Home Any stairs in or around the home?: No If so, are there any without handrails?: No Home free of loose throw rugs in walkways, pet beds, electrical cords, etc?: Yes Adequate lighting in your home to reduce risk of falls?: Yes Life  alert?: No Use of a cane, walker or w/c?: Yes Grab bars in the bathroom?: Yes Shower chair or bench in shower?: Yes Elevated toilet seat or a handicapped toilet?: Yes  TIMED UP AND GO:  Was the test performed?  No    Cognitive Function:        Immunizations Immunization History  Administered Date(s) Administered   Influenza Split 01/21/2014, 12/04/2015   Influenza, High Dose Seasonal PF 01/31/2017, 12/18/2018   Influenza-Unspecified 01/27/2015, 12/18/2018, 01/01/2020, 01/05/2021, 01/19/2022   Moderna Covid-19 Vaccine Bivalent Booster 60yrs & up 09/01/2021   Moderna Sars-Covid-2 Vaccination 04/23/2019, 05/21/2019, 02/19/2020, 07/11/2020, 01/29/2021   Pneumococcal Conjugate-13 03/15/2014   Pneumococcal Polysaccharide-23 04/05/2001, 08/13/2016   RSV,unspecified 01/19/2022   Td 07/04/2005   Td (Adult),5 Lf Tetanus Toxid, Preservative Free 02/15/2017   Tdap 07/27/2005, 02/15/2017   Zoster Recombinant(Shingrix) 12/29/2018, 06/10/2019   Zoster, Live 04/05/2006    TDAP status: Up to date  Flu Vaccine status: Due, Education has been provided regarding the importance of this vaccine. Advised may receive this vaccine at local pharmacy or Health Dept. Aware to provide a copy of the vaccination record if obtained from local pharmacy or Health Dept. Verbalized acceptance and understanding.  Pneumococcal vaccine status: Up to date  Covid-19 vaccine status: Information provided on how to obtain vaccines.   Qualifies for Shingles Vaccine? Yes   Zostavax completed No   Shingrix Completed?: Yes  Screening Tests Health Maintenance  Topic Date Due   INFLUENZA VACCINE  11/04/2022   COVID-19 Vaccine (7 - 2023-24 season) 12/05/2022   Medicare Annual Wellness (AWV)  01/04/2024   DTaP/Tdap/Td (5 - Td or Tdap) 02/16/2027   Pneumonia Vaccine 9+ Years old  Completed   Zoster Vaccines- Shingrix  Completed   HPV VACCINES  Aged Out   DEXA SCAN  Discontinued    Health Maintenance  Health  Maintenance Due  Topic Date Due   INFLUENZA VACCINE  11/04/2022   COVID-19 Vaccine (7 - 2023-24 season) 12/05/2022    Colorectal cancer screening: No longer required.   Mammogram status: No longer required due to age.   Lung Cancer Screening: (Low Dose CT Chest recommended if Age 54-80 years, 20 pack-year currently smoking OR have quit w/in 15years.) does not qualify.   Lung Cancer Screening Referral: na  Additional Screening:  Hepatitis C Screening: does not qualify  Vision Screening: Recommended annual ophthalmology exams for early detection of glaucoma and other disorders of the eye. Is the patient up to date with their annual eye exam?  Yes  Who is the provider or what is the name of the office in which the patient attends annual eye exams? In house If pt is not established with a provider, would they like to be referred to a provider to establish care? No .   Dental Screening: Recommended annual dental exams for proper oral hygiene    Community Resource Referral / Chronic Care Management: CRR required this visit?  No   CCM required this visit?  No     Plan:     I have personally reviewed and noted the following in the patient's chart:   Medical and social history Use of alcohol, tobacco or illicit drugs  Current medications and supplements including opioid prescriptions. Patient is not currently taking opioid prescriptions. Functional ability and status Nutritional status Physical activity Advanced directives List of other physicians Hospitalizations, surgeries, and ER visits in previous 12 months Vitals Screenings to include cognitive, depression, and falls Referrals and appointments  In addition, I have reviewed and discussed with patient certain preventive protocols, quality metrics, and best practice recommendations. A written personalized care plan for preventive services as well as general preventive health recommendations were provided to patient.      Sharon Seller, NP   01/04/2023

## 2023-01-06 LAB — TSH: TSH: 6.3 — AB (ref 0.41–5.90)

## 2023-01-20 ENCOUNTER — Non-Acute Institutional Stay (SKILLED_NURSING_FACILITY): Payer: Medicare PPO | Admitting: Nurse Practitioner

## 2023-01-20 ENCOUNTER — Encounter: Payer: Self-pay | Admitting: Nurse Practitioner

## 2023-01-20 DIAGNOSIS — M79605 Pain in left leg: Secondary | ICD-10-CM

## 2023-01-20 NOTE — Progress Notes (Signed)
Location:  Other [place].  Nursing Home Room Number: [facility] [address]A Place of Service:  SNF [phone]) Abbey Chatters, NP   PCP: Earnestine Mealing, MD  Patient Care Team: Earnestine Mealing, MD as PCP - General [facility] Medicine)  Extended Emergency Contact Information Primary Emergency Contact: [name] [place] of [place] Home Phone: [phone] Relation: Daughter Secondary Emergency Contact: Aurelio Jew Home Phone: [phone] Mobile Phone: [phone] Relation: Son  Goals of care: Advanced Directive information    01/20/2023   12:58 PM  Advanced Directives  Does Patient Have a Medical Advance Directive? Yes  Type of Advance Directive Out of facility DNR (pink MOST or yellow form)  Does patient want to make changes to medical advance directive? No - Patient declined     Chief Complaint  Patient presents with   Acute Visit    Left Leg Pain.     HPI:  Pt is a 89 y.o. female seen today for an acute visit for left leg pain. Patient seen at [facility].  Per nursing staff patient started complaining of left leg pain early this morning. Patient had a fall 6 days ago and denied pain at the time. Patient denies pain at the time of visit. She is confused, oriented to person, disoriented to time and situation. States she's at her daughter's house and doesn't know what year it is. Received tylenol this morning.  Denies trouble walking. Gets up and uses walker while in room.  No pain with ambulation.   Past Medical History:  Diagnosis Date   Actinic keratosis    Arthritis    Basal cell carcinoma    Left chin, right nasolabial   Collagen vascular disease (HCC)    RA   Heartburn    Hypertension    Squamous cell carcinoma of skin 05/06/2021   left hand, treated with [facility]   Past Surgical History:  Procedure Laterality Date   ABDOMINAL HYSTERECTOMY     BREAST CYST EXCISION     HIP ARTHROPLASTY Right 11/30/2019   Procedure: ARTHROPLASTY BIPOLAR HIP  (HEMIARTHROPLASTY);  Surgeon: Christena Flake, MD;  Location: [facility] ORS;  Service: Orthopedics;  Laterality: Right;   SACROPLASTY N/A 12/17/2020   Procedure: SACROPLASTY;  Surgeon: Kennedy Bucker, MD;  Location: [facility] ORS;  Service: Orthopedics;  Laterality: N/A;   THORACIC AORTIC ENDOVASCULAR STENT GRAFT N/A 04/11/2019   Procedure: THORACIC AORTIC ENDOVASCULAR STENT GRAFT insertion;  Surgeon: Nada Libman, MD;  Location: [facility] OR;  Service: Vascular;  Laterality: N/A;   ULTRASOUND GUIDANCE FOR VASCULAR ACCESS Right 04/11/2019   Procedure: Ultrasound Guidance For Vascular Access, right femoral artery;  Surgeon: Nada Libman, MD;  Location: [facility] OR;  Service: Vascular;  Laterality: Right;    Allergies  Allergen Reactions   Amoxicillin Other (See Comments)    Intolerant  Daughter and Son report patient tolerated course of Augmentin in January 2024   Codeine Nausea Only   Ibandronate Sodium  [Ibandronic Acid] Other (See Comments)    Gi upset   Risedronate Other (See Comments)    Gi upset   Sulfa Antibiotics Hives    Outpatient Encounter Medications as of 01/20/2023  Medication Sig   acetaminophen (TYLENOL) 500 MG tablet Take 1,000 mg by mouth every 8 (eight) hours as needed.   amLODipine (NORVASC) 10 MG tablet Take 1 tablet (10 mg total) by mouth daily.   atorvastatin (LIPITOR) 20 MG tablet Take 20 mg by mouth daily.   Calcium Carbonate (CALCIUM 600 PO) Take 600 mg by  mouth in the morning and at bedtime.   carvedilol (COREG) 12.5 MG tablet Take 1 tablet (12.5 mg total) by mouth 2 (two) times daily with a meal.   folic acid (FOLVITE) 1 MG tablet Take 1 mg by mouth daily.   hydrALAZINE (APRESOLINE) 10 MG tablet Take 1 tablet (10 mg total) by mouth daily as needed (for SBP > 130).   levothyroxine (SYNTHROID, LEVOTHROID) 75 MCG tablet Take 75 mcg by mouth at bedtime.   losartan (COZAAR) 100 MG tablet Take 1 tablet (100 mg total) by mouth daily.   methotrexate (RHEUMATREX) 2.5 MG tablet Take 15 mg  by mouth every Friday.   Multiple Vitamins-Minerals (MULTIVITAMINS THER. W/MINERALS) TABS tablet Take 1 tablet by mouth daily.   predniSONE (DELTASONE) 2.5 MG tablet Take 2.5 mg by mouth daily.   Propylene Glycol (SYSTANE BALANCE OP) PRN order: Instill one drop in both eyes every 12 hours as needed for dry eyes.   senna (SENOKOT) 8.6 MG TABS tablet Take 1 tablet by mouth daily.   sertraline (ZOLOFT) 50 MG tablet Take 100 mg by mouth at bedtime.   triamcinolone cream (KENALOG) 0.1 % Apply 1 Application topically as directed. Apply to trunk, arms and legs 3 days a week Monday, Wednesday, Friday, for atopic dermatitis and stasis dermatitis, avoid face, groin, axilla   trimethoprim (TRIMPEX) 100 MG tablet Take 100 mg by mouth daily.   White Petrolatum (VASELINE EX) Apply to scalp topically every evening shift for laceration   [DISCONTINUED] nitrofurantoin, macrocrystal-monohydrate, (MACROBID) 100 MG capsule Take 1 capsule (100 mg total) by mouth daily.   No facility-administered encounter medications on file as of 01/20/2023.    Review of Systems  Constitutional:  Negative for activity change.  Musculoskeletal:  Negative for arthralgias, gait problem and myalgias.  Neurological:  Negative for weakness and numbness.    Immunization History  Administered Date(s) Administered   Influenza Split 01/21/2014, 12/04/2015   Influenza, High Dose Seasonal PF 01/31/2017, 12/18/2018   Influenza-Unspecified 01/27/2015, 12/18/2018, 01/01/2020, 01/05/2021, 01/19/2022   Moderna Covid-19 Vaccine Bivalent Booster 16yrs & up 09/01/2021   Moderna Sars-Covid-2 Vaccination 04/23/2019, 05/21/2019, 02/19/2020, 07/11/2020, 01/29/2021   Pneumococcal Conjugate-13 03/15/2014   Pneumococcal Polysaccharide-23 04/05/2001, 08/13/2016   RSV,unspecified 01/19/2022   Td 07/04/2005   Td (Adult),5 Lf Tetanus Toxid, Preservative Free 02/15/2017   Tdap 07/27/2005, 02/15/2017   Zoster Recombinant(Shingrix) 12/29/2018,  06/10/2019   Zoster, Live 04/05/2006   Pertinent  Health Maintenance Due  Topic Date Due   INFLUENZA VACCINE  11/04/2022   DEXA SCAN  Discontinued      12/17/2020    2:42 PM 12/18/2020    2:00 AM 12/18/2020    1:00 PM 03/28/2022    6:10 AM 01/04/2023   12:42 PM  Fall Risk  Falls in the past year?     1  Was there an injury with Fall?     1  Fall Risk Category Calculator     3  (RETIRED) Patient Fall Risk Level High fall risk High fall risk High fall risk High fall risk    Functional Status Survey:    Vitals:   01/20/23 1253 01/20/23 1304  BP: (!) 151/72 133/65  Pulse: 64   Resp: 16   Temp: 97.9 F (36.6 C)   SpO2: 93%   Weight: 137 lb 6.4 oz (62.3 kg)   Height: 5\' 7"  (1.702 m)    Body mass index is 21.52 kg/m. Physical Exam Constitutional:      General: She is not  in acute distress.    Appearance: Normal appearance. She is not ill-appearing or toxic-appearing.  Cardiovascular:     Rate and Rhythm: Normal rate and regular rhythm.     Pulses: Normal pulses.     Heart sounds: Normal heart sounds.  Pulmonary:     Effort: Pulmonary effort is normal.     Breath sounds: Normal breath sounds.  Abdominal:     General: Bowel sounds are normal.     Palpations: Abdomen is soft.  Musculoskeletal:        General: No swelling or tenderness. Normal range of motion.     Right lower leg: No edema.     Left lower leg: No edema.  Skin:    General: Skin is warm and dry.     Findings: Bruising (below left knee) present.     Comments: Right shin skin tear with steri-strips present  Neurological:     Mental Status: She is alert. Mental status is at baseline. She is disoriented.     Motor: No weakness.     Gait: Gait normal.  Psychiatric:        Behavior: Behavior normal.     Labs reviewed: Recent Labs    10/12/22 0127 10/13/22 0132 10/14/22 0157 10/15/22 0112 10/28/22 0000 11/04/22 0000 11/18/22 0000 12/09/22 0000 12/15/22 1907  NA 133* 131* 132* 134* 137  --  143   --  137  K 3.9 3.2* 3.9 3.9 3.3*   < > 3.3* 3.3* 3.8  CL 104 104 105 106 103  --  107  --  105  CO2 20* 20* 20* 21* 27*  --  29*  --  22  GLUCOSE 104* 106* 108* 112*  --   --   --   --  110*  BUN 18 20 20 17 12   --  10  --  18  CREATININE 0.86 0.74 0.68 0.66 0.6  --  0.6  --  0.56  CALCIUM 8.2* 8.1* 8.3* 8.2* 8.2*  --  8.6*  --  8.6*  MG 1.9 2.6* 2.3  --   --   --   --   --   --   PHOS 3.3  --   --   --   --   --   --   --   --    < > = values in this interval not displayed.   Recent Labs    06/01/22 1238 06/03/22 1117 06/04/22 0416 08/10/22 0000 10/28/22 0000 11/18/22 0000  AST 25 22 17 17 16 16   ALT 16 13 10 9 19 11   ALKPHOS 60 51 43 77 58 76  BILITOT 1.3* 1.5* 1.3*  --   --   --   PROT 7.0 6.1* 5.2*  --   --   --   ALBUMIN 3.5 2.9* 2.4* 3.2* 2.7* 3.4*   Recent Labs    06/03/22 1117 06/03/22 1819 06/14/22 0000 08/10/22 0000 10/12/22 0127 10/15/22 0112 10/28/22 0000 12/15/22 1907  WBC 18.2*   < > 14.2   < > 13.9* 14.3* 7.4 9.6  NEUTROABS 15.1*  --  9,670.00  --   --   --  4,706.00  --   HGB 11.4*   < > 9.8*   < > 10.4* 10.2* 9.2* 10.0*  HCT 36.0   < > 30*   < > 32.6* 31.3* 29* 31.7*  MCV 100.6*   < >  --    < > 92.1 87.2  --  92.4  PLT 205   < > 308   < > 160 191 306 205   < > = values in this interval not displayed.   Lab Results  Component Value Date   TSH 6.30 (A) 01/06/2023   Lab Results  Component Value Date   HGBA1C 5.6 08/26/2022   Lab Results  Component Value Date   CHOL 150 12/09/2022   HDL 75 (A) 12/09/2022   LDLCALC 58 12/09/2022   TRIG 90 12/09/2022   CHOLHDL 2.5 04/07/2019    Significant Diagnostic Results in last 30 days:  No results found.  Assessment/Plan 1. Left leg pain -Patient denies further pain at this time -No limitation in ROM on exam and no pain with passive or active movement. No pain on palpitation to knees bilaterally or swelling noted.  -continue PRN tylenol -monitor and notify as needed   Rollen Sox,  MSN-FNP Student -I personally was present during the history, physical exam and medical decision-making activities of this service and have verified that the service and findings are accurately documented in the student's note Jessica K. Biagio Borg [facility] [phone]

## 2023-02-03 ENCOUNTER — Non-Acute Institutional Stay (SKILLED_NURSING_FACILITY): Payer: Medicare PPO | Admitting: Nurse Practitioner

## 2023-02-03 ENCOUNTER — Encounter: Payer: Self-pay | Admitting: Nurse Practitioner

## 2023-02-03 DIAGNOSIS — N390 Urinary tract infection, site not specified: Secondary | ICD-10-CM | POA: Diagnosis not present

## 2023-02-03 NOTE — Progress Notes (Signed)
Location:  Other [place].  Nursing Home Room Number: [facility] [address] Place of Service:  SNF ([phone]) Abbey Chatters, NP  PCP: Earnestine Mealing, MD  Patient Care Team: Earnestine Mealing, MD as PCP - General [facility] Medicine)  Extended Emergency Contact Information Primary Emergency Contact: [name] [place] of [place] Home Phone: [phone] Relation: Daughter Secondary Emergency Contact: Aurelio Jew Home Phone: [phone] Mobile Phone: [phone] Relation: Son  Goals of care: Advanced Directive information    02/03/2023    9:32 AM  Advanced Directives  Does Patient Have a Medical Advance Directive? Yes  Type of Advance Directive Out of facility DNR (pink MOST or yellow form)  Does patient want to make changes to medical advance directive? No - Patient declined     Chief Complaint  Patient presents with   Acute Visit    Burning with Urination.     HPI:  Pt is a 89 y.o. female seen today for an acute visit for Burning with Urination however when questing patient about her symptoms she denies any burning or pain with urination. Reports she has frequency and a hx of cystitis and does not know if this is something to be concerned about.  Denies fever, chills or abdominal pain.  Staff has not reported any increase in frequency of urination.    Past Medical History:  Diagnosis Date   Actinic keratosis    Arthritis    Basal cell carcinoma    Left chin, right nasolabial   Collagen vascular disease (HCC)    RA   Heartburn    Hypertension    Squamous cell carcinoma of skin 05/06/2021   left hand, treated with [facility]   Past Surgical History:  Procedure Laterality Date   ABDOMINAL HYSTERECTOMY     BREAST CYST EXCISION     HIP ARTHROPLASTY Right 11/30/2019   Procedure: ARTHROPLASTY BIPOLAR HIP (HEMIARTHROPLASTY);  Surgeon: Christena Flake, MD;  Location: [facility] ORS;  Service: Orthopedics;  Laterality: Right;   SACROPLASTY N/A 12/17/2020   Procedure:  SACROPLASTY;  Surgeon: Kennedy Bucker, MD;  Location: [facility] ORS;  Service: Orthopedics;  Laterality: N/A;   THORACIC AORTIC ENDOVASCULAR STENT GRAFT N/A 04/11/2019   Procedure: THORACIC AORTIC ENDOVASCULAR STENT GRAFT insertion;  Surgeon: Nada Libman, MD;  Location: [facility] OR;  Service: Vascular;  Laterality: N/A;   ULTRASOUND GUIDANCE FOR VASCULAR ACCESS Right 04/11/2019   Procedure: Ultrasound Guidance For Vascular Access, right femoral artery;  Surgeon: Nada Libman, MD;  Location: [facility] OR;  Service: Vascular;  Laterality: Right;    Allergies  Allergen Reactions   Amoxicillin Other (See Comments)    Intolerant  Daughter and Son report patient tolerated course of Augmentin in January 2024   Codeine Nausea Only   Ibandronate Sodium  [Ibandronic Acid] Other (See Comments)    Gi upset   Risedronate Other (See Comments)    Gi upset   Sulfa Antibiotics Hives    Outpatient Encounter Medications as of 02/03/2023  Medication Sig   acetaminophen (TYLENOL) 500 MG tablet Take 1,000 mg by mouth every 8 (eight) hours as needed.   amLODipine (NORVASC) 10 MG tablet Take 1 tablet (10 mg total) by mouth daily.   atorvastatin (LIPITOR) 20 MG tablet Take 20 mg by mouth daily.   Calcium Carbonate (CALCIUM 600 PO) Take 600 mg by mouth in the morning and at bedtime.   carvedilol (COREG) 12.5 MG tablet Take 1 tablet (12.5 mg total) by mouth 2 (two) times daily with a meal.  folic acid (FOLVITE) 1 MG tablet Take 1 mg by mouth daily.   hydrALAZINE (APRESOLINE) 10 MG tablet Take 10 mg by mouth every 8 (eight) hours as needed.   levothyroxine (SYNTHROID, LEVOTHROID) 75 MCG tablet Take 75 mcg by mouth at bedtime.   losartan (COZAAR) 100 MG tablet Take 1 tablet (100 mg total) by mouth daily.   methotrexate (RHEUMATREX) 2.5 MG tablet Take 15 mg by mouth every Friday.   Multiple Vitamins-Minerals (MULTIVITAMINS THER. W/MINERALS) TABS tablet Take 1 tablet by mouth daily.   predniSONE (DELTASONE) 2.5 MG tablet Take  2.5 mg by mouth daily.   Propylene Glycol (SYSTANE BALANCE OP) PRN order: Instill one drop in both eyes every 12 hours as needed for dry eyes.   senna (SENOKOT) 8.6 MG TABS tablet Take 1 tablet by mouth daily.   sertraline (ZOLOFT) 50 MG tablet Take 100 mg by mouth at bedtime.   triamcinolone cream (KENALOG) 0.1 % Apply 1 Application topically as directed. Apply to trunk, arms and legs 3 days a week Monday, Wednesday, Friday, for atopic dermatitis and stasis dermatitis, avoid face, groin, axilla   trimethoprim (TRIMPEX) 100 MG tablet Take 100 mg by mouth daily.   White Petrolatum (VASELINE EX) Apply to scalp topically every evening shift for laceration   [DISCONTINUED] hydrALAZINE (APRESOLINE) 10 MG tablet Take 1 tablet (10 mg total) by mouth daily as needed (for SBP > 130). (Patient taking differently: Take 10 mg by mouth every 8 (eight) hours as needed (for SBP > 130).)   No facility-administered encounter medications on file as of 02/03/2023.    Review of Systems  Constitutional:  Negative for activity change, appetite change, fatigue and fever.  Genitourinary:  Negative for dysuria, flank pain, hematuria, pelvic pain and urgency.       Frequency.   Psychiatric/Behavioral: Negative.      Immunization History  Administered Date(s) Administered   Influenza Split 01/21/2014, 12/04/2015   Influenza, High Dose Seasonal PF 01/31/2017, 12/18/2018   Influenza-Unspecified 01/27/2015, 12/18/2018, 01/01/2020, 01/05/2021, 01/19/2022, 01/26/2023   Moderna Covid-19 Vaccine Bivalent Booster 56yrs & up 09/01/2021   Moderna Sars-Covid-2 Vaccination 04/23/2019, 05/21/2019, 02/19/2020, 07/11/2020, 01/29/2021   Pneumococcal Conjugate-13 03/15/2014   Pneumococcal Polysaccharide-23 04/05/2001, 08/13/2016   RSV,unspecified 01/19/2022   Td 07/04/2005   Td (Adult),5 Lf Tetanus Toxid, Preservative Free 02/15/2017   Tdap 07/27/2005, 02/15/2017   Zoster Recombinant(Shingrix) 12/29/2018, 06/10/2019   Zoster,  Live 04/05/2006   Pertinent  Health Maintenance Due  Topic Date Due   INFLUENZA VACCINE  Completed   DEXA SCAN  Discontinued      12/17/2020    2:42 PM 12/18/2020    2:00 AM 12/18/2020    1:00 PM 03/28/2022    6:10 AM 01/04/2023   12:42 PM  Fall Risk  Falls in the past year?     1  Was there an injury with Fall?     1  Fall Risk Category Calculator     3  (RETIRED) Patient Fall Risk Level High fall risk High fall risk High fall risk High fall risk    Functional Status Survey:    Vitals:   02/03/23 0925  BP: 133/68  Pulse: 65  Resp: 18  Temp: (!) 94.1 F (34.5 C)  SpO2: 93%  Weight: 137 lb 6.4 oz (62.3 kg)  Height: 5\' 7"  (1.702 m)   Body mass index is 21.52 kg/m. Physical Exam Constitutional:      Appearance: Normal appearance.  Pulmonary:     Effort: Pulmonary  effort is normal.  Abdominal:     General: Abdomen is flat.     Palpations: Abdomen is soft.     Tenderness: There is no abdominal tenderness. There is no right CVA tenderness or left CVA tenderness.  Neurological:     Mental Status: She is alert. Mental status is at baseline.  Psychiatric:        Mood and Affect: Mood normal.     Labs reviewed: Recent Labs    10/12/22 0127 10/13/22 0132 10/14/22 0157 10/15/22 0112 10/28/22 0000 11/04/22 0000 11/18/22 0000 12/09/22 0000 12/15/22 1907  NA 133* 131* 132* 134* 137  --  143  --  137  K 3.9 3.2* 3.9 3.9 3.3*   < > 3.3* 3.3* 3.8  CL 104 104 105 106 103  --  107  --  105  CO2 20* 20* 20* 21* 27*  --  29*  --  22  GLUCOSE 104* 106* 108* 112*  --   --   --   --  110*  BUN 18 20 20 17 12   --  10  --  18  CREATININE 0.86 0.74 0.68 0.66 0.6  --  0.6  --  0.56  CALCIUM 8.2* 8.1* 8.3* 8.2* 8.2*  --  8.6*  --  8.6*  MG 1.9 2.6* 2.3  --   --   --   --   --   --   PHOS 3.3  --   --   --   --   --   --   --   --    < > = values in this interval not displayed.   Recent Labs    06/01/22 1238 06/03/22 1117 06/04/22 0416 08/10/22 0000 10/28/22 0000  11/18/22 0000  AST 25 22 17 17 16 16   ALT 16 13 10 9 19 11   ALKPHOS 60 51 43 77 58 76  BILITOT 1.3* 1.5* 1.3*  --   --   --   PROT 7.0 6.1* 5.2*  --   --   --   ALBUMIN 3.5 2.9* 2.4* 3.2* 2.7* 3.4*   Recent Labs    06/03/22 1117 06/03/22 1819 06/14/22 0000 08/10/22 0000 10/12/22 0127 10/15/22 0112 10/28/22 0000 12/15/22 1907  WBC 18.2*   < > 14.2   < > 13.9* 14.3* 7.4 9.6  NEUTROABS 15.1*  --  9,670.00  --   --   --  4,706.00  --   HGB 11.4*   < > 9.8*   < > 10.4* 10.2* 9.2* 10.0*  HCT 36.0   < > 30*   < > 32.6* 31.3* 29* 31.7*  MCV 100.6*   < >  --    < > 92.1 87.2  --  92.4  PLT 205   < > 308   < > 160 191 306 205   < > = values in this interval not displayed.   Lab Results  Component Value Date   TSH 6.30 (A) 01/06/2023   Lab Results  Component Value Date   HGBA1C 5.6 08/26/2022   Lab Results  Component Value Date   CHOL 150 12/09/2022   HDL 75 (A) 12/09/2022   LDLCALC 58 12/09/2022   TRIG 90 12/09/2022   CHOLHDL 2.5 04/07/2019    Significant Diagnostic Results in last 30 days:  No results found.  Assessment/Plan 1. Recurrent UTI Pt without any pain or discomfort on urination at this time No fever, chills, abdominal pain.  In  usual state of health at this time.  Reports frequency but unsure if this is more than baseline due to her dementia she is a poor historian.  Staff will continue to monitor and notify Continues on trimethoprim for daily preventative.  Consider medication of OAB if frequency is a concern.    Janene Harvey. Biagio Borg [facility] [phone]

## 2023-02-10 ENCOUNTER — Encounter: Payer: Self-pay | Admitting: Nurse Practitioner

## 2023-02-10 ENCOUNTER — Non-Acute Institutional Stay (SKILLED_NURSING_FACILITY): Payer: Medicare PPO | Admitting: Nurse Practitioner

## 2023-02-10 DIAGNOSIS — M545 Low back pain, unspecified: Secondary | ICD-10-CM

## 2023-02-10 DIAGNOSIS — R296 Repeated falls: Secondary | ICD-10-CM | POA: Diagnosis not present

## 2023-02-10 MED ORDER — TRAMADOL HCL 50 MG PO TABS
50.0000 mg | ORAL_TABLET | Freq: Four times a day (QID) | ORAL | 0 refills | Status: DC | PRN
Start: 2023-02-10 — End: 2023-02-15

## 2023-02-10 NOTE — Progress Notes (Signed)
Location:  Other [place].  Nursing Home Room Number: [facility] [address]A Place of Service:  SNF [phone]) Abbey Chatters, NP  [id number], Benetta Spar, MD  Patient Care Team: Earnestine Mealing, MD as PCP - General [facility])  Extended Emergency Contact Information Primary Emergency Contact: Verna Czech of [place] Home Phone: [phone] Relation: Daughter Secondary Emergency Contact: Aurelio Jew Home Phone: [phone] Mobile Phone: [phone] Relation: Son  Goals of care: Advanced Directive information    02/10/2023    3:17 PM  Advanced Directives  Does Patient Have a Medical Advance Directive? Yes  Type of Advance Directive Out of facility DNR (pink MOST or yellow form)  Does patient want to make changes to medical advance directive? No - Patient declined     Chief Complaint  Patient presents with   Acute Visit    Pain from Fall.     HPI:  Pt is a 89 y.o. female seen today for an acute visit for Pain From Fall.  Last night she was eating dinner and got up to go to the restroom and fell when she was in the bathroom.  She reported her sacral area was hurting when she had the fall.  Now she reports she hurts all over.  She reports she was in extreme pain until she got tramadol which has helped the pain. She has hx of multiple falls and under high fall precautions.   No reports that she hit her head No dizziness or headaches.   Past Medical History:  Diagnosis Date   Actinic keratosis    Arthritis    Basal cell carcinoma    Left chin, right nasolabial   Collagen vascular disease (HCC)    RA   Heartburn    Hypertension    Squamous cell carcinoma of skin 05/06/2021   left hand, treated with [facility]   Past Surgical History:  Procedure Laterality Date   ABDOMINAL HYSTERECTOMY     BREAST CYST EXCISION     HIP ARTHROPLASTY Right 11/30/2019   Procedure: ARTHROPLASTY BIPOLAR HIP (HEMIARTHROPLASTY);  Surgeon: Christena Flake, MD;  Location: [facility]  ORS;  Service: Orthopedics;  Laterality: Right;   SACROPLASTY N/A 12/17/2020   Procedure: SACROPLASTY;  Surgeon: Kennedy Bucker, MD;  Location: [facility] ORS;  Service: Orthopedics;  Laterality: N/A;   THORACIC AORTIC ENDOVASCULAR STENT GRAFT N/A 04/11/2019   Procedure: THORACIC AORTIC ENDOVASCULAR STENT GRAFT insertion;  Surgeon: Nada Libman, MD;  Location: [facility] OR;  Service: Vascular;  Laterality: N/A;   ULTRASOUND GUIDANCE FOR VASCULAR ACCESS Right 04/11/2019   Procedure: Ultrasound Guidance For Vascular Access, right femoral artery;  Surgeon: Nada Libman, MD;  Location: [facility] OR;  Service: Vascular;  Laterality: Right;    Allergies  Allergen Reactions   Amoxicillin Other (See Comments)    Intolerant  Daughter and Son report patient tolerated course of Augmentin in January 2024   Codeine Nausea Only   Ibandronate Sodium  [Ibandronic Acid] Other (See Comments)    Gi upset   Risedronate Other (See Comments)    Gi upset   Sulfa Antibiotics Hives    Outpatient Encounter Medications as of 02/10/2023  Medication Sig   acetaminophen (TYLENOL) 500 MG tablet Take 1,000 mg by mouth every 8 (eight) hours as needed.   amLODipine (NORVASC) 10 MG tablet Take 1 tablet (10 mg total) by mouth daily.   atorvastatin (LIPITOR) 20 MG tablet Take 20 mg by mouth daily.   Calcium Carbonate (CALCIUM 600 PO) Take 600 mg by mouth  in the morning and at bedtime.   carvedilol (COREG) 12.5 MG tablet Take 1 tablet (12.5 mg total) by mouth 2 (two) times daily with a meal.   folic acid (FOLVITE) 1 MG tablet Take 1 mg by mouth daily.   hydrALAZINE (APRESOLINE) 10 MG tablet Take 10 mg by mouth every 8 (eight) hours as needed.   levothyroxine (SYNTHROID, LEVOTHROID) 75 MCG tablet Take 75 mcg by mouth at bedtime.   losartan (COZAAR) 100 MG tablet Take 1 tablet (100 mg total) by mouth daily.   methotrexate (RHEUMATREX) 2.5 MG tablet Take 15 mg by mouth every Friday.   Multiple Vitamins-Minerals (MULTIVITAMINS THER.  W/MINERALS) TABS tablet Take 1 tablet by mouth daily.   predniSONE (DELTASONE) 2.5 MG tablet Take 2.5 mg by mouth daily.   Propylene Glycol (SYSTANE BALANCE OP) PRN order: Instill one drop in both eyes every 12 hours as needed for dry eyes.   senna (SENOKOT) 8.6 MG TABS tablet Take 1 tablet by mouth daily.   sertraline (ZOLOFT) 50 MG tablet Take 100 mg by mouth at bedtime.   triamcinolone cream (KENALOG) 0.1 % Apply 1 Application topically as directed. Apply to trunk, arms and legs 3 days a week Monday, Wednesday, Friday, for atopic dermatitis and stasis dermatitis, avoid face, groin, axilla   trimethoprim (TRIMPEX) 100 MG tablet Take 100 mg by mouth daily.   White Petrolatum (VASELINE EX) Apply to scalp topically every evening shift for laceration   No facility-administered encounter medications on file as of 02/10/2023.    Review of Systems  Musculoskeletal:  Positive for arthralgias, back pain and gait problem.  Skin:  Negative for color change and wound.  Neurological:  Negative for dizziness and headaches.    Immunization History  Administered Date(s) Administered   Influenza Split 01/21/2014, 12/04/2015   Influenza, High Dose Seasonal PF 01/31/2017, 12/18/2018   Influenza-Unspecified 01/27/2015, 12/18/2018, 01/01/2020, 01/05/2021, 01/19/2022, 01/26/2023   Moderna Covid-19 Vaccine Bivalent Booster 25yrs & up 09/01/2021   Moderna Sars-Covid-2 Vaccination 04/23/2019, 05/21/2019, 02/19/2020, 07/11/2020, 01/29/2021   Pneumococcal Conjugate-13 03/15/2014   Pneumococcal Polysaccharide-23 04/05/2001, 08/13/2016   RSV,unspecified 01/19/2022   Td 07/04/2005   Td (Adult),5 Lf Tetanus Toxid, Preservative Free 02/15/2017   Tdap 07/27/2005, 02/15/2017   Zoster Recombinant(Shingrix) 12/29/2018, 06/10/2019   Zoster, Live 04/05/2006   Pertinent  Health Maintenance Due  Topic Date Due   INFLUENZA VACCINE  Completed   DEXA SCAN  Discontinued      12/17/2020    2:42 PM 12/18/2020    2:00 AM  12/18/2020    1:00 PM 03/28/2022    6:10 AM 01/04/2023   12:42 PM  Fall Risk  Falls in the past year?     1  Was there an injury with Fall?     1  Fall Risk Category Calculator     3  (RETIRED) Patient Fall Risk Level High fall risk High fall risk High fall risk High fall risk    Functional Status Survey:    Vitals:   02/10/23 1509  BP: (!) 118/57  Pulse: (!) 58  Resp: 18  Temp: (!) 97.2 F (36.2 C)  SpO2: 97%  Weight: 138 lb 9.6 oz (62.9 kg)  Height: 5\' 5"  (1.651 m)   Body mass index is 23.06 kg/m. Physical Exam Constitutional:      General: She is not in acute distress.    Appearance: She is well-developed. She is not diaphoretic.  HENT:     Head: Normocephalic and atraumatic.  Mouth/Throat:     Pharynx: No oropharyngeal exudate.  Eyes:     Conjunctiva/sclera: Conjunctivae normal.     Pupils: Pupils are equal, round, and reactive to light.  Cardiovascular:     Rate and Rhythm: Normal rate and regular rhythm.     Heart sounds: Normal heart sounds.  Pulmonary:     Effort: Pulmonary effort is normal.     Breath sounds: Normal breath sounds.  Abdominal:     General: Bowel sounds are normal.     Palpations: Abdomen is soft.  Musculoskeletal:     Cervical back: Normal range of motion and neck supple.     Right lower leg: No edema.     Left lower leg: No edema.     Comments: Reports mild tenderness to low back bilaterally mild tenderness to ribs under right arm No bruising or swelling noted.   Skin:    General: Skin is warm and dry.  Neurological:     Mental Status: She is alert. Mental status is at baseline.     Motor: Weakness present.     Gait: Gait abnormal.  Psychiatric:        Mood and Affect: Mood normal.    Labs reviewed: Recent Labs    10/12/22 0127 10/13/22 0132 10/14/22 0157 10/15/22 0112 10/28/22 0000 11/04/22 0000 11/18/22 0000 12/09/22 0000 12/15/22 1907  NA 133* 131* 132* 134* 137  --  143  --  137  K 3.9 3.2* 3.9 3.9 3.3*   < >  3.3* 3.3* 3.8  CL 104 104 105 106 103  --  107  --  105  CO2 20* 20* 20* 21* 27*  --  29*  --  22  GLUCOSE 104* 106* 108* 112*  --   --   --   --  110*  BUN 18 20 20 17 12   --  10  --  18  CREATININE 0.86 0.74 0.68 0.66 0.6  --  0.6  --  0.56  CALCIUM 8.2* 8.1* 8.3* 8.2* 8.2*  --  8.6*  --  8.6*  MG 1.9 2.6* 2.3  --   --   --   --   --   --   PHOS 3.3  --   --   --   --   --   --   --   --    < > = values in this interval not displayed.   Recent Labs    06/01/22 1238 06/03/22 1117 06/04/22 0416 08/10/22 0000 10/28/22 0000 11/18/22 0000  AST 25 22 17 17 16 16   ALT 16 13 10 9 19 11   ALKPHOS 60 51 43 77 58 76  BILITOT 1.3* 1.5* 1.3*  --   --   --   PROT 7.0 6.1* 5.2*  --   --   --   ALBUMIN 3.5 2.9* 2.4* 3.2* 2.7* 3.4*   Recent Labs    06/03/22 1117 06/03/22 1819 06/14/22 0000 08/10/22 0000 10/12/22 0127 10/15/22 0112 10/28/22 0000 12/15/22 1907  WBC 18.2*   < > 14.2   < > 13.9* 14.3* 7.4 9.6  NEUTROABS 15.1*  --  9,670.00  --   --   --  4,706.00  --   HGB 11.4*   < > 9.8*   < > 10.4* 10.2* 9.2* 10.0*  HCT 36.0   < > 30*   < > 32.6* 31.3* 29* 31.7*  MCV 100.6*   < >  --    < >  92.1 87.2  --  92.4  PLT 205   < > 308   < > 160 191 306 205   < > = values in this interval not displayed.   Lab Results  Component Value Date   TSH 6.30 (A) 01/06/2023   Lab Results  Component Value Date   HGBA1C 5.6 08/26/2022   Lab Results  Component Value Date   CHOL 150 12/09/2022   HDL 75 (A) 12/09/2022   LDLCALC 58 12/09/2022   TRIG 90 12/09/2022   CHOLHDL 2.5 04/07/2019    Significant Diagnostic Results in last 30 days:  No results found.  Assessment/Plan 1. Recurrent falls -continue high fall precautions   2. Acute bilateral low back pain without sciatica -after fall, no bruising or swelling noted.  Xray for lumbar spine and pelvis ordered -continue tramadol and tylenol PRN for pain -heat to low back tid after heat to use muscle rub.  - traMADol (ULTRAM) 50 MG  tablet; Take 1 tablet (50 mg total) by mouth every 6 (six) hours as needed for up to 5 days.  Dispense: 20 tablet; Refill: 0   Layne Dilauro K. Biagio Borg [facility] [phone]

## 2023-02-15 ENCOUNTER — Other Ambulatory Visit: Payer: Self-pay | Admitting: *Deleted

## 2023-02-15 ENCOUNTER — Other Ambulatory Visit: Payer: Self-pay | Admitting: Nurse Practitioner

## 2023-02-15 DIAGNOSIS — M545 Low back pain, unspecified: Secondary | ICD-10-CM

## 2023-02-16 MED ORDER — TRAMADOL HCL 50 MG PO TABS: 50.0000 mg | ORAL_TABLET | Freq: Four times a day (QID) | ORAL | 0 refills | Status: DC | PRN

## 2023-02-18 IMAGING — CT CT HEAD W/O CM
3 series · 15 of 47 positions shown, 18 images · non-contrast
Comparison: 11/30/2019

CLINICAL DATA: Neck trauma.  Unwitnessed fall.

EXAM:
CT HEAD WITHOUT CONTRAST
CT CERVICAL SPINE WITHOUT CONTRAST
TECHNIQUE: Multidetector CT imaging of the head and cervical spine was
performed following the standard protocol without intravenous
contrast. Multiplanar CT image reconstructions of the cervical spine
were also generated.

[Series 2: head wo · axial · 0.46mm/px · z∈[-180,-20]mm · 9 of 38 slices shown, 12 images]
[im 3/38  brain]
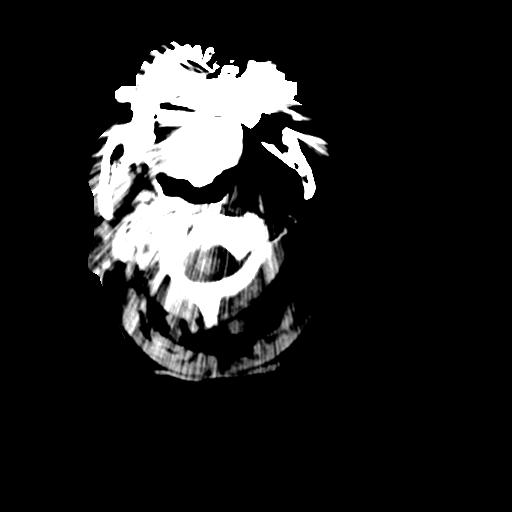
[im 3/38  bone]
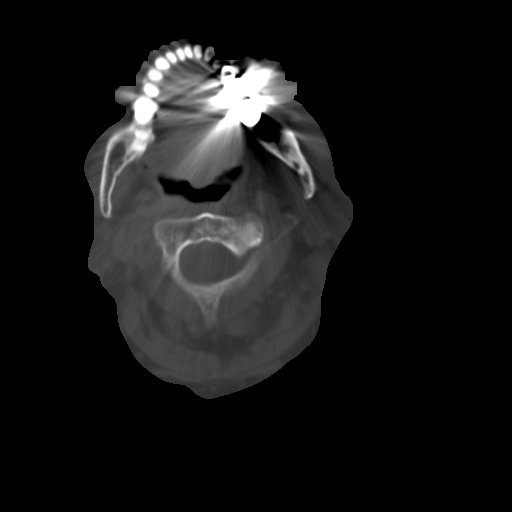
[im 7/38  brain]
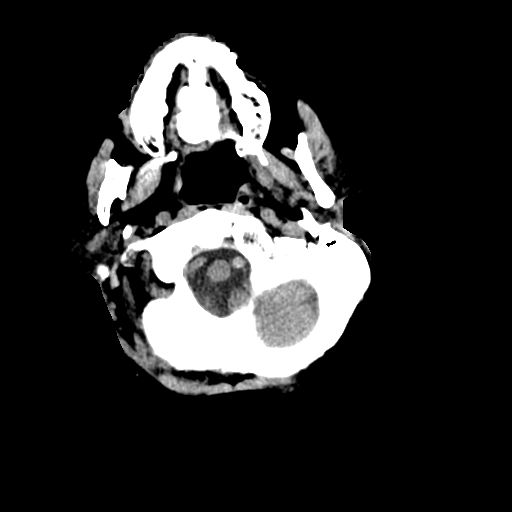
[im 11/38  brain]
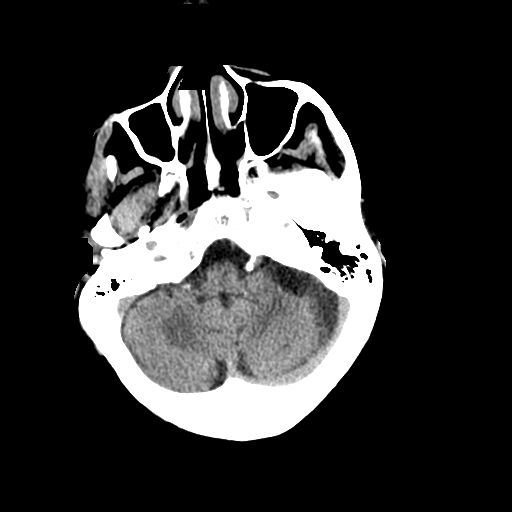
[im 15/38  brain]
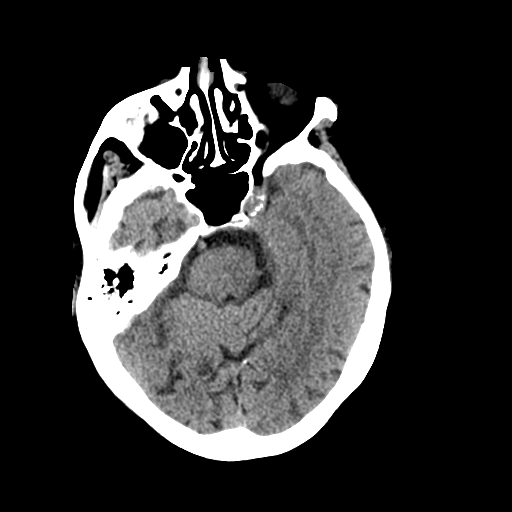
[im 20/38  brain]
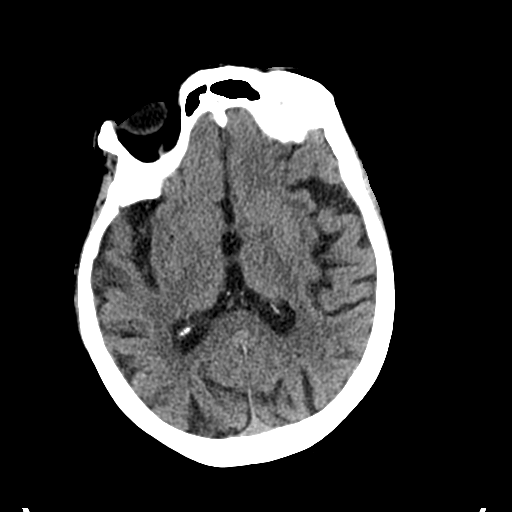
[im 20/38  bone]
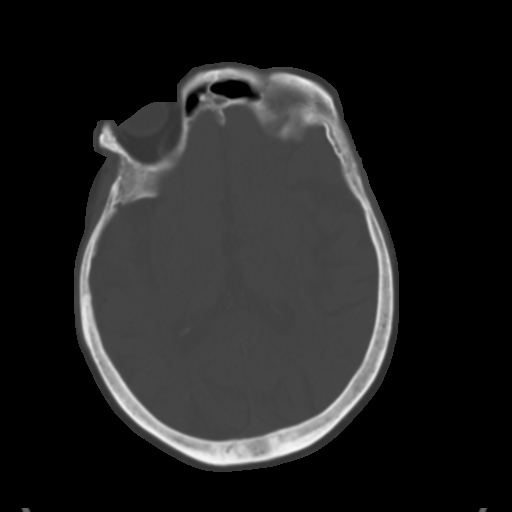
[im 23/38  brain]
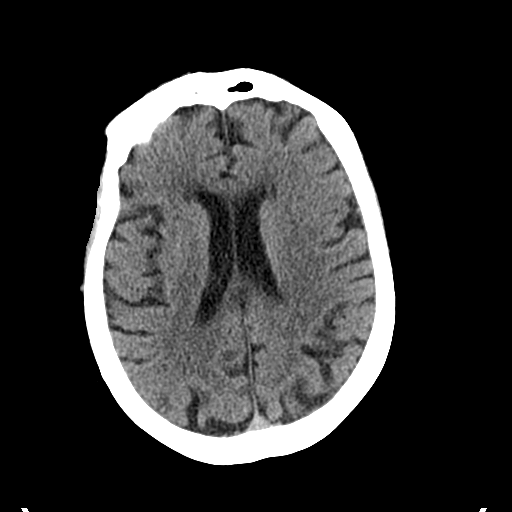
[im 27/38  brain]
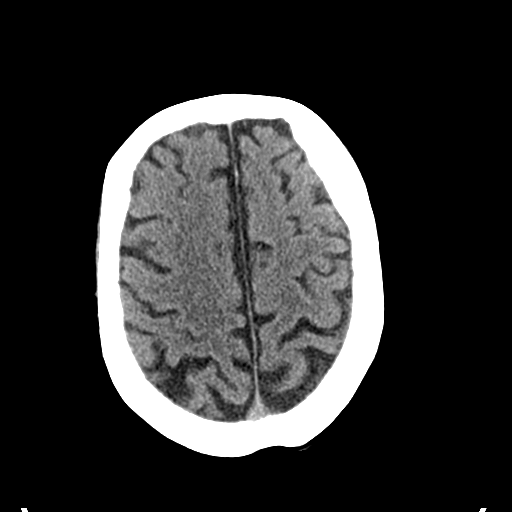
[im 31/38  brain]
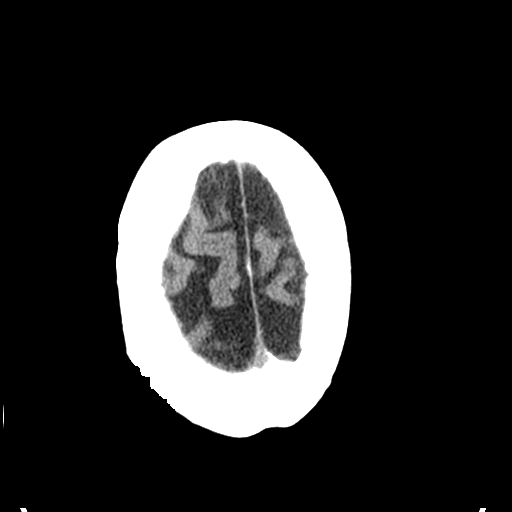
[im 35/38  brain]
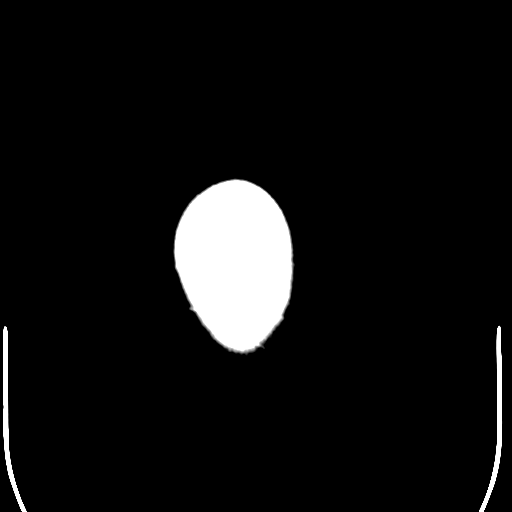
[im 35/38  bone]
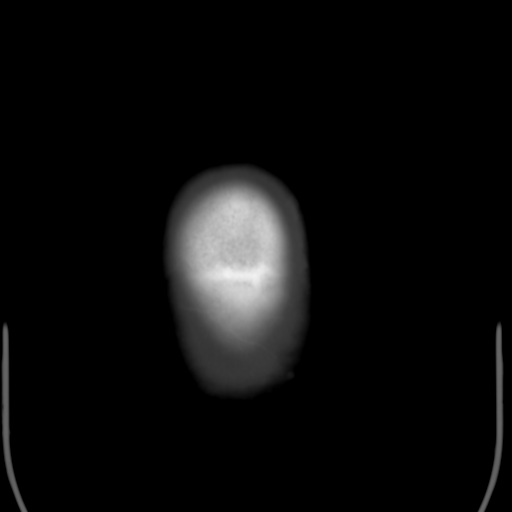

[Series 4: coronal soft tissue · coronal · 0.35mm/px · 3 of 69 slices shown]
[im 23/69  brain]
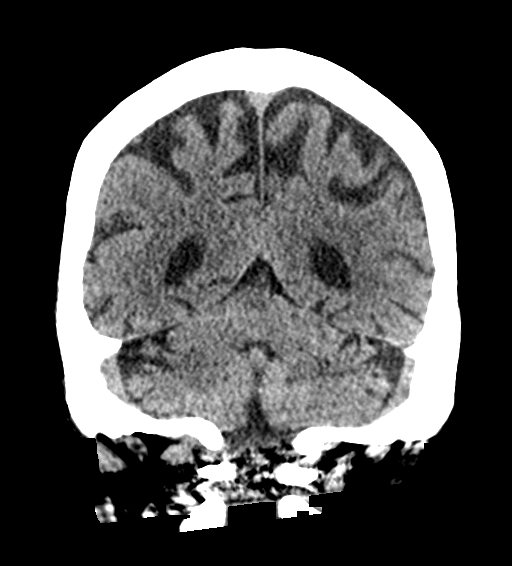
[im 31/69  brain]
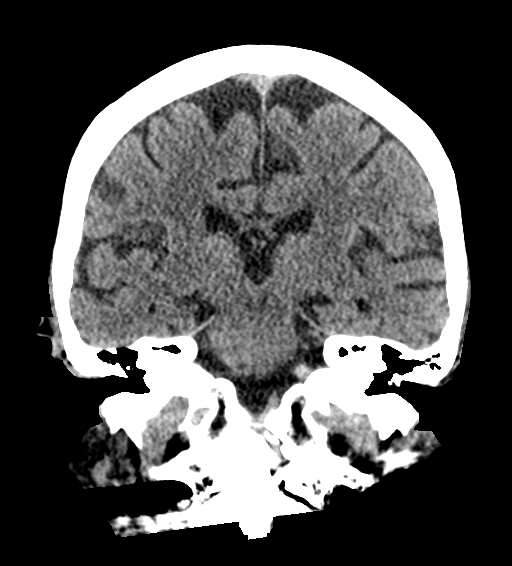
[im 38/69  brain]
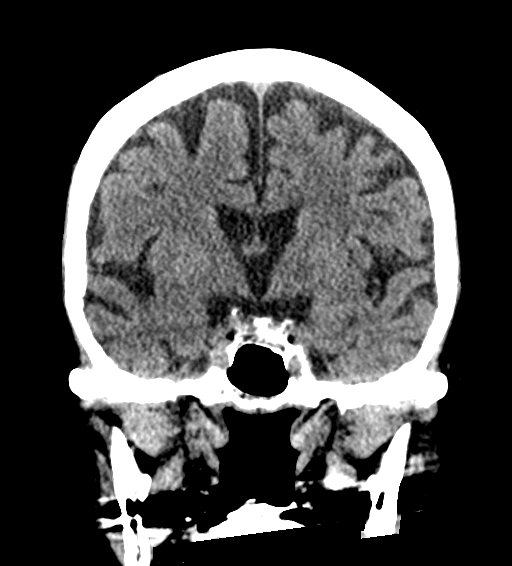

[Series 5: sagittal soft tissue · sagittal · 0.31mm/px · 3 of 59 slices shown]
[im 20/59  brain]
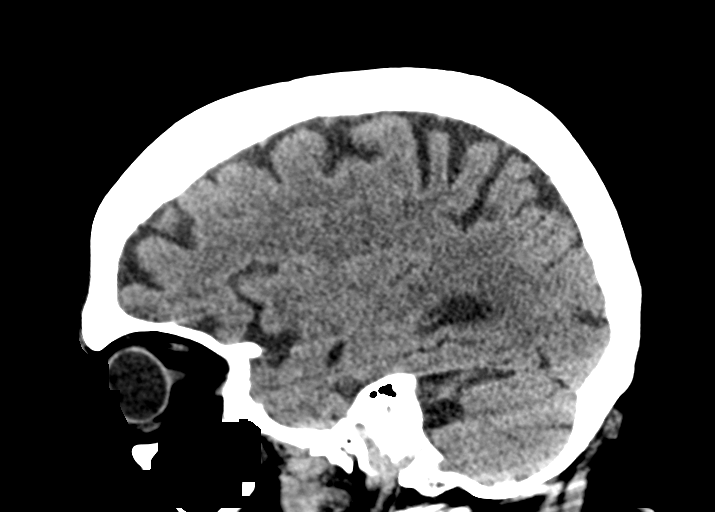
[im 30/59  brain]
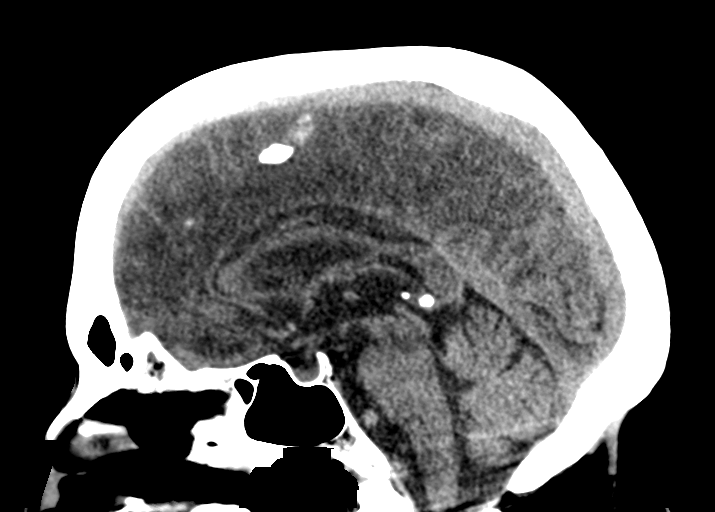
[im 39/59  brain]
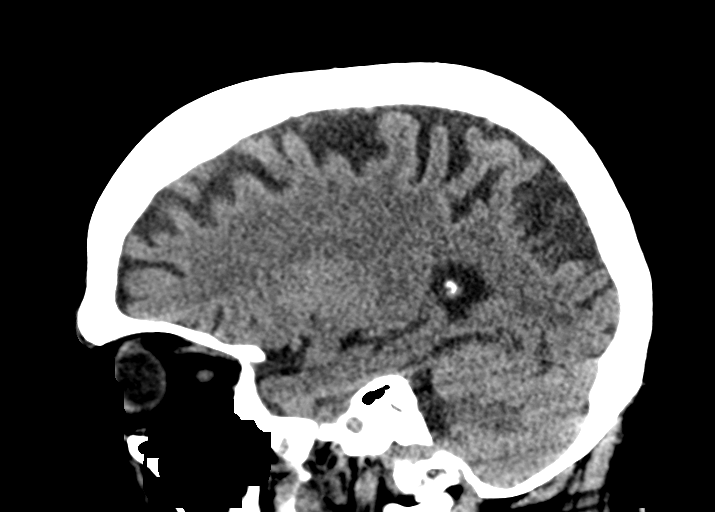

[15 of 47 positions shown; findings below may reference images not displayed]

FINDINGS: CT HEAD FINDINGS

Brain: No evidence of acute infarction, hemorrhage, hydrocephalus,
extra-axial collection or mass lesion/mass effect. Small remote
cortically based infarct at the right occipital lobe, in retrospect
stable.

Vascular: No hyperdense vessel or unexpected calcification.

Skull: Normal. Negative for fracture or focal lesion.

Sinuses/Orbits: No evidence of injury

CT CERVICAL SPINE FINDINGS

Alignment: No traumatic malalignment

Skull base and vertebrae: No acute fracture. No primary bone lesion
or focal pathologic process.

Soft tissues and spinal canal: No prevertebral fluid or swelling. No
visible canal hematoma.

Disc levels:  Ordinary degenerative changes.

Upper chest: No evidence of injury
IMPRESSION: No evidence of acute intracranial or cervical spine injury.

## 2023-02-23 ENCOUNTER — Non-Acute Institutional Stay (SKILLED_NURSING_FACILITY): Payer: Medicare PPO | Admitting: Student

## 2023-02-23 ENCOUNTER — Encounter: Payer: Self-pay | Admitting: Student

## 2023-02-23 DIAGNOSIS — F03B3 Unspecified dementia, moderate, with mood disturbance: Secondary | ICD-10-CM | POA: Diagnosis not present

## 2023-02-23 DIAGNOSIS — Z66 Do not resuscitate: Secondary | ICD-10-CM

## 2023-02-23 DIAGNOSIS — F331 Major depressive disorder, recurrent, moderate: Secondary | ICD-10-CM

## 2023-02-23 DIAGNOSIS — R296 Repeated falls: Secondary | ICD-10-CM

## 2023-02-23 DIAGNOSIS — I7121 Aneurysm of the ascending aorta, without rupture: Secondary | ICD-10-CM

## 2023-02-23 DIAGNOSIS — E440 Moderate protein-calorie malnutrition: Secondary | ICD-10-CM

## 2023-02-23 DIAGNOSIS — M353 Polymyalgia rheumatica: Secondary | ICD-10-CM

## 2023-02-23 NOTE — Progress Notes (Signed)
Location:  Other Nursing Home Room Number: 304 A Place of Service:  SNF (31) Provider:  Earnestine Mealing, MD  Patient Care Team: Earnestine Mealing, MD as PCP - General (Family Medicine)  Extended Emergency Contact Information Primary Emergency Contact: [name] [place] of [place] Home Phone: [phone] Relation: Daughter Secondary Emergency Contact: Aurelio Jew Home Phone: [phone] Mobile Phone: [phone] Relation: Son  Code Status:  DNR Goals of care: Advanced Directive information    02/23/2023   10:31 AM  Advanced Directives  Does Patient Have a Medical Advance Directive? Yes  Type of Advance Directive Out of facility DNR (pink MOST or yellow form)  Does patient want to make changes to medical advance directive? No - Patient declined  Pre-existing out of facility DNR order (yellow form or pink MOST form) Yellow form placed in chart (order not valid for inpatient use)     Chief Complaint  Patient presents with   Medical Management of Chronic Issues    Routine visit. Discuss need for covid boosters.     HPI:  Pt is a 89 y.o. female seen today for medical management of chronic diseases.  Patient states she has had better days.  Nothing in particular is making her day bed except for the fact that she is in the nursing home.  She states she would like to go play bingo.  Denies any other concerns at this time.  Denies pain, constipation, dysuria.  Nursing without acute concerns at this time.  Blood pressures have been stable he below 135/80 on current regimen.  Of note patient was admitted 4 months ago for hypertensive urgency in the setting of aortic aneurysm.   Past Medical History:  Diagnosis Date   Actinic keratosis    Arthritis    Basal cell carcinoma    Left chin, right nasolabial   Collagen vascular disease (HCC)    RA   Heartburn    Hypertension    Squamous cell carcinoma of skin 05/06/2021   left hand, treated with [facility]   Past  Surgical History:  Procedure Laterality Date   ABDOMINAL HYSTERECTOMY     BREAST CYST EXCISION     HIP ARTHROPLASTY Right 11/30/2019   Procedure: ARTHROPLASTY BIPOLAR HIP (HEMIARTHROPLASTY);  Surgeon: Christena Flake, MD;  Location: [facility] ORS;  Service: Orthopedics;  Laterality: Right;   SACROPLASTY N/A 12/17/2020   Procedure: SACROPLASTY;  Surgeon: Kennedy Bucker, MD;  Location: [facility] ORS;  Service: Orthopedics;  Laterality: N/A;   THORACIC AORTIC ENDOVASCULAR STENT GRAFT N/A 04/11/2019   Procedure: THORACIC AORTIC ENDOVASCULAR STENT GRAFT insertion;  Surgeon: Nada Libman, MD;  Location: [facility] OR;  Service: Vascular;  Laterality: N/A;   ULTRASOUND GUIDANCE FOR VASCULAR ACCESS Right 04/11/2019   Procedure: Ultrasound Guidance For Vascular Access, right femoral artery;  Surgeon: Nada Libman, MD;  Location[facility] OR;  Service: Vascular;  Laterality: Right;    Allergies  Allergen Reactions   Amoxicillin Other (See Comments)    Intolerant  Daughter and Son report patient tolerated course of Augmentin in January 2024   Codeine Nausea Only   Ibandronate Sodium  [Ibandronic Acid] Other (See Comments)    Gi upset   Risedronate Other (See Comments)    Gi upset   Sulfa Antibiotics Hives    Outpatient Encounter Medications as of 02/23/2023  Medication Sig   acetaminophen (TYLENOL) 500 MG tablet Take 1,000 mg by mouth every 8 (eight) hours as needed.   amLODipine (NORVASC) 10 MG tablet Take 1 tablet (10  mg total) by mouth daily.   atorvastatin (LIPITOR) 20 MG tablet Take 20 mg by mouth daily.   Calcium Carbonate (CALCIUM 600 PO) Take 600 mg by mouth in the morning and at bedtime.   carvedilol (COREG) 12.5 MG tablet Take 1 tablet (12.5 mg total) by mouth 2 (two) times daily with a meal.   folic acid (FOLVITE) 1 MG tablet Take 1 mg by mouth daily.   hydrALAZINE (APRESOLINE) 10 MG tablet Take 10 mg by mouth every 8 (eight) hours as needed.   levothyroxine (SYNTHROID, LEVOTHROID) 75 MCG tablet Take 75  mcg by mouth at bedtime.   losartan (COZAAR) 100 MG tablet Take 1 tablet (100 mg total) by mouth daily.   methotrexate (RHEUMATREX) 2.5 MG tablet Take 15 mg by mouth every Friday.   Multiple Vitamins-Minerals (MULTIVITAMINS THER. W/MINERALS) TABS tablet Take 1 tablet by mouth daily.   predniSONE (DELTASONE) 2.5 MG tablet Take 2.5 mg by mouth daily.   Propylene Glycol (SYSTANE BALANCE OP) PRN order: Instill one drop in both eyes every 12 hours as needed for dry eyes.   senna (SENOKOT) 8.6 MG TABS tablet Take 1 tablet by mouth daily.   sertraline (ZOLOFT) 100 MG tablet Take 100 mg by mouth daily.   traMADol (ULTRAM) 50 MG tablet Take 1 tablet (50 mg total) by mouth every 6 (six) hours as needed.   triamcinolone cream (KENALOG) 0.1 % Apply 1 Application topically as directed. Apply to trunk, arms and legs 3 days a week Monday, Wednesday, Friday, for atopic dermatitis and stasis dermatitis, avoid face, groin, axilla   trimethoprim (TRIMPEX) 100 MG tablet Take 100 mg by mouth daily.   White Petrolatum (VASELINE EX) Apply to scalp topically every evening shift for laceration   sertraline (ZOLOFT) 50 MG tablet Take 100 mg by mouth at bedtime. (Patient not taking: Reported on 02/23/2023)   No facility-administered encounter medications on file as of 02/23/2023.    Review of Systems  Immunization History  Administered Date(s) Administered   Influenza Split 01/21/2014, 12/04/2015   Influenza, High Dose Seasonal PF 01/31/2017, 12/18/2018   Influenza-Unspecified 01/27/2015, 12/18/2018, 01/01/2020, 01/05/2021, 01/19/2022, 01/26/2023   Moderna Covid-19 Vaccine Bivalent Booster 43yrs & up 09/01/2021   Moderna Sars-Covid-2 Vaccination 04/23/2019, 05/21/2019, 02/19/2020, 07/11/2020, 01/29/2021   Pneumococcal Conjugate-13 03/15/2014   Pneumococcal Polysaccharide-23 04/05/2001, 08/13/2016   RSV,unspecified 01/19/2022   Td 07/04/2005   Td (Adult),5 Lf Tetanus Toxid, Preservative Free 02/15/2017   Tdap  07/27/2005, 02/15/2017   Zoster Recombinant(Shingrix) 12/29/2018, 06/10/2019   Zoster, Live 04/05/2006   Pertinent  Health Maintenance Due  Topic Date Due   INFLUENZA VACCINE  Completed   DEXA SCAN  Discontinued      12/17/2020    2:42 PM 12/18/2020    2:00 AM 12/18/2020    1:00 PM 03/28/2022    6:10 AM 01/04/2023   12:42 PM  Fall Risk  Falls in the past year?     1  Was there an injury with Fall?     1  Fall Risk Category Calculator     3  (RETIRED) Patient Fall Risk Level High fall risk High fall risk High fall risk High fall risk    Functional Status Survey:    Vitals:   02/23/23 1030  BP: 133/60  Pulse: 62  Temp: 98.1 F (36.7 C)  Weight: 138 lb 9.6 oz (62.9 kg)  Height: 5\' 5"  (1.651 m)   Body mass index is 23.06 kg/m. Physical Exam Constitutional:  Appearance: Normal appearance.  Cardiovascular:     Rate and Rhythm: Normal rate and regular rhythm.     Pulses: Normal pulses.     Heart sounds: Normal heart sounds.  Pulmonary:     Effort: Pulmonary effort is normal.  Abdominal:     General: Abdomen is flat. Bowel sounds are normal.     Palpations: Abdomen is soft.  Musculoskeletal:        General: No swelling or tenderness.  Skin:    General: Skin is warm and dry.  Neurological:     Mental Status: She is alert and oriented to person, place, and time.     Labs reviewed: Recent Labs    10/12/22 0127 10/13/22 0132 10/14/22 0157 10/15/22 0112 10/28/22 0000 11/04/22 0000 11/18/22 0000 12/09/22 0000 12/15/22 1907  NA 133* 131* 132* 134* 137  --  143  --  137  K 3.9 3.2* 3.9 3.9 3.3*   < > 3.3* 3.3* 3.8  CL 104 104 105 106 103  --  107  --  105  CO2 20* 20* 20* 21* 27*  --  29*  --  22  GLUCOSE 104* 106* 108* 112*  --   --   --   --  110*  BUN 18 20 20 17 12   --  10  --  18  CREATININE 0.86 0.74 0.68 0.66 0.6  --  0.6  --  0.56  CALCIUM 8.2* 8.1* 8.3* 8.2* 8.2*  --  8.6*  --  8.6*  MG 1.9 2.6* 2.3  --   --   --   --   --   --   PHOS 3.3  --    --   --   --   --   --   --   --    < > = values in this interval not displayed.   Recent Labs    06/01/22 1238 06/03/22 1117 06/04/22 0416 08/10/22 0000 10/28/22 0000 11/18/22 0000  AST 25 22 17 17 16 16   ALT 16 13 10 9 19 11   ALKPHOS 60 51 43 77 58 76  BILITOT 1.3* 1.5* 1.3*  --   --   --   PROT 7.0 6.1* 5.2*  --   --   --   ALBUMIN 3.5 2.9* 2.4* 3.2* 2.7* 3.4*   Recent Labs    06/03/22 1117 06/03/22 1819 06/14/22 0000 08/10/22 0000 10/12/22 0127 10/15/22 0112 10/28/22 0000 12/15/22 1907  WBC 18.2*   < > 14.2   < > 13.9* 14.3* 7.4 9.6  NEUTROABS 15.1*  --  9,670.00  --   --   --  4,706.00  --   HGB 11.4*   < > 9.8*   < > 10.4* 10.2* 9.2* 10.0*  HCT 36.0   < > 30*   < > 32.6* 31.3* 29* 31.7*  MCV 100.6*   < >  --    < > 92.1 87.2  --  92.4  PLT 205   < > 308   < > 160 191 306 205   < > = values in this interval not displayed.   Lab Results  Component Value Date   TSH 6.30 (A) 01/06/2023   Lab Results  Component Value Date   HGBA1C 5.6 08/26/2022   Lab Results  Component Value Date   CHOL 150 12/09/2022   HDL 75 (A) 12/09/2022   LDLCALC 58 12/09/2022   TRIG 90 12/09/2022   CHOLHDL 2.5  04/07/2019    Significant Diagnostic Results in last 30 days:  No results found.  Assessment/Plan Recurrent falls  DNR (do not resuscitate) - Plan: Do not attempt resuscitation (DNR)  Moderate dementia with mood disturbance, unspecified dementia type (HCC)  Malnutrition of moderate degree (HCC)  Moderate episode of recurrent major depressive disorder (HCC)  Aneurysm of ascending aorta without rupture (HCC)  Polymyalgia rheumatica (HCC) Patient seen today for routine evaluation.  She was admitted to the hospital 4 months ago for aortic aneurysm and hypertensive urgency.  Blood pressure well-controlled at this time.  As needed hydralazine use regularly for blood pressure greater than 140/90.  Patient is tolerating a regular diet.  Weight is stable at this time.   Continue protein supplementation as tolerated.  Receives injections for PMR which has pain well-controlled.  Per previous discussions with family patient has not hospice candidate due to desired treatment of PMR with injections.  Continue supportive care and goals of care conversations.  Patient with rest current falls, encourage assistance from nursing and assistive devices for ambulation.    Family/ staff Communication: nursing  Labs/tests ordered:  none

## 2023-02-27 ENCOUNTER — Encounter: Payer: Self-pay | Admitting: Student

## 2023-03-25 ENCOUNTER — Non-Acute Institutional Stay (SKILLED_NURSING_FACILITY): Payer: Self-pay | Admitting: Student

## 2023-03-25 ENCOUNTER — Encounter: Payer: Self-pay | Admitting: Student

## 2023-03-25 DIAGNOSIS — S51012A Laceration without foreign body of left elbow, initial encounter: Secondary | ICD-10-CM

## 2023-03-25 DIAGNOSIS — I7121 Aneurysm of the ascending aorta, without rupture: Secondary | ICD-10-CM | POA: Diagnosis not present

## 2023-03-25 DIAGNOSIS — F03B3 Unspecified dementia, moderate, with mood disturbance: Secondary | ICD-10-CM | POA: Diagnosis not present

## 2023-03-25 DIAGNOSIS — R296 Repeated falls: Secondary | ICD-10-CM

## 2023-03-25 DIAGNOSIS — S0003XA Contusion of scalp, initial encounter: Secondary | ICD-10-CM

## 2023-03-25 NOTE — Progress Notes (Unsigned)
[place]  [facility]  [facility] Room Number: [facility] [address]A Place of Service:  SNF [phone]) Provider:  Earnestine Mealing, MD  Patient Care Team: Earnestine Mealing, MD as PCP - General (Family Medicine)  Extended Emergency Contact Information Primary Emergency Contact: Verna Czech of [place] Home Phone: [phone] Relation: Daughter Secondary Emergency Contact: Aurelio Jew Home Phone: [phone] Mobile Phone: [phone] Relation: Son  Code Status:  DNR Goals of care: Advanced Directive information    03/25/2023   11:14 AM  Advanced Directives  Does Patient Have a Medical Advance Directive? Yes  Type of Advance Directive Out of facility DNR (pink MOST or yellow form)  Does patient want to make changes to medical advance directive? No - Patient declined     Chief Complaint  Patient presents with   Acute Visit    Fall    HPI:  Pt is a 88 y.o. female seen today for an acute visit for Fall.  Patient fell this morning while trying to take herself to the bathroom without assistance.  She states she knows she is asked for help, she just does not always think to will want to.  She has some pain on her forehead.  She wishes that she could go back and undo trying to do things but herself.  She plans to ask for help in the future.  Nursing with concern that patient had a significant fall and is in pain at this time.  Ice packs provided.  Nursing spoke with family members who state no indication to transfer to ED at this time and prefer supportive care in the facility.   Past Medical History:  Diagnosis Date   Actinic keratosis    Arthritis    Basal cell carcinoma    Left chin, right nasolabial   Collagen vascular disease (HCC)    RA   Heartburn    Hypertension    Squamous cell carcinoma of skin 05/06/2021   left hand, treated with [facility]   Past Surgical History:  Procedure Laterality Date   ABDOMINAL HYSTERECTOMY     BREAST CYST EXCISION      HIP ARTHROPLASTY Right 11/30/2019   Procedure: ARTHROPLASTY BIPOLAR HIP (HEMIARTHROPLASTY);  Surgeon: Christena Flake, MD;  Location: [facility] ORS;  Service: Orthopedics;  Laterality: Right;   SACROPLASTY N/A 12/17/2020   Procedure: SACROPLASTY;  Surgeon: Kennedy Bucker, MD;  Location: [facility] ORS;  Service: Orthopedics;  Laterality: N/A;   THORACIC AORTIC ENDOVASCULAR STENT GRAFT N/A 04/11/2019   Procedure: THORACIC AORTIC ENDOVASCULAR STENT GRAFT insertion;  Surgeon: Nada Libman, MD;  Location: [facility] OR;  Service: Vascular;  Laterality: N/A;   ULTRASOUND GUIDANCE FOR VASCULAR ACCESS Right 04/11/2019   Procedure: Ultrasound Guidance For Vascular Access, right femoral artery;  Surgeon: Nada Libman, MD;  Location: [facility] OR;  Service: Vascular;  Laterality: Right;    Allergies  Allergen Reactions   Amoxicillin Other (See Comments)    Intolerant  Daughter and Son report patient tolerated course of Augmentin in January 2024   Codeine Nausea Only   Ibandronate Sodium  [Ibandronic Acid] Other (See Comments)    Gi upset   Risedronate Other (See Comments)    Gi upset   Sulfa Antibiotics Hives    Outpatient Encounter Medications as of 03/25/2023  Medication Sig   acetaminophen (TYLENOL) 325 MG tablet Take 650 mg by mouth 2 (two) times daily.   acetaminophen (TYLENOL) 500 MG tablet Take 1,000 mg by mouth. Every 12 hours as needed.  amLODipine (NORVASC) 10 MG tablet Take 1 tablet (10 mg total) by mouth daily.   atorvastatin (LIPITOR) 20 MG tablet Take 20 mg by mouth daily.   Calcium Carbonate (CALCIUM 600 PO) Take 600 mg by mouth in the morning and at bedtime.   carvedilol (COREG) 12.5 MG tablet Take 1 tablet (12.5 mg total) by mouth 2 (two) times daily with a meal.   folic acid (FOLVITE) 1 MG tablet Take 1 mg by mouth daily.   hydrALAZINE (APRESOLINE) 10 MG tablet Take 10 mg by mouth every 8 (eight) hours as needed.   levothyroxine (SYNTHROID, LEVOTHROID) 75 MCG tablet Take 75 mcg by mouth at  bedtime.   losartan (COZAAR) 100 MG tablet Take 1 tablet (100 mg total) by mouth daily.   methotrexate (RHEUMATREX) 2.5 MG tablet Take 15 mg by mouth every Friday.   Multiple Vitamins-Minerals (MULTIVITAMINS THER. W/MINERALS) TABS tablet Take 1 tablet by mouth daily.   predniSONE (DELTASONE) 2.5 MG tablet Take 2.5 mg by mouth daily.   Propylene Glycol (SYSTANE BALANCE OP) PRN order: Instill one drop in both eyes every 12 hours as needed for dry eyes.   senna (SENOKOT) 8.6 MG TABS tablet Take 1 tablet by mouth daily.   sertraline (ZOLOFT) 100 MG tablet Take 100 mg by mouth daily.   trimethoprim (TRIMPEX) 100 MG tablet Take 100 mg by mouth daily.   [DISCONTINUED] sertraline (ZOLOFT) 50 MG tablet Take 100 mg by mouth at bedtime. (Patient not taking: Reported on 02/23/2023)   [DISCONTINUED] traMADol (ULTRAM) 50 MG tablet Take 1 tablet (50 mg total) by mouth every 6 (six) hours as needed. (Patient not taking: Reported on 03/25/2023)   [DISCONTINUED] triamcinolone cream (KENALOG) 0.1 % Apply 1 Application topically as directed. Apply to trunk, arms and legs 3 days a week Monday, Wednesday, Friday, for atopic dermatitis and stasis dermatitis, avoid face, groin, axilla (Patient not taking: Reported on 03/25/2023)   [DISCONTINUED] White Petrolatum (VASELINE EX) Apply to scalp topically every evening shift for laceration (Patient not taking: Reported on 03/25/2023)   No facility-administered encounter medications on file as of 03/25/2023.    Review of Systems  Immunization History  Administered Date(s) Administered   Influenza Split 01/21/2014, 12/04/2015   Influenza, High Dose Seasonal PF 01/31/2017, 12/18/2018   Influenza-Unspecified 01/27/2015, 12/18/2018, 01/01/2020, 01/05/2021, 01/19/2022, 01/26/2023   Moderna Covid-19 Vaccine Bivalent Booster 60yrs & up 09/01/2021   Moderna Sars-Covid-2 Vaccination 04/23/2019, 05/21/2019, 02/19/2020, 07/11/2020, 01/29/2021   Pneumococcal Conjugate-13 03/15/2014    Pneumococcal Polysaccharide-23 04/05/2001, 08/13/2016   RSV,unspecified 01/19/2022   Td 07/04/2005   Td (Adult),5 Lf Tetanus Toxid, Preservative Free 02/15/2017   Tdap 07/27/2005, 02/15/2017   Zoster Recombinant(Shingrix) 12/29/2018, 06/10/2019   Zoster, Live 04/05/2006   Pertinent  Health Maintenance Due  Topic Date Due   INFLUENZA VACCINE  Completed   DEXA SCAN  Discontinued      12/17/2020    2:42 PM 12/18/2020    2:00 AM 12/18/2020    1:00 PM 03/28/2022    6:10 AM 01/04/2023   12:42 PM  Fall Risk  Falls in the past year?     1  Was there an injury with Fall?     1  Fall Risk Category Calculator     3  (RETIRED) Patient Fall Risk Level High fall risk High fall risk High fall risk High fall risk    Functional Status Survey:    Vitals:   03/25/23 1106  BP: 133/79  Pulse: 66  Resp: 18  Temp: 98.3 F (36.8 C)  SpO2: 98%  Weight: 141 lb 9.6 oz (64.2 kg)  Height: 5\' 5"  (1.651 m)   Body mass index is 23.56 kg/m. Physical Exam Eyes:     Pupils: Pupils are equal, round, and reactive to light.  Skin:    Comments: Large ecchymosis of left forehead, left elbow skintear  Neurological:     Mental Status: She is alert and oriented to person, place, and time.     Labs reviewed: Recent Labs    10/12/22 0127 10/13/22 0132 10/14/22 0157 10/15/22 0112 10/28/22 0000 11/04/22 0000 11/18/22 0000 12/09/22 0000 12/15/22 1907  NA 133* 131* 132* 134* 137  --  143  --  137  K 3.9 3.2* 3.9 3.9 3.3*   < > 3.3* 3.3* 3.8  CL 104 104 105 106 103  --  107  --  105  CO2 20* 20* 20* 21* 27*  --  29*  --  22  GLUCOSE 104* 106* 108* 112*  --   --   --   --  110*  BUN 18 20 20 17 12   --  10  --  18  CREATININE 0.86 0.74 0.68 0.66 0.6  --  0.6  --  0.56  CALCIUM 8.2* 8.1* 8.3* 8.2* 8.2*  --  8.6*  --  8.6*  MG 1.9 2.6* 2.3  --   --   --   --   --   --   PHOS 3.3  --   --   --   --   --   --   --   --    < > = values in this interval not displayed.   Recent Labs     06/01/22 1238 06/03/22 1117 06/04/22 0416 08/10/22 0000 10/28/22 0000 11/18/22 0000  AST 25 22 17 17 16 16   ALT 16 13 10 9 19 11   ALKPHOS 60 51 43 77 58 76  BILITOT 1.3* 1.5* 1.3*  --   --   --   PROT 7.0 6.1* 5.2*  --   --   --   ALBUMIN 3.5 2.9* 2.4* 3.2* 2.7* 3.4*   Recent Labs    06/03/22 1117 06/03/22 1819 06/14/22 0000 08/10/22 0000 10/12/22 0127 10/15/22 0112 10/28/22 0000 12/15/22 1907  WBC 18.2*   < > 14.2   < > 13.9* 14.3* 7.4 9.6  NEUTROABS 15.1*  --  9,670.00  --   --   --  4,706.00  --   HGB 11.4*   < > 9.8*   < > 10.4* 10.2* 9.2* 10.0*  HCT 36.0   < > 30*   < > 32.6* 31.3* 29* 31.7*  MCV 100.6*   < >  --    < > 92.1 87.2  --  92.4  PLT 205   < > 308   < > 160 191 306 205   < > = values in this interval not displayed.   Lab Results  Component Value Date   TSH 6.30 (A) 01/06/2023   Lab Results  Component Value Date   HGBA1C 5.6 08/26/2022   Lab Results  Component Value Date   CHOL 150 12/09/2022   HDL 75 (A) 12/09/2022   LDLCALC 58 12/09/2022   TRIG 90 12/09/2022   CHOLHDL 2.5 04/07/2019    Significant Diagnostic Results in last 30 days:  No results found.  Assessment/Plan Recurrent falls  Moderate dementia with mood disturbance, unspecified dementia type (HCC)  Aneurysm  of ascending aorta without rupture (HCC)  Skin tear of left elbow without complication, initial encounter  Contusion of left temporofrontal scalp, initial encounter Patient with history of dementia and numerous falls.  Suffered fall this morning with large contusion of the left forehead and skin tear of the elbow.  Patient is alert and oriented x 3.  Pupils reactive.  Tylenol as needed for pain.  Continue supportive care and encourage use of call bell for assistance. Family/ staff Communication: nursing  Labs/tests ordered:  none

## 2023-03-26 ENCOUNTER — Encounter: Payer: Self-pay | Admitting: Student

## 2023-04-04 ENCOUNTER — Non-Acute Institutional Stay (SKILLED_NURSING_FACILITY): Payer: Self-pay | Admitting: Adult Health

## 2023-04-04 ENCOUNTER — Encounter: Payer: Self-pay | Admitting: Adult Health

## 2023-04-04 DIAGNOSIS — M545 Low back pain, unspecified: Secondary | ICD-10-CM | POA: Diagnosis not present

## 2023-04-04 DIAGNOSIS — R1310 Dysphagia, unspecified: Secondary | ICD-10-CM

## 2023-04-04 DIAGNOSIS — H6123 Impacted cerumen, bilateral: Secondary | ICD-10-CM | POA: Diagnosis not present

## 2023-04-04 NOTE — Progress Notes (Unsigned)
Location:  [facility]) Nursing Home Room Number: [facility] 304-A [facility]) Place of Service:  SNF (31) Provider:  Kenard Gower, DNP, FNP-BC  Patient Care Team: Earnestine Mealing, MD as PCP - General (Family Medicine)  Extended Emergency Contact Information Primary Emergency Contact: Verna Czech of [place] Home Phone: [phone] Relation: Daughter Secondary Emergency Contact: Aurelio Jew Home Phone: [phone] Mobile Phone: [phone] Relation: Son  Code Status:  DNR  Goals of care: Advanced Directive information    04/04/2023    4:23 PM  Advanced Directives  Does Patient Have a Medical Advance Directive? Yes  Type of Advance Directive Out of facility DNR (pink MOST or yellow form)  Does patient want to make changes to medical advance directive? No - Patient declined  Pre-existing out of facility DNR order (yellow form or pink MOST form) Yellow form placed in chart (order not valid for inpatient use)     Chief Complaint  Patient presents with   Acute Visit    choking episode, lower back pain and bilateral ears cerumen    HPI:  Pt is a 89 y.o. female seen today for medical management of chronic diseases.  ***   Past Medical History:  Diagnosis Date   Actinic keratosis    Arthritis    Basal cell carcinoma    Left chin, right nasolabial   Collagen vascular disease (HCC)    RA   Heartburn    Hypertension    Squamous cell carcinoma of skin 05/06/2021   left hand, treated with [facility]   Past Surgical History:  Procedure Laterality Date   ABDOMINAL HYSTERECTOMY     BREAST CYST EXCISION     HIP ARTHROPLASTY Right 11/30/2019   Procedure: ARTHROPLASTY BIPOLAR HIP (HEMIARTHROPLASTY);  Surgeon: Christena Flake, MD;  Location: [facility] ORS;  Service: Orthopedics;  Laterality: Right;   SACROPLASTY N/A 12/17/2020   Procedure: SACROPLASTY;  Surgeon: Kennedy Bucker, MD;  Location: [facility] ORS;  Service: Orthopedics;  Laterality: N/A;    THORACIC AORTIC ENDOVASCULAR STENT GRAFT N/A 04/11/2019   Procedure: THORACIC AORTIC ENDOVASCULAR STENT GRAFT insertion;  Surgeon: Nada Libman, MD;  Location: [facility] OR;  Service: Vascular;  Laterality: N/A;   ULTRASOUND GUIDANCE FOR VASCULAR ACCESS Right 04/11/2019   Procedure: Ultrasound Guidance For Vascular Access, right femoral artery;  Surgeon: Nada Libman, MD;  Location: [facility] OR;  Service: Vascular;  Laterality: Right;    Allergies  Allergen Reactions   Amoxicillin Other (See Comments)    Intolerant  Daughter and Son report patient tolerated course of Augmentin in January 2024   Codeine Nausea Only   Ibandronate Sodium  [Ibandronic Acid] Other (See Comments)    Gi upset   Risedronate Other (See Comments)    Gi upset   Sulfa Antibiotics Hives    Outpatient Encounter Medications as of 04/04/2023  Medication Sig   acetaminophen (TYLENOL) 325 MG tablet Take 650 mg by mouth 2 (two) times daily.   acetaminophen (TYLENOL) 500 MG tablet Take 1,000 mg by mouth. Every 12 hours as needed.   amLODipine (NORVASC) 10 MG tablet Take 1 tablet (10 mg total) by mouth daily.   atorvastatin (LIPITOR) 20 MG tablet Take 20 mg by mouth daily.   Calcium Carbonate (CALCIUM 600 PO) Take 600 mg by mouth in the morning and at bedtime.   carvedilol (COREG) 12.5 MG tablet Take 1 tablet (12.5 mg total) by mouth 2 (two) times daily with a meal.   folic acid (FOLVITE) 1 MG  tablet Take 1 mg by mouth daily.   hydrALAZINE (APRESOLINE) 10 MG tablet Take 10 mg by mouth every 8 (eight) hours as needed.   levothyroxine (SYNTHROID, LEVOTHROID) 75 MCG tablet Take 75 mcg by mouth at bedtime.   lidocaine (SALONPAS PAIN RELIEVING) 4 % Place 1 patch onto the skin daily.   losartan (COZAAR) 100 MG tablet Take 1 tablet (100 mg total) by mouth daily.   methotrexate (RHEUMATREX) 2.5 MG tablet Take 15 mg by mouth every Friday.   Multiple Vitamins-Minerals (MULTIVITAMINS THER. W/MINERALS) TABS tablet Take 1 tablet by mouth  daily.   predniSONE (DELTASONE) 2.5 MG tablet Take 2.5 mg by mouth daily.   Propylene Glycol (SYSTANE BALANCE OP) PRN order: Instill one drop in both eyes every 12 hours as needed for dry eyes.   senna (SENOKOT) 8.6 MG TABS tablet Take 1 tablet by mouth daily.   sertraline (ZOLOFT) 100 MG tablet Take 100 mg by mouth daily.   trimethoprim (TRIMPEX) 100 MG tablet Take 100 mg by mouth daily.   No facility-administered encounter medications on file as of 04/04/2023.    Review of Systems ***    Immunization History  Administered Date(s) Administered   Influenza Split 01/21/2014, 12/04/2015   Influenza, High Dose Seasonal PF 01/31/2017, 12/18/2018   Influenza-Unspecified 01/27/2015, 12/18/2018, 01/01/2020, 01/05/2021, 01/19/2022, 01/26/2023   Moderna Covid-19 Vaccine Bivalent Booster 14yrs & up 09/01/2021   Moderna Sars-Covid-2 Vaccination 04/23/2019, 05/21/2019, 02/19/2020, 07/11/2020, 01/29/2021   Pneumococcal Conjugate-13 03/15/2014   Pneumococcal Polysaccharide-23 04/05/2001, 08/13/2016   RSV,unspecified 01/19/2022   Td 07/04/2005   Td (Adult),5 Lf Tetanus Toxid, Preservative Free 02/15/2017   Tdap 07/27/2005, 02/15/2017   Zoster Recombinant(Shingrix) 12/29/2018, 06/10/2019   Zoster, Live 04/05/2006   Pertinent  Health Maintenance Due  Topic Date Due   INFLUENZA VACCINE  Completed   DEXA SCAN  Discontinued      12/17/2020    2:42 PM 12/18/2020    2:00 AM 12/18/2020    1:00 PM 03/28/2022    6:10 AM 01/04/2023   12:42 PM  Fall Risk  Falls in the past year?     1  Was there an injury with Fall?     1  Fall Risk Category Calculator     3  (RETIRED) Patient Fall Risk Level High fall risk High fall risk High fall risk High fall risk      Vitals:   04/04/23 1617  BP: (!) 117/59  Pulse: 60  Resp: 18  Temp: (!) 97.3 F (36.3 C)  SpO2: 98%  Weight: 141 lb 9.6 oz (64.2 kg)  Height: 5\' 6"  (1.676 m)   Body mass index is 22.85 kg/m.  Physical Exam     Labs  reviewed: Recent Labs    10/12/22 0127 10/13/22 0132 10/14/22 0157 10/15/22 0112 10/28/22 0000 11/04/22 0000 11/18/22 0000 12/09/22 0000 12/15/22 1907  NA 133* 131* 132* 134* 137  --  143  --  137  K 3.9 3.2* 3.9 3.9 3.3*   < > 3.3* 3.3* 3.8  CL 104 104 105 106 103  --  107  --  105  CO2 20* 20* 20* 21* 27*  --  29*  --  22  GLUCOSE 104* 106* 108* 112*  --   --   --   --  110*  BUN 18 20 20 17 12   --  10  --  18  CREATININE 0.86 0.74 0.68 0.66 0.6  --  0.6  --  0.56  CALCIUM  8.2* 8.1* 8.3* 8.2* 8.2*  --  8.6*  --  8.6*  MG 1.9 2.6* 2.3  --   --   --   --   --   --   PHOS 3.3  --   --   --   --   --   --   --   --    < > = values in this interval not displayed.   Recent Labs    06/01/22 1238 06/03/22 1117 06/04/22 0416 08/10/22 0000 10/28/22 0000 11/18/22 0000  AST 25 22 17 17 16 16   ALT 16 13 10 9 19 11   ALKPHOS 60 51 43 77 58 76  BILITOT 1.3* 1.5* 1.3*  --   --   --   PROT 7.0 6.1* 5.2*  --   --   --   ALBUMIN 3.5 2.9* 2.4* 3.2* 2.7* 3.4*   Recent Labs    06/03/22 1117 06/03/22 1819 06/14/22 0000 08/10/22 0000 10/12/22 0127 10/15/22 0112 10/28/22 0000 12/15/22 1907  WBC 18.2*   < > 14.2   < > 13.9* 14.3* 7.4 9.6  NEUTROABS 15.1*  --  9,670.00  --   --   --  4,706.00  --   HGB 11.4*   < > 9.8*   < > 10.4* 10.2* 9.2* 10.0*  HCT 36.0   < > 30*   < > 32.6* 31.3* 29* 31.7*  MCV 100.6*   < >  --    < > 92.1 87.2  --  92.4  PLT 205   < > 308   < > 160 191 306 205   < > = values in this interval not displayed.   Lab Results  Component Value Date   TSH 6.30 (A) 01/06/2023   Lab Results  Component Value Date   HGBA1C 5.6 08/26/2022   Lab Results  Component Value Date   CHOL 150 12/09/2022   HDL 75 (A) 12/09/2022   LDLCALC 58 12/09/2022   TRIG 90 12/09/2022   CHOLHDL 2.5 04/07/2019    Significant Diagnostic Results in last 30 days:  No results found.  Assessment/Plan ***   Family/ staff Communication: Discussed plan of care with resident and  charge nurse  Labs/tests ordered:     Kenard Gower, DNP, MSN, FNP-BC [facility] [phone] (Monday-Friday 8:00 a.m. - 5:00 p.m.) [phone] (after hours)

## 2023-04-18 ENCOUNTER — Non-Acute Institutional Stay (SKILLED_NURSING_FACILITY): Payer: Medicare PPO | Admitting: Student

## 2023-04-18 DIAGNOSIS — I71012 Dissection of descending thoracic aorta: Secondary | ICD-10-CM | POA: Diagnosis not present

## 2023-04-18 DIAGNOSIS — M069 Rheumatoid arthritis, unspecified: Secondary | ICD-10-CM

## 2023-04-18 DIAGNOSIS — F331 Major depressive disorder, recurrent, moderate: Secondary | ICD-10-CM

## 2023-04-18 DIAGNOSIS — F03B3 Unspecified dementia, moderate, with mood disturbance: Secondary | ICD-10-CM

## 2023-04-18 DIAGNOSIS — R296 Repeated falls: Secondary | ICD-10-CM

## 2023-04-18 DIAGNOSIS — J9601 Acute respiratory failure with hypoxia: Secondary | ICD-10-CM

## 2023-04-18 NOTE — Progress Notes (Signed)
 Location:  Other Nursing Home Room Number: [facility] Place of Service:  SNF (31) Provider:  Abdul Abdul, Richerd, MD  Patient Care Team: Abdul Richerd, MD as PCP - General [facility] Medicine)  Extended Emergency Contact Information Primary Emergency Contact: Lupton,Barbara  [place] Home Phone: [phone] Relation: Daughter Secondary Emergency Contact: Maple Bridgette Lenis Home Phone: [phone] Mobile Phone: [phone] Relation: Son  Code Status:  DNR Goals of care: Advanced Directive information    04/04/2023    4:23 PM  Advanced Directives  Does Patient Have a Medical Advance Directive? Yes  Type of Advance Directive Out of facility DNR (pink MOST or yellow form)  Does patient want to make changes to medical advance directive? No - Patient declined  Pre-existing out of facility DNR order (yellow form or pink MOST form) Yellow form placed in chart (order not valid for inpatient use)     Chief Complaint  Patient presents with   Recurrent Falls    HPI:  Pt is a 89 y.o. female seen today for medical management of chronic diseases.   She is doing okay. Doesn't remember how she fell. She denies pain at this time.   Nursing states she has a 2 cm wound of the right forearm.   She is eating well. Continues to ambulate well with walker, however has trouble getting started. Unclear exactly how she falls each nigh, however, based on wounds appears to be a similar MOI.   Past Medical History:  Diagnosis Date   Actinic keratosis    Arthritis    Basal cell carcinoma    Left chin, right nasolabial   Collagen vascular disease (HCC)    RA   Heartburn    Hypertension    Squamous cell carcinoma of skin 05/06/2021   left hand, treated with [facility]   Past Surgical History:  Procedure Laterality Date   ABDOMINAL HYSTERECTOMY     BREAST CYST EXCISION     HIP ARTHROPLASTY Right 11/30/2019   Procedure: ARTHROPLASTY BIPOLAR HIP (HEMIARTHROPLASTY);   Surgeon: Edie Norleen PARAS, MD;  Location: [facility] ORS;  Service: Orthopedics;  Laterality: Right;   SACROPLASTY N/A 12/17/2020   Procedure: SACROPLASTY;  Surgeon: Kathlynn Sharper, MD;  Location: [facility] ORS;  Service: Orthopedics;  Laterality: N/A;   THORACIC AORTIC ENDOVASCULAR STENT GRAFT N/A 04/11/2019   Procedure: THORACIC AORTIC ENDOVASCULAR STENT GRAFT insertion;  Surgeon: Serene Gaile ORN, MD;  Location: [facility] OR;  Service: Vascular;  Laterality: N/A;   ULTRASOUND GUIDANCE FOR VASCULAR ACCESS Right 04/11/2019   Procedure: Ultrasound Guidance For Vascular Access, right femoral artery;  Surgeon: Serene Gaile ORN, MD;  Location: [facility] OR;  Service: Vascular;  Laterality: Right;    Allergies  Allergen Reactions   Amoxicillin  Other (See Comments)    Intolerant  Daughter and Son report patient tolerated course of Augmentin  in January 2024   Codeine Nausea Only   Ibandronate Sodium  [Ibandronic Acid] Other (See Comments)    Gi upset   Risedronate Other (See Comments)    Gi upset   Sulfa Antibiotics Hives    Outpatient Encounter Medications as of 04/18/2023  Medication Sig   acetaminophen  (TYLENOL ) 325 MG tablet Take 650 mg by mouth 2 (two) times daily.   acetaminophen  (TYLENOL ) 500 MG tablet Take 1,000 mg by mouth. Every 12 hours as needed.   amLODipine  (NORVASC ) 10 MG tablet Take 1 tablet (10 mg total) by mouth daily.   atorvastatin  (LIPITOR) 20 MG tablet Take 20 mg by mouth daily.  Calcium  Carbonate (CALCIUM  600 PO) Take 600 mg by mouth in the morning and at bedtime.   carvedilol  (COREG ) 12.5 MG tablet Take 1 tablet (12.5 mg total) by mouth 2 (two) times daily with a meal.   folic acid  (FOLVITE ) 1 MG tablet Take 1 mg by mouth daily.   hydrALAZINE  (APRESOLINE ) 10 MG tablet Take 10 mg by mouth every 8 (eight) hours as needed.   levothyroxine  (SYNTHROID , LEVOTHROID) 75 MCG tablet Take 75 mcg by mouth at bedtime.   lidocaine  (SALONPAS PAIN RELIEVING) 4 % Place 1 patch onto the skin daily.   losartan   (COZAAR ) 100 MG tablet Take 1 tablet (100 mg total) by mouth daily.   methotrexate  (RHEUMATREX) 2.5 MG tablet Take 15 mg by mouth every Friday.   Multiple Vitamins-Minerals (MULTIVITAMINS THER. W/MINERALS) TABS tablet Take 1 tablet by mouth daily.   predniSONE  (DELTASONE ) 2.5 MG tablet Take 2.5 mg by mouth daily.   Propylene Glycol (SYSTANE BALANCE OP) PRN order: Instill one drop in both eyes every 12 hours as needed for dry eyes.   senna (SENOKOT) 8.6 MG TABS tablet Take 1 tablet by mouth daily.   sertraline  (ZOLOFT ) 100 MG tablet Take 100 mg by mouth daily.   trimethoprim (TRIMPEX) 100 MG tablet Take 100 mg by mouth daily.   No facility-administered encounter medications on file as of 04/18/2023.    Review of Systems  Immunization History  Administered Date(s) Administered   Influenza Split 01/21/2014, 12/04/2015   Influenza, High Dose Seasonal PF 01/31/2017, 12/18/2018   Influenza-Unspecified 01/27/2015, 12/18/2018, 01/01/2020, 01/05/2021, 01/19/2022, 01/26/2023   Moderna Covid-19 Vaccine Bivalent Booster 73yrs & up 09/01/2021   Moderna Sars-Covid-2 Vaccination 04/23/2019, 05/21/2019, 02/19/2020, 07/11/2020, 01/29/2021   Pneumococcal Conjugate-13 03/15/2014   Pneumococcal Polysaccharide-23 04/05/2001, 08/13/2016   RSV,unspecified 01/19/2022   Td 07/04/2005   Td (Adult),5 Lf Tetanus Toxid, Preservative Free 02/15/2017   Tdap 07/27/2005, 02/15/2017   Zoster Recombinant(Shingrix) 12/29/2018, 06/10/2019   Zoster, Live 04/05/2006   Pertinent  Health Maintenance Due  Topic Date Due   INFLUENZA VACCINE  Completed   DEXA SCAN  Discontinued      12/17/2020    2:42 PM 12/18/2020    2:00 AM 12/18/2020    1:00 PM 03/28/2022    6:10 AM 01/04/2023   12:42 PM  Fall Risk  Falls in the past year?     1  Was there an injury with Fall?     1  Fall Risk Category Calculator     3  (RETIRED) Patient Fall Risk Level High fall risk High fall risk High fall risk High fall risk    Functional  Status Survey:    Vitals:   04/18/23 0801  BP: 112/63  Pulse: 60  Resp: 16  Temp: 97.8 F (36.6 C)  SpO2: 98%  Weight: 144 lb 6.4 oz (65.5 kg)   Body mass index is 23.31 kg/m. Physical Exam Cardiovascular:     Rate and Rhythm: Normal rate.     Pulses: Normal pulses.  Pulmonary:     Effort: Pulmonary effort is normal.  Abdominal:     General: Abdomen is flat.     Palpations: Abdomen is soft.  Skin:    Comments: Right arm wound clean, dry, intact.  Neurological:     Mental Status: She is alert.     Labs reviewed: Recent Labs    10/12/22 0127 10/13/22 0132 10/14/22 0157 10/15/22 0112 10/28/22 0000 11/04/22 0000 11/18/22 0000 12/09/22 0000 12/15/22 1907  NA 133* 131*  132* 134* 137  --  143  --  137  K 3.9 3.2* 3.9 3.9 3.3*   < > 3.3* 3.3* 3.8  CL 104 104 105 106 103  --  107  --  105  CO2 20* 20* 20* 21* 27*  --  29*  --  22  GLUCOSE 104* 106* 108* 112*  --   --   --   --  110*  BUN 18 20 20 17 12   --  10  --  18  CREATININE 0.86 0.74 0.68 0.66 0.6  --  0.6  --  0.56  CALCIUM  8.2* 8.1* 8.3* 8.2* 8.2*  --  8.6*  --  8.6*  MG 1.9 2.6* 2.3  --   --   --   --   --   --   PHOS 3.3  --   --   --   --   --   --   --   --    < > = values in this interval not displayed.   Recent Labs    06/01/22 1238 06/03/22 1117 06/04/22 0416 08/10/22 0000 10/28/22 0000 11/18/22 0000  AST 25 22 17 17 16 16   ALT 16 13 10 9 19 11   ALKPHOS 60 51 43 77 58 76  BILITOT 1.3* 1.5* 1.3*  --   --   --   PROT 7.0 6.1* 5.2*  --   --   --   ALBUMIN 3.5 2.9* 2.4* 3.2* 2.7* 3.4*   Recent Labs    06/03/22 1117 06/03/22 1819 06/14/22 0000 08/10/22 0000 10/12/22 0127 10/15/22 0112 10/28/22 0000 12/15/22 1907  WBC 18.2*   < > 14.2   < > 13.9* 14.3* 7.4 9.6  NEUTROABS 15.1*  --  9,670.00  --   --   --  4,706.00  --   HGB 11.4*   < > 9.8*   < > 10.4* 10.2* 9.2* 10.0*  HCT 36.0   < > 30*   < > 32.6* 31.3* 29* 31.7*  MCV 100.6*   < >  --    < > 92.1 87.2  --  92.4  PLT 205   < > 308    < > 160 191 306 205   < > = values in this interval not displayed.   Lab Results  Component Value Date   TSH 6.30 (A) 01/06/2023   Lab Results  Component Value Date   HGBA1C 5.6 08/26/2022   Lab Results  Component Value Date   CHOL 150 12/09/2022   HDL 75 (A) 12/09/2022   LDLCALC 58 12/09/2022   TRIG 90 12/09/2022   CHOLHDL 2.5 04/07/2019    Significant Diagnostic Results in last 30 days:  No results found.  Assessment/Plan 1. Moderate dementia with mood disturbance, unspecified dementia type (HCC) (Primary)  Recurrent Falls.  Patient with continued, gradual decline in memory. Numerous falls likely contribute as she has had increased confusion with her most recent head injuries. Discussed with nursing high probability of concussion. Encourage rest and continued support with night and morning routine as those are the times she has had most injurious falls. Continue supportive care.   2. Moderate episode of recurrent major depressive disorder (HCC) Stable without acute decline or decompensation at this time. Continue sertraline . Encourage group participation and activities.   3. Rheumatoid arthritis, involving unspecified site, unspecified whether rheumatoid factor present (HCC) Pain well-controlled with injections, continue at this time.   4. Dissection of descending thoracic  aorta (HCC) BP well-controlled with current regimen, continue with q8 hour BP checks.   5. Acute respiratory failure with hypoxia (HCC) Resolved.     Family/ staff Communication: Nursing  Labs/tests ordered:  none

## 2023-04-21 ENCOUNTER — Encounter: Payer: Self-pay | Admitting: Student

## 2023-04-22 ENCOUNTER — Other Ambulatory Visit: Payer: Self-pay

## 2023-04-22 DIAGNOSIS — I7123 Aneurysm of the descending thoracic aorta, without rupture: Secondary | ICD-10-CM

## 2023-05-23 ENCOUNTER — Encounter: Payer: Self-pay | Admitting: Surgery

## 2023-05-23 ENCOUNTER — Ambulatory Visit: Payer: Medicare PPO | Admitting: Surgery

## 2023-05-30 ENCOUNTER — Ambulatory Visit: Payer: Medicare PPO | Admitting: Surgery

## 2023-06-07 ENCOUNTER — Encounter: Payer: Self-pay | Admitting: Nurse Practitioner

## 2023-06-07 ENCOUNTER — Non-Acute Institutional Stay (SKILLED_NURSING_FACILITY): Payer: Self-pay | Admitting: Nurse Practitioner

## 2023-06-07 DIAGNOSIS — H04123 Dry eye syndrome of bilateral lacrimal glands: Secondary | ICD-10-CM

## 2023-06-07 DIAGNOSIS — I10 Essential (primary) hypertension: Secondary | ICD-10-CM

## 2023-06-07 DIAGNOSIS — M069 Rheumatoid arthritis, unspecified: Secondary | ICD-10-CM | POA: Diagnosis not present

## 2023-06-07 DIAGNOSIS — R296 Repeated falls: Secondary | ICD-10-CM

## 2023-06-07 DIAGNOSIS — E782 Mixed hyperlipidemia: Secondary | ICD-10-CM

## 2023-06-07 DIAGNOSIS — E039 Hypothyroidism, unspecified: Secondary | ICD-10-CM

## 2023-06-07 DIAGNOSIS — F3342 Major depressive disorder, recurrent, in full remission: Secondary | ICD-10-CM | POA: Diagnosis not present

## 2023-06-07 NOTE — Progress Notes (Signed)
 Location:  Other Nursing Home Room Number: [facility] 304A Place of Service:  SNF [phone])  Sydnee Cabal, [place], MD  Patient Care Team: Earnestine Mealing, MD as PCP - General [facility] Medicine)  Extended Emergency Contact Information Primary Emergency Contact: [name] [place] Home Phone: [phone] Relation: Daughter Secondary Emergency Contact: Aurelio Jew Home Phone: [phone] Mobile Phone: [phone] Relation: Son  Goals of care: Advanced Directive information    06/07/2023    2:51 PM  Advanced Directives  Does Patient Have a Medical Advance Directive? Yes  Type of Advance Directive Out of facility DNR (pink MOST or yellow form)  Does patient want to make changes to medical advance directive? No - Patient declined     Chief Complaint  Patient presents with   Medical Management of Chronic Issues    Medical Management of Chronic Issues.     HPI:  Pt is a 89 y.o. female seen today for medical management of chronic disease.   Patient is sitting in her recliner this afternoon accompanied by her daughter.   She is relaxed, and resting but mentions she is upset about having to remain in [facility] when asked how she is doing.  She enjoys her plants and artwork in her room and is able to vividly recall some details and descriptions of her artwork.  She is eating her meals normally, drinks sweet tea with meals, and water when in her room. She mentions she is sleeping okay, wakes up once or twice during the night. She fell again last night in the bathroom in an unwitnessed fall. She states she slid off the toilet. She uses a rolling walker to get around. She does not like to call staff for assistance. She denies any injury after her fall. Vital signs monitored closely by nursing staff. 72 hour neuro checks ongoing.  She gets scheduled Remicaid infusions through rheumatology next one is scheduled for 07/12/23. Pain controlled on current regimen.    After a fall last year, she had a possible concussion after which she is still having trouble reading long sentences due to the words being "jumbled together". Her memory continues to decline.   Past Medical History:  Diagnosis Date   Actinic keratosis    Arthritis    Basal cell carcinoma    Left chin, right nasolabial   Collagen vascular disease (HCC)    RA   Heartburn    Hypertension    Squamous cell carcinoma of skin 05/06/2021   left hand, treated with [facility]   Past Surgical History:  Procedure Laterality Date   ABDOMINAL HYSTERECTOMY     BREAST CYST EXCISION     HIP ARTHROPLASTY Right 11/30/2019   Procedure: ARTHROPLASTY BIPOLAR HIP (HEMIARTHROPLASTY);  Surgeon: Christena Flake, MD;  Location: [facility] ORS;  Service: Orthopedics;  Laterality: Right;   SACROPLASTY N/A 12/17/2020   Procedure: SACROPLASTY;  Surgeon: Kennedy Bucker, MD;  Location: [facility] ORS;  Service: Orthopedics;  Laterality: N/A;   THORACIC AORTIC ENDOVASCULAR STENT GRAFT N/A 04/11/2019   Procedure: THORACIC AORTIC ENDOVASCULAR STENT GRAFT insertion;  Surgeon: Nada Libman, MD;  Location: [facility] OR;  Service: Vascular;  Laterality: N/A;   ULTRASOUND GUIDANCE FOR VASCULAR ACCESS Right 04/11/2019   Procedure: Ultrasound Guidance For Vascular Access, right femoral artery;  Surgeon: Nada Libman, MD;  Location: [facility] OR;  Service: Vascular;  Laterality: Right;    Allergies  Allergen Reactions   Amoxicillin Other (See Comments)    Intolerant  Daughter and Son report patient  tolerated course of Augmentin in January 2024   Codeine Nausea Only   Ibandronate Sodium  [Ibandronate] Other (See Comments)    Gi upset   Risedronate Other (See Comments)    Gi upset   Sulfa Antibiotics Hives    Outpatient Encounter Medications as of 06/07/2023  Medication Sig   acetaminophen (TYLENOL) 325 MG tablet Take 650 mg by mouth 2 (two) times daily.   acetaminophen (TYLENOL) 500 MG tablet Take 1,000 mg by mouth. Every 12 hours as needed.    amLODipine (NORVASC) 10 MG tablet Take 1 tablet (10 mg total) by mouth daily.   atorvastatin (LIPITOR) 20 MG tablet Take 20 mg by mouth daily.   Calcium Carbonate (CALCIUM 600 PO) Take 600 mg by mouth in the morning and at bedtime.   carvedilol (COREG) 12.5 MG tablet Take 1 tablet (12.5 mg total) by mouth 2 (two) times daily with a meal.   folic acid (FOLVITE) 1 MG tablet Take 1 mg by mouth daily.   hydrALAZINE (APRESOLINE) 10 MG tablet Take 10 mg by mouth every 8 (eight) hours as needed.   levothyroxine (SYNTHROID, LEVOTHROID) 75 MCG tablet Take 75 mcg by mouth at bedtime.   losartan (COZAAR) 100 MG tablet Take 1 tablet (100 mg total) by mouth daily.   methotrexate (RHEUMATREX) 2.5 MG tablet Take 15 mg by mouth every Friday.   Multiple Vitamins-Minerals (MULTIVITAMINS THER. W/MINERALS) TABS tablet Take 1 tablet by mouth daily.   predniSONE (DELTASONE) 2.5 MG tablet Take 2.5 mg by mouth daily.   Propylene Glycol (SYSTANE BALANCE OP) PRN order: Instill one drop in both eyes every 12 hours as needed for dry eyes.   senna (SENOKOT) 8.6 MG TABS tablet Take 1 tablet by mouth daily.   sertraline (ZOLOFT) 50 MG tablet Take 75 mg by mouth daily.   trimethoprim (TRIMPEX) 100 MG tablet Take 100 mg by mouth daily.   lidocaine (SALONPAS PAIN RELIEVING) 4 % Place 1 patch onto the skin daily. (Patient not taking: Reported on 06/07/2023)   [DISCONTINUED] sertraline (ZOLOFT) 100 MG tablet Take 100 mg by mouth daily.   No facility-administered encounter medications on file as of 06/07/2023.    Review of Systems  Constitutional: Negative.   HENT: Negative.    Eyes:  Positive for visual disturbance.  Respiratory: Negative.    Cardiovascular: Negative.   Gastrointestinal: Negative.   Endocrine: Negative.   Genitourinary: Negative.   Musculoskeletal:  Positive for myalgias.  Skin: Negative.   Hematological: Negative.   Psychiatric/Behavioral:  Positive for confusion.      Immunization History   Administered Date(s) Administered   Influenza Split 01/21/2014, 12/04/2015   Influenza, High Dose Seasonal PF 01/31/2017, 12/18/2018   Influenza-Unspecified 01/27/2015, 12/18/2018, 01/01/2020, 01/05/2021, 01/19/2022, 01/26/2023   Moderna Covid-19 Vaccine Bivalent Booster 26yrs & up 09/01/2021   Moderna Sars-Covid-2 Vaccination 04/23/2019, 05/21/2019, 02/19/2020, 07/11/2020, 01/29/2021   Pneumococcal Conjugate-13 03/15/2014   Pneumococcal Polysaccharide-23 04/05/2001, 08/13/2016   RSV,unspecified 01/19/2022   Td 07/04/2005   Td (Adult),5 Lf Tetanus Toxid, Preservative Free 02/15/2017   Tdap 07/27/2005, 02/15/2017   Zoster Recombinant(Shingrix) 12/29/2018, 06/10/2019   Zoster, Live 04/05/2006   Pertinent  Health Maintenance Due  Topic Date Due   INFLUENZA VACCINE  Completed   DEXA SCAN  Discontinued      12/17/2020    2:42 PM 12/18/2020    2:00 AM 12/18/2020    1:00 PM 03/28/2022    6:10 AM 01/04/2023   12:42 PM  Fall Risk  Falls in the  past year?     1  Was there an injury with Fall?     1  Fall Risk Category Calculator     3  (RETIRED) Patient Fall Risk Level High fall risk High fall risk High fall risk High fall risk    Functional Status Survey:    Vitals:   06/07/23 1441  BP: 113/62  Pulse: (!) 58  Resp: 16  Temp: 98 F (36.7 C)  SpO2: 94%  Weight: 147 lb 9.6 oz (67 kg)  Height: 5\' 6"  (1.676 m)   Body mass index is 23.82 kg/m. Physical Exam Vitals reviewed.  Constitutional:      Appearance: Normal appearance.  HENT:     Head: Normocephalic and atraumatic.     Mouth/Throat:     Pharynx: Oropharynx is clear.  Eyes:     Conjunctiva/sclera: Conjunctivae normal.  Cardiovascular:     Rate and Rhythm: Normal rate and regular rhythm.     Pulses: Normal pulses.     Heart sounds: Normal heart sounds.  Pulmonary:     Effort: Pulmonary effort is normal.     Breath sounds: Normal breath sounds.  Abdominal:     General: Bowel sounds are normal.     Palpations:  Abdomen is soft.  Musculoskeletal:        General: Tenderness present.     Cervical back: Neck supple.     Right knee: Tenderness present.     Left knee: Tenderness present.  Skin:    General: Skin is warm and dry.     Findings: Bruising present.  Neurological:     Mental Status: She is alert. Mental status is at baseline.  Psychiatric:        Mood and Affect: Mood is depressed.        Speech: Speech normal.        Behavior: Behavior is cooperative.        Cognition and Memory: Memory is impaired. She exhibits impaired recent memory.    Labs reviewed: Recent Labs    10/12/22 0127 10/13/22 0132 10/14/22 0157 10/15/22 0112 10/28/22 0000 11/04/22 0000 11/18/22 0000 12/09/22 0000 12/15/22 1907  NA 133* 131* 132* 134* 137  --  143  --  137  K 3.9 3.2* 3.9 3.9 3.3*   < > 3.3* 3.3* 3.8  CL 104 104 105 106 103  --  107  --  105  CO2 20* 20* 20* 21* 27*  --  29*  --  22  GLUCOSE 104* 106* 108* 112*  --   --   --   --  110*  BUN 18 20 20 17 12   --  10  --  18  CREATININE 0.86 0.74 0.68 0.66 0.6  --  0.6  --  0.56  CALCIUM 8.2* 8.1* 8.3* 8.2* 8.2*  --  8.6*  --  8.6*  MG 1.9 2.6* 2.3  --   --   --   --   --   --   PHOS 3.3  --   --   --   --   --   --   --   --    < > = values in this interval not displayed.   Recent Labs    08/10/22 0000 10/28/22 0000 11/18/22 0000  AST 17 16 16   ALT 9 19 11   ALKPHOS 77 58 76  ALBUMIN 3.2* 2.7* 3.4*   Recent Labs    06/14/22 0000 08/10/22 0000 10/12/22 0127  10/15/22 0112 10/28/22 0000 12/15/22 1907  WBC 14.2   < > 13.9* 14.3* 7.4 9.6  NEUTROABS 9,670.00  --   --   --  4,706.00  --   HGB 9.8*   < > 10.4* 10.2* 9.2* 10.0*  HCT 30*   < > 32.6* 31.3* 29* 31.7*  MCV  --    < > 92.1 87.2  --  92.4  PLT 308   < > 160 191 306 205   < > = values in this interval not displayed.   Lab Results  Component Value Date   TSH 6.30 (A) 01/06/2023   Lab Results  Component Value Date   HGBA1C 5.6 08/26/2022   Lab Results  Component  Value Date   CHOL 150 12/09/2022   HDL 75 (A) 12/09/2022   LDLCALC 58 12/09/2022   TRIG 90 12/09/2022   CHOLHDL 2.5 04/07/2019    Significant Diagnostic Results in last 30 days:  No results found.  Assessment/Plan  1. Hypothyroidism, unspecified type (Primary) - TSH from October 2024 6.30, will continue to monitor - Continue levothyroxine 75 mcg tablet as prescribed  2. Rheumatoid arthritis, involving unspecified site, unspecified whether rheumatoid factor present (HCC) - Stable, patient denies any current joint pain - Apply Voltaren gel to bilateral knees QID scheduled for chronic bilateral knee pain - Continue Remicaid infusions as prescribed per rheumatology with low dose prednisone  3. Recurrent major depressive disorder, in full remission (HCC) - At patient's baseline since coming to [facility] - Continue Zoloft 75 mg tablet daily - recently with GDR, no significant change in mood or behaviors.   4. Dry eyes Ongoing, does not ask for eye drops and they are PRN - Systane balance eye drops, put 1 drop in both eyes twice daily scheduled  5. Recurrent falls - Educated patient about fall prevention and importance of calling staff for assistance - Keep alarms on patient at all times   6. Essential hypertension - Stable, BP 113/62 today - Continue amlodipine, carvedilol, hydralazine, losartan as prescribed  7. Mixed hyperlipidemia - Stable, continue Lipitor 20 mg tablet daily - Lipid panel from September 2024 results normal   Irfat Habib, DNP-AGPCNP Student -I personally was present during the history, physical exam and medical decision-making activities of this service and have verified that the service and findings are accurately documented in the student's note Micholas Drumwright K. Biagio Borg [facility] [phone]

## 2023-06-07 NOTE — Progress Notes (Deleted)
 Location:      Place of Service:     Earnestine Mealing, MD  Patient Care Team: Earnestine Mealing, MD as PCP - General [facility])  Extended Emergency Contact Information Primary Emergency Contact: [name] [place] Home Phone: [phone] Relation: Daughter Secondary Emergency Contact: Aurelio Jew Home Phone: [phone] Mobile Phone: [phone] Relation: Son  Goals of care: Advanced Directive information    04/04/2023    4:23 PM  Advanced Directives  Does Patient Have a Medical Advance Directive? Yes  Type of Advance Directive Out of facility DNR (pink MOST or yellow form)  Does patient want to make changes to medical advance directive? No - Patient declined  Pre-existing out of facility DNR order (yellow form or pink MOST form) Yellow form placed in chart (order not valid for inpatient use)     No chief complaint on file.   HPI:  Pt is a 89 y.o. female seen today for medical management of chronic disease. ***   Past Medical History:  Diagnosis Date   Actinic keratosis    Arthritis    Basal cell carcinoma    Left chin, right nasolabial   Collagen vascular disease (HCC)    RA   Heartburn    Hypertension    Squamous cell carcinoma of skin 05/06/2021   left hand, treated with [facility]   Past Surgical History:  Procedure Laterality Date   ABDOMINAL HYSTERECTOMY     BREAST CYST EXCISION     HIP ARTHROPLASTY Right 11/30/2019   Procedure: ARTHROPLASTY BIPOLAR HIP (HEMIARTHROPLASTY);  Surgeon: Christena Flake, MD;  Location: [facility] ORS;  Service: Orthopedics;  Laterality: Right;   SACROPLASTY N/A 12/17/2020   Procedure: SACROPLASTY;  Surgeon: Kennedy Bucker, MD;  Location: [facility] ORS;  Service: Orthopedics;  Laterality: N/A;   THORACIC AORTIC ENDOVASCULAR STENT GRAFT N/A 04/11/2019   Procedure: THORACIC AORTIC ENDOVASCULAR STENT GRAFT insertion;  Surgeon: Nada Libman, MD;  Location: [facility] OR;  Service: Vascular;  Laterality: N/A;    ULTRASOUND GUIDANCE FOR VASCULAR ACCESS Right 04/11/2019   Procedure: Ultrasound Guidance For Vascular Access, right femoral artery;  Surgeon: Nada Libman, MD;  Location: [facility] OR;  Service: Vascular;  Laterality: Right;    Allergies  Allergen Reactions   Amoxicillin Other (See Comments)    Intolerant  Daughter and Son report patient tolerated course of Augmentin in January 2024   Codeine Nausea Only   Ibandronate Sodium  [Ibandronate] Other (See Comments)    Gi upset   Risedronate Other (See Comments)    Gi upset   Sulfa Antibiotics Hives    Outpatient Encounter Medications as of 06/07/2023  Medication Sig   acetaminophen (TYLENOL) 325 MG tablet Take 650 mg by mouth 2 (two) times daily.   acetaminophen (TYLENOL) 500 MG tablet Take 1,000 mg by mouth. Every 12 hours as needed.   amLODipine (NORVASC) 10 MG tablet Take 1 tablet (10 mg total) by mouth daily.   atorvastatin (LIPITOR) 20 MG tablet Take 20 mg by mouth daily.   Calcium Carbonate (CALCIUM 600 PO) Take 600 mg by mouth in the morning and at bedtime.   carvedilol (COREG) 12.5 MG tablet Take 1 tablet (12.5 mg total) by mouth 2 (two) times daily with a meal.   folic acid (FOLVITE) 1 MG tablet Take 1 mg by mouth daily.   hydrALAZINE (APRESOLINE) 10 MG tablet Take 10 mg by mouth every 8 (eight) hours as needed.   levothyroxine (SYNTHROID, LEVOTHROID) 75 MCG tablet Take 75 mcg  by mouth at bedtime.   lidocaine (SALONPAS PAIN RELIEVING) 4 % Place 1 patch onto the skin daily.   losartan (COZAAR) 100 MG tablet Take 1 tablet (100 mg total) by mouth daily.   methotrexate (RHEUMATREX) 2.5 MG tablet Take 15 mg by mouth every Friday.   Multiple Vitamins-Minerals (MULTIVITAMINS THER. W/MINERALS) TABS tablet Take 1 tablet by mouth daily.   predniSONE (DELTASONE) 2.5 MG tablet Take 2.5 mg by mouth daily.   Propylene Glycol (SYSTANE BALANCE OP) PRN order: Instill one drop in both eyes every 12 hours as needed for dry eyes.   senna (SENOKOT) 8.6  MG TABS tablet Take 1 tablet by mouth daily.   sertraline (ZOLOFT) 100 MG tablet Take 100 mg by mouth daily.   trimethoprim (TRIMPEX) 100 MG tablet Take 100 mg by mouth daily.   No facility-administered encounter medications on file as of 06/07/2023.    Review of Systems ***  Immunization History  Administered Date(s) Administered   Influenza Split 01/21/2014, 12/04/2015   Influenza, High Dose Seasonal PF 01/31/2017, 12/18/2018   Influenza-Unspecified 01/27/2015, 12/18/2018, 01/01/2020, 01/05/2021, 01/19/2022, 01/26/2023   Moderna Covid-19 Vaccine Bivalent Booster 37yrs & up 09/01/2021   Moderna Sars-Covid-2 Vaccination 04/23/2019, 05/21/2019, 02/19/2020, 07/11/2020, 01/29/2021   Pneumococcal Conjugate-13 03/15/2014   Pneumococcal Polysaccharide-23 04/05/2001, 08/13/2016   RSV,unspecified 01/19/2022   Td 07/04/2005   Td (Adult),5 Lf Tetanus Toxid, Preservative Free 02/15/2017   Tdap 07/27/2005, 02/15/2017   Zoster Recombinant(Shingrix) 12/29/2018, 06/10/2019   Zoster, Live 04/05/2006   Pertinent  Health Maintenance Due  Topic Date Due   INFLUENZA VACCINE  Completed   DEXA SCAN  Discontinued      12/17/2020    2:42 PM 12/18/2020    2:00 AM 12/18/2020    1:00 PM 03/28/2022    6:10 AM 01/04/2023   12:42 PM  Fall Risk  Falls in the past year?     1  Was there an injury with Fall?     1  Fall Risk Category Calculator     3  (RETIRED) Patient Fall Risk Level High fall risk High fall risk High fall risk High fall risk    Functional Status Survey:    There were no vitals filed for this visit. There is no height or weight on file to calculate BMI. Physical Exam***  Labs reviewed: Recent Labs    10/12/22 0127 10/13/22 0132 10/14/22 0157 10/15/22 0112 10/28/22 0000 11/04/22 0000 11/18/22 0000 12/09/22 0000 12/15/22 1907  NA 133* 131* 132* 134* 137  --  143  --  137  K 3.9 3.2* 3.9 3.9 3.3*   < > 3.3* 3.3* 3.8  CL 104 104 105 106 103  --  107  --  105  CO2 20* 20* 20*  21* 27*  --  29*  --  22  GLUCOSE 104* 106* 108* 112*  --   --   --   --  110*  BUN 18 20 20 17 12   --  10  --  18  CREATININE 0.86 0.74 0.68 0.66 0.6  --  0.6  --  0.56  CALCIUM 8.2* 8.1* 8.3* 8.2* 8.2*  --  8.6*  --  8.6*  MG 1.9 2.6* 2.3  --   --   --   --   --   --   PHOS 3.3  --   --   --   --   --   --   --   --    < > =  values in this interval not displayed.   Recent Labs    08/10/22 0000 10/28/22 0000 11/18/22 0000  AST 17 16 16   ALT 9 19 11   ALKPHOS 77 58 76  ALBUMIN 3.2* 2.7* 3.4*   Recent Labs    06/14/22 0000 08/10/22 0000 10/12/22 0127 10/15/22 0112 10/28/22 0000 12/15/22 1907  WBC 14.2   < > 13.9* 14.3* 7.4 9.6  NEUTROABS 9,670.00  --   --   --  4,706.00  --   HGB 9.8*   < > 10.4* 10.2* 9.2* 10.0*  HCT 30*   < > 32.6* 31.3* 29* 31.7*  MCV  --    < > 92.1 87.2  --  92.4  PLT 308   < > 160 191 306 205   < > = values in this interval not displayed.   Lab Results  Component Value Date   TSH 6.30 (A) 01/06/2023   Lab Results  Component Value Date   HGBA1C 5.6 08/26/2022   Lab Results  Component Value Date   CHOL 150 12/09/2022   HDL 75 (A) 12/09/2022   LDLCALC 58 12/09/2022   TRIG 90 12/09/2022   CHOLHDL 2.5 04/07/2019    Significant Diagnostic Results in last 30 days:  No results found.  Assessment/Plan No problem-specific Assessment & Plan notes found for this encounter.     Janene Harvey. Biagio Borg [facility] [phone]

## 2023-08-05 ENCOUNTER — Non-Acute Institutional Stay (SKILLED_NURSING_FACILITY): Payer: Self-pay | Admitting: Student

## 2023-08-05 ENCOUNTER — Encounter: Payer: Self-pay | Admitting: Student

## 2023-08-05 DIAGNOSIS — I739 Peripheral vascular disease, unspecified: Secondary | ICD-10-CM

## 2023-08-05 DIAGNOSIS — K219 Gastro-esophageal reflux disease without esophagitis: Secondary | ICD-10-CM

## 2023-08-05 DIAGNOSIS — I71012 Dissection of descending thoracic aorta: Secondary | ICD-10-CM | POA: Diagnosis not present

## 2023-08-05 DIAGNOSIS — F03B3 Unspecified dementia, moderate, with mood disturbance: Secondary | ICD-10-CM

## 2023-08-05 DIAGNOSIS — E039 Hypothyroidism, unspecified: Secondary | ICD-10-CM

## 2023-08-05 DIAGNOSIS — F331 Major depressive disorder, recurrent, moderate: Secondary | ICD-10-CM

## 2023-08-05 NOTE — Progress Notes (Unsigned)
 Location:  Other [place].  Nursing Home Room Number: [facility] [address] Place of Service:  SNF [phone]) Provider:  Valrie Gehrig, MD  Patient Care Team: Valrie Gehrig, MD as PCP - General [facility])  Extended Emergency Contact Information Primary Emergency Contact: Gloria [name]  [place] Home Phone: [phone] Relation: Daughter Secondary Emergency Contact: Pernell Brain Home Phone: [phone] Mobile Phone: [phone] Relation: Son  Code Status:  DNR Goals of care: Advanced Directive information    06/07/2023    2:51 PM  Advanced Directives  Does Patient Have a Medical Advance Directive? Yes  Type of Advance Directive Out of facility DNR (pink MOST or yellow form)  Does patient want to make changes to medical advance directive? No - Patient declined     Chief Complaint  Patient presents with   Medical Management of Chronic Issues    Medical Management of Chronic Issues.     HPI:  Pt is a 89 y.o. [name] seen today for medical management of chronic diseases.    Patient is enjoying activities in the community. When asked directly she states the opposite.   Nursing without acute concerns at this time.   Recently installed an AI device to help redirect patient to ask for help when going to the restroom, etc.  Past Medical History:  Diagnosis Date   Actinic keratosis    Arthritis    Basal cell carcinoma    Left chin, right nasolabial   Collagen vascular disease (HCC)    RA   Heartburn    Hypertension    Squamous cell carcinoma of skin 05/06/2021   left hand, [facility] with [facility]   Past Surgical History:  Procedure Laterality Date   ABDOMINAL HYSTERECTOMY     BREAST CYST EXCISION     HIP ARTHROPLASTY Right 11/30/2019   Procedure: ARTHROPLASTY BIPOLAR HIP (HEMIARTHROPLASTY);  Surgeon: Elner Hahn, MD;  Location: [facility] ORS;  Service: Orthopedics;  Laterality: Right;   SACROPLASTY N/A 12/17/2020   Procedure: SACROPLASTY;  Surgeon:  Molli Angelucci, MD;  Location: [facility] ORS;  Service: Orthopedics;  Laterality: N/A;   THORACIC AORTIC ENDOVASCULAR STENT GRAFT N/A 04/11/2019   Procedure: THORACIC AORTIC ENDOVASCULAR STENT GRAFT insertion;  Surgeon: Margherita Shell, MD;  Location: [facility] OR;  Service: Vascular;  Laterality: N/A;   ULTRASOUND GUIDANCE FOR VASCULAR ACCESS Right 04/11/2019   Procedure: Ultrasound Guidance For Vascular Access, right femoral artery;  Surgeon: Margherita Shell, MD;  Location: [facility] OR;  Service: Vascular;  Laterality: Right;    Allergies  Allergen Reactions   Amoxicillin  Other (See Comments)    Intolerant  Daughter and Son report patient tolerated course of Augmentin  in January 2024   Codeine Nausea Only   Ibandronate Sodium  [Ibandronate] Other (See Comments)    Gi upset   Risedronate Other (See Comments)    Gi upset   Sulfa Antibiotics Hives    Outpatient Encounter Medications as of 08/05/2023  Medication Sig   acetaminophen  (TYLENOL ) 325 MG tablet Take 650 mg by mouth 2 (two) times daily.   acetaminophen  (TYLENOL ) 500 MG tablet Take 1,000 mg by mouth. Every 12 hours as needed.   amLODipine  (NORVASC ) 10 MG tablet Take 1 tablet (10 mg total) by mouth daily.   atorvastatin  (LIPITOR) 20 MG tablet Take 20 mg by mouth daily.   Calcium  Carbonate (CALCIUM  600 PO) Take 600 mg by mouth in the morning and at bedtime.   carvedilol  (COREG ) 12.5 MG tablet Take 1 tablet (12.5 mg total) by  mouth 2 (two) times daily with a meal.   diclofenac Sodium (VOLTAREN) 1 % GEL Apply 4 g topically. Apply to both knees every day and evening shift.   folic acid  (FOLVITE ) 1 MG tablet Take 1 mg by mouth daily.   hydrALAZINE  (APRESOLINE ) 10 MG tablet Take 10 mg by mouth every 12 (twelve) hours as needed. For SBP greater than 140.   levothyroxine  (SYNTHROID , LEVOTHROID) 75 MCG tablet Take 75 mcg by mouth at bedtime.   losartan  (COZAAR ) 100 MG tablet Take 1 tablet (100 mg total) by mouth daily.   methotrexate  (RHEUMATREX) 2.5 MG  tablet Take 15 mg by mouth every Friday.   Multiple Vitamins-Minerals (MULTIVITAMINS THER. W/MINERALS) TABS tablet Take 1 tablet by mouth daily.   predniSONE  (DELTASONE ) 2.5 MG tablet Take 2.5 mg by mouth daily.   Propylene Glycol (SYSTANE BALANCE OP) PRN order: Instill one drop in both eyes every 12 hours as needed for dry eyes.   senna (SENOKOT) 8.6 MG TABS tablet Take 1 tablet by mouth daily.   sertraline  (ZOLOFT ) 50 MG tablet Take 75 mg by mouth daily.   trimethoprim (TRIMPEX) 100 MG tablet Take 100 mg by mouth daily.   lidocaine  (SALONPAS PAIN RELIEVING) 4 % Place 1 patch onto the skin daily. (Patient not taking: Reported on 08/05/2023)   No facility-administered encounter medications on file as of 08/05/2023.    Review of Systems  Immunization History  Administered Date(s) Administered   Influenza Split 01/21/2014, 12/04/2015   Influenza, High Dose Seasonal PF 01/31/2017, 12/18/2018   Influenza-Unspecified 01/27/2015, 12/18/2018, 01/01/2020, 01/05/2021, 01/19/2022, 01/26/2023   Moderna Covid-19 Vaccine Bivalent Booster 21yrs & up 09/01/2021   Moderna Sars-Covid-2 Vaccination 04/23/2019, 05/21/2019, 02/19/2020, 07/11/2020, 01/29/2021   Pneumococcal Conjugate-13 03/15/2014   Pneumococcal Polysaccharide-23 04/05/2001, 08/13/2016   RSV,unspecified 01/19/2022   Td 07/04/2005   Td (Adult),5 Lf Tetanus Toxid, Preservative Free 02/15/2017   Tdap 07/27/2005, 02/15/2017   Zoster Recombinant(Shingrix) 12/29/2018, 06/10/2019   Zoster, Live 04/05/2006   Pertinent  Health Maintenance Due  Topic Date Due   INFLUENZA VACCINE  11/04/2023   DEXA SCAN  Discontinued      12/17/2020    2:42 PM 12/18/2020    2:00 AM 12/18/2020    1:00 PM 03/28/2022    6:10 AM 01/04/2023   12:42 PM  Fall Risk  Falls in the past year?     1  Was there an injury with Fall?     1  Fall Risk Category Calculator     3  (RETIRED) Patient Fall Risk Level High fall risk High fall risk High fall risk High fall risk     Functional Status Survey:    Vitals:   08/05/23 0824  BP: 138/68  Pulse: (!) 58  Resp: 16  Temp: 98.1 F (36.7 C)  SpO2: 96%  Weight: 150 lb 3.2 oz (68.1 kg)  Height: 5\' 6"  (1.676 m)   Body mass index is 24.24 kg/m. Physical Exam Constitutional:      Appearance: Normal appearance.  Cardiovascular:     Rate and Rhythm: Normal rate.  Pulmonary:     Effort: Pulmonary effort is normal.  Musculoskeletal:        General: No swelling or tenderness.  Skin:    General: Skin is warm and dry.     Capillary Refill: Capillary refill takes 2 to 3 seconds.  Neurological:     Mental Status: She is alert.  Psychiatric:        Mood and Affect: Mood  normal.        Thought Content: Thought content normal.     Labs reviewed: Recent Labs    10/12/22 0127 10/13/22 0132 10/14/22 0157 10/15/22 0112 10/28/22 0000 11/04/22 0000 11/18/22 0000 12/09/22 0000 12/15/22 1907  NA 133* 131* 132* 134* 137  --  143  --  137  K 3.9 3.2* 3.9 3.9 3.3*   < > 3.3* 3.3* 3.8  CL 104 104 105 106 103  --  107  --  105  CO2 20* 20* 20* 21* 27*  --  29*  --  22  GLUCOSE 104* 106* 108* 112*  --   --   --   --  110*  BUN 18 20 20 17 12   --  10  --  18  CREATININE 0.86 0.Gloria 0.68 0.66 0.6  --  0.6  --  0.56  CALCIUM  8.2* 8.1* 8.3* 8.2* 8.2*  --  8.6*  --  8.6*  MG 1.9 2.6* 2.3  --   --   --   --   --   --   PHOS 3.3  --   --   --   --   --   --   --   --    < > = values in this interval not displayed.   Recent Labs    08/10/22 0000 10/28/22 0000 11/18/22 0000  AST 17 16 16   ALT 9 19 11   ALKPHOS 77 58 76  ALBUMIN 3.2* 2.7* 3.4*   Recent Labs    10/12/22 0127 10/15/22 0112 10/28/22 0000 12/15/22 1907  WBC 13.9* 14.3* 7.4 9.6  NEUTROABS  --   --  4,706.00  --   HGB 10.4* 10.2* 9.2* 10.0*  HCT 32.6* 31.3* 29* 31.7*  MCV 92.1 87.2  --  92.4  PLT 160 191 306 205   Lab Results  Component Value Date   TSH 6.30 (A) 01/06/2023   Lab Results  Component Value Date   HGBA1C 5.6 08/26/2022    Lab Results  Component Value Date   CHOL 150 12/09/2022   HDL 75 (A) 12/09/2022   LDLCALC 58 12/09/2022   TRIG 90 12/09/2022   CHOLHDL 2.5 04/07/2019    Significant Diagnostic Results in last 30 days:  No results found.  Assessment/Plan Dissection of descending thoracic aorta (HCC)  Peripheral vascular disease (HCC)  Gastroesophageal reflux disease without esophagitis  Hypothyroidism, unspecified type  Moderate dementia with mood disturbance, unspecified dementia type (HCC)  Moderate episode of recurrent major depressive disorder (HCC) Patient is doing well. BP well-controlled at this goal. Non-surgical interventions for aortic dissection. NO pain with PVD, however, at risk for wounds. Continue to monitor for changes in skin. Thyroid above goal, pending May lab results. Adjust levothyroxine  as indicated. At this time dose reduction in patient's depression medication would negatively impact her quality of life and she has failed dose reduction in the last 6 months. Will continue at current dose. Dementia stable at Stage 6c at this time. Continue supportive care.   Family/ staff Communication: nursing  Labs/tests ordered:  q73mo labs

## 2023-08-06 ENCOUNTER — Encounter: Payer: Self-pay | Admitting: Student

## 2023-10-13 ENCOUNTER — Non-Acute Institutional Stay (SKILLED_NURSING_FACILITY): Payer: Self-pay | Admitting: Nurse Practitioner

## 2023-10-13 ENCOUNTER — Encounter: Payer: Self-pay | Admitting: Nurse Practitioner

## 2023-10-13 DIAGNOSIS — E039 Hypothyroidism, unspecified: Secondary | ICD-10-CM

## 2023-10-13 DIAGNOSIS — F331 Major depressive disorder, recurrent, moderate: Secondary | ICD-10-CM | POA: Diagnosis not present

## 2023-10-13 DIAGNOSIS — E782 Mixed hyperlipidemia: Secondary | ICD-10-CM

## 2023-10-13 DIAGNOSIS — D649 Anemia, unspecified: Secondary | ICD-10-CM

## 2023-10-13 DIAGNOSIS — F03B3 Unspecified dementia, moderate, with mood disturbance: Secondary | ICD-10-CM

## 2023-10-13 DIAGNOSIS — R296 Repeated falls: Secondary | ICD-10-CM

## 2023-10-13 DIAGNOSIS — N390 Urinary tract infection, site not specified: Secondary | ICD-10-CM

## 2023-10-13 DIAGNOSIS — M069 Rheumatoid arthritis, unspecified: Secondary | ICD-10-CM

## 2023-10-13 DIAGNOSIS — I10 Essential (primary) hypertension: Secondary | ICD-10-CM

## 2023-10-13 NOTE — Assessment & Plan Note (Signed)
 Continues on synthroid  Follow TSH

## 2023-10-13 NOTE — Assessment & Plan Note (Signed)
 Chronic disease, follow up cbc every 6 months.

## 2023-10-13 NOTE — Assessment & Plan Note (Signed)
 Continues on zoloft  75 mg daily, would not adjust medication due to increase risk of worsening depression.

## 2023-10-13 NOTE — Assessment & Plan Note (Signed)
 Blood pressure well controlled, goal bp <140/90 Continue current medications and dietary modifications follow metabolic panel

## 2023-10-13 NOTE — Assessment & Plan Note (Signed)
 High fall risk, multiple interventions in place to reduce falls.

## 2023-10-13 NOTE — Progress Notes (Signed)
 Location:  Other [place].  Nursing Home Room Number: [facility] [id number] Place of Service:  SNF [phone]) Harlene An, NP  PCP: Abdul Fine, MD  Patient Care Team: Abdul Fine, MD as PCP - General [facility] Medicine)  Extended Emergency Contact Information Primary Emergency Contact: Lupton,Barbara  [place] Home Phone: [phone] Relation: Daughter Secondary Emergency Contact: Maple Bridgette Lenis Home Phone: [phone] Mobile Phone: [phone] Relation: Son  Goals of care: Advanced Directive information    10/13/2023    9:48 AM  Advanced Directives  Does Patient Have a Medical Advance Directive? Yes  Type of Advance Directive Out of facility DNR (pink MOST or yellow form)  Does patient want to make changes to medical advance directive? No - Patient declined     Chief Complaint  Patient presents with   Medical Management of Chronic Issues    Medical Management of Chronic Issues.     HPI:  Pt is a 89 y.o. female seen today for medical management of chronic disease. Pt with worsening dementia, depression, htn, RA.  She reports she does not like living in facility however is involved in activities and overall does not appear to have worsening depression.  She reports her appetite is good and weight has been stable.  No pain at this time.  Staff does not have concerns at this time.    Past Medical History:  Diagnosis Date   Actinic keratosis    Arthritis    Basal cell carcinoma    Left chin, right nasolabial   Closed pelvic fracture (HCC) 12/16/2020   Closed right hip fracture (HCC) 11/30/2019   Collagen vascular disease (HCC)    RA   GERD 02/01/2007   Qualifier: Diagnosis of   By: Jimmy MD, Charlie Scarlet        Heartburn    Hip fracture (HCC) 11/30/2019   History of colonic polyps 02/01/2007   Qualifier: Diagnosis of   By: Jimmy MD, Charlie Scarlet     IMO SNOMED Dx Update Oct 2024     History of fracture of right hip 11/30/2019    Formatting of this note might be different from the original.  8/21; s/p THA     Hypertension    Squamous cell carcinoma of skin 05/06/2021   left hand, treated with [facility]   Past Surgical History:  Procedure Laterality Date   ABDOMINAL HYSTERECTOMY     BREAST CYST EXCISION     HIP ARTHROPLASTY Right 11/30/2019   Procedure: ARTHROPLASTY BIPOLAR HIP (HEMIARTHROPLASTY);  Surgeon: Edie Norleen PARAS, MD;  Location: [facility] ORS;  Service: Orthopedics;  Laterality: Right;   SACROPLASTY N/A 12/17/2020   Procedure: SACROPLASTY;  Surgeon: Kathlynn Sharper, MD;  Location: [facility] ORS;  Service: Orthopedics;  Laterality: N/A;   THORACIC AORTIC ENDOVASCULAR STENT GRAFT N/A 04/11/2019   Procedure: THORACIC AORTIC ENDOVASCULAR STENT GRAFT insertion;  Surgeon: Serene Gaile ORN, MD;  Location: [facility] OR;  Service: Vascular;  Laterality: N/A;   ULTRASOUND GUIDANCE FOR VASCULAR ACCESS Right 04/11/2019   Procedure: Ultrasound Guidance For Vascular Access, right femoral artery;  Surgeon: Serene Gaile ORN, MD;  Location: [facility] OR;  Service: Vascular;  Laterality: Right;    Allergies  Allergen Reactions   Amoxicillin  Other (See Comments)    Intolerant  Daughter and Son report patient tolerated course of Augmentin  in January 2024   Codeine Nausea Only   Ibandronate Sodium  [Ibandronate] Other (See Comments)    Gi upset   Risedronate Other (See Comments)    Gi  upset   Sulfa Antibiotics Hives    Outpatient Encounter Medications as of 10/13/2023  Medication Sig   acetaminophen  (TYLENOL ) 325 MG tablet Take 650 mg by mouth 2 (two) times daily.   acetaminophen  (TYLENOL ) 500 MG tablet Take 1,000 mg by mouth. Every 12 hours as needed.   amLODipine  (NORVASC ) 10 MG tablet Take 1 tablet (10 mg total) by mouth daily.   atorvastatin  (LIPITOR) 20 MG tablet Take 20 mg by mouth daily.   Calcium  Carbonate (CALCIUM  600 PO) Take 600 mg by mouth in the morning and at bedtime.   carvedilol  (COREG ) 12.5 MG tablet Take 1 tablet (12.5 mg total) by  mouth 2 (two) times daily with a meal.   diclofenac Sodium (VOLTAREN) 1 % GEL Apply 4 g topically. Apply to both knees every day and evening shift.   folic acid  (FOLVITE ) 1 MG tablet Take 1 mg by mouth daily.   hydrALAZINE  (APRESOLINE ) 10 MG tablet Take 10 mg by mouth every 12 (twelve) hours as needed. For SBP greater than 140.   levothyroxine  (SYNTHROID , LEVOTHROID) 75 MCG tablet Take 75 mcg by mouth at bedtime.   losartan  (COZAAR ) 100 MG tablet Take 1 tablet (100 mg total) by mouth daily.   methotrexate  (RHEUMATREX) 2.5 MG tablet Take 15 mg by mouth every Friday.   Multiple Vitamins-Minerals (MULTIVITAMINS THER. W/MINERALS) TABS tablet Take 1 tablet by mouth daily.   predniSONE  (DELTASONE ) 2.5 MG tablet Take 2.5 mg by mouth daily.   Propylene Glycol (SYSTANE BALANCE OP) PRN order: Instill one drop in both eyes every 12 hours as needed for dry eyes.   senna (SENOKOT) 8.6 MG TABS tablet Take 1 tablet by mouth daily.   sertraline  (ZOLOFT ) 50 MG tablet Take 75 mg by mouth daily.   trimethoprim (TRIMPEX) 100 MG tablet Take 100 mg by mouth daily.   No facility-administered encounter medications on file as of 10/13/2023.    Review of Systems  Constitutional:  Negative for activity change, appetite change, fatigue and unexpected weight change.  HENT:  Negative for congestion and hearing loss.   Eyes: Negative.   Respiratory:  Negative for cough and shortness of breath.   Cardiovascular:  Negative for chest pain, palpitations and leg swelling.  Gastrointestinal:  Negative for abdominal pain, constipation and diarrhea.  Genitourinary:  Negative for difficulty urinating and dysuria.  Musculoskeletal:  Negative for arthralgias and myalgias.  Skin:  Negative for color change and wound.  Neurological:  Negative for dizziness and weakness.  Psychiatric/Behavioral:  Negative for agitation, behavioral problems and confusion.      Immunization History  Administered Date(s) Administered   Influenza  Split 01/21/2014, 12/04/2015   Influenza, High Dose Seasonal PF 01/31/2017, 12/18/2018   Influenza-Unspecified 01/27/2015, 12/18/2018, 01/01/2020, 01/05/2021, 01/19/2022, 01/26/2023   Moderna Covid-19 Vaccine Bivalent Booster 36yrs & up 09/01/2021   Moderna Sars-Covid-2 Vaccination 04/23/2019, 05/21/2019, 02/19/2020, 07/11/2020, 01/29/2021   Pneumococcal Conjugate-13 03/15/2014   Pneumococcal Polysaccharide-23 04/05/2001, 08/13/2016   RSV,unspecified 01/19/2022   Td 07/04/2005   Td (Adult),5 Lf Tetanus Toxid, Preservative Free 02/15/2017   Tdap 07/27/2005, 02/15/2017   Zoster Recombinant(Shingrix) 12/29/2018, 06/10/2019   Zoster, Live 04/05/2006   Pertinent  Health Maintenance Due  Topic Date Due   INFLUENZA VACCINE  11/04/2023   DEXA SCAN  Discontinued      12/17/2020    2:42 PM 12/18/2020    2:00 AM 12/18/2020    1:00 PM 03/28/2022    6:10 AM 01/04/2023   12:42 PM  Fall Risk  Falls  in the past year?     1  Was there an injury with Fall?     1  Fall Risk Category Calculator     3  (RETIRED) Patient Fall Risk Level High fall risk  High fall risk  High fall risk  High fall risk       Data saved with a previous flowsheet row definition   Functional Status Survey:    Vitals:   10/13/23 0941 10/13/23 1101  BP: (!) 158/67 119/60  Pulse: (!) 58   Resp: 16   Temp: 98 F (36.7 C)   SpO2: 98%   Weight: 160 lb (72.6 kg)   Height: 5' 6 (1.676 m)    Body mass index is 25.82 kg/m. Physical Exam Constitutional:      General: She is not in acute distress.    Appearance: She is well-developed. She is not diaphoretic.  HENT:     Head: Normocephalic and atraumatic.     Mouth/Throat:     Pharynx: No oropharyngeal exudate.  Eyes:     Conjunctiva/sclera: Conjunctivae normal.     Pupils: Pupils are equal, round, and reactive to light.  Cardiovascular:     Rate and Rhythm: Normal rate and regular rhythm.     Heart sounds: Normal heart sounds.  Pulmonary:     Effort: Pulmonary  effort is normal.     Breath sounds: Normal breath sounds.  Abdominal:     General: Bowel sounds are normal.     Palpations: Abdomen is soft.  Musculoskeletal:     Cervical back: Normal range of motion and neck supple.     Right lower leg: No edema.     Left lower leg: No edema.  Skin:    General: Skin is warm and dry.  Neurological:     Mental Status: She is alert. Mental status is at baseline.     Motor: Weakness present.     Gait: Gait abnormal.  Psychiatric:        Mood and Affect: Mood normal.     Labs reviewed: Recent Labs    10/14/22 0157 10/15/22 0112 10/28/22 0000 11/04/22 0000 11/18/22 0000 12/09/22 0000 12/15/22 1907  NA 132* 134* 137  --  143  --  137  K 3.9 3.9 3.3*   < > 3.3* 3.3* 3.8  CL 105 106 103  --  107  --  105  CO2 20* 21* 27*  --  29*  --  22  GLUCOSE 108* 112*  --   --   --   --  110*  BUN 20 17 12   --  10  --  18  CREATININE 0.68 0.66 0.6  --  0.6  --  0.56  CALCIUM  8.3* 8.2* 8.2*  --  8.6*  --  8.6*  MG 2.3  --   --   --   --   --   --    < > = values in this interval not displayed.   Recent Labs    10/28/22 0000 11/18/22 0000  AST 16 16  ALT 19 11  ALKPHOS 58 76  ALBUMIN 2.7* 3.4*   Recent Labs    10/15/22 0112 10/28/22 0000 12/15/22 1907  WBC 14.3* 7.4 9.6  NEUTROABS  --  4,706.00  --   HGB 10.2* 9.2* 10.0*  HCT 31.3* 29* 31.7*  MCV 87.2  --  92.4  PLT 191 306 205   Lab Results  Component Value Date  TSH 6.30 (A) 01/06/2023   Lab Results  Component Value Date   HGBA1C 5.6 08/26/2022   Lab Results  Component Value Date   CHOL 150 12/09/2022   HDL 75 (A) 12/09/2022   LDLCALC 58 12/09/2022   TRIG 90 12/09/2022   CHOLHDL 2.5 04/07/2019    Significant Diagnostic Results in last 30 days:  No results found.  Assessment/Plan Recurrent falls High fall risk, multiple interventions in place to reduce falls.   Recurrent UTI Continues on trimethoprim daily for prevention, no signs of UTI at this time.    Rheumatoid arthritis (HCC) Stable disease, no increase in pain. Cont home methotrexate  and low dose prednisone     Depression Continues on zoloft  75 mg daily, would not adjust medication due to increase risk of worsening depression.   Essential hypertension Blood pressure well controlled, goal bp <140/90 Continue current medications and dietary modifications follow metabolic panel   Moderate dementia with mood disturbance, unspecified dementia type (HCC) Stable, no acute changes in cognitive or functional status, continue supportive care.   Anemia Chronic disease, follow up cbc every 6 months.   Hypothyroidism Continues on synthroid  Follow TSH   HLD (hyperlipidemia) Continues on Lipitor 20 mg daily  Follow lipids yearly      Navayah Sok K. Caro BODILY [facility] [phone]

## 2023-10-13 NOTE — Assessment & Plan Note (Signed)
 Continues on Lipitor 20 mg daily  Follow lipids yearly

## 2023-10-13 NOTE — Assessment & Plan Note (Signed)
 Continues on trimethoprim daily for prevention, no signs of UTI at this time.

## 2023-10-13 NOTE — Assessment & Plan Note (Signed)
 Stable disease, no increase in pain. Cont home methotrexate  and low dose prednisone 

## 2023-10-13 NOTE — Assessment & Plan Note (Signed)
 Stable, no acute changes in cognitive or functional status, continue supportive care.

## 2023-10-17 LAB — BASIC METABOLIC PANEL WITH GFR
BUN: 19 (ref 4–21)
CO2: 28 — AB (ref 13–22)
Chloride: 108 (ref 99–108)
Creatinine: 0.8 (ref 0.5–1.1)
Glucose: 72
Potassium: 3.9 meq/L (ref 3.5–5.1)
Sodium: 141 (ref 137–147)

## 2023-10-17 LAB — COMPREHENSIVE METABOLIC PANEL WITH GFR
Albumin: 3.6 (ref 3.5–5.0)
Calcium: 8.5 — AB (ref 8.7–10.7)
Globulin: 2.4
eGFR: 71

## 2023-10-17 LAB — HEPATIC FUNCTION PANEL
ALT: 14 U/L (ref 7–35)
AST: 18 (ref 13–35)
Alkaline Phosphatase: 101 (ref 25–125)
Bilirubin, Total: 0.6

## 2023-10-17 LAB — CBC: RBC: 3.21 — AB (ref 3.87–5.11)

## 2023-10-17 LAB — TSH: TSH: 4.67 (ref 0.41–5.90)

## 2023-10-17 LAB — LIPID PANEL
Cholesterol: 140 (ref 0–200)
HDL: 72 — AB (ref 35–70)
LDL Cholesterol: 51
Triglycerides: 87 (ref 40–160)

## 2023-10-17 LAB — CBC AND DIFFERENTIAL
HCT: 32 — AB (ref 36–46)
Hemoglobin: 10.3 — AB (ref 12.0–16.0)
Neutrophils Absolute: 3354
Platelets: 179 K/uL (ref 150–400)
WBC: 6.2

## 2023-11-16 ENCOUNTER — Non-Acute Institutional Stay (SKILLED_NURSING_FACILITY): Payer: Self-pay | Admitting: Student

## 2023-11-16 ENCOUNTER — Encounter: Payer: Self-pay | Admitting: Student

## 2023-11-16 DIAGNOSIS — F331 Major depressive disorder, recurrent, moderate: Secondary | ICD-10-CM

## 2023-11-16 NOTE — Progress Notes (Signed)
 Location:  Other [facility] [place].  Nursing Home Room Number: [facility] [id number] Place of Service:  SNF [phone]) Provider:  Abdul Fine, MD  Patient Care Team: Abdul Fine, MD as PCP - General [facility] Medicine)  Extended Emergency Contact Information Primary Emergency Contact: Lupton,Barbara  [place] Home Phone: [phone] Relation: Daughter Secondary Emergency Contact: Maple Bridgette Lenis Home Phone: [phone] Mobile Phone: [phone] Relation: Son  Code Status:  DNR Goals of care: Advanced Directive information    10/13/2023    9:48 AM  Advanced Directives  Does Patient Have a Medical Advance Directive? Yes  Type of Advance Directive Out of facility DNR (pink MOST or yellow form)  Does patient want to make changes to medical advance directive? No - Patient declined     Chief Complaint  Patient presents with   Mood Management    Mood Management     HPI:  Pt is a 89 y.o. female seen today for an acute visit for Mood Management  History of Present Illness The patient is a 89 year old who presents with mood disturbances after discontinuation of medication.  She discontinued her mood medication a couple of months ago. She expresses stress about her current living situation and uncertainty about her future, stating, 'I'm stressed because I'm here. And I don't know where I'll be if I'm not here.'  She acknowledges living in her current residence for over a year. She expresses a sense of acceptance about her current living situation, stating, 'I realize that I am in this place. There is no better place than where I am.'  Social History Living Situation: Resides in a care facility called [facility]. - Lydian is in a stage of life where she requires assistance with daily activities such as cooking, cleaning, bathing, and getting safely to the bathroom. She has been living in the care facility for over a year and is experiencing distress related to this  transition due to poor recollection of the change.   Past Medical History:  Diagnosis Date   Actinic keratosis    Arthritis    Basal cell carcinoma    Left chin, right nasolabial   Closed pelvic fracture (HCC) 12/16/2020   Closed right hip fracture (HCC) 11/30/2019   Collagen vascular disease (HCC)    RA   GERD 02/01/2007   Qualifier: Diagnosis of   By: Jimmy MD, Charlie Scarlet        Heartburn    Hip fracture (HCC) 11/30/2019   History of colonic polyps 02/01/2007   Qualifier: Diagnosis of   By: Jimmy MD, Charlie Scarlet     IMO SNOMED Dx Update Oct 2024     History of fracture of right hip 11/30/2019   Formatting of this note might be different from the original.  8/21; s/p THA     Hypertension    Squamous cell carcinoma of skin 05/06/2021   left hand, treated with [facility]   Past Surgical History:  Procedure Laterality Date   ABDOMINAL HYSTERECTOMY     BREAST CYST EXCISION     HIP ARTHROPLASTY Right 11/30/2019   Procedure: ARTHROPLASTY BIPOLAR HIP (HEMIARTHROPLASTY);  Surgeon: Edie Norleen PARAS, MD;  Location: [facility] ORS;  Service: Orthopedics;  Laterality: Right;   SACROPLASTY N/A 12/17/2020   Procedure: SACROPLASTY;  Surgeon: Kathlynn Sharper, MD;  Location: [facility] ORS;  Service: Orthopedics;  Laterality: N/A;   THORACIC AORTIC ENDOVASCULAR STENT GRAFT N/A 04/11/2019   Procedure: THORACIC AORTIC ENDOVASCULAR STENT GRAFT insertion;  Surgeon: Serene Gaile ORN,  MD;  Location: [facility] OR;  Service: Vascular;  Laterality: N/A;   ULTRASOUND GUIDANCE FOR VASCULAR ACCESS Right 04/11/2019   Procedure: Ultrasound Guidance For Vascular Access, right femoral artery;  Surgeon: Serene Gaile ORN, MD;  Location: [facility] OR;  Service: Vascular;  Laterality: Right;    Allergies  Allergen Reactions   Amoxicillin Other (See Comments)    Intolerant  Daughter and Son report patient tolerated course of Augmentin in January 2024   Codeine Nausea Only   Ibandronate Sodium  [Ibandronate] Other (See Comments)    Gi upset    Risedronate Other (See Comments)    Gi upset   Sulfa Antibiotics Hives    Outpatient Encounter Medications as of 11/16/2023  Medication Sig   acetaminophen (TYLENOL) 325 MG tablet Take 650 mg by mouth 2 (two) times daily.   acetaminophen (TYLENOL) 500 MG tablet Take 1,000 mg by mouth. Every 12 hours as needed.   amLODipine (NORVASC) 10 MG tablet Take 1 tablet (10 mg total) by mouth daily.   Artificial Tear Solution (SOOTHE XP OP) Apply 1 drop to eye 3 (three) times daily.   atorvastatin (LIPITOR) 20 MG tablet Take 20 mg by mouth daily.   Calcium Carbonate (CALCIUM 600 PO) Take 600 mg by mouth in the morning and at bedtime.   carvedilol (COREG) 12.5 MG tablet Take 1 tablet (12.5 mg total) by mouth 2 (two) times daily with a meal.   diclofenac Sodium (VOLTAREN) 1 % GEL Apply 4 g topically. Apply to both knees every day and evening shift.   folic acid (FOLVITE) 1 MG tablet Take 1 mg by mouth daily.   hydrALAZINE (APRESOLINE) 10 MG tablet Take 10 mg by mouth every 12 (twelve) hours as needed. For SBP greater than 140.   levothyroxine (SYNTHROID, LEVOTHROID) 75 MCG tablet Take 75 mcg by mouth at bedtime.   losartan (COZAAR) 100 MG tablet Take 1 tablet (100 mg total) by mouth daily.   methotrexate (RHEUMATREX) 2.5 MG tablet Take 15 mg by mouth every Friday.   Multiple Vitamins-Minerals (MULTIVITAMINS THER. W/MINERALS) TABS tablet Take 1 tablet by mouth daily.   predniSONE (DELTASONE) 2.5 MG tablet Take 2.5 mg by mouth daily.   senna (SENOKOT) 8.6 MG TABS tablet Take 1 tablet by mouth daily.   sertraline (ZOLOFT) 100 MG tablet Take 100 mg by mouth at bedtime.   sertraline (ZOLOFT) 50 MG tablet Take 75 mg by mouth daily.   trimethoprim (TRIMPEX) 100 MG tablet Take 100 mg by mouth daily.   Propylene Glycol (SYSTANE BALANCE OP) PRN order: Instill one drop in both eyes every 12 hours as needed for dry eyes. (Patient not taking: Reported on 11/16/2023)   No facility-administered encounter medications  on file as of 11/16/2023.    Review of Systems  Immunization History  Administered Date(s) Administered   Influenza Split 01/21/2014, 12/04/2015   Influenza, High Dose Seasonal PF 01/31/2017, 12/18/2018   Influenza-Unspecified 01/27/2015, 12/18/2018, 01/01/2020, 01/05/2021, 01/19/2022, 01/26/2023   Moderna Covid-19 Vaccine Bivalent Booster 23yrs & up 09/01/2021   Moderna Sars-Covid-2 Vaccination 04/23/2019, 05/21/2019, 02/19/2020, 07/11/2020, 01/29/2021   Pneumococcal Conjugate-13 03/15/2014   Pneumococcal Polysaccharide-23 04/05/2001, 08/13/2016   RSV,unspecified 01/19/2022   Td 07/04/2005   Td (Adult),5 Lf Tetanus Toxid, Preservative Free 02/15/2017   Tdap 07/27/2005, 02/15/2017   Zoster Recombinant(Shingrix) 12/29/2018, 06/10/2019   Zoster, Live 04/05/2006   Pertinent  Health Maintenance Due  Topic Date Due   INFLUENZA VACCINE  11/04/2023   DEXA SCAN  Discontinued  12/17/2020    2:42 PM 12/18/2020    2:00 AM 12/18/2020    1:00 PM 03/28/2022    6:10 AM 01/04/2023   12:42 PM  Fall Risk  Falls in the past year?     1  Was there an injury with Fall?     1  Fall Risk Category Calculator     3  (RETIRED) Patient Fall Risk Level High fall risk  High fall risk  High fall risk  High fall risk       Data saved with a previous flowsheet row definition   Functional Status Survey:    Vitals:   11/16/23 1225  BP: 124/65  Pulse: 61  Resp: 17  Temp: 97.9 F (36.6 C)  SpO2: 96%  Weight: 163 lb 12.8 oz (74.3 kg)  Height: 5' 6 (1.676 m)   Body mass index is 26.44 kg/m. Physical Exam Neurological:     Mental Status: She is alert.  Psychiatric:     Comments: depressed     Labs reviewed: Recent Labs    11/18/22 0000 12/09/22 0000 12/15/22 1907 10/17/23 0000  NA 143  --  137 141  K 3.3* 3.3* 3.8 3.9  CL 107  --  105 108  CO2 29*  --  22 28*  GLUCOSE  --   --  110*  --   BUN 10  --  18 19  CREATININE 0.6  --  0.56 0.8  CALCIUM  8.6*  --  8.6* 8.5*   Recent  Labs    11/18/22 0000 10/17/23 0000  AST 16 18  ALT 11 14  ALKPHOS 76 101  ALBUMIN 3.4* 3.6   Recent Labs    12/15/22 1907 10/17/23 0000  WBC 9.6 6.2  NEUTROABS  --  3,354.00  HGB 10.0* 10.3*  HCT 31.7* 32*  MCV 92.4  --   PLT 205 179   Lab Results  Component Value Date   TSH 4.67 10/17/2023   Lab Results  Component Value Date   HGBA1C 5.6 08/26/2022   Lab Results  Component Value Date   CHOL 140 10/17/2023   HDL 72 (A) 10/17/2023   LDLCALC 51 10/17/2023   TRIG 87 10/17/2023   CHOLHDL 2.5 04/07/2019    Significant Diagnostic Results in last 30 days:  No results found.  Assessment/Plan Depression Depression has worsened following the discontinuation of medication a couple of months ago, with increased crying and distress. Stress related to her current living situation and uncertainty about the future is contributing to her condition.Failed GDR from 08/2023.  - increase medication for mood management sertraline  100 mg nightly  Need for assistance with activities of daily living Requires assistance with activities of daily living, including cooking, cleaning, bathing, and safely getting to the bathroom. Residing in a care facility that provides necessary support and acknowledges the need for this level of care and the safety it provides. Frequent falls  however significant improvement since initiation of AI [employer] for fall prevention. Recent improvement associated with installation of this technology.   Family/ staff Communication: nursing  Labs/tests ordered:  none

## 2023-11-17 ENCOUNTER — Encounter: Payer: Self-pay | Admitting: Student

## 2023-12-25 ENCOUNTER — Emergency Department

## 2023-12-25 ENCOUNTER — Emergency Department
Admission: EM | Admit: 2023-12-25 | Discharge: 2023-12-25 | Disposition: A | Discharge status: Home / Self Care | Attending: Emergency Medicine | Admitting: Emergency Medicine

## 2023-12-25 ENCOUNTER — Encounter: Payer: Self-pay | Admitting: Emergency Medicine

## 2023-12-25 ENCOUNTER — Other Ambulatory Visit: Payer: Self-pay

## 2023-12-25 DIAGNOSIS — I10 Essential (primary) hypertension: Secondary | ICD-10-CM | POA: Insufficient documentation

## 2023-12-25 DIAGNOSIS — W01198A Fall on same level from slipping, tripping and stumbling with subsequent striking against other object, initial encounter: Secondary | ICD-10-CM | POA: Diagnosis not present

## 2023-12-25 DIAGNOSIS — S0003XA Contusion of scalp, initial encounter: Secondary | ICD-10-CM | POA: Insufficient documentation

## 2023-12-25 DIAGNOSIS — E039 Hypothyroidism, unspecified: Secondary | ICD-10-CM | POA: Diagnosis not present

## 2023-12-25 DIAGNOSIS — W19XXXA Unspecified fall, initial encounter: Secondary | ICD-10-CM

## 2023-12-25 MED ORDER — ACETAMINOPHEN 500 MG PO TABS
500.0000 mg | ORAL_TABLET | Freq: Once | ORAL | Status: DC
  Filled 2023-12-25: qty 1

## 2023-12-25 NOTE — Discharge Instructions (Signed)
 Follow-up with your primary care provider if any continued problems or concerns.  Tylenol  if needed.

## 2023-12-25 NOTE — ED Provider Notes (Signed)
 [facility] Provider Note    Event Date/Time   First MD Initiated Contact with Patient 12/25/23 [phone]     (approximate)   History   Fall   HPI  Myrikal Messmer is a 89 y.o. female presents to the ED via EMS from [facility].  EMS reports that staff's reports that she was trying to put her pants on while in the bathroom and slid down onto the floor.  Patient reports that she did hit her head but states that she has not any pain and is very angry that she is in the emergency department as she did not want to go.  Patient has a history of hypertension, hypothyroidism, rheumatoid arthritis, thoracic ascending aortic aneurysm, depression, history of pelvic fractures, moderate dementia with mood disturbance.     Physical Exam   Triage Vital Signs: ED Triage Vitals  Encounter Vitals Group     BP 12/25/23 0940 (!) 160/68     Girls Systolic BP Percentile --      Girls Diastolic BP Percentile --      Boys Systolic BP Percentile --      Boys Diastolic BP Percentile --      Pulse Rate 12/25/23 0940 60     Resp 12/25/23 0940 18     Temp 12/25/23 0940 97.8 F (36.6 C)     Temp Source 12/25/23 0940 Oral     SpO2 12/25/23 0940 100 %     Weight 12/25/23 0941 165 lb 5.5 oz (75 kg)     Height 12/25/23 0941 5' 6 (1.676 m)     Head Circumference --      Peak Flow --      Pain Score 12/25/23 0941 0     Pain Loc --      Pain Education --      Exclude from Growth Chart --     Most recent vital signs: Vitals:   12/25/23 0940  BP: (!) 160/68  Pulse: 60  Resp: 18  Temp: 97.8 F (36.6 C)  SpO2: 100%     General: Awake, no distress.  Alert, talkative. CV:  Good peripheral perfusion.  Heart regular rate and rhythm. Resp:  Normal effort.  Lungs are clear bilaterally. Abd:  No distention.  Soft, nontender, bowel sounds normoactive x 4 quadrants. Other:  PERRLA, EOMI's, cranial nerves II through XII grossly intact, speech is normal.  Patient is able move upper  extremities without any difficulty.  Denies pain with palpation of cervical, thoracic and lumbar spine.  Patient is able to move lower extremities without any difficulty and is able to stand and bear weight without pain.  No pain with compression of the pelvis, no lower extremity shortening or rotation noted.  Skin is intact.  No discoloration present.  No facial trauma present.   ED Results / Procedures / Treatments   Labs (all labs ordered are listed, but only abnormal results are displayed) Labs Reviewed - No data to display    RADIOLOGY CT head and cervical spine per radiology is negative for acute intracranial changes.  Patient does have mild to moderate cerebral atrophy reported stable by radiology.  Chronic small vessel disease.  CT cervical spine per radiology is negative for cervical fracture or subluxation.    PROCEDURES:  Critical Care performed:   Procedures   MEDICATIONS ORDERED IN ED: Medications - No data to display   IMPRESSION / MDM / ASSESSMENT AND PLAN / ED COURSE  I  reviewed the triage vital signs and the nursing notes.   Differential diagnosis includes, but is not limited to, head injury, cervical injury, mechanical fall witnessed by staff members.  89 year old female presents to the ED via EMS after patient slid into the floor while trying to put her pants on.  This was noted by staff at [facility] facility.  Patient voices that she is upset that she is here in the emergency department and did not want to go.  She was noted to be standing when I entered the room to examine her as she had scooted off the bed.  She denies any pain.  I explained to her that we would at least get a CT of her head and cervical spine since she did fall and she was agreeable to this.  Daughter was present prior to discharge and is aware that there is no acute injury to her head or cervical spine.  Patient continues to deny any pain and on second exam there was still no  tenderness, deformity, skin discoloration noted.  Patient was encouraged to use her walker that she has at the facility.      Patient's presentation is most consistent with acute presentation with potential threat to life or bodily function.  FINAL CLINICAL IMPRESSION(S) / ED DIAGNOSES   Final diagnoses:  Fall, initial encounter  Contusion of scalp, initial encounter     Rx / DC Orders   ED Discharge Orders     None        Note:  This document was prepared using Dragon voice recognition software and may include unintentional dictation errors.   Saunders Shona CROME, PA-C 12/25/23 1410    Willo Dunnings, MD 12/25/23 [phone]

## 2023-12-25 NOTE — ED Triage Notes (Signed)
 Presents vis EMS  from [facility]   Per ems she was in the bathroom , trying to pull up her pants  Slid down to the floor  Staff was present  Hit her head  Pt denies any pain

## 2023-12-26 ENCOUNTER — Non-Acute Institutional Stay (SKILLED_NURSING_FACILITY): Payer: Self-pay | Admitting: Adult Health

## 2023-12-26 ENCOUNTER — Encounter: Payer: Self-pay | Admitting: Adult Health

## 2023-12-26 DIAGNOSIS — M069 Rheumatoid arthritis, unspecified: Secondary | ICD-10-CM

## 2023-12-26 DIAGNOSIS — E039 Hypothyroidism, unspecified: Secondary | ICD-10-CM | POA: Diagnosis not present

## 2023-12-26 DIAGNOSIS — S0003XS Contusion of scalp, sequela: Secondary | ICD-10-CM

## 2023-12-26 DIAGNOSIS — I10 Essential (primary) hypertension: Secondary | ICD-10-CM | POA: Diagnosis not present

## 2023-12-26 NOTE — Progress Notes (Signed)
 Location:  Other [place].  Nursing Home Room Number: [facility] [id number] Place of Service:  SNF (31) Provider:  Medina-Vargas, Elanda Garmany, DNP, FNP-BC  Patient Care Team: Abdul Fine, MD as PCP - General [facility])  Extended Emergency Contact Information Primary Emergency Contact: [facility]  [place] Home Phone: [phone] Relation: Daughter Secondary Emergency Contact: Maple Bridgette Lenis Home Phone: [phone] Mobile Phone: [phone] Relation: Son  Code Status:  DNR  Goals of care: Advanced Directive information    12/31/2023    3:29 PM  Advanced Directives  Does Patient Have a Medical Advance Directive? Yes  Type of Advance Directive Out of facility DNR (pink MOST or yellow form)  Does patient want to make changes to medical advance directive? No - Patient declined  Would patient like information on creating a medical advance directive? No - Patient declined  Pre-existing out of facility DNR order (yellow form or pink MOST form) Pink Most/Yellow Form available - Physician notified to receive inpatient order     Chief Complaint  Patient presents with   Hospitalization Follow-up    ER Follow up    HPI:  Pt is a 89 y.o. female seen today for ER Follow up. He is a resident of [facility]. She had a fall at SNF while in the bathroom trying to put her pants on and slid down the floor on 12/24/13. She stated that she hit her head and was sent to ER.  CT head without contrast showed no acute intracranial abnormality.  CT cervical spine showed no acute cervical fracture or subluxation. She was discharged back to SNF with no new orders.  She was seen in the room, denies pain.   Past Medical History:  Diagnosis Date   Actinic keratosis    Arthritis    Basal cell carcinoma    Left chin, right nasolabial   Closed pelvic fracture (HCC) 12/16/2020   Closed right hip fracture (HCC) 11/30/2019   Collagen vascular disease     RA   GERD 02/01/2007   Qualifier: Diagnosis of   By: Jimmy MD, Charlie Scarlet        Heartburn    Hip fracture (HCC) 11/30/2019   History of colonic polyps 02/01/2007   Qualifier: Diagnosis of   By: Jimmy MD, Charlie Scarlet     IMO SNOMED Dx Update Oct 2024     History of fracture of right hip 11/30/2019   Formatting of this note might be different from the original.  8/21; s/p THA     Hypertension    Squamous cell carcinoma of skin 05/06/2021   left hand, treated with [facility]   Past Surgical History:  Procedure Laterality Date   ABDOMINAL HYSTERECTOMY     BREAST CYST EXCISION     HIP ARTHROPLASTY Right 11/30/2019   Procedure: ARTHROPLASTY BIPOLAR HIP (HEMIARTHROPLASTY);  Surgeon: Edie Norleen PARAS, MD;  Location: [facility] ORS;  Service: Orthopedics;  Laterality: Right;   SACROPLASTY N/A 12/17/2020   Procedure: SACROPLASTY;  Surgeon: Kathlynn Sharper, MD;  Location: [facility] ORS;  Service: Orthopedics;  Laterality: N/A;   THORACIC AORTIC ENDOVASCULAR STENT GRAFT N/A 04/11/2019   Procedure: THORACIC AORTIC ENDOVASCULAR STENT GRAFT insertion;  Surgeon: Serene Gaile ORN, MD;  Location: [facility] OR;  Service: Vascular;  Laterality: N/A;   ULTRASOUND GUIDANCE FOR VASCULAR ACCESS Right 04/11/2019   Procedure: Ultrasound Guidance For Vascular Access, right femoral artery;  Surgeon: Serene Gaile ORN, MD;  Location: [facility] OR;  Service: Vascular;  Laterality: Right;    Allergies  Allergen Reactions   Amoxicillin  Other (See Comments)    Intolerant  Daughter and Son report patient tolerated course of Augmentin  in January 2024   Codeine Nausea Only   Ibandronate Sodium  [Ibandronate] Other (See Comments)    Gi upset   Risedronate Other (See Comments)    Gi upset   Sulfa Antibiotics Hives    Outpatient Encounter Medications as of 12/26/2023  Medication Sig   acetaminophen  (TYLENOL ) 325 MG tablet Take 650 mg by mouth 2 (two) times daily.   acetaminophen  (TYLENOL ) 500 MG tablet Take 1,000 mg by mouth. Every 12 hours as  needed.   amLODipine  (NORVASC ) 10 MG tablet Take 1 tablet (10 mg total) by mouth daily.   Artificial Tear Solution (SOOTHE XP OP) Apply 1 drop to eye 3 (three) times daily.   atorvastatin  (LIPITOR) 20 MG tablet Take 20 mg by mouth daily.   Calcium  Carbonate (CALCIUM  600 PO) Take 600 mg by mouth in the morning and at bedtime.   carvedilol  (COREG ) 12.5 MG tablet Take 1 tablet (12.5 mg total) by mouth 2 (two) times daily with a meal.   diclofenac Sodium (VOLTAREN) 1 % GEL Apply 4 g topically. Apply to both knees every day and evening shift.   folic acid  (FOLVITE ) 1 MG tablet Take 1 mg by mouth daily.   furosemide (LASIX) 20 MG tablet Take 20 mg by mouth as needed. Take for 5lb weight gain in 1 week.   hydrALAZINE  (APRESOLINE ) 10 MG tablet Take 10 mg by mouth every 12 (twelve) hours as needed. For SBP greater than 140.   levothyroxine  (SYNTHROID , LEVOTHROID) 75 MCG tablet Take 75 mcg by mouth at bedtime.   lidocaine  (SALONPAS PAIN RELIEVING) 4 % Place 1 patch onto the skin daily as needed.   losartan  (COZAAR ) 100 MG tablet Take 1 tablet (100 mg total) by mouth daily.   methotrexate  (RHEUMATREX) 2.5 MG tablet Take 15 mg by mouth every Friday.   Multiple Vitamins-Minerals (MULTIVITAMINS THER. W/MINERALS) TABS tablet Take 1 tablet by mouth daily.   potassium chloride  (KLOR-CON ) 10 MEQ tablet Take 10 mEq by mouth as needed.   predniSONE  (DELTASONE ) 2.5 MG tablet Take 2.5 mg by mouth daily.   senna (SENOKOT) 8.6 MG TABS tablet Take 1 tablet by mouth daily.   sertraline  (ZOLOFT ) 100 MG tablet Take 100 mg by mouth at bedtime.   trimethoprim (TRIMPEX) 100 MG tablet Take 100 mg by mouth daily.   Propylene Glycol (SYSTANE BALANCE OP) PRN order: Instill one drop in both eyes every 12 hours as needed for dry eyes. (Patient not taking: Reported on 12/26/2023)   sertraline  (ZOLOFT ) 50 MG tablet Take 75 mg by mouth daily. (Patient not taking: Reported on 12/26/2023)   No facility-administered encounter medications  on file as of 12/26/2023.    Review of Systems  Constitutional:  Negative for appetite change, chills, fatigue and fever.  HENT:  Negative for congestion, hearing loss, rhinorrhea and sore throat.   Eyes: Negative.   Respiratory:  Negative for cough, shortness of breath and wheezing.   Cardiovascular:  Negative for chest pain, palpitations and leg swelling.  Gastrointestinal:  Negative for abdominal pain, constipation, diarrhea, nausea and vomiting.  Genitourinary:  Negative for dysuria.  Musculoskeletal:  Negative for arthralgias, back pain and myalgias.  Skin:  Negative for color change, rash and wound.  Neurological:  Negative for dizziness, weakness and headaches.  Psychiatric/Behavioral:  Negative for behavioral problems. The patient is not  nervous/anxious.        Immunization History  Administered Date(s) Administered   INFLUENZA, HIGH DOSE SEASONAL PF 01/31/2017, 12/18/2018   Influenza Split 01/21/2014, 12/04/2015   Influenza-Unspecified 01/27/2015, 12/18/2018, 01/01/2020, 01/05/2021, 01/19/2022, 01/26/2023   Moderna Covid-19 Vaccine Bivalent Booster 60yrs & up 09/01/2021   Moderna Sars-Covid-2 Vaccination 04/23/2019, 05/21/2019, 02/19/2020, 07/11/2020, 01/29/2021   Pneumococcal Conjugate-13 03/15/2014   Pneumococcal Polysaccharide-23 04/05/2001, 08/13/2016   RSV,unspecified 01/19/2022   Td 07/04/2005   Td (Adult),5 Lf Tetanus Toxid, Preservative Free 02/15/2017   Tdap 07/27/2005, 02/15/2017   Zoster Recombinant(Shingrix) 12/29/2018, 06/10/2019   Zoster, Live 04/05/2006   Pertinent  Health Maintenance Due  Topic Date Due   Influenza Vaccine  11/04/2023   DEXA SCAN  Discontinued      12/17/2020    2:42 PM 12/18/2020    2:00 AM 12/18/2020    1:00 PM 03/28/2022    6:10 AM 01/04/2023   12:42 PM  Fall Risk  Falls in the past year?     1  Was there an injury with Fall?     1  Fall Risk Category Calculator     3  (RETIRED) Patient Fall Risk Level High fall risk  High  fall risk  High fall risk  High fall risk       Data saved with a previous flowsheet row definition     Vitals:   12/26/23 1412  BP: 115/62  Pulse: (!) 59  Resp: 14  Temp: (!) 96.4 F (35.8 C)  SpO2: 95%  Weight: 162 lb 12.8 oz (73.8 kg)  Height: 5' 6 (1.676 m)   Body mass index is 26.28 kg/m.  Physical Exam Constitutional:      General: She is not in acute distress.    Appearance: Normal appearance.  HENT:     Head: Normocephalic and atraumatic.     Nose: Nose normal.     Mouth/Throat:     Mouth: Mucous membranes are moist.  Eyes:     Conjunctiva/sclera: Conjunctivae normal.  Cardiovascular:     Rate and Rhythm: Normal rate and regular rhythm.  Pulmonary:     Effort: Pulmonary effort is normal.     Breath sounds: Normal breath sounds.  Abdominal:     General: Bowel sounds are normal.     Palpations: Abdomen is soft.  Musculoskeletal:        General: Normal range of motion.     Cervical back: Normal range of motion.  Skin:    General: Skin is warm and dry.  Neurological:     Mental Status: She is alert.     Comments: Alert to self and place, disoriented to time.  Psychiatric:        Mood and Affect: Mood normal.        Behavior: Behavior normal.        Labs reviewed: Recent Labs    10/17/23 0000  NA 141  K 3.9  CL 108  CO2 28*  BUN 19  CREATININE 0.8  CALCIUM  8.5*   Recent Labs    10/17/23 0000  AST 18  ALT 14  ALKPHOS 101  ALBUMIN 3.6   Recent Labs    10/17/23 0000  WBC 6.2  NEUTROABS 3,354.00  HGB 10.3*  HCT 32*  PLT 179   Lab Results  Component Value Date   TSH 4.67 10/17/2023   Lab Results  Component Value Date   HGBA1C 5.6 08/26/2022   Lab Results  Component Value Date  CHOL 140 10/17/2023   HDL 72 (A) 10/17/2023   LDLCALC 51 10/17/2023   TRIG 87 10/17/2023   CHOLHDL 2.5 04/07/2019    Significant Diagnostic Results in last 30 days:  CT Cervical Spine Wo Contrast Result Date: 12/31/2023 CLINICAL DATA:  Facial  trauma, blunt.  Fall from bed. EXAM: CT CERVICAL SPINE WITHOUT CONTRAST TECHNIQUE: Multidetector CT imaging of the cervical spine was performed without intravenous contrast. Multiplanar CT image reconstructions were also generated. RADIATION DOSE REDUCTION: This exam was performed according to the departmental dose-optimization program which includes automated exposure control, adjustment of the mA and/or kV according to patient size and/or use of iterative reconstruction technique. COMPARISON:  CT cervical spine 12/25/2023 and 12/15/2022. Chest CTA 11/19/2022. FINDINGS: Alignment: Mild cervicothoracic scoliosis and exaggerated thoracic kyphosis. No focal angulation or listhesis. Skull base and vertebrae: No evidence of acute cervical spine fracture or traumatic subluxation. There is a superior endplate compression deformity at T4 resulting in approximately 15% loss of vertebral body height and endplate sclerosis. This was not included on previous cervical spine CTs but appears new from chest CTA 11/19/2022. No osseous retropulsion demonstrated. Soft tissues and spinal canal: No prevertebral fluid or swelling. No visible canal hematoma. Disc levels: Mild multilevel cervical spondylosis with disc space narrowing, uncinate spurring and facet hypertrophy. No evidence of large disc herniation or high-grade spinal stenosis. Scattered predominantly mild foraminal narrowing. Upper chest: Atherosclerosis of the aorta and great vessels status post aortic stent grafting. 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 Emphysematous changes in both lung apices with stable left apical scarring. Other: None. IMPRESSION: 1. No evidence of acute cervical spine fracture, traumatic subluxation or static signs of instability. 2. Mild superior endplate compression deformity at T4, new from chest CTA 11/19/2022 and possibly acute. No osseous retropulsion. 3. Mild cervical spondylosis. 4. Dilated thoracic esophagus with  retained ingested material. 5. Aortic Atherosclerosis (ICD10-I70.0) and Emphysema (ICD10-J43.9). Electronically Signed   By: Elsie Perone M.D.   On: 12/31/2023 16:34   CT Head Wo Contrast Result Date: 12/31/2023 EXAM: CT HEAD WITHOUT CONTRAST 12/31/2023 04:13:19 PM TECHNIQUE: CT of the head was performed without the administration of intravenous contrast. Automated exposure control, iterative reconstruction, and/or weight based adjustment of the mA/kV was utilized to reduce the radiation dose to as low as reasonably achievable. COMPARISON: None available. CLINICAL HISTORY: Facial trauma, blunt. Clemens out of bed. FINDINGS: BRAIN AND VENTRICLES: No acute hemorrhage. No evidence of acute infarct. No hydrocephalus. No extra-axial collection. No mass effect or midline shift. Moderate atrophy and white matter changes are stable. Atherosclerotic calcifications are present in the cavernous carotid arteries bilaterally and at the dural margin of both vertebral arteries. No hyperdense vessel is present. ORBITS: Bilateral lens replacements are noted. The globes and orbits are otherwise within normal limits. SINUSES: Mild mucosal thickening is present in the maxillary sinuses bilaterally. SOFT TISSUES AND SKULL: A left frontal and temporal scalp hematoma is present. A chronic 5 mm metallic fragment in the superior posterior aspect of the hematoma is chronic and seen on prior studies. No skull fracture. IMPRESSION: 1. No acute intracranial abnormality. 2. Left frontal and temporal scalp hematoma without underlying fracture. Electronically signed by: Lonni Necessary MD 12/31/2023 04:32 PM EDT RP Workstation: [id number]   CT Head Wo Contrast Result Date: 12/25/2023 CLINICAL DATA:  Fall in bathroom.  Hit head.  Head and neck pain. EXAM: CT HEAD WITHOUT CONTRAST CT CERVICAL SPINE WITHOUT CONTRAST TECHNIQUE: Multidetector CT imaging of the head and cervical spine was performed  following the standard protocol without  intravenous contrast. Multiplanar CT image reconstructions of the cervical spine were also generated. RADIATION DOSE REDUCTION: This exam was performed according to the departmental dose-optimization program which includes automated exposure control, adjustment of the mA and/or kV according to patient size and/or use of iterative reconstruction technique. COMPARISON:  12/15/2022 FINDINGS: CT HEAD FINDINGS Brain: No evidence of intracranial hemorrhage, acute infarction, hydrocephalus, extra-axial collection, or mass lesion/mass effect. Stable mild-to-moderate diffuse cerebral atrophy and chronic small vessel disease. Vascular:  No hyperdense vessel or other acute findings. Skull: No evidence of fracture or other significant bone abnormality. Sinuses/Orbits:  No acute findings. Other: None. CT CERVICAL SPINE FINDINGS Alignment: Normal. Skull base and vertebrae: No acute fracture. No primary bone lesion or focal pathologic process. Soft tissues and spinal canal: No prevertebral fluid or swelling. No visible canal hematoma. Disc levels: Mild degenerative disc disease seen from levels of C3-C7. Left-sided facet DJD is seen from levels of C4-T1. Upper chest: No acute findings. Other: None. IMPRESSION: No acute intracranial abnormality. Stable cerebral atrophy and chronic small vessel disease. No evidence of acute cervical spine fracture or subluxation. Degenerative spondylosis, as described above. Electronically Signed   By: Norleen DELENA Kil M.D.   On: 12/25/2023 10:52   CT Cervical Spine Wo Contrast Result Date: 12/25/2023 CLINICAL DATA:  Fall in bathroom.  Hit head.  Head and neck pain. EXAM: CT HEAD WITHOUT CONTRAST CT CERVICAL SPINE WITHOUT CONTRAST TECHNIQUE: Multidetector CT imaging of the head and cervical spine was performed following the standard protocol without intravenous contrast. Multiplanar CT image reconstructions of the cervical spine were also generated. RADIATION DOSE REDUCTION: This exam was performed  according to the departmental dose-optimization program which includes automated exposure control, adjustment of the mA and/or kV according to patient size and/or use of iterative reconstruction technique. COMPARISON:  12/15/2022 FINDINGS: CT HEAD FINDINGS Brain: No evidence of intracranial hemorrhage, acute infarction, hydrocephalus, extra-axial collection, or mass lesion/mass effect. Stable mild-to-moderate diffuse cerebral atrophy and chronic small vessel disease. Vascular:  No hyperdense vessel or other acute findings. Skull: No evidence of fracture or other significant bone abnormality. Sinuses/Orbits:  No acute findings. Other: None. CT CERVICAL SPINE FINDINGS Alignment: Normal. Skull base and vertebrae: No acute fracture. No primary bone lesion or focal pathologic process. Soft tissues and spinal canal: No prevertebral fluid or swelling. No visible canal hematoma. Disc levels: Mild degenerative disc disease seen from levels of C3-C7. Left-sided facet DJD is seen from levels of C4-T1. Upper chest: No acute findings. Other: None. IMPRESSION: No acute intracranial abnormality. Stable cerebral atrophy and chronic small vessel disease. No evidence of acute cervical spine fracture or subluxation. Degenerative spondylosis, as described above. Electronically Signed   By: Norleen DELENA Kil M.D.   On: 12/25/2023 10:52    Assessment/Plan  1. Contusion of scalp, sequela (Primary) -  S/P fall on 12/25/23 -  was evaluated in ER with CT head no acute intracranial abnormality and CT cervical spine no acute cervical fracture or subluxation -  fall precautions  2. Essential hypertension -  BP stable -   Continue hydralazine  10 mg every 12 hours as needed - Continue amlodipine  10 mg daily   -   Continue losartan  100 mg daily  3. Hypothyroidism, unspecified type Lab Results  Component Value Date   TSH 4.67 10/17/2023    -   Continue levothyroxine  75 mcg daily  4. Rheumatoid arthritis, involving unspecified site,  unspecified whether rheumatoid factor present (HCC) -   Continue methotrexate   2.5 mg take 6 tabs  every Frdays -  continue Prednisone  2.5 mg daily    Family/ staff Communication: Discussed plan of care with resident and charge nurse.  Labs/tests ordered: None    Buna Cuppett Medina-Vargas, DNP, MSN, FNP-BC [facility] [phone] (Monday-Friday 8:00 a.m. - 5:00 p.m.) [phone] (after hours)

## 2023-12-31 ENCOUNTER — Emergency Department
Admission: EM | Admit: 2023-12-31 | Discharge: 2023-12-31 | Disposition: A | Discharge status: Home / Self Care | Source: Skilled Nursing Facility | Attending: Emergency Medicine | Admitting: Emergency Medicine

## 2023-12-31 ENCOUNTER — Emergency Department

## 2023-12-31 ENCOUNTER — Other Ambulatory Visit

## 2023-12-31 DIAGNOSIS — F0390 Unspecified dementia without behavioral disturbance: Secondary | ICD-10-CM | POA: Diagnosis not present

## 2023-12-31 DIAGNOSIS — S0990XA Unspecified injury of head, initial encounter: Secondary | ICD-10-CM | POA: Diagnosis present

## 2023-12-31 DIAGNOSIS — S22040A Wedge compression fracture of fourth thoracic vertebra, initial encounter for closed fracture: Secondary | ICD-10-CM

## 2023-12-31 DIAGNOSIS — W19XXXA Unspecified fall, initial encounter: Secondary | ICD-10-CM

## 2023-12-31 DIAGNOSIS — E039 Hypothyroidism, unspecified: Secondary | ICD-10-CM | POA: Insufficient documentation

## 2023-12-31 DIAGNOSIS — S0003XA Contusion of scalp, initial encounter: Secondary | ICD-10-CM | POA: Diagnosis not present

## 2023-12-31 DIAGNOSIS — W1839XA Other fall on same level, initial encounter: Secondary | ICD-10-CM | POA: Diagnosis not present

## 2023-12-31 LAB — CBG MONITORING, ED: Glucose-Capillary: 116 mg/dL — ABNORMAL HIGH (ref 70–99)

## 2023-12-31 MED ORDER — ACETAMINOPHEN 500 MG PO TABS
1000.0000 mg | ORAL_TABLET | ORAL | Status: AC
Start: 2023-12-31 — End: 2023-12-31
  Administered 2023-12-31: 1000 mg via ORAL
  Filled 2023-12-31: qty 2

## 2023-12-31 NOTE — ED Notes (Signed)
 ED Provider at bedside.

## 2023-12-31 NOTE — ED Notes (Signed)
 Pt headed to CT

## 2023-12-31 NOTE — Discharge Instructions (Signed)
You have been seen in the Emergency Department (ED) today for a fall.  ° °Please follow up with your doctor regarding today's Emergency Department (ED) visit and your recent fall.   ° °Return to the ED if you have any headache, confusion, slurred speech, weakness/numbness of any arm or leg, or any increased pain. ° °

## 2023-12-31 NOTE — ED Triage Notes (Signed)
 Per EMS, Pt, from [facility], presents after falling out of head.  Pt noted to have a hematoma on L forehead.  Pt c/o R thumb pain as well. Hx of dementia.    No LOC or thinners.  Pupils are even.

## 2023-12-31 NOTE — ED Notes (Signed)
 Pt ambulated with walker with steady gate. Pt returned to bed without incident.

## 2023-12-31 NOTE — ED Provider Notes (Signed)
 [facility] Provider Note    Event Date/Time   First MD Initiated Contact with Patient 12/31/23 1542     (approximate)   History   Fall and Head Injury   HPI  Gloria [name] is a 89 y.o. female   history of rheumatologic disease rheumatoid arthritis, hypothyroidism, history of frequent falls, currently residing in skilled nursing facility.  EM caveat: Patient with dementia able to orient to self and roughly to the age of 32-95, but not able to orient to her current living situation.  She is very pleasant in no distress  Patient reports that she thinks she fell.  She is sore over the left forehead.  Denies any other injury.  EMS reported that she had complained of thumb being sore but now she shows me her hands and reports they feel fine demonstrates motion of albeit is limited due to what appears significant rheumatologic disease but no pain or discomfort  No chest pain no trouble breathing no abdominal pain no pain anywhere else no hip pain arm pain etc.  Denies neck pain      Physical Exam   Triage Vital Signs: ED Triage Vitals  Encounter Vitals Group     BP 12/31/23 1527 (!) 155/81     Girls Systolic BP Percentile --      Girls Diastolic BP Percentile --      Boys Systolic BP Percentile --      Boys Diastolic BP Percentile --      Pulse Rate 12/31/23 1527 65     Resp 12/31/23 1527 16     Temp 12/31/23 1527 98 F (36.7 C)     Temp Source 12/31/23 1527 Oral     SpO2 12/31/23 1525 97 %     Weight 12/31/23 1528 162 lb (73.5 kg)     Height 12/31/23 1528 5' 6 (1.676 m)     Head Circumference --      Peak Flow --      Pain Score 12/31/23 1546 5     Pain Loc --      Pain Education --      Exclude from Growth Chart --     Most recent vital signs: Vitals:   12/31/23 1527 12/31/23 1546  BP: (!) 155/81   Pulse: 65   Resp: 16   Temp: 98 F (36.7 C)   SpO2: 96% 100%     General: Awake, no distress.  Normocephalic atraumatic subfloor  exception of about a 3 x 3 cm mild to moderate hematoma over the left frontal scalp with overlying abrasion but no laceration or bleeding CV:  Good peripheral perfusion.  Normal rate but moderate systolic ejection murmur Resp:  Normal effort.  Clear bilateral Abd:  No distention.  Soft nontender Other:  Moves all extremities well without deficit.  She has significant chronic rheumatologic findings in the hands but no acute pain deformity or discomfort through range of motion of hands elbows shoulders knees hips.  No pain with axial loading of the hips  No tenderness to palpation along the cervical thoracic or lumbar spine.  Moves extremities well.  No neurologic deficits.  Equal ocular movements.  Tracks examiner well.  Oriented  Normal mentation per daughter  ED Results / Procedures / Treatments   Labs (all labs ordered are listed, but only abnormal results are displayed) Labs Reviewed  CBG MONITORING, ED - Abnormal; Notable for the following components:      Result Value  Glucose-Capillary 116 (*)    All other components within normal limits     EKG  Inter by me at 1630 heart rate 65 QRS 100 QTc 450, normal sinus rhythm no evidence ischemia   RADIOLOGY  CT Cervical Spine Wo Contrast Result Date: 12/31/2023 CLINICAL DATA:  Facial trauma, blunt.  Fall from bed. EXAM: CT CERVICAL SPINE WITHOUT CONTRAST TECHNIQUE: Multidetector CT imaging of the cervical spine was performed without intravenous contrast. Multiplanar CT image reconstructions were also generated. RADIATION DOSE REDUCTION: This exam was performed according to the departmental dose-optimization program which includes automated exposure control, adjustment of the mA and/or kV according to patient size and/or use of iterative reconstruction technique. COMPARISON:  CT cervical spine 12/25/2023 and 12/15/2022. Chest CTA 11/19/2022. FINDINGS: Alignment: Mild cervicothoracic scoliosis and exaggerated thoracic kyphosis. No focal  angulation or listhesis. Skull base and vertebrae: No evidence of acute cervical spine fracture or traumatic subluxation. There is a superior endplate compression deformity at T4 resulting in approximately 15% loss of vertebral body height and endplate sclerosis. This was not included on previous cervical spine CTs but appears new from chest CTA 11/19/2022. No osseous retropulsion demonstrated. Soft tissues and spinal canal: No prevertebral fluid or swelling. No visible canal hematoma. Disc levels: Mild multilevel cervical spondylosis with disc space narrowing, uncinate spurring and facet hypertrophy. No evidence of large disc herniation or high-grade spinal stenosis. Scattered predominantly mild foraminal narrowing. Upper chest: Atherosclerosis of the aorta and great vessels status post aortic stent grafting. 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 Emphysematous changes in both lung apices with stable left apical scarring. Other: None. IMPRESSION: 1. No evidence of acute cervical spine fracture, traumatic subluxation or static signs of instability. 2. Mild superior endplate compression deformity at T4, new from chest CTA 11/19/2022 and possibly acute. No osseous retropulsion. 3. Mild cervical spondylosis. 4. 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 5. Aortic Atherosclerosis (ICD10-I70.0) and Emphysema (ICD10-J43.9). Electronically Signed   By: Elsie Perone M.D.   On: 12/31/2023 16:34   CT Head Wo Contrast Result Date: 12/31/2023 EXAM: CT HEAD WITHOUT CONTRAST 12/31/2023 04:13:19 PM TECHNIQUE: CT of the head was performed without the administration of intravenous contrast. Automated exposure control, iterative reconstruction, and/or weight based adjustment of the mA/kV was utilized to reduce the radiation dose to as low as reasonably achievable. COMPARISON: None available. CLINICAL HISTORY: Facial trauma, blunt. Clemens out of bed. FINDINGS: BRAIN AND VENTRICLES: No acute  hemorrhage. No evidence of acute infarct. No hydrocephalus. No extra-axial collection. No mass effect or midline shift. Moderate atrophy and white matter changes are stable. Atherosclerotic calcifications are present in the cavernous carotid arteries bilaterally and at the dural margin of both vertebral arteries. No hyperdense vessel is present. ORBITS: Bilateral lens replacements are noted. The globes and orbits are otherwise within normal limits. SINUSES: Mild mucosal thickening is present in the maxillary sinuses bilaterally. SOFT TISSUES AND SKULL: A left frontal and temporal scalp hematoma is present. A chronic 5 mm metallic fragment in the superior posterior aspect of the hematoma is chronic and seen on prior studies. No skull fracture. IMPRESSION: 1. No acute intracranial abnormality. 2. Left frontal and temporal scalp hematoma without underlying fracture. Electronically signed by: Lonni Necessary MD 12/31/2023 04:32 PM EDT RP Workstation: [id number]     Also reviewed cervical spine images with daughter at the bedside.  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  Appears to have a new compression deformity but previous cervical spine imaging did not fully encompassed T4 making it hard to know if this is truly acute or not.  Based on her lack of neck pain no I would favor it to be subacute   PROCEDURES:  Critical Care performed: No  Procedures   MEDICATIONS ORDERED IN ED: Medications  acetaminophen  (TYLENOL ) tablet 1,000 mg (1,000 mg Oral Given 12/31/23 1627)     IMPRESSION / MDM / ASSESSMENT AND PLAN / ED COURSE  I reviewed the triage vital signs and the nursing notes.                              Differential diagnosis includes, but is not limited to, fall, reportedly fell out of bed hitting her head.  She has a history of multiple frequent falls.  No reported recent preceding  illness other than preceding fall where she was seen in the ER roughly a week ago.  Daughter reports he falls frequently they have difficulty with getting her to encourage assistance, they are using a eye tools, rounding on her regularly, doing everything that they can think of to minimize fall risk at skilled nursing facility at [facility] at this time  Vital signs reassuring.  No preceding history to suggest acute medical illness patient has no acute medical complaints other than headache over the left forehead where there is a hematoma  Patient's presentation is most consistent with acute complicated illness / injury requiring diagnostic workup.      Clinical Course as of 12/31/23 1650  Sat Dec 31, 2023  1649 Patient's daughter at the bedside.  Helps with her medical decision making.  Agreeable with plan to return to skilled nursing facility at [facility].  She advised they are already working to minimize her risk of falling including regular checks, utilizing a eye tool, etc.  It seems that significant intervention is in place to avoid falling at [facility] already. [MQ]  1649 Patient alert oriented to self and daughter.  Not oriented to living situation reports that she still lives in her home with her husband.  Acting normally per daughter [MQ]  1649 Reviewed CT scan, T4 mild compression deformity [MQ]  1650 Quite far down.  She does not have any associated neck pain today.  May be acute to subacute but I am a little bit suspicious that may be slightly more chronic in nature.  Her previous cervical spine CT does not seem to hard to tell but at this point no intervention to be performed.  Discussed with patient's daughter who is understanding [MQ]    Clinical Course User Index [MQ] Dicky Anes, MD   ----------------------------------------- 4:53 PM on 12/31/2023 -----------------------------------------   Currently ambulating with walker.  She does not appear to have any significant  pain or difficulty at this time.  Discussed with daughter, patient will be discharged back to skilled nursing facility at [facility] to continue care, continue manage fall risk etc.  Return precautions and treatment recommendations and follow-up discussed with the patient who is agreeable with the plan.   FINAL CLINICAL IMPRESSION(S) / ED DIAGNOSES   Final diagnoses:  Fall, initial encounter  Hematoma of scalp, initial encounter     Rx / DC Orders   ED Discharge Orders     None        Note:  This document was prepared using Dragon  voice recognition software and may include unintentional dictation errors.   Dicky Anes, MD 12/31/23 [phone]

## 2024-01-05 ENCOUNTER — Non-Acute Institutional Stay (SKILLED_NURSING_FACILITY): Payer: Self-pay | Admitting: Nurse Practitioner

## 2024-01-05 ENCOUNTER — Encounter: Payer: Self-pay | Admitting: Nurse Practitioner

## 2024-01-05 DIAGNOSIS — S22040D Wedge compression fracture of fourth thoracic vertebra, subsequent encounter for fracture with routine healing: Secondary | ICD-10-CM | POA: Diagnosis not present

## 2024-01-05 DIAGNOSIS — F03B3 Unspecified dementia, moderate, with mood disturbance: Secondary | ICD-10-CM | POA: Diagnosis not present

## 2024-01-05 DIAGNOSIS — S0093XD Contusion of unspecified part of head, subsequent encounter: Secondary | ICD-10-CM | POA: Diagnosis not present

## 2024-01-05 DIAGNOSIS — R296 Repeated falls: Secondary | ICD-10-CM | POA: Diagnosis not present

## 2024-01-05 NOTE — Progress Notes (Signed)
 Location:  Other [place].  Nursing Home Room Number: [facility] [id number] Place of Service:  SNF [phone]) Harlene An, NP  PCP: Abdul Fine, MD  Patient Care Team: Abdul Fine, MD as PCP - General [facility] Medicine)  Extended Emergency Contact Information Primary Emergency Contact: Ivin [facility]  [place] Home Phone: [phone] Relation: Daughter Secondary Emergency Contact: Maple Bridgette Lenis Home Phone: [phone] Mobile Phone: [phone] Relation: Son  Goals of care: Advanced Directive information    12/31/2023    3:29 PM  Advanced Directives  Does Patient Have a Medical Advance Directive? Yes  Type of Advance Directive Out of facility DNR (pink MOST or yellow form)  Does patient want to make changes to medical advance directive? No - Patient declined  Would patient like information on creating a medical advance directive? No - Patient declined  Pre-existing out of facility DNR order (yellow form or pink MOST form) Pink Most/Yellow Form available - Physician notified to receive inpatient order     Chief Complaint  Patient presents with   Hospitalization Follow-up    ED Follow up    HPI:  Pt is a 89 y.o. female seen today for an acute visit for hospital follow up.  She has hx of RA, hypothyroidism, frequent falls.  She has dementia and a poor historian. She was complaining of headache after fall therefore she was sent to ED for evaluation.  She has chronic pain due to RA but but has not been complaining of worsening pain.  No neck pain.   Past Medical History:  Diagnosis Date   Actinic keratosis    Arthritis    Basal cell carcinoma    Left chin, right nasolabial   Closed pelvic fracture (HCC) 12/16/2020   Closed right hip fracture (HCC) 11/30/2019   Collagen vascular disease    RA   GERD 02/01/2007   Qualifier: Diagnosis of   By: Jimmy MD, Charlie Scarlet        Heartburn    Hip fracture (HCC) 11/30/2019   History of  colonic polyps 02/01/2007   Qualifier: Diagnosis of   By: Jimmy MD, Charlie Scarlet     IMO SNOMED Dx Update Oct 2024     History of fracture of right hip 11/30/2019   Formatting of this note might be different from the original.  8/21; s/p THA     Hypertension    Squamous cell carcinoma of skin 05/06/2021   left hand, treated with [facility]   Past Surgical History:  Procedure Laterality Date   ABDOMINAL HYSTERECTOMY     BREAST CYST EXCISION     HIP ARTHROPLASTY Right 11/30/2019   Procedure: ARTHROPLASTY BIPOLAR HIP (HEMIARTHROPLASTY);  Surgeon: Edie Norleen PARAS, MD;  Location: [facility] ORS;  Service: Orthopedics;  Laterality: Right;   SACROPLASTY N/A 12/17/2020   Procedure: SACROPLASTY;  Surgeon: Kathlynn Sharper, MD;  Location: [facility] ORS;  Service: Orthopedics;  Laterality: N/A;   THORACIC AORTIC ENDOVASCULAR STENT GRAFT N/A 04/11/2019   Procedure: THORACIC AORTIC ENDOVASCULAR STENT GRAFT insertion;  Surgeon: Serene Gaile ORN, MD;  Location: [facility] OR;  Service: Vascular;  Laterality: N/A;   ULTRASOUND GUIDANCE FOR VASCULAR ACCESS Right 04/11/2019   Procedure: Ultrasound Guidance For Vascular Access, right femoral artery;  Surgeon: Serene Gaile ORN, MD;  Location: [facility];  Service: Vascular;  Laterality: Right;    Allergies  Allergen Reactions   Amoxicillin  Other (See Comments)    Intolerant  Daughter and Son report patient tolerated course of Augmentin  in January 2024  Codeine Nausea Only   Ibandronate Sodium  [Ibandronate] Other (See Comments)    Gi upset   Risedronate Other (See Comments)    Gi upset   Sulfa Antibiotics Hives    Outpatient Encounter Medications as of 01/05/2024  Medication Sig   acetaminophen  (TYLENOL ) 325 MG tablet Take 650 mg by mouth 2 (two) times daily.   acetaminophen  (TYLENOL ) 500 MG tablet Take 1,000 mg by mouth. Every 12 hours as needed.   amLODipine  (NORVASC ) 10 MG tablet Take 1 tablet (10 mg total) by mouth daily.   Artificial Tear Solution (SOOTHE XP OP) Apply 1 drop to eye  3 (three) times daily.   atorvastatin  (LIPITOR) 20 MG tablet Take 20 mg by mouth daily.   busPIRone (BUSPAR) 5 MG tablet Take 5 mg by mouth 3 (three) times daily.   Calcium  Carbonate (CALCIUM  600 PO) Take 600 mg by mouth in the morning and at bedtime.   carvedilol  (COREG ) 12.5 MG tablet Take 1 tablet (12.5 mg total) by mouth 2 (two) times daily with a meal.   diclofenac Sodium (VOLTAREN) 1 % GEL Apply 4 g topically. Apply to both knees every day and evening shift.   folic acid  (FOLVITE ) 1 MG tablet Take 1 mg by mouth daily.   furosemide (LASIX) 20 MG tablet Take 20 mg by mouth as needed. Take for 5lb weight gain in 1 week.   hydrALAZINE  (APRESOLINE ) 10 MG tablet Take 10 mg by mouth every 12 (twelve) hours as needed. For SBP greater than 140.   levothyroxine  (SYNTHROID , LEVOTHROID) 75 MCG tablet Take 75 mcg by mouth at bedtime.   lidocaine  (SALONPAS PAIN RELIEVING) 4 % Place 1 patch onto the skin daily as needed.   losartan  (COZAAR ) 100 MG tablet Take 1 tablet (100 mg total) by mouth daily.   methotrexate  (RHEUMATREX) 2.5 MG tablet Take 15 mg by mouth every Friday.   Multiple Vitamins-Minerals (MULTIVITAMINS THER. W/MINERALS) TABS tablet Take 1 tablet by mouth daily.   potassium chloride  (KLOR-CON ) 10 MEQ tablet Take 10 mEq by mouth as needed.   predniSONE  (DELTASONE ) 2.5 MG tablet Take 2.5 mg by mouth daily.   senna (SENOKOT) 8.6 MG TABS tablet Take 1 tablet by mouth daily.   sertraline  (ZOLOFT ) 100 MG tablet Take 100 mg by mouth at bedtime.   trimethoprim (TRIMPEX) 100 MG tablet Take 100 mg by mouth daily.   Propylene Glycol (SYSTANE BALANCE OP) PRN order: Instill one drop in both eyes every 12 hours as needed for dry eyes. (Patient not taking: Reported on 01/05/2024)   sertraline  (ZOLOFT ) 50 MG tablet Take 75 mg by mouth daily. (Patient not taking: Reported on 01/05/2024)   No facility-administered encounter medications on file as of 01/05/2024.    Review of Systems  Unable to perform ROS:  Dementia    Immunization History  Administered Date(s) Administered   INFLUENZA, HIGH DOSE SEASONAL PF 01/31/2017, 12/18/2018   Influenza Split 01/21/2014, 12/04/2015   Influenza-Unspecified 01/27/2015, 12/18/2018, 01/01/2020, 01/05/2021, 01/19/2022, 01/26/2023   Moderna Covid-19 Vaccine Bivalent Booster 36yrs & up 09/01/2021   Moderna Sars-Covid-2 Vaccination 04/23/2019, 05/21/2019, 02/19/2020, 07/11/2020, 01/29/2021   Pneumococcal Conjugate-13 03/15/2014   Pneumococcal Polysaccharide-23 04/05/2001, 08/13/2016   RSV,unspecified 01/19/2022   Td 07/04/2005   Td (Adult),5 Lf Tetanus Toxid, Preservative Free 02/15/2017   Tdap 07/27/2005, 02/15/2017   Zoster Recombinant(Shingrix) 12/29/2018, 06/10/2019   Zoster, Live 04/05/2006   Pertinent  Health Maintenance Due  Topic Date Due   Influenza Vaccine  11/04/2023   DEXA SCAN  Discontinued      12/17/2020    2:42 PM 12/18/2020    2:00 AM 12/18/2020    1:00 PM 03/28/2022    6:10 AM 01/04/2023   12:42 PM  Fall Risk  Falls in the past year?     1  Was there an injury with Fall?     1  Fall Risk Category Calculator     3  (RETIRED) Patient Fall Risk Level High fall risk  High fall risk  High fall risk  High fall risk       Data saved with a previous flowsheet row definition   Functional Status Survey:    Vitals:   01/05/24 1531  BP: 139/71  Pulse: 65  Resp: 17  Temp: (!) 93.9 F (34.4 C)  SpO2: 96%  Weight: 163 lb 6.4 oz (74.1 kg)  Height: 5' 6 (1.676 m)   Body mass index is 26.37 kg/m. Physical Exam Constitutional:      Appearance: Normal appearance.  Eyes:     Pupils: Pupils are equal, round, and reactive to light.  Pulmonary:     Effort: Pulmonary effort is normal.  Skin:    General: Skin is warm and dry.     Findings: Bruising (noted over left eye with hematoma) present.  Neurological:     Mental Status: She is alert. Mental status is at baseline.     Motor: Weakness present.     Gait: Gait abnormal (uses  walker).  Psychiatric:        Mood and Affect: Mood normal.     Labs reviewed: Recent Labs    10/17/23 0000  NA 141  K 3.9  CL 108  CO2 28*  BUN 19  CREATININE 0.8  CALCIUM  8.5*   Recent Labs    10/17/23 0000  AST 18  ALT 14  ALKPHOS 101  ALBUMIN 3.6   Recent Labs    10/17/23 0000  WBC 6.2  NEUTROABS 3,354.00  HGB 10.3*  HCT 32*  PLT 179   Lab Results  Component Value Date   TSH 4.67 10/17/2023   Lab Results  Component Value Date   HGBA1C 5.6 08/26/2022   Lab Results  Component Value Date   CHOL 140 10/17/2023   HDL 72 (A) 10/17/2023   LDLCALC 51 10/17/2023   TRIG 87 10/17/2023   CHOLHDL 2.5 04/07/2019    Significant Diagnostic Results in last 30 days:  CT Cervical Spine Wo Contrast Result Date: 12/31/2023 CLINICAL DATA:  Facial trauma, blunt.  Fall from bed. EXAM: CT CERVICAL SPINE WITHOUT CONTRAST TECHNIQUE: Multidetector CT imaging of the cervical spine was performed without intravenous contrast. Multiplanar CT image reconstructions were also generated. RADIATION DOSE REDUCTION: This exam was performed according to the departmental dose-optimization program which includes automated exposure control, adjustment of the mA and/or kV according to patient size and/or use of iterative reconstruction technique. COMPARISON:  CT cervical spine 12/25/2023 and 12/15/2022. Chest CTA 11/19/2022. FINDINGS: Alignment: Mild cervicothoracic scoliosis and exaggerated thoracic kyphosis. No focal angulation or listhesis. Skull base and vertebrae: No evidence of acute cervical spine fracture or traumatic subluxation. There is a superior endplate compression deformity at T4 resulting in approximately 15% loss of vertebral body height and endplate sclerosis. This was not included on previous cervical spine CTs but appears new from chest CTA 11/19/2022. No osseous retropulsion demonstrated. Soft tissues and spinal canal: No prevertebral fluid or swelling. No visible canal hematoma.  Disc levels: Mild multilevel cervical spondylosis with disc space  narrowing, uncinate spurring and facet hypertrophy. No evidence of large disc herniation or high-grade spinal stenosis. Scattered predominantly mild foraminal narrowing. Upper chest: Atherosclerosis of the aorta and great vessels status post aortic stent grafting. 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 Emphysematous changes in both lung apices with stable left apical scarring. Other: None. IMPRESSION: 1. No evidence of acute cervical spine fracture, traumatic subluxation or static signs of instability. 2. Mild superior endplate compression deformity at T4, new from chest CTA 11/19/2022 and possibly acute. No osseous retropulsion. 3. Mild cervical spondylosis. 4. 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 5. Aortic Atherosclerosis (ICD10-I70.0) and Emphysema (ICD10-J43.9). Electronically Signed   By: Elsie Perone M.D.   On: 12/31/2023 16:34   CT Head Wo Contrast Result Date: 12/31/2023 EXAM: CT HEAD WITHOUT CONTRAST 12/31/2023 04:13:19 PM TECHNIQUE: CT of the head was performed without the administration of intravenous contrast. Automated exposure control, iterative reconstruction, and/or weight based adjustment of the mA/kV was utilized to reduce the radiation dose to as low as reasonably achievable. COMPARISON: None available. CLINICAL HISTORY: Facial trauma, blunt. Clemens out of bed. FINDINGS: BRAIN AND VENTRICLES: No acute hemorrhage. No evidence of acute infarct. No hydrocephalus. No extra-axial collection. No mass effect or midline shift. Moderate atrophy and white matter changes are stable. Atherosclerotic calcifications are present in the cavernous carotid arteries bilaterally and at the dural margin of both vertebral arteries. No hyperdense vessel is present. ORBITS: Bilateral lens replacements are noted. The globes and orbits are otherwise within normal limits. SINUSES: Mild mucosal thickening is  present in the maxillary sinuses bilaterally. SOFT TISSUES AND SKULL: A left frontal and temporal scalp hematoma is present. A chronic 5 mm metallic fragment in the superior posterior aspect of the hematoma is chronic and seen on prior studies. No skull fracture. IMPRESSION: 1. No acute intracranial abnormality. 2. Left frontal and temporal scalp hematoma without underlying fracture. Electronically signed by: Lonni Necessary MD 12/31/2023 04:32 PM EDT RP Workstation: [id number]   CT Head Wo Contrast Result Date: 12/25/2023 CLINICAL DATA:  Fall in bathroom.  Hit head.  Head and neck pain. EXAM: CT HEAD WITHOUT CONTRAST CT CERVICAL SPINE WITHOUT CONTRAST TECHNIQUE: Multidetector CT imaging of the head and cervical spine was performed following the standard protocol without intravenous contrast. Multiplanar CT image reconstructions of the cervical spine were also generated. RADIATION DOSE REDUCTION: This exam was performed according to the departmental dose-optimization program which includes automated exposure control, adjustment of the mA and/or kV according to patient size and/or use of iterative reconstruction technique. COMPARISON:  12/15/2022 FINDINGS: CT HEAD FINDINGS Brain: No evidence of intracranial hemorrhage, acute infarction, hydrocephalus, extra-axial collection, or mass lesion/mass effect. Stable mild-to-moderate diffuse cerebral atrophy and chronic small vessel disease. Vascular:  No hyperdense vessel or other acute findings. Skull: No evidence of fracture or other significant bone abnormality. Sinuses/Orbits:  No acute findings. Other: None. CT CERVICAL SPINE FINDINGS Alignment: Normal. Skull base and vertebrae: No acute fracture. No primary bone lesion or focal pathologic process. Soft tissues and spinal canal: No prevertebral fluid or swelling. No visible canal hematoma. Disc levels: Mild degenerative disc disease seen from levels of C3-C7. Left-sided facet DJD is seen from levels of C4-T1.  Upper chest: No acute findings. Other: None. IMPRESSION: No acute intracranial abnormality. Stable cerebral atrophy and chronic small vessel disease. No evidence of acute cervical spine fracture or subluxation. Degenerative spondylosis, as described above. Electronically Signed   By: Norleen DELENA Kil M.D.   On: 12/25/2023 10:52   CT  Cervical Spine Wo Contrast Result Date: 12/25/2023 CLINICAL DATA:  Fall in bathroom.  Hit head.  Head and neck pain. EXAM: CT HEAD WITHOUT CONTRAST CT CERVICAL SPINE WITHOUT CONTRAST TECHNIQUE: Multidetector CT imaging of the head and cervical spine was performed following the standard protocol without intravenous contrast. Multiplanar CT image reconstructions of the cervical spine were also generated. RADIATION DOSE REDUCTION: This exam was performed according to the departmental dose-optimization program which includes automated exposure control, adjustment of the mA and/or kV according to patient size and/or use of iterative reconstruction technique. COMPARISON:  12/15/2022 FINDINGS: CT HEAD FINDINGS Brain: No evidence of intracranial hemorrhage, acute infarction, hydrocephalus, extra-axial collection, or mass lesion/mass effect. Stable mild-to-moderate diffuse cerebral atrophy and chronic small vessel disease. Vascular:  No hyperdense vessel or other acute findings. Skull: No evidence of fracture or other significant bone abnormality. Sinuses/Orbits:  No acute findings. Other: None. CT CERVICAL SPINE FINDINGS Alignment: Normal. Skull base and vertebrae: No acute fracture. No primary bone lesion or focal pathologic process. Soft tissues and spinal canal: No prevertebral fluid or swelling. No visible canal hematoma. Disc levels: Mild degenerative disc disease seen from levels of C3-C7. Left-sided facet DJD is seen from levels of C4-T1. Upper chest: No acute findings. Other: None. IMPRESSION: No acute intracranial abnormality. Stable cerebral atrophy and chronic small vessel disease. No  evidence of acute cervical spine fracture or subluxation. Degenerative spondylosis, as described above. Electronically Signed   By: Norleen DELENA Kil M.D.   On: 12/25/2023 10:52    Assessment/Plan No problem-specific Assessment & Plan notes found for this encounter. 1. Closed wedge compression fracture of T4 vertebra with routine healing, subsequent encounter Partial 15% compression fracture. Age unable to be determined as it was not seen in prior imaging.  No increase in pain. She has multiple falls in the last 6 months.  No intervention at this time Follow up vit D level Continues on cal 600 mg PO BID Continue pain management with tylenol  and nursing to monitor.   2. Traumatic hematoma of head, subsequent encounter Stable, slightly tender on exam but healing.   3. Recurrent falls (Primary) High fall risk  Continue fall precautions by facility  4. Dementia Progressive memory loss which increase fall risk as she does not remember to call for assistance.  Continue support with family and staff.      Inis Borneman K. Caro BODILY [facility] [phone]

## 2024-01-09 LAB — VITAMIN D 25 HYDROXY (VIT D DEFICIENCY, FRACTURES): Vit D, 25-Hydroxy: 31

## 2024-01-23 ENCOUNTER — Encounter: Payer: Self-pay | Admitting: Orthopedic Surgery

## 2024-01-23 ENCOUNTER — Non-Acute Institutional Stay (SKILLED_NURSING_FACILITY): Payer: Self-pay | Admitting: Orthopedic Surgery

## 2024-01-23 DIAGNOSIS — I10 Essential (primary) hypertension: Secondary | ICD-10-CM

## 2024-01-23 DIAGNOSIS — M069 Rheumatoid arthritis, unspecified: Secondary | ICD-10-CM

## 2024-01-23 DIAGNOSIS — R296 Repeated falls: Secondary | ICD-10-CM

## 2024-01-23 DIAGNOSIS — E039 Hypothyroidism, unspecified: Secondary | ICD-10-CM

## 2024-01-23 DIAGNOSIS — F03B3 Unspecified dementia, moderate, with mood disturbance: Secondary | ICD-10-CM

## 2024-01-23 DIAGNOSIS — S0093XD Contusion of unspecified part of head, subsequent encounter: Secondary | ICD-10-CM

## 2024-01-23 DIAGNOSIS — E782 Mixed hyperlipidemia: Secondary | ICD-10-CM | POA: Diagnosis not present

## 2024-01-23 DIAGNOSIS — F418 Other specified anxiety disorders: Secondary | ICD-10-CM

## 2024-01-23 NOTE — Progress Notes (Signed)
 Location:  Other Nursing Home Room Number: [facility] 304/A Place of Service:  SNF [phone]) Provider:  Greig FORBES Cluster, NP   Laurence Locus, DO  Patient Care Team: Laurence Locus, DO as PCP - General (Internal Medicine)  Extended Emergency Contact Information Primary Emergency Contact: Ivin [facility]  [place] Home Phone: [phone] Relation: Daughter Secondary Emergency Contact: Maple Bridgette Lenis Home Phone: [phone] Mobile Phone: [phone] Relation: Son  Code Status:  DNR Goals of care: Advanced Directive information    12/31/2023    3:29 PM  Advanced Directives  Does Patient Have a Medical Advance Directive? Yes  Type of Advance Directive Out of facility DNR (pink MOST or yellow form)  Does patient want to make changes to medical advance directive? No - Patient declined  Would patient like information on creating a medical advance directive? No - Patient declined  Pre-existing out of facility DNR order (yellow form or pink MOST form) Pink Most/Yellow Form available - Physician notified to receive inpatient order     Chief Complaint  Patient presents with   Medical Management of Chronic Issues    HPI:  Pt is a 89 y.o. female seen today for medical management of chronic diseases.    She currently resides on the skilled nursing unit at [facility]. PMH: dissection of descending/ascending thoracic aorta, HTN, HLD, PVD, pulmonary nodule, hypothyroidism, dementia, osteopenia, RA, recurrent UTI, PMR, anemia and depression.   HTN- BUN/creat 19/0.8 10/17/2023, remains on amlodipine , carvedilol  and losartan  HLD- total 140, LDL 51 10/17/2023, remains on atorvastatin  Hypothyroidism- TSH 4.57 10/17/2023, remains on levothyroxine  RA- followed by rheumatology, remains on Remicade infusions, methotrexate  and prednisone  Dementia- 12/2023 mild to moderate cerebral atrophy and chronic small vessel disease, no behaviors, weight stable, not on medication  Recurrent falls-  09/27 fall with head injury  Hematoma of head- 09/27 fall with head injury Depression/anxiety- no changes in mood, remains on Buspar and Zoloft   Recent weights:  10/01- 163.4 lbs  09/03- 164.8 lbs  08/06- 163.8 lbs   Recent blood pressures:  10/19- 128/76  10/18- 124/72, 110/63        Past Medical History:  Diagnosis Date   Actinic keratosis    Arthritis    Basal cell carcinoma    Left chin, right nasolabial   Closed pelvic fracture (HCC) 12/16/2020   Closed right hip fracture (HCC) 11/30/2019   Collagen vascular disease    RA   GERD 02/01/2007   Qualifier: Diagnosis of   By: Jimmy MD, Charlie Scarlet        Heartburn    Hip fracture (HCC) 11/30/2019   History of colonic polyps 02/01/2007   Qualifier: Diagnosis of   By: Jimmy MD, Charlie Scarlet     IMO SNOMED Dx Update Oct 2024     History of fracture of right hip 11/30/2019   Formatting of this note might be different from the original.  8/21; s/p THA     Hypertension    Squamous cell carcinoma of skin 05/06/2021   left hand, treated with [facility]   Past Surgical History:  Procedure Laterality Date   ABDOMINAL HYSTERECTOMY     BREAST CYST EXCISION     HIP ARTHROPLASTY Right 11/30/2019   Procedure: ARTHROPLASTY BIPOLAR HIP (HEMIARTHROPLASTY);  Surgeon: Edie Norleen PARAS, MD;  Location: [facility] ORS;  Service: Orthopedics;  Laterality: Right;   SACROPLASTY N/A 12/17/2020   Procedure: SACROPLASTY;  Surgeon: Kathlynn Sharper, MD;  Location: [facility] ORS;  Service: Orthopedics;  Laterality: N/A;  THORACIC AORTIC ENDOVASCULAR STENT GRAFT N/A 04/11/2019   Procedure: THORACIC AORTIC ENDOVASCULAR STENT GRAFT insertion;  Surgeon: Serene Gaile ORN, MD;  Location: [facility] OR;  Service: Vascular;  Laterality: N/A;   ULTRASOUND GUIDANCE FOR VASCULAR ACCESS Right 04/11/2019   Procedure: Ultrasound Guidance For Vascular Access, right femoral artery;  Surgeon: Serene Gaile ORN, MD;  Location: [facility] OR;  Service: Vascular;  Laterality: Right;    Allergies  Allergen  Reactions   Amoxicillin  Other (See Comments)    Intolerant  Daughter and Son report patient tolerated course of Augmentin  in January 2024   Codeine Nausea Only   Ibandronate Sodium  [Ibandronate] Other (See Comments)    Gi upset   Risedronate Other (See Comments)    Gi upset   Sulfa Antibiotics Hives    Outpatient Encounter Medications as of 01/23/2024  Medication Sig   acetaminophen  (TYLENOL ) 325 MG tablet Take 650 mg by mouth 2 (two) times daily.   acetaminophen  (TYLENOL ) 500 MG tablet Take 1,000 mg by mouth. Every 12 hours as needed.   amLODipine  (NORVASC ) 10 MG tablet Take 1 tablet (10 mg total) by mouth daily.   Artificial Tear Solution (SOOTHE XP OP) Apply 1 drop to eye 3 (three) times daily.   atorvastatin  (LIPITOR) 20 MG tablet Take 20 mg by mouth daily.   busPIRone (BUSPAR) 5 MG tablet Take 5 mg by mouth 3 (three) times daily.   Calcium  Carbonate (CALCIUM  600 PO) Take 600 mg by mouth in the morning and at bedtime.   carvedilol  (COREG ) 12.5 MG tablet Take 1 tablet (12.5 mg total) by mouth 2 (two) times daily with a meal.   diclofenac Sodium (VOLTAREN) 1 % GEL Apply 4 g topically. Apply to both knees every day and evening shift.   folic acid  (FOLVITE ) 1 MG tablet Take 1 mg by mouth daily.   furosemide (LASIX) 20 MG tablet Take 20 mg by mouth as needed. Take for 5lb weight gain in 1 week.   hydrALAZINE  (APRESOLINE ) 10 MG tablet Take 10 mg by mouth every 12 (twelve) hours as needed. For SBP greater than 140.   levothyroxine  (SYNTHROID , LEVOTHROID) 75 MCG tablet Take 75 mcg by mouth at bedtime.   lidocaine  (SALONPAS PAIN RELIEVING) 4 % Place 1 patch onto the skin daily as needed.   losartan  (COZAAR ) 100 MG tablet Take 1 tablet (100 mg total) by mouth daily.   methotrexate  (RHEUMATREX) 2.5 MG tablet Take 15 mg by mouth every Friday.   Multiple Vitamins-Minerals (MULTIVITAMINS THER. W/MINERALS) TABS tablet Take 1 tablet by mouth daily.   potassium chloride  (KLOR-CON ) 10 MEQ tablet  Take 10 mEq by mouth as needed.   predniSONE  (DELTASONE ) 2.5 MG tablet Take 2.5 mg by mouth daily.   Propylene Glycol (SYSTANE BALANCE OP) PRN order: Instill one drop in both eyes every 12 hours as needed for dry eyes. (Patient not taking: Reported on 01/05/2024)   senna (SENOKOT) 8.6 MG TABS tablet Take 1 tablet by mouth daily.   sertraline  (ZOLOFT ) 100 MG tablet Take 100 mg by mouth at bedtime.   sertraline  (ZOLOFT ) 50 MG tablet Take 75 mg by mouth daily. (Patient not taking: Reported on 01/05/2024)   trimethoprim (TRIMPEX) 100 MG tablet Take 100 mg by mouth daily.   No facility-administered encounter medications on file as of 01/23/2024.    Review of Systems  Unable to perform ROS: Dementia    Immunization History  Administered Date(s) Administered   INFLUENZA, HIGH DOSE SEASONAL PF 01/31/2017, 12/18/2018   Influenza  Split 01/21/2014, 12/04/2015   Influenza-Unspecified 01/27/2015, 12/18/2018, 01/01/2020, 01/05/2021, 01/19/2022, 01/26/2023   Moderna Covid-19 Vaccine Bivalent Booster 100yrs & up 09/01/2021   Moderna Sars-Covid-2 Vaccination 04/23/2019, 05/21/2019, 02/19/2020, 07/11/2020, 01/29/2021   Pneumococcal Conjugate-13 03/15/2014   Pneumococcal Polysaccharide-23 04/05/2001, 08/13/2016   RSV,unspecified 01/19/2022   Td 07/04/2005   Td (Adult),5 Lf Tetanus Toxid, Preservative Free 02/15/2017   Tdap 07/27/2005, 02/15/2017   Zoster Recombinant(Shingrix) 12/29/2018, 06/10/2019   Zoster, Live 04/05/2006   Pertinent  Health Maintenance Due  Topic Date Due   Influenza Vaccine  11/04/2023   DEXA SCAN  Discontinued      12/17/2020    2:42 PM 12/18/2020    2:00 AM 12/18/2020    1:00 PM 03/28/2022    6:10 AM 01/04/2023   12:42 PM  Fall Risk  Falls in the past year?     1  Was there an injury with Fall?     1  Fall Risk Category Calculator     3  (RETIRED) Patient Fall Risk Level High fall risk  High fall risk  High fall risk  High fall risk       Data saved with a previous  flowsheet row definition   Functional Status Survey:    Vitals:   01/23/24 1551  BP: (!) 172/72  Pulse: 60  Resp: 16  Temp: 98.6 F (37 C)  SpO2: 95%  Weight: 163 lb 6.4 oz (74.1 kg)  Height: 5' 6 (1.676 m)   Body mass index is 26.37 kg/m. Physical Exam Vitals reviewed.  Constitutional:      General: She is not in acute distress. HENT:     Head: Normocephalic and atraumatic.     Right Ear: There is no impacted cerumen.     Left Ear: There is no impacted cerumen.     Nose: Nose normal.     Mouth/Throat:     Mouth: Mucous membranes are moist.  Eyes:     General:        Right eye: No discharge.        Left eye: No discharge.  Cardiovascular:     Rate and Rhythm: Normal rate and regular rhythm.     Pulses: Normal pulses.     Heart sounds: Normal heart sounds.  Pulmonary:     Effort: Pulmonary effort is normal. No respiratory distress.     Breath sounds: Normal breath sounds. No wheezing or rales.  Abdominal:     General: Bowel sounds are normal. There is no distension.     Palpations: Abdomen is soft.     Tenderness: There is no abdominal tenderness.  Musculoskeletal:     Cervical back: Neck supple.     Right lower leg: No edema.     Left lower leg: No edema.  Skin:    General: Skin is warm.     Capillary Refill: Capillary refill takes less than 2 seconds.  Neurological:     General: No focal deficit present.     Mental Status: She is alert. Mental status is at baseline.     Gait: Gait abnormal.  Psychiatric:        Mood and Affect: Mood normal.     Comments: Very pleasant, follows commands, alert to self/person     Labs reviewed: Recent Labs    10/17/23 0000  NA 141  K 3.9  CL 108  CO2 28*  BUN 19  CREATININE 0.8  CALCIUM  8.5*   Recent Labs    10/17/23  0000  AST 18  ALT 14  ALKPHOS 101  ALBUMIN 3.6   Recent Labs    10/17/23 0000  WBC 6.2  NEUTROABS 3,354.00  HGB 10.3*  HCT 32*  PLT 179   Lab Results  Component Value Date   TSH  4.67 10/17/2023   Lab Results  Component Value Date   HGBA1C 5.6 08/26/2022   Lab Results  Component Value Date   CHOL 140 10/17/2023   HDL 72 (A) 10/17/2023   LDLCALC 51 10/17/2023   TRIG 87 10/17/2023   CHOLHDL 2.5 04/07/2019    Significant Diagnostic Results in last 30 days:  CT Cervical Spine Wo Contrast Result Date: 12/31/2023 CLINICAL DATA:  Facial trauma, blunt.  Fall from bed. EXAM: CT CERVICAL SPINE WITHOUT CONTRAST TECHNIQUE: Multidetector CT imaging of the cervical spine was performed without intravenous contrast. Multiplanar CT image reconstructions were also generated. RADIATION DOSE REDUCTION: This exam was performed according to the departmental dose-optimization program which includes automated exposure control, adjustment of the mA and/or kV according to patient size and/or use of iterative reconstruction technique. COMPARISON:  CT cervical spine 12/25/2023 and 12/15/2022. Chest CTA 11/19/2022. FINDINGS: Alignment: Mild cervicothoracic scoliosis and exaggerated thoracic kyphosis. No focal angulation or listhesis. Skull base and vertebrae: No evidence of acute cervical spine fracture or traumatic subluxation. There is a superior endplate compression deformity at T4 resulting in approximately 15% loss of vertebral body height and endplate sclerosis. This was not included on previous cervical spine CTs but appears new from chest CTA 11/19/2022. No osseous retropulsion demonstrated. Soft tissues and spinal canal: No prevertebral fluid or swelling. No visible canal hematoma. Disc levels: Mild multilevel cervical spondylosis with disc space narrowing, uncinate spurring and facet hypertrophy. No evidence of large disc herniation or high-grade spinal stenosis. Scattered predominantly mild foraminal narrowing. Upper chest: Atherosclerosis of the aorta and great vessels status post aortic stent grafting. 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 Emphysematous changes in both  lung apices with stable left apical scarring. Other: None. IMPRESSION: 1. No evidence of acute cervical spine fracture, traumatic subluxation or static signs of instability. 2. Mild superior endplate compression deformity at T4, new from chest CTA 11/19/2022 and possibly acute. No osseous retropulsion. 3. Mild cervical spondylosis. 4. 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 5. Aortic Atherosclerosis (ICD10-I70.0) and Emphysema (ICD10-J43.9). Electronically Signed   By: Elsie Perone M.D.   On: 12/31/2023 16:34   CT Head Wo Contrast Result Date: 12/31/2023 EXAM: CT HEAD WITHOUT CONTRAST 12/31/2023 04:13:19 PM TECHNIQUE: CT of the head was performed without the administration of intravenous contrast. Automated exposure control, iterative reconstruction, and/or weight based adjustment of the mA/kV was utilized to reduce the radiation dose to as low as reasonably achievable. COMPARISON: None available. CLINICAL HISTORY: Facial trauma, blunt. Clemens out of bed. FINDINGS: BRAIN AND VENTRICLES: No acute hemorrhage. No evidence of acute infarct. No hydrocephalus. No extra-axial collection. No mass effect or midline shift. Moderate atrophy and white matter changes are stable. Atherosclerotic calcifications are present in the cavernous carotid arteries bilaterally and at the dural margin of both vertebral arteries. No hyperdense vessel is present. ORBITS: Bilateral lens replacements are noted. The globes and orbits are otherwise within normal limits. SINUSES: Mild mucosal thickening is present in the maxillary sinuses bilaterally. SOFT TISSUES AND SKULL: A left frontal and temporal scalp hematoma is present. A chronic 5 mm metallic fragment in the superior posterior aspect of the hematoma is chronic and seen on prior studies. No skull fracture. IMPRESSION:  1. No acute intracranial abnormality. 2. Left frontal and temporal scalp hematoma without underlying fracture. Electronically signed by: Lonni Necessary MD 12/31/2023 04:32 PM EDT RP Workstation: [id number]   CT Head Wo Contrast Result Date: 12/25/2023 CLINICAL DATA:  Fall in bathroom.  Hit head.  Head and neck pain. EXAM: CT HEAD WITHOUT CONTRAST CT CERVICAL SPINE WITHOUT CONTRAST TECHNIQUE: Multidetector CT imaging of the head and cervical spine was performed following the standard protocol without intravenous contrast. Multiplanar CT image reconstructions of the cervical spine were also generated. RADIATION DOSE REDUCTION: This exam was performed according to the departmental dose-optimization program which includes automated exposure control, adjustment of the mA and/or kV according to patient size and/or use of iterative reconstruction technique. COMPARISON:  12/15/2022 FINDINGS: CT HEAD FINDINGS Brain: No evidence of intracranial hemorrhage, acute infarction, hydrocephalus, extra-axial collection, or mass lesion/mass effect. Stable mild-to-moderate diffuse cerebral atrophy and chronic small vessel disease. Vascular:  No hyperdense vessel or other acute findings. Skull: No evidence of fracture or other significant bone abnormality. Sinuses/Orbits:  No acute findings. Other: None. CT CERVICAL SPINE FINDINGS Alignment: Normal. Skull base and vertebrae: No acute fracture. No primary bone lesion or focal pathologic process. Soft tissues and spinal canal: No prevertebral fluid or swelling. No visible canal hematoma. Disc levels: Mild degenerative disc disease seen from levels of C3-C7. Left-sided facet DJD is seen from levels of C4-T1. Upper chest: No acute findings. Other: None. IMPRESSION: No acute intracranial abnormality. Stable cerebral atrophy and chronic small vessel disease. No evidence of acute cervical spine fracture or subluxation. Degenerative spondylosis, as described above. Electronically Signed   By: Norleen DELENA Kil M.D.   On: 12/25/2023 10:52   CT Cervical Spine Wo Contrast Result Date: 12/25/2023 CLINICAL DATA:  Fall in bathroom.  Hit  head.  Head and neck pain. EXAM: CT HEAD WITHOUT CONTRAST CT CERVICAL SPINE WITHOUT CONTRAST TECHNIQUE: Multidetector CT imaging of the head and cervical spine was performed following the standard protocol without intravenous contrast. Multiplanar CT image reconstructions of the cervical spine were also generated. RADIATION DOSE REDUCTION: This exam was performed according to the departmental dose-optimization program which includes automated exposure control, adjustment of the mA and/or kV according to patient size and/or use of iterative reconstruction technique. COMPARISON:  12/15/2022 FINDINGS: CT HEAD FINDINGS Brain: No evidence of intracranial hemorrhage, acute infarction, hydrocephalus, extra-axial collection, or mass lesion/mass effect. Stable mild-to-moderate diffuse cerebral atrophy and chronic small vessel disease. Vascular:  No hyperdense vessel or other acute findings. Skull: No evidence of fracture or other significant bone abnormality. Sinuses/Orbits:  No acute findings. Other: None. CT CERVICAL SPINE FINDINGS Alignment: Normal. Skull base and vertebrae: No acute fracture. No primary bone lesion or focal pathologic process. Soft tissues and spinal canal: No prevertebral fluid or swelling. No visible canal hematoma. Disc levels: Mild degenerative disc disease seen from levels of C3-C7. Left-sided facet DJD is seen from levels of C4-T1. Upper chest: No acute findings. Other: None. IMPRESSION: No acute intracranial abnormality. Stable cerebral atrophy and chronic small vessel disease. No evidence of acute cervical spine fracture or subluxation. Degenerative spondylosis, as described above. Electronically Signed   By: Norleen DELENA Kil M.D.   On: 12/25/2023 10:52    Assessment/Plan 1. Essential hypertension (Primary) - controlled amlodipine , carvedilol  and losartan   2. Mixed hyperlipidemia - LDL stable with atorvastatin   3. Hypothyroidism, unspecified type - TSH stable - cont levothyroxine   4.  Rheumatoid arthritis, involving unspecified site, unspecified whether rheumatoid factor present (HCC) - followed by rheumatology -  cont Remicaid infusions, methotrexate , prednisone   5. Moderate dementia with mood disturbance, unspecified dementia type (HCC) - no behaviors - weight stable - dependent with ADLs except feeding - not on medication - cont skilled nursing   6. Recurrent falls - ongoing - 09/27 ED visit due to head hematoma  7. Traumatic hematoma of head, subsequent encounter - resolved   8. Depression with anxiety - no changes in mood or panic attacks - cont Buspar and Zoloft     Family/ staff Communication: Plan discussed with nurse  Labs/tests ordered:  routine labs 02/2024

## 2024-02-13 ENCOUNTER — Ambulatory Visit: Payer: Self-pay

## 2024-02-13 NOTE — Telephone Encounter (Signed)
  FYI Only or Action Required?: FYI only for provider: ED advised.  Patient was last seen in primary care on 04/28/2022 by Abdul Fine, MD.  Called Nurse Triage reporting Fall.  Symptoms began today.  Interventions attempted: Nothing.  Symptoms are: unchanged.  Triage Disposition: Call EMS 911 Now  Patient/caregiver understands and will follow disposition?: Yes  Copied from CRM [id number]. Topic: Clinical - Red Word Triage >> Feb 13, 2024  5:55 PM Gloria [name] wrote: Red Word that prompted transfer to Nurse Triage: the patient fell this afternoon and they are waiting for xray and now the patient has blood in her urine    Karna with [facility] [phone] Reason for Disposition  Major injury from dangerous force (e.g., fall > 10 feet or 3 meters)    Fall, back pain and blood in urine.  Answer Assessment - Initial Assessment Questions 1. MECHANISM: How did the fall happen?     Fall  2. DOMESTIC VIOLENCE AND ELDER ABUSE SCREENING: Did you fall because someone pushed you or tried to hurt you? If Yes, ask: Are you safe now?     Unwitnessed fall at [facility] found on back  3. ONSET: When did the fall happen? (e.g., minutes, hours, or days ago)     Today  4. LOCATION: What part of the body hit the ground? (e.g., back, buttocks, head, hips, knees, hands, head, stomach)     Low back , right shoulder  5. INJURY: Did you hurt (injure) yourself when you fell? If Yes, ask: What did you injure? Tell me more about this? (e.g., body area; type of injury; pain severity)     Back pain, unsure of head injury 6. PAIN: Is there any pain? If Yes, ask: How bad is the pain? (e.g., Scale 0-10; or none, mild,      moderate 7. SIZE: For cuts, bruises, or swelling, ask: How large is it? (e.g., inches or centimeters)      no 8. PREGNANCY: Is there any chance you are pregnant? When was your last menstrual period?     na 9. OTHER SYMPTOMS: Do you have any other symptoms?  (e.g., dizziness, fever, weakness; new-onset or worsening).      Blood in urine, bright red urine  10. CAUSE: What do you think caused the fall (or falling)? (e.g., dizzy spell, tripped)       unknown  1721@ 155/73 Bp HR 66 O2@92 .  Pt has un-supervised fall & now having blood in urine.  Nurse called to report & NT recommended ED: Nurse stated she could not take orders from another nurse and needed on call number: NT provided on call phone number for [facility].  Protocols used: Falls and [facility]

## 2024-02-14 LAB — CBC AND DIFFERENTIAL
HCT: 26 — AB (ref 36–46)
Hemoglobin: 8.3 — AB (ref 12.0–16.0)
Platelets: 85 K/uL — AB (ref 150–400)
WBC: 9.6

## 2024-02-14 LAB — BASIC METABOLIC PANEL WITH GFR
BUN: 18 (ref 4–21)
CO2: 25 — AB (ref 13–22)
Chloride: 107 (ref 99–108)
Creatinine: 0.9 (ref 0.5–1.1)
Glucose: 161
Potassium: 3.7 meq/L (ref 3.5–5.1)
Sodium: 140 (ref 137–147)

## 2024-02-14 LAB — COMPREHENSIVE METABOLIC PANEL WITH GFR
Calcium: 8.5 — AB (ref 8.7–10.7)
eGFR: 60

## 2024-02-14 LAB — CBC: RBC: 2.55 — AB (ref 3.87–5.11)

## 2024-02-14 NOTE — Telephone Encounter (Signed)
Spoke with on call

## 2024-02-14 NOTE — Telephone Encounter (Signed)
 Noted

## 2024-02-15 ENCOUNTER — Non-Acute Institutional Stay (SKILLED_NURSING_FACILITY): Admitting: Orthopedic Surgery

## 2024-02-15 ENCOUNTER — Encounter: Payer: Self-pay | Admitting: Orthopedic Surgery

## 2024-02-15 DIAGNOSIS — M545 Low back pain, unspecified: Secondary | ICD-10-CM | POA: Diagnosis not present

## 2024-02-15 DIAGNOSIS — G8929 Other chronic pain: Secondary | ICD-10-CM

## 2024-02-15 DIAGNOSIS — D649 Anemia, unspecified: Secondary | ICD-10-CM | POA: Diagnosis not present

## 2024-02-15 MED ORDER — PANTOPRAZOLE SODIUM 40 MG PO TBEC
40.0000 mg | DELAYED_RELEASE_TABLET | Freq: Every day | ORAL | Status: AC
Start: 2024-02-15 — End: ?

## 2024-02-15 MED ORDER — PREDNISONE 20 MG PO TABS: 20.0000 mg | ORAL_TABLET | Freq: Every day | ORAL | Status: AC

## 2024-02-15 NOTE — Progress Notes (Signed)
 Location:  Other [place].  Nursing Home Room Number: [facility] [id number] Place of Service:  SNF [phone]) Provider:  Greig Cluster, NP  PCP: Laurence Locus, DO  Patient Care Team: Laurence Locus, DO as PCP - General (Internal Medicine)  Extended Emergency Contact Information Primary Emergency Contact: Ivin [facility]  [place] Home Phone: [phone] Relation: Daughter Secondary Emergency Contact: Maple Bridgette Lenis Home Phone: [phone] Mobile Phone: [phone] Relation: Son  Code Status:  DNR Goals of care: Advanced Directive information    12/31/2023    3:29 PM  Advanced Directives  Does Patient Have a Medical Advance Directive? Yes  Type of Advance Directive Out of facility DNR (pink MOST or yellow form)  Does patient want to make changes to medical advance directive? No - Patient declined  Would patient like information on creating a medical advance directive? No - Patient declined  Pre-existing out of facility DNR order (yellow form or pink MOST form) Pink Most/Yellow Form available - Physician notified to receive inpatient order     Chief Complaint  Patient presents with   Abnormal Lab    Abnormal Lab. Decreased Hemoglobin.     HPI:  Pt is a 89 y.o. female seen today for low hemoglobin and low back pain.   She currently resides on the skilled nursing unit at [facility]. PMH: dissection of descending/ascending thoracic aorta, HTN, HLD, PVD, pulmonary nodule, hypothyroidism, dementia, osteopenia, RA, recurrent UTI, PMR, anemia and depression.   Poor historian due to dementia. H/o chronic back pain and RA. She reports increased low back pain. Nursing states she is ambulating less due to pain. She has been eating meals in room more. She is taking Remicaide infusions, methotrexate  and low dose prednisone  for RA.   Recent hgb 8.3 (11/11) > was 10.3 ( 07/14). No dark stools or hematuria. BUN/creat 18/0.9, GFR 60 (11/11). She is not on PPI.    Past Medical  History:  Diagnosis Date   Actinic keratosis    Arthritis    Basal cell carcinoma    Left chin, right nasolabial   Closed pelvic fracture (HCC) 12/16/2020   Closed right hip fracture (HCC) 11/30/2019   Collagen vascular disease    RA   GERD 02/01/2007   Qualifier: Diagnosis of   By: Jimmy MD, Charlie Scarlet        Heartburn    Hip fracture (HCC) 11/30/2019   History of colonic polyps 02/01/2007   Qualifier: Diagnosis of   By: Jimmy MD, Charlie Scarlet     IMO SNOMED Dx Update Oct 2024     History of fracture of right hip 11/30/2019   Formatting of this note might be different from the original.  8/21; s/p THA     Hypertension    Squamous cell carcinoma of skin 05/06/2021   left hand, treated with [facility]   Past Surgical History:  Procedure Laterality Date   ABDOMINAL HYSTERECTOMY     BREAST CYST EXCISION     HIP ARTHROPLASTY Right 11/30/2019   Procedure: ARTHROPLASTY BIPOLAR HIP (HEMIARTHROPLASTY);  Surgeon: Edie Norleen PARAS, MD;  Location: [facility] ORS;  Service: Orthopedics;  Laterality: Right;   SACROPLASTY N/A 12/17/2020   Procedure: SACROPLASTY;  Surgeon: Kathlynn Sharper, MD;  Location: [facility] ORS;  Service: Orthopedics;  Laterality: N/A;   THORACIC AORTIC ENDOVASCULAR STENT GRAFT N/A 04/11/2019   Procedure: THORACIC AORTIC ENDOVASCULAR STENT GRAFT insertion;  Surgeon: Serene Gaile ORN, MD;  Location: [facility] OR;  Service: Vascular;  Laterality: N/A;   ULTRASOUND  GUIDANCE FOR VASCULAR ACCESS Right 04/11/2019   Procedure: Ultrasound Guidance For Vascular Access, right femoral artery;  Surgeon: Serene Gaile ORN, MD;  Location: [facility] OR;  Service: Vascular;  Laterality: Right;    Allergies  Allergen Reactions   Amoxicillin  Other (See Comments)    Intolerant  Daughter and Son report patient tolerated course of Augmentin  in January 2024   Codeine Nausea Only   Ibandronate Sodium  [Ibandronate] Other (See Comments)    Gi upset   Risedronate Other (See Comments)    Gi upset   Sulfa Antibiotics Hives     Outpatient Encounter Medications as of 02/15/2024  Medication Sig   acetaminophen  (TYLENOL ) 325 MG tablet Take 650 mg by mouth 2 (two) times daily.   acetaminophen  (TYLENOL ) 500 MG tablet Take 1,000 mg by mouth. Every 12 hours as needed.   amLODipine  (NORVASC ) 10 MG tablet Take 1 tablet (10 mg total) by mouth daily.   Artificial Tear Solution (SOOTHE XP OP) Apply 1 drop to eye 3 (three) times daily.   atorvastatin  (LIPITOR) 20 MG tablet Take 20 mg by mouth daily.   busPIRone (BUSPAR) 5 MG tablet Take 5 mg by mouth 3 (three) times daily.   Calcium  Carbonate (CALCIUM  600 PO) Take 600 mg by mouth in the morning and at bedtime.   carvedilol  (COREG ) 12.5 MG tablet Take 1 tablet (12.5 mg total) by mouth 2 (two) times daily with a meal.   diclofenac Sodium (VOLTAREN) 1 % GEL Apply 4 g topically. Apply to both knees every day and evening shift.   folic acid  (FOLVITE ) 1 MG tablet Take 1 mg by mouth daily.   furosemide (LASIX) 20 MG tablet Take 20 mg by mouth as needed. Take for 5lb weight gain in 1 week.   hydrALAZINE  (APRESOLINE ) 10 MG tablet Take 10 mg by mouth every 12 (twelve) hours as needed. For SBP greater than 140.   levothyroxine  (SYNTHROID , LEVOTHROID) 75 MCG tablet Take 75 mcg by mouth at bedtime.   lidocaine  (SALONPAS PAIN RELIEVING) 4 % Place 1 patch onto the skin daily as needed.   losartan  (COZAAR ) 100 MG tablet Take 1 tablet (100 mg total) by mouth daily.   methotrexate  (RHEUMATREX) 2.5 MG tablet Take 15 mg by mouth every Friday.   Multiple Vitamins-Minerals (MULTIVITAMINS THER. W/MINERALS) TABS tablet Take 1 tablet by mouth daily.   potassium chloride  (KLOR-CON ) 10 MEQ tablet Take 10 mEq by mouth as needed.   predniSONE  (DELTASONE ) 2.5 MG tablet Take 2.5 mg by mouth daily.   senna (SENOKOT) 8.6 MG TABS tablet Take 1 tablet by mouth daily.   sertraline  (ZOLOFT ) 100 MG tablet Take 100 mg by mouth at bedtime.   trimethoprim (TRIMPEX) 100 MG tablet Take 100 mg by mouth daily.   No  facility-administered encounter medications on file as of 02/15/2024.    Review of Systems  Unable to perform ROS: Dementia    Immunization History  Administered Date(s) Administered   INFLUENZA, HIGH DOSE SEASONAL PF 01/31/2017, 12/18/2018   Influenza Split 01/21/2014, 12/04/2015   Influenza-Unspecified 01/27/2015, 12/18/2018, 01/01/2020, 01/05/2021, 01/19/2022, 01/26/2023, 01/17/2024   Moderna Covid-19 Vaccine Bivalent Booster 31yrs & up 09/01/2021   Moderna Sars-Covid-2 Vaccination 04/23/2019, 05/21/2019, 02/19/2020, 07/11/2020, 01/29/2021   Pneumococcal Conjugate-13 03/15/2014   Pneumococcal Polysaccharide-23 04/05/2001, 08/13/2016   RSV,unspecified 01/19/2022   Td 07/04/2005   Td (Adult),5 Lf Tetanus Toxid, Preservative Free 02/15/2017   Tdap 07/27/2005, 02/15/2017   Zoster Recombinant(Shingrix) 12/29/2018, 06/10/2019   Zoster, Live 04/05/2006   Pertinent  Health Maintenance Due  Topic Date Due   Influenza Vaccine  Completed   DEXA SCAN  Discontinued      12/18/2020    2:00 AM 12/18/2020    1:00 PM 03/28/2022    6:10 AM 01/04/2023   12:42 PM 01/23/2024    4:22 PM  Fall Risk  Falls in the past year?    1 1  Was there an injury with Fall?    1 1  Fall Risk Category Calculator    3 3  (RETIRED) Patient Fall Risk Level High fall risk  High fall risk  High fall risk     Patient at Risk for Falls Due to     History of fall(s);Impaired balance/gait  Fall risk Follow up     Falls evaluation completed     Data saved with a previous flowsheet row definition   Functional Status Survey:    Vitals:   02/15/24 1249  BP: 131/73  Pulse: 77  Resp: 18  Temp: 97.8 F (36.6 C)  SpO2: 95%  Weight: 163 lb 12.8 oz (74.3 kg)  Height: 5' 6 (1.676 m)   Body mass index is 26.44 kg/m. Physical Exam Vitals reviewed.  Constitutional:      General: She is not in acute distress. HENT:     Head: Normocephalic.  Eyes:     General:        Right eye: No discharge.        Left  eye: No discharge.  Cardiovascular:     Rate and Rhythm: Normal rate and regular rhythm.     Pulses: Normal pulses.     Heart sounds: Normal heart sounds.  Pulmonary:     Effort: Pulmonary effort is normal.     Breath sounds: Normal breath sounds.  Abdominal:     General: Bowel sounds are normal. There is no distension.     Palpations: Abdomen is soft.     Tenderness: There is no abdominal tenderness.  Musculoskeletal:     Cervical back: Neck supple.     Right lower leg: No edema.     Left lower leg: No edema.  Skin:    General: Skin is warm.     Capillary Refill: Capillary refill takes less than 2 seconds.  Neurological:     General: No focal deficit present.     Mental Status: She is alert. Mental status is at baseline.     Gait: Gait abnormal.  Psychiatric:        Mood and Affect: Mood normal.     Labs reviewed: Recent Labs    10/17/23 0000 02/14/24 0000  NA 141 140  K 3.9 3.7  CL 108 107  CO2 28* 25*  BUN 19 18  CREATININE 0.8 0.9  CALCIUM  8.5* 8.5*   Recent Labs    10/17/23 0000  AST 18  ALT 14  ALKPHOS 101  ALBUMIN 3.6   Recent Labs    10/17/23 0000 02/14/24 0000  WBC 6.2 9.6  NEUTROABS 3,354.00  --   HGB 10.3* 8.3*  HCT 32* 26*  PLT 179 85*   Lab Results  Component Value Date   TSH 4.67 10/17/2023   Lab Results  Component Value Date   HGBA1C 5.6 08/26/2022   Lab Results  Component Value Date   CHOL 140 10/17/2023   HDL 72 (A) 10/17/2023   LDLCALC 51 10/17/2023   TRIG 87 10/17/2023   CHOLHDL 2.5 04/07/2019    Significant Diagnostic Results in  last 30 days:  No results found.  Assessment/Plan 1. Chronic midline low back pain without sciatica (Primary) - ongoing - ambulating less due to pain - will increase prednisone  to 20 mg x 7 days, them resume 2.5 mg - cont tylenol  and lidocaine  patches  2. Low hemoglobin - hgb 8.3 (11/11) > was 10.3 ( 07/14) - no sign of blood loss - kidney function appropriate for age - hemoccults x  3 - start pantoprazole  40 mg  - recheck cbc/diff in 2 weeks> unchanged> order iron studies     Family/ staff Communication: plan discussed with patient and nurse  Labs/tests ordered:  hemoccults x 3, cbc/diff in 2 weeks

## 2024-02-20 ENCOUNTER — Encounter: Payer: Self-pay | Admitting: Orthopedic Surgery

## 2024-02-20 ENCOUNTER — Non-Acute Institutional Stay (SKILLED_NURSING_FACILITY): Payer: Self-pay | Admitting: Orthopedic Surgery

## 2024-02-20 DIAGNOSIS — I10 Essential (primary) hypertension: Secondary | ICD-10-CM

## 2024-02-20 DIAGNOSIS — M069 Rheumatoid arthritis, unspecified: Secondary | ICD-10-CM

## 2024-02-20 DIAGNOSIS — E039 Hypothyroidism, unspecified: Secondary | ICD-10-CM | POA: Diagnosis not present

## 2024-02-20 DIAGNOSIS — Z66 Do not resuscitate: Secondary | ICD-10-CM

## 2024-02-20 DIAGNOSIS — E782 Mixed hyperlipidemia: Secondary | ICD-10-CM

## 2024-02-20 DIAGNOSIS — G8929 Other chronic pain: Secondary | ICD-10-CM

## 2024-02-20 DIAGNOSIS — F419 Anxiety disorder, unspecified: Secondary | ICD-10-CM

## 2024-02-20 DIAGNOSIS — M545 Low back pain, unspecified: Secondary | ICD-10-CM

## 2024-02-20 DIAGNOSIS — F3342 Major depressive disorder, recurrent, in full remission: Secondary | ICD-10-CM

## 2024-02-20 DIAGNOSIS — F03B3 Unspecified dementia, moderate, with mood disturbance: Secondary | ICD-10-CM

## 2024-02-20 NOTE — Progress Notes (Unsigned)
 Location:  Other [facility]) Nursing Home Room Number: [id number] Place of Service:  SNF (31) Provider:  Greig CHARLENA Cluster, NP    Patient Care Team: Laurence Locus, DO as PCP - General (Internal Medicine)  Extended Emergency Contact Information Primary Emergency Contact: Ivin [facility]  [place] Home Phone: [phone] Relation: Daughter Secondary Emergency Contact: Maple Bridgette Lenis Home Phone: [phone] Mobile Phone: [phone] Relation: Son  Code Status:  DNR Goals of care: Advanced Directive information    12/31/2023    3:29 PM  Advanced Directives  Does Patient Have a Medical Advance Directive? Yes  Type of Advance Directive Out of facility DNR (pink MOST or yellow form)  Does patient want to make changes to medical advance directive? No - Patient declined  Would patient like information on creating a medical advance directive? No - Patient declined  Pre-existing out of facility DNR order (yellow form or pink MOST form) Pink Most/Yellow Form available - Physician notified to receive inpatient order     Chief Complaint  Patient presents with   Medical Management of Chronic Issues    Routine visit. Discuss need for annual wellness visit     HPI:  Pt is a 89 y.o. female seen today for medical management of chronic diseases.    She currently resides on the skilled nursing unit at [facility]. PMH: dissection of descending/ascending thoracic aorta, HTN, HLD, PVD, pulmonary nodule, hypothyroidism, dementia, osteopenia, RA, recurrent UTI, PMR, anemia and depression.    Chronic midline back pain- h/o recurrent falls, she started ambulating less due to pain, xrays were unremarkable but did note L5-S1 vertebral bodies show modest degenerative osteophytic spurring and old T9 compression fracture, she missed recent RA infusion due to pain, 11/12 she was started on increased prednisone  x 1 week, no improvement today, daughter has rescheduled infusion, she is receiving  lidocaine  patches and  tylenol   HTN- BUN/creat 18/0.9 02/14/2024, remains on amlodipine , carvedilol  and losartan  HLD- total 140, LDL 51 10/17/2023, remains on atorvastatin  Hypothyroidism- TSH 4.57 10/17/2023, remains on levothyroxine  RA- followed by rheumatology, remains on Remicade infusions, methotrexate  and prednisone  Dementia- 12/2023 mild to moderate cerebral atrophy and chronic small vessel disease, no behaviors, weight stable, not on medication  Depression/anxiety- no changes in mood, Na+ 140 02/14/2024, remains on Buspar and Zoloft   No recent falls or injuries. 09/27 fall with head injury.   Recent weights:  11/06- 163.8 lbs  10/01- 163.4 lbs  09/03- 164.8 lbs  Recent blood pressures:  11/17- 134/68  11/16- 148/70  11/15- 124/71   Past Medical History:  Diagnosis Date   Actinic keratosis    Arthritis    Basal cell carcinoma    Left chin, right nasolabial   Closed pelvic fracture (HCC) 12/16/2020   Closed right hip fracture (HCC) 11/30/2019   Collagen vascular disease    RA   GERD 02/01/2007   Qualifier: Diagnosis of   By: Jimmy MD, Charlie Scarlet        Heartburn    Hip fracture (HCC) 11/30/2019   History of colonic polyps 02/01/2007   Qualifier: Diagnosis of   By: Jimmy MD, Charlie Scarlet     IMO SNOMED Dx Update Oct 2024     History of fracture of right hip 11/30/2019   Formatting of this note might be different from the original.  8/21; s/p THA     Hypertension    Squamous cell carcinoma of skin 05/06/2021   left hand, treated with [facility]   Past Surgical  History:  Procedure Laterality Date   ABDOMINAL HYSTERECTOMY     BREAST CYST EXCISION     HIP ARTHROPLASTY Right 11/30/2019   Procedure: ARTHROPLASTY BIPOLAR HIP (HEMIARTHROPLASTY);  Surgeon: Edie Norleen PARAS, MD;  Location: [facility] ORS;  Service: Orthopedics;  Laterality: Right;   SACROPLASTY N/A 12/17/2020   Procedure: SACROPLASTY;  Surgeon: Kathlynn Sharper, MD;  Location: [facility] ORS;  Service: Orthopedics;  Laterality:  N/A;   THORACIC AORTIC ENDOVASCULAR STENT GRAFT N/A 04/11/2019   Procedure: THORACIC AORTIC ENDOVASCULAR STENT GRAFT insertion;  Surgeon: Serene Gaile ORN, MD;  Location: [facility] OR;  Service: Vascular;  Laterality: N/A;   ULTRASOUND GUIDANCE FOR VASCULAR ACCESS Right 04/11/2019   Procedure: Ultrasound Guidance For Vascular Access, right femoral artery;  Surgeon: Serene Gaile ORN, MD;  Location: [facility] OR;  Service: Vascular;  Laterality: Right;    Allergies  Allergen Reactions   Amoxicillin  Other (See Comments)    Intolerant  Daughter and Son report patient tolerated course of Augmentin  in January 2024   Codeine Nausea Only   Ibandronate Sodium  [Ibandronate] Other (See Comments)    Gi upset   Risedronate Other (See Comments)    Gi upset   Sulfa Antibiotics Hives    Outpatient Encounter Medications as of 02/20/2024  Medication Sig   acetaminophen  (TYLENOL ) 500 MG tablet Take 1,000 mg by mouth 2 (two) times daily.   amLODipine  (NORVASC ) 10 MG tablet Take 1 tablet (10 mg total) by mouth daily.   Artificial Tear Solution (SOOTHE XP OP) Apply 1 drop to eye 3 (three) times daily.   atorvastatin  (LIPITOR) 20 MG tablet Take 20 mg by mouth daily.   busPIRone (BUSPAR) 5 MG tablet Take 5 mg by mouth 3 (three) times daily.   Calcium  Carbonate (CALCIUM  600 PO) Take 600 mg by mouth in the morning and at bedtime.   carvedilol  (COREG ) 6.25 MG tablet Take 6.25 mg by mouth 2 (two) times daily with a meal. Contact provider if SBP < 105 or HR <55   diclofenac Sodium (VOLTAREN) 1 % GEL Apply 4 g topically. Apply to both knees every day and evening shift.   folic acid  (FOLVITE ) 1 MG tablet Take 1 mg by mouth daily.   furosemide (LASIX) 20 MG tablet Take 20 mg by mouth as needed. Take for 5lb weight gain in 1 week.   hydrALAZINE  (APRESOLINE ) 10 MG tablet Take 10 mg by mouth every 12 (twelve) hours as needed. For SBP greater than 140.   levothyroxine  (SYNTHROID , LEVOTHROID) 75 MCG tablet Take 75 mcg by mouth at  bedtime.   lidocaine  (SALONPAS PAIN RELIEVING) 4 % Place 1 patch onto the skin daily as needed.   losartan  (COZAAR ) 100 MG tablet Take 1 tablet (100 mg total) by mouth daily.   methotrexate  (RHEUMATREX) 2.5 MG tablet Take 15 mg by mouth every Friday.   Multiple Vitamins-Minerals (MULTIVITAMINS THER. W/MINERALS) TABS tablet Take 1 tablet by mouth daily.   pantoprazole  (PROTONIX ) 40 MG tablet Take 1 tablet (40 mg total) by mouth daily.   potassium chloride  (KLOR-CON ) 10 MEQ tablet Take 10 mEq by mouth as needed.   predniSONE  (DELTASONE ) 2.5 MG tablet Take 2.5 mg by mouth daily.   predniSONE  (DELTASONE ) 20 MG tablet Take 1 tablet (20 mg total) by mouth daily with breakfast for 7 days. Hold prednisone  2.5 mg while taking 20 mg   senna (SENOKOT) 8.6 MG TABS tablet Take 1 tablet by mouth daily.   sertraline  (ZOLOFT ) 100 MG tablet Take 100 mg by mouth  at bedtime.   trimethoprim (TRIMPEX) 100 MG tablet Take 100 mg by mouth daily.   [DISCONTINUED] acetaminophen  (TYLENOL ) 325 MG tablet Take 650 mg by mouth 2 (two) times daily.   [DISCONTINUED] carvedilol  (COREG ) 12.5 MG tablet Take 1 tablet (12.5 mg total) by mouth 2 (two) times daily with a meal.   No facility-administered encounter medications on file as of 02/20/2024.    Review of Systems  Unable to perform ROS: Dementia    Immunization History  Administered Date(s) Administered   INFLUENZA, HIGH DOSE SEASONAL PF 01/31/2017, 12/18/2018   Influenza Split 01/21/2014, 12/04/2015   Influenza-Unspecified 01/27/2015, 12/18/2018, 01/01/2020, 01/05/2021, 01/19/2022, 01/26/2023, 01/17/2024   Moderna Covid-19 Vaccine Bivalent Booster 52yrs & up 09/01/2021   Moderna Sars-Covid-2 Vaccination 04/23/2019, 05/21/2019, 02/19/2020, 07/11/2020, 01/29/2021   Pneumococcal Conjugate-13 03/15/2014   Pneumococcal Polysaccharide-23 04/05/2001, 08/13/2016   RSV,unspecified 01/19/2022   Td 07/04/2005   Td (Adult),5 Lf Tetanus Toxid, Preservative Free 02/15/2017    Tdap 07/27/2005, 02/15/2017   Zoster Recombinant(Shingrix) 12/29/2018, 06/10/2019   Zoster, Live 04/05/2006   Pertinent  Health Maintenance Due  Topic Date Due   Influenza Vaccine  Completed   DEXA SCAN  Discontinued      12/18/2020    1:00 PM 03/28/2022    6:10 AM 01/04/2023   12:42 PM 01/23/2024    4:22 PM 02/15/2024    5:56 PM  Fall Risk  Falls in the past year?   1 1 1   Was there an injury with Fall?   1 1 1   Fall Risk Category Calculator   3 3 3   (RETIRED) Patient Fall Risk Level High fall risk  High fall risk      Patient at Risk for Falls Due to    History of fall(s);Impaired balance/gait History of fall(s);Impaired balance/gait  Fall risk Follow up    Falls evaluation completed Falls evaluation completed     Data saved with a previous flowsheet row definition   Functional Status Survey:    Vitals:   02/20/24 1501  BP: 134/68  Pulse: 64  Resp: 20  Temp: 97.7 F (36.5 C)  SpO2: 92%  Weight: 163 lb 12.8 oz (74.3 kg)  Height: 5' 6 (1.676 m)   Body mass index is 26.44 kg/m. Physical Exam Vitals reviewed.  Constitutional:      General: She is not in acute distress. HENT:     Head: Normocephalic.  Eyes:     General:        Right eye: No discharge.        Left eye: No discharge.  Cardiovascular:     Rate and Rhythm: Normal rate and regular rhythm.     Pulses: Normal pulses.     Heart sounds: Normal heart sounds.  Pulmonary:     Effort: Pulmonary effort is normal. No respiratory distress.     Breath sounds: Normal breath sounds. No wheezing.  Abdominal:     General: Bowel sounds are normal. There is no distension.     Tenderness: There is no abdominal tenderness.  Musculoskeletal:     Cervical back: Normal and neck supple.     Thoracic back: Tenderness present. No swelling or deformity. Decreased range of motion.     Lumbar back: Tenderness present. No swelling or deformity. Decreased range of motion.     Right lower leg: No edema.     Left lower leg:  No edema.  Skin:    General: Skin is warm.     Capillary Refill: Capillary  refill takes less than 2 seconds.  Neurological:     General: No focal deficit present.     Mental Status: She is alert. Mental status is at baseline.     Motor: Weakness present.     Gait: Gait abnormal.  Psychiatric:        Mood and Affect: Mood normal.     Labs reviewed: Recent Labs    10/17/23 0000 02/14/24 0000  NA 141 140  K 3.9 3.7  CL 108 107  CO2 28* 25*  BUN 19 18  CREATININE 0.8 0.9  CALCIUM  8.5* 8.5*   Recent Labs    10/17/23 0000  AST 18  ALT 14  ALKPHOS 101  ALBUMIN 3.6   Recent Labs    10/17/23 0000 02/14/24 0000  WBC 6.2 9.6  NEUTROABS 3,354.00  --   HGB 10.3* 8.3*  HCT 32* 26*  PLT 179 85*   Lab Results  Component Value Date   TSH 4.67 10/17/2023   Lab Results  Component Value Date   HGBA1C 5.6 08/26/2022   Lab Results  Component Value Date   CHOL 140 10/17/2023   HDL 72 (A) 10/17/2023   LDLCALC 51 10/17/2023   TRIG 87 10/17/2023   CHOLHDL 2.5 04/07/2019    Significant Diagnostic Results in last 30 days:  No results found.  Assessment/Plan: 1. Chronic midline low back pain without sciatica (Primary) - ongoing - recent xrays showed old T9 compression fracture and spurring to L5-S1 - unsuccessful trial increased prednisone   - cont tylenol  and lidocaine  patches - scheduled to get infusion soon  - will try gabapentin 100 mg po at bedtime  - PT/OT evaluation  2. Essential hypertension - controlled with amlodipine , carvedilol  and losartan   3. Mixed hyperlipidemia - cont atorvastatin   4. Acquired hypothyroidism - TSH stable - cont levothryoxine  5. Rheumatoid arthritis, involving unspecified site, unspecified whether rheumatoid factor present (HCC) - ongoing - on Remicade infusions  - cont prednisone  and methotrexate   6. Moderate dementia with mood disturbance, unspecified dementia type (HCC) - no behaviors - weight stable - decline in  mobility> see above - not on medication  7. Recurrent major depressive disorder, in full remission - stable mood - Na+ normal - cont Zoloft   8. Anxiety - no recent panic - cont buspar    Family/ staff Communication: plan discussed with patient, daughter, nurse  Labs/tests ordered:  PT/OT evaluation

## 2024-02-21 MED ORDER — GABAPENTIN 100 MG PO CAPS: 100.0000 mg | ORAL_CAPSULE | Freq: Every day | ORAL | Status: AC

## 2024-02-29 ENCOUNTER — Non-Acute Institutional Stay (SKILLED_NURSING_FACILITY): Admitting: Nurse Practitioner

## 2024-02-29 ENCOUNTER — Encounter: Payer: Self-pay | Admitting: Nurse Practitioner

## 2024-02-29 DIAGNOSIS — Z0000 Encounter for general adult medical examination without abnormal findings: Secondary | ICD-10-CM

## 2024-02-29 LAB — CBC AND DIFFERENTIAL
HCT: 32 — AB (ref 36–46)
Hemoglobin: 10.5 — AB (ref 12.0–16.0)
Neutrophils Absolute: 6953
Platelets: 195 K/uL (ref 150–400)
WBC: 10.3

## 2024-02-29 LAB — CBC: RBC: 3.28 — AB (ref 3.87–5.11)

## 2024-02-29 NOTE — Patient Instructions (Signed)
 [name],  Thank you for taking the time for your Medicare Wellness Visit. I appreciate your continued commitment to your health goals. Please review the care plan we discussed, and feel free to reach out if I can assist you further.  Please note that Annual Wellness Visits do not include a physical exam. Some assessments may be limited, especially if the visit was conducted virtually. If needed, we may recommend an in-person follow-up with your provider.  Ongoing Care Seeing your primary care provider every 3 to 6 months helps us  monitor your health and provide consistent, personalized care.   Referrals If a referral was made during today's visit and you haven't received any updates within two weeks, please contact the referred provider directly to check on the status.  Recommended Screenings:  Health Maintenance  Topic Date Due   COVID-19 Vaccine (7 - 2025-26 season) 12/05/2023   Medicare Annual Wellness Visit  01/04/2024   DTaP/Tdap/Td vaccine (5 - Td or Tdap) 02/16/2027   Pneumococcal Vaccine for age over 59  Completed   Flu Shot  Completed   Zoster (Shingles) Vaccine  Completed   Meningitis B Vaccine  Aged Out   Osteoporosis screening with Bone Density Scan  Discontinued       02/29/2024    8:51 AM  Advanced Directives  Does Patient Have a Medical Advance Directive? Yes  Type of Advance Directive Out of facility DNR (pink MOST or yellow form)  Does patient want to make changes to medical advance directive? No - Patient declined    Vision: Annual vision screenings are recommended for early detection of glaucoma, cataracts, and diabetic retinopathy. These exams can also reveal signs of chronic conditions such as diabetes and high blood pressure.  Dental: Annual dental screenings help detect early signs of oral cancer, gum disease, and other conditions linked to overall health, including heart disease and diabetes.  Please see the attached documents for additional  preventive care recommendations.

## 2024-02-29 NOTE — Progress Notes (Signed)
 Chief Complaint  Patient presents with   Medicare Wellness    AWV     Subjective:   Gloria [name] is a 89 y.o. female who presents for a Medicare Annual Wellness Visit.  Allergies (verified) Amoxicillin , Codeine, Ibandronate sodium  [ibandronate], Risedronate, and Sulfa antibiotics   History: Past Medical History:  Diagnosis Date   Actinic keratosis    Arthritis    Basal cell carcinoma    Left chin, right nasolabial   Closed pelvic fracture (HCC) 12/16/2020   Closed right hip fracture (HCC) 11/30/2019   Collagen vascular disease    RA   GERD 02/01/2007   Qualifier: Diagnosis of   By: Jimmy MD, Charlie Scarlet        Heartburn    Hip fracture (HCC) 11/30/2019   History of colonic polyps 02/01/2007   Qualifier: Diagnosis of   By: Jimmy MD, Charlie Scarlet     IMO SNOMED Dx Update Oct 2024     History of fracture of right hip 11/30/2019   Formatting of this note might be different from the original.  8/21; s/p THA     Hypertension    Squamous cell carcinoma of skin 05/06/2021   left hand, treated with [facility]   Past Surgical History:  Procedure Laterality Date   ABDOMINAL HYSTERECTOMY     BREAST CYST EXCISION     HIP ARTHROPLASTY Right 11/30/2019   Procedure: ARTHROPLASTY BIPOLAR HIP (HEMIARTHROPLASTY);  Surgeon: Edie Norleen PARAS, MD;  Location: [facility] ORS;  Service: Orthopedics;  Laterality: Right;   SACROPLASTY N/A 12/17/2020   Procedure: SACROPLASTY;  Surgeon: Kathlynn Sharper, MD;  Location: [facility] ORS;  Service: Orthopedics;  Laterality: N/A;   THORACIC AORTIC ENDOVASCULAR STENT GRAFT N/A 04/11/2019   Procedure: THORACIC AORTIC ENDOVASCULAR STENT GRAFT insertion;  Surgeon: Serene Gaile ORN, MD;  Location: [facility] OR;  Service: Vascular;  Laterality: N/A;   ULTRASOUND GUIDANCE FOR VASCULAR ACCESS Right 04/11/2019   Procedure: Ultrasound Guidance For Vascular Access, right femoral artery;  Surgeon: Serene Gaile ORN, MD;  Location: [facility] OR;  Service: Vascular;  Laterality: Right;   Family  History  Problem Relation Age of Onset   Kidney disease Neg Hx    Bladder Cancer Neg Hx    Social History   Occupational History   Not on file  Tobacco Use   Smoking status: Never   Smokeless tobacco: Never  Vaping Use   Vaping status: Never Used  Substance and Sexual Activity   Alcohol  use: No    Alcohol /week: 0.0 standard drinks of alcohol    Drug use: Never   Sexual activity: Not Currently   Tobacco Counseling Counseling given: Not Answered  SDOH Screenings   Food Insecurity: No Food Insecurity (06/03/2022)  Housing: Unknown (04/12/2023)   Received [facility]  Transportation Needs: No Transportation Needs (06/03/2022)  Utilities: Not At Risk (06/03/2022)  Tobacco Use: Low Risk  (02/29/2024)   See flowsheets for full screening details  Depression Screen Depression Screening Exception Documentation Depression Screening Exception:: Medical reason (dementia)     Goals Addressed   None    Visit info / Clinical Intake: Medicare Wellness Visit Type:: Subsequent Annual Wellness Visit Persons participating in visit:: caregiver (with patient present) Medicare Wellness Visit Mode:: In-person (required for WTM) Information given by:: caregiver; patient Interpreter Needed?: No Pre-visit prep was completed: no AWV questionnaire completed by patient prior to visit?: no Living arrangements:: in nursing facility Typical amount of pain: (!) a lot Does pain affect daily life?: ROLLEN)  yes Are you currently prescribed opioids?: no  Dietary Habits and Nutritional Risks How many meals a day?: 3 Eats fruit and vegetables daily?: yes Most meals are obtained by: having others provide food In the last 2 weeks, have you had any of the following?: none Diabetic:: no  Functional Status Activities of Daily Living (to include ambulation/medication): (!) Needs Assist Feeding: Independent Dressing/Grooming: Needs assistance Bathing: Needs assistance Toileting:  Needs assistance Transfer: Needs assistance Ambulation: Dependent Medication Administration: Dependent Home Management: Dependent Manage your own finances?: (!) no Primary transportation is: facility / other Concerns about vision?: no *vision screening is required for WTM* Concerns about hearing?: no  Fall Screening Falls in the past year?: 1 Number of falls in past year: 1 Was there [name] injury with Fall?: 1 Fall Risk Category Calculator: 3 Patient Fall Risk Level: High Fall Risk  Fall Risk Patient at Risk for Falls Due to: History of fall(s); Impaired balance/gait; Impaired mobility Fall risk Follow up: Falls evaluation completed; Education provided  Home and Transportation Safety: All rugs have non-skid backing?: N/A, no rugs All stairs or steps have railings?: N/A, no stairs Grab bars in the bathtub or shower?: yes Have non-skid surface in bathtub or shower?: yes Good home lighting?: yes Regular seat belt use?: yes  Advance Directives (For Healthcare) Does Patient Have a Medical Advance Directive?: Yes Does patient want to make changes to medical advance directive?: No - Patient declined Type of Advance Directive: Out of facility DNR (pink MOST or yellow form) Out of facility DNR (pink MOST or yellow form) in Chart? (Ambulatory ONLY): Yes - validated most recent copy scanned in chart Pre-existing out of facility DNR order (yellow form or pink MOST form): Pink Most/Yellow Form available - Physician notified to receive inpatient order Would patient like information on creating a medical advance directive?: No - Patient declined  Reviewed/Updated  Reviewed/Updated: Reviewed All (Medical, Surgical, Family, Medications, Allergies, Care Teams, Patient Goals)        Objective:    Today's Vitals   02/29/24 0842  BP: 133/70  Pulse: (!) 58  Resp: 15  Temp: (!) 96.4 F (35.8 C)  SpO2: 91%  Weight: 163 lb 12.8 oz (74.3 kg)  Height: 5' 6 (1.676 m)   Body mass index is  26.44 kg/m.  Current Medications (verified) Outpatient Encounter Medications as of 02/29/2024  Medication Sig   acetaminophen  (TYLENOL ) 500 MG tablet Take 1,000 mg by mouth 2 (two) times daily.   amLODipine  (NORVASC ) 10 MG tablet Take 1 tablet (10 mg total) by mouth daily.   Artificial Tear Solution (SOOTHE XP OP) Apply 1 drop to eye 3 (three) times daily.   atorvastatin  (LIPITOR) 20 MG tablet Take 20 mg by mouth daily.   busPIRone (BUSPAR) 5 MG tablet Take 5 mg by mouth 3 (three) times daily.   Calcium  Carbonate (CALCIUM  600 PO) Take 600 mg by mouth in the morning and at bedtime.   carvedilol  (COREG ) 6.25 MG tablet Take 6.25 mg by mouth 2 (two) times daily with a meal. Contact provider if SBP < 105 or HR <55   diclofenac Sodium (VOLTAREN) 1 % GEL Apply 4 g topically. Apply to both knees every day and evening shift.   folic acid  (FOLVITE ) 1 MG tablet Take 1 mg by mouth daily.   furosemide (LASIX) 20 MG tablet Take 20 mg by mouth as needed. Take for 5lb weight gain in 1 week.   gabapentin  (NEURONTIN ) 100 MG capsule Take 1 capsule (100 mg total)  by mouth at bedtime.   hydrALAZINE  (APRESOLINE ) 10 MG tablet Take 10 mg by mouth every 12 (twelve) hours as needed. For SBP greater than 140.   levothyroxine  (SYNTHROID , LEVOTHROID) 75 MCG tablet Take 75 mcg by mouth at bedtime.   lidocaine  (SALONPAS PAIN RELIEVING) 4 % Place 1 patch onto the skin daily as needed.   losartan  (COZAAR ) 100 MG tablet Take 1 tablet (100 mg total) by mouth daily.   methotrexate  (RHEUMATREX) 2.5 MG tablet Take 15 mg by mouth every Friday.   Multiple Vitamins-Minerals (MULTIVITAMINS THER. W/MINERALS) TABS tablet Take 1 tablet by mouth daily.   pantoprazole  (PROTONIX ) 40 MG tablet Take 1 tablet (40 mg total) by mouth daily.   potassium chloride  (KLOR-CON ) 10 MEQ tablet Take 10 mEq by mouth as needed.   predniSONE  (DELTASONE ) 2.5 MG tablet Take 2.5 mg by mouth daily.   senna (SENOKOT) 8.6 MG TABS tablet Take 1 tablet by mouth  daily.   sertraline  (ZOLOFT ) 100 MG tablet Take 100 mg by mouth at bedtime.   trimethoprim (TRIMPEX) 100 MG tablet Take 100 mg by mouth daily.   No facility-administered encounter medications on file as of 02/29/2024.   Hearing/Vision screen No results found. Immunizations and Health Maintenance Health Maintenance  Topic Date Due   COVID-19 Vaccine (7 - 2025-26 season) 12/05/2023   Medicare Annual Wellness (AWV)  02/28/2025   DTaP/Tdap/Td (5 - Td or Tdap) 02/16/2027   Pneumococcal Vaccine: 50+ Years  Completed   Influenza Vaccine  Completed   Zoster Vaccines- Shingrix  Completed   Meningococcal B Vaccine  Aged Out   Bone Density Scan  Discontinued        Assessment/Plan:  This is a routine wellness examination for Gloria [name].  Patient Care Team: Laurence Locus, DO as PCP - General (Internal Medicine)  I have personally reviewed and noted the following in the patient's chart:   Medical and social history Use of alcohol , tobacco or illicit drugs  Current medications and supplements including opioid prescriptions. Functional ability and status Nutritional status Physical activity Advanced directives List of other physicians Hospitalizations, surgeries, and ER visits in previous 12 months Vitals Screenings to include cognitive, depression, and falls Referrals and appointments  No orders of the defined types were placed in this encounter.  In addition, I have reviewed and discussed with patient certain preventive protocols, quality metrics, and best practice recommendations. A written personalized care plan for preventive services as well as general preventive health recommendations were provided to patient.   Gloria MARLA An, NP   02/29/2024

## 2024-03-15 ENCOUNTER — Non-Acute Institutional Stay (SKILLED_NURSING_FACILITY): Admitting: Nurse Practitioner

## 2024-03-15 ENCOUNTER — Encounter: Payer: Self-pay | Admitting: Nurse Practitioner

## 2024-03-15 DIAGNOSIS — R296 Repeated falls: Secondary | ICD-10-CM | POA: Diagnosis not present

## 2024-03-15 DIAGNOSIS — E782 Mixed hyperlipidemia: Secondary | ICD-10-CM

## 2024-03-15 DIAGNOSIS — F3342 Major depressive disorder, recurrent, in full remission: Secondary | ICD-10-CM

## 2024-03-15 DIAGNOSIS — F03B3 Unspecified dementia, moderate, with mood disturbance: Secondary | ICD-10-CM

## 2024-03-15 DIAGNOSIS — M069 Rheumatoid arthritis, unspecified: Secondary | ICD-10-CM | POA: Diagnosis not present

## 2024-03-15 DIAGNOSIS — D649 Anemia, unspecified: Secondary | ICD-10-CM

## 2024-03-15 DIAGNOSIS — M170 Bilateral primary osteoarthritis of knee: Secondary | ICD-10-CM | POA: Diagnosis not present

## 2024-03-15 DIAGNOSIS — E039 Hypothyroidism, unspecified: Secondary | ICD-10-CM | POA: Diagnosis not present

## 2024-03-15 DIAGNOSIS — I10 Essential (primary) hypertension: Secondary | ICD-10-CM | POA: Diagnosis not present

## 2024-03-15 NOTE — Assessment & Plan Note (Signed)
 Currently on 100 mg daily of zoloft .  Continue current regimen with lifestyle modifications

## 2024-03-15 NOTE — Assessment & Plan Note (Signed)
 High fall risk, multiple interventions in place to reduce falls.

## 2024-03-15 NOTE — Assessment & Plan Note (Signed)
 Stable, no acute changes in cognitive or functional status, continue supportive care.

## 2024-03-15 NOTE — Assessment & Plan Note (Signed)
 Stable disease, no increase in pain. Cont home methotrexate  and low dose prednisone 

## 2024-03-15 NOTE — Assessment & Plan Note (Signed)
 Continues on synthroid  Follow TSH yearly At goal on last labs

## 2024-03-15 NOTE — Assessment & Plan Note (Signed)
 Continues on Lipitor 20 mg daily , LDL at goal  Follow lipids yearly

## 2024-03-15 NOTE — Assessment & Plan Note (Signed)
 Blood pressure well controlled, goal bp <140/90 Continue current medications and dietary modifications follow metabolic panel

## 2024-03-15 NOTE — Assessment & Plan Note (Signed)
 Chronic disease, follow up cbc every 6 months.  Stable on last labs

## 2024-03-15 NOTE — Progress Notes (Signed)
 Location:  Other [place].  Nursing Home Room Number: [facility] [id number] Place of Service:  SNF [phone]) Harlene An, NP  PCP: Laurence Locus, DO  Patient Care Team: Laurence Locus, DO as PCP - General (Internal Medicine)  Extended Emergency Contact Information Primary Emergency Contact: Ivin [facility]  [place] Home Phone: [phone] Relation: Daughter Secondary Emergency Contact: Maple Bridgette Lenis Home Phone: [phone] Mobile Phone: [phone] Relation: Son  Goals of care: Advanced Directive information    02/29/2024    8:51 AM  Advanced Directives  Does Patient Have a Medical Advance Directive? Yes  Type of Advance Directive Out of facility DNR (pink MOST or yellow form)  Does patient want to make changes to medical advance directive? No - Patient declined     Chief Complaint  Patient presents with   Medical Management of Chronic Issues    Medical Management of Chronic Issues.     HPI:  Pt is a 89 y.o. female seen today for medical management of chronic disease. Pt with hx of OA, RA, GERD, Dementia, HTN, multiple falls.  Pt has been doing well in the last month.  No recent falls.  Weights have been stable.  Denies any worsening of pain.  Staff has no concerns at this time.    Past Medical History:  Diagnosis Date   Actinic keratosis    Arthritis    Basal cell carcinoma    Left chin, right nasolabial   Closed pelvic fracture (HCC) 12/16/2020   Closed right hip fracture (HCC) 11/30/2019   Collagen vascular disease    RA   GERD 02/01/2007   Qualifier: Diagnosis of   By: Jimmy MD, Charlie Scarlet        Heartburn    Hip fracture (HCC) 11/30/2019   History of colonic polyps 02/01/2007   Qualifier: Diagnosis of   By: Jimmy MD, Charlie Scarlet     IMO SNOMED Dx Update Oct 2024     History of fracture of right hip 11/30/2019   Formatting of this note might be different from the original.  8/21; s/p THA     Hypertension    Squamous cell  carcinoma of skin 05/06/2021   left hand, treated with [facility]   Past Surgical History:  Procedure Laterality Date   ABDOMINAL HYSTERECTOMY     BREAST CYST EXCISION     HIP ARTHROPLASTY Right 11/30/2019   Procedure: ARTHROPLASTY BIPOLAR HIP (HEMIARTHROPLASTY);  Surgeon: Edie Norleen PARAS, MD;  Location: [facility] ORS;  Service: Orthopedics;  Laterality: Right;   SACROPLASTY N/A 12/17/2020   Procedure: SACROPLASTY;  Surgeon: Kathlynn Sharper, MD;  Location: [facility] ORS;  Service: Orthopedics;  Laterality: N/A;   THORACIC AORTIC ENDOVASCULAR STENT GRAFT N/A 04/11/2019   Procedure: THORACIC AORTIC ENDOVASCULAR STENT GRAFT insertion;  Surgeon: Serene Gaile ORN, MD;  Location: [facility] OR;  Service: Vascular;  Laterality: N/A;   ULTRASOUND GUIDANCE FOR VASCULAR ACCESS Right 04/11/2019   Procedure: Ultrasound Guidance For Vascular Access, right femoral artery;  Surgeon: Serene Gaile ORN, MD;  Location[facility] OR;  Service: Vascular;  Laterality: Right;    Allergies[1]  Outpatient Encounter Medications as of 03/15/2024  Medication Sig   acetaminophen  (TYLENOL ) 500 MG tablet Take 1,000 mg by mouth 2 (two) times daily. Take two tablets by mouth as needed.   amLODipine  (NORVASC ) 10 MG tablet Take 1 tablet (10 mg total) by mouth daily.   Artificial Tear Solution (SOOTHE XP OP) Apply 1 drop to eye 3 (three) times daily.   atorvastatin  (  LIPITOR) 20 MG tablet Take 20 mg by mouth daily.   busPIRone (BUSPAR) 5 MG tablet Take 5 mg by mouth 3 (three) times daily.   Calcium  Carbonate (CALCIUM  600 PO) Take 600 mg by mouth in the morning and at bedtime.   carvedilol  (COREG ) 6.25 MG tablet Take 6.25 mg by mouth 2 (two) times daily with a meal. Contact provider if SBP < 105 or HR <55   diclofenac Sodium (VOLTAREN) 1 % GEL Apply 4 g topically. Apply to both knees every day and evening shift.   folic acid  (FOLVITE ) 1 MG tablet Take 1 mg by mouth daily.   furosemide (LASIX) 20 MG tablet Take 20 mg by mouth as needed. Take for 5lb weight gain in 1  week.   gabapentin  (NEURONTIN ) 100 MG capsule Take 1 capsule (100 mg total) by mouth at bedtime.   hydrALAZINE  (APRESOLINE ) 10 MG tablet Take 10 mg by mouth every 12 (twelve) hours as needed. For SBP greater than 140.   levothyroxine  (SYNTHROID , LEVOTHROID) 75 MCG tablet Take 75 mcg by mouth at bedtime.   lidocaine  (SALONPAS PAIN RELIEVING) 4 % Place 1 patch onto the skin daily as needed.   losartan  (COZAAR ) 100 MG tablet Take 1 tablet (100 mg total) by mouth daily.   methotrexate  (RHEUMATREX) 2.5 MG tablet Take 15 mg by mouth every Friday.   Multiple Vitamins-Minerals (MULTIVITAMINS THER. W/MINERALS) TABS tablet Take 1 tablet by mouth daily.   pantoprazole  (PROTONIX ) 40 MG tablet Take 1 tablet (40 mg total) by mouth daily.   potassium chloride  (KLOR-CON ) 10 MEQ tablet Take 10 mEq by mouth as needed.   predniSONE  (DELTASONE ) 2.5 MG tablet Take 2.5 mg by mouth daily.   senna (SENOKOT) 8.6 MG TABS tablet Take 1 tablet by mouth daily.   sertraline  (ZOLOFT ) 100 MG tablet Take 100 mg by mouth at bedtime.   trimethoprim (TRIMPEX) 100 MG tablet Take 100 mg by mouth daily.   No facility-administered encounter medications on file as of 03/15/2024.    Review of Systems  Unable to perform ROS: Dementia    Immunization History  Administered Date(s) Administered   INFLUENZA, HIGH DOSE SEASONAL PF 01/31/2017, 12/18/2018   Influenza Split 01/21/2014, 12/04/2015   Influenza-Unspecified 01/27/2015, 12/18/2018, 01/01/2020, 01/05/2021, 01/19/2022, 01/26/2023, 01/17/2024   Moderna Covid-19 Vaccine Bivalent Booster 73yrs & up 09/01/2021   Moderna Sars-Covid-2 Vaccination 04/23/2019, 05/21/2019, 02/19/2020, 07/11/2020, 01/29/2021   Pneumococcal Conjugate-13 03/15/2014   Pneumococcal Polysaccharide-23 04/05/2001, 08/13/2016   RSV,unspecified 01/19/2022   Td 07/04/2005   Td (Adult),5 Lf Tetanus Toxid, Preservative Free 02/15/2017   Tdap 07/27/2005, 02/15/2017   Zoster Recombinant(Shingrix) 12/29/2018,  06/10/2019   Zoster, Live 04/05/2006   Pertinent  Health Maintenance Due  Topic Date Due   Influenza Vaccine  Completed   Bone Density Scan  Discontinued      03/28/2022    6:10 AM 01/04/2023   12:42 PM 01/23/2024    4:22 PM 02/15/2024    5:56 PM 02/29/2024    8:54 AM  Fall Risk  Falls in the past year?  1 1 1 1   Was there an injury with Fall?  1  1  1  1    Fall Risk Category Calculator  3 3 3 3   (RETIRED) Patient Fall Risk Level High fall risk       Patient at Risk for Falls Due to   History of fall(s);Impaired balance/gait History of fall(s);Impaired balance/gait History of fall(s);Impaired balance/gait;Impaired mobility  Fall risk Follow up   Falls evaluation  completed Falls evaluation completed Falls evaluation completed;Education provided     Data saved with a previous flowsheet row definition   Functional Status Survey:    Vitals:   03/15/24 1404  BP: 129/75  Pulse: 70  Resp: 16  Temp: (!) 97.5 F (36.4 C)  SpO2: 96%  Weight: 166 lb 12.8 oz (75.7 kg)  Height: 5' 6 (1.676 m)   Body mass index is 26.92 kg/m. Physical Exam Constitutional:      General: She is not in acute distress.    Appearance: She is well-developed. She is not diaphoretic.  HENT:     Head: Normocephalic and atraumatic.     Mouth/Throat:     Pharynx: No oropharyngeal exudate.  Eyes:     Conjunctiva/sclera: Conjunctivae normal.     Pupils: Pupils are equal, round, and reactive to light.  Cardiovascular:     Rate and Rhythm: Normal rate and regular rhythm.     Heart sounds: Normal heart sounds.  Pulmonary:     Effort: Pulmonary effort is normal.     Breath sounds: Normal breath sounds.  Abdominal:     General: Bowel sounds are normal.     Palpations: Abdomen is soft.  Musculoskeletal:     Cervical back: Normal range of motion and neck supple.     Right lower leg: No edema.     Left lower leg: No edema.  Skin:    General: Skin is warm and dry.  Neurological:     Mental Status: She  is alert. She is disoriented.     Motor: Weakness present.     Gait: Gait abnormal.  Psychiatric:        Mood and Affect: Mood normal.     Labs reviewed: Recent Labs    10/17/23 0000 02/14/24 0000  NA 141 140  K 3.9 3.7  CL 108 107  CO2 28* 25*  BUN 19 18  CREATININE 0.8 0.9  CALCIUM  8.5* 8.5*   Recent Labs    10/17/23 0000  AST 18  ALT 14  ALKPHOS 101  ALBUMIN 3.6   Recent Labs    10/17/23 0000 02/14/24 0000 02/29/24 0000  WBC 6.2 9.6 10.3  NEUTROABS 3,354.00  --  6,953.00  HGB 10.3* 8.3* 10.5*  HCT 32* 26* 32*  PLT 179 85* 195   Lab Results  Component Value Date   TSH 4.67 10/17/2023   Lab Results  Component Value Date   HGBA1C 5.6 08/26/2022   Lab Results  Component Value Date   CHOL 140 10/17/2023   HDL 72 (A) 10/17/2023   LDLCALC 51 10/17/2023   TRIG 87 10/17/2023   CHOLHDL 2.5 04/07/2019    Significant Diagnostic Results in last 30 days:  No results found.  Assessment/Plan Rheumatoid arthritis (HCC) Stable disease, no increase in pain. Cont home methotrexate  and low dose prednisone     Recurrent falls High fall risk, multiple interventions in place to reduce falls.   Primary osteoarthritis of both knees Ongoing, has voltaren gel 4g topically QID PRN  Moderate dementia with mood disturbance, unspecified dementia type (HCC) Stable, no acute changes in cognitive or functional status, continue supportive care.   Hypothyroidism Continues on synthroid  Follow TSH yearly At goal on last labs   HLD (hyperlipidemia) Continues on Lipitor 20 mg daily , LDL at goal  Follow lipids yearly   Essential hypertension Blood pressure well controlled, goal bp <140/90 Continue current medications and dietary modifications follow metabolic panel   Depression Currently on 100  mg daily of zoloft .  Continue current regimen with lifestyle modifications   Anemia Chronic disease, follow up cbc every 6 months.  Stable on last labs   Feliciano Wynter K.  Caro BODILY [facility] [phone]       [1]  Allergies Allergen Reactions   Amoxicillin  Other (See Comments)    Intolerant  Daughter and Son report patient tolerated course of Augmentin  in January 2024   Codeine Nausea Only   Ibandronate Sodium  [Ibandronate] Other (See Comments)    Gi upset   Risedronate Other (See Comments)    Gi upset   Sulfa Antibiotics Hives

## 2024-03-15 NOTE — Assessment & Plan Note (Signed)
 Ongoing, has voltaren gel 4g topically QID PRN

## 2024-04-13 ENCOUNTER — Non-Acute Institutional Stay (SKILLED_NURSING_FACILITY): Payer: Self-pay | Admitting: Internal Medicine

## 2024-04-13 ENCOUNTER — Encounter: Payer: Self-pay | Admitting: Internal Medicine

## 2024-04-13 DIAGNOSIS — M170 Bilateral primary osteoarthritis of knee: Secondary | ICD-10-CM

## 2024-04-13 DIAGNOSIS — F331 Major depressive disorder, recurrent, moderate: Secondary | ICD-10-CM | POA: Diagnosis not present

## 2024-04-13 DIAGNOSIS — E782 Mixed hyperlipidemia: Secondary | ICD-10-CM | POA: Diagnosis not present

## 2024-04-13 DIAGNOSIS — R296 Repeated falls: Secondary | ICD-10-CM

## 2024-04-13 DIAGNOSIS — M353 Polymyalgia rheumatica: Secondary | ICD-10-CM

## 2024-04-13 DIAGNOSIS — D849 Immunodeficiency, unspecified: Secondary | ICD-10-CM

## 2024-04-13 DIAGNOSIS — E039 Hypothyroidism, unspecified: Secondary | ICD-10-CM | POA: Diagnosis not present

## 2024-04-13 DIAGNOSIS — F03B3 Unspecified dementia, moderate, with mood disturbance: Secondary | ICD-10-CM

## 2024-04-13 DIAGNOSIS — M069 Rheumatoid arthritis, unspecified: Secondary | ICD-10-CM

## 2024-04-13 DIAGNOSIS — Z7952 Long term (current) use of systemic steroids: Secondary | ICD-10-CM | POA: Diagnosis not present

## 2024-04-13 DIAGNOSIS — N390 Urinary tract infection, site not specified: Secondary | ICD-10-CM | POA: Diagnosis not present

## 2024-04-13 DIAGNOSIS — Z5181 Encounter for therapeutic drug level monitoring: Secondary | ICD-10-CM

## 2024-04-13 DIAGNOSIS — I10 Essential (primary) hypertension: Secondary | ICD-10-CM

## 2024-04-13 NOTE — Progress Notes (Unsigned)
 [facility] Routine Visit Progress Note    Location:  Other [place].  Nursing Home Room Number: [facility] [id number] Place of Service:  SNF (31)   Gloria Locus, DO   Patient Care Team: Gloria Locus, DO as PCP - General (Internal Medicine)   Extended Emergency Contact Information Primary Emergency Contact: Ivin [facility]  [place] Home Phone: [phone] Relation: Daughter Secondary Emergency Contact: Maple Bridgette Lenis Home Phone: [phone] Mobile Phone: [phone] Relation: Son   Goals of care: Advanced Directive information    02/29/2024    8:51 AM  Advanced Directives  Does Patient Have a Medical Advance Directive? Yes  Type of Advance Directive Out of facility DNR (pink MOST or yellow form)  Does patient want to make changes to medical advance directive? No - Patient declined    CODE STATUS: Do Not Resuscitate (DNR)   Chief Complaint  Patient presents with   Medical Management of Chronic Issues    Medical Management of Chronic Issues.      HPI: Pt is a 89 y.o. female seen today for medical management of chronic disease.   [name] is a 89 yo WF with hx of RA, PMR, acquired hypothyroidism, HTN, PVD, moderate Alzheimer's dementia who is seen today for routine medical care at [facility]. Pt is a long term care resident. Her friend Elonda is at the bedside. Her friend Elonda also lives in [place]. Pt and her friend used to live in the same independent living neighborhood.   Pt has been a resident of [facility] in LTC since 06-2022.   Pt enjoys watching birds from her room window where she has multiple bird feeders outside.  Pt does not complains of any specific ailments. She wishes she could go back to independent living but she know she cannot.  Her friend Mara visits her often at [facility].  Pt remains on daily trimethoprim for UTI prevention, daily predisone 5 mg for her hx of RA and PMR. And 15 mg weekly of  Methotrexate  for her RA/PMR.   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  8/21; s/p THA     Hypertension    Intramural hematoma of thoracic aorta (HCC) 04/08/2019   Lung nodule seen on imaging study 08/12/2021   Formatting of this note might be different from the original. 6 x 8 mm right lower lobe nodule on CT 4/23; stable on imaging 11/23, with no further imaging recommended as appears to  be stable compared to films 2021     Migraines 02/05/2020   Formatting of this note might be different from the original.  Occular     Pelvic fracture (HCC) 12/18/2020   Squamous cell carcinoma of skin 05/06/2021   left hand, treated with [facility]   Status post hip hemiarthroplasty 12/03/2019   Thoracic ascending aortic aneurysm 04/07/2019   Weakness 06/03/2022   Past Surgical History:  Procedure Laterality Date   ABDOMINAL HYSTERECTOMY     BREAST CYST EXCISION     HIP ARTHROPLASTY Right 11/30/2019   Procedure: ARTHROPLASTY BIPOLAR HIP (HEMIARTHROPLASTY);  Surgeon: Edie Norleen PARAS, MD;  Location: [facility] ORS;  Service: Orthopedics;  Laterality: Right;   SACROPLASTY N/A 12/17/2020   Procedure: SACROPLASTY;   Surgeon: Kathlynn Sharper, MD;  Location: [facility] ORS;  Service: Orthopedics;  Laterality: N/A;   THORACIC AORTIC ENDOVASCULAR STENT GRAFT N/A 04/11/2019   Procedure: THORACIC AORTIC ENDOVASCULAR STENT GRAFT insertion;  Surgeon: Serene Gaile ORN, MD;  Location: [facility] OR;  Service: Vascular;  Laterality: N/A;   ULTRASOUND GUIDANCE FOR VASCULAR ACCESS Right 04/11/2019   Procedure: Ultrasound Guidance For Vascular Access, right femoral artery;  Surgeon: Serene Gaile ORN, MD;  Location: [facility] OR;  Service: Vascular;  Laterality: Right;     Allergies[1]   Outpatient Encounter Medications as of 04/13/2024  Medication Sig   acetaminophen  (TYLENOL ) 500 MG tablet Take 1,000 mg by mouth 2 (two) times daily. Take two tablets by mouth as needed.   amLODipine  (NORVASC ) 10 MG tablet Take 1 tablet (10 mg total) by mouth daily.   Artificial Tear Solution (SOOTHE XP OP) Apply 1 drop to eye 3 (three) times daily.   atorvastatin  (LIPITOR) 20 MG tablet Take 20 mg by mouth daily.   busPIRone (BUSPAR) 5 MG tablet Take 5 mg by mouth 3 (three) times daily.   Calcium  Carbonate (CALCIUM  600 PO) Take 600 mg by mouth in the morning and at bedtime.   carvedilol  (COREG ) 6.25 MG tablet Take 6.25 mg by mouth 2 (two) times daily with a meal. Contact provider if SBP < 105 or HR <55   diclofenac Sodium (VOLTAREN) 1 % GEL Apply 4 g topically. Apply to both knees every day and evening shift.   folic acid  (FOLVITE ) 1 MG tablet Take 1 mg by mouth daily.   gabapentin  (NEURONTIN ) 100 MG capsule Take 1 capsule (100 mg total) by mouth at bedtime.   hydrALAZINE  (APRESOLINE ) 10 MG tablet Take 10 mg by mouth every 12 (twelve) hours as needed. For SBP greater than 140.   levothyroxine  (SYNTHROID , LEVOTHROID) 75 MCG tablet Take 75 mcg by mouth at bedtime.   lidocaine  (SALONPAS PAIN RELIEVING) 4 % Place 1 patch onto the skin daily as needed.   losartan  (COZAAR ) 100 MG tablet Take 1 tablet (100 mg total) by mouth daily.   methotrexate  (RHEUMATREX) 2.5 MG  tablet Take 15 mg by mouth every Friday.   Multiple Vitamins-Minerals (MULTIVITAMINS THER. W/MINERALS) TABS tablet Take 1 tablet by mouth daily.   pantoprazole  (PROTONIX ) 40 MG tablet Take 1 tablet (40 mg total) by mouth daily.   predniSONE  (DELTASONE ) 2.5 MG tablet Take 2.5 mg by mouth daily.   senna (SENOKOT) 8.6 MG TABS tablet Take 1 tablet by mouth daily.   sertraline  (ZOLOFT ) 100 MG tablet Take 100 mg by mouth at bedtime.   trimethoprim (TRIMPEX) 100 MG tablet Take 100 mg by mouth daily.   furosemide (LASIX) 20 MG tablet Take 20 mg by mouth as needed. Take for 5lb  weight gain in 1 week. (Patient not taking: Reported on 04/13/2024)   potassium chloride  (KLOR-CON ) 10 MEQ tablet Take 10 mEq by mouth as needed. (Patient not taking: Reported on 04/13/2024)   No facility-administered encounter medications on file as of 04/13/2024.     Review of Systems  Constitutional: Negative.   HENT: Negative.    Eyes: Negative.   Respiratory: Negative.    Cardiovascular: Negative.   Gastrointestinal: Negative.   Endocrine: Negative.   Genitourinary: Negative.  Negative for dysuria.  Allergic/Immunologic: Negative.   Neurological: Negative.   Hematological: Negative.   Psychiatric/Behavioral:  Positive for dysphoric mood.        Longs to go back to independent living.  All other systems reviewed and are negative.     Immunization History  Administered Date(s) Administered   INFLUENZA, HIGH DOSE SEASONAL PF 01/31/2017, 12/18/2018   Influenza Split 01/21/2014, 12/04/2015   Influenza-Unspecified 01/27/2015, 12/18/2018, 01/01/2020, 01/05/2021, 01/19/2022, 01/26/2023, 01/17/2024   Moderna Covid-19 Vaccine Bivalent Booster 59yrs & up 09/01/2021   Moderna Sars-Covid-2 Vaccination 04/23/2019, 05/21/2019, 02/19/2020, 07/11/2020, 01/29/2021   Pneumococcal Conjugate-13 03/15/2014   Pneumococcal Polysaccharide-23 04/05/2001, 08/13/2016   RSV,unspecified 01/19/2022   Td 07/04/2005   Td (Adult),5 Lf Tetanus  Toxid, Preservative Free 02/15/2017   Tdap 07/27/2005, 02/15/2017   Unspecified SARS-COV-2 Vaccination 01/27/2024   Zoster Recombinant(Shingrix) 12/29/2018, 06/10/2019   Zoster, Live 04/05/2006   Pertinent  Health Maintenance Due  Topic Date Due   Influenza Vaccine  Completed   Bone Density Scan  Discontinued      03/28/2022    6:10 AM 01/04/2023   12:42 PM 01/23/2024    4:22 PM 02/15/2024    5:56 PM 02/29/2024    8:54 AM  Fall Risk  Falls in the past year?  1 1 1 1   Was there an injury with Fall?  1  1  1  1    Fall Risk Category Calculator  3 3 3 3   (RETIRED) Patient Fall Risk Level High fall risk       Patient at Risk for Falls Due to   History of fall(s);Impaired balance/gait History of fall(s);Impaired balance/gait History of fall(s);Impaired balance/gait;Impaired mobility  Fall risk Follow up   Falls evaluation completed Falls evaluation completed Falls evaluation completed;Education provided     Data saved with a previous flowsheet row definition   Functional Status Survey:     Vitals:   04/13/24 1506  BP: 135/63  Pulse: 61  Resp: 20  Temp: (!) 96.4 F (35.8 C)  SpO2: 98%  Weight: 165 lb 6.4 oz (75 kg)  Height: 5' 6 (1.676 m)   Body mass index is 26.7 kg/m. Physical Exam Vitals and nursing note reviewed.  Constitutional:      Comments: Elderly female. No distress  HENT:     Head: Normocephalic and atraumatic.  Cardiovascular:     Rate and Rhythm: Normal rate and regular rhythm.  Pulmonary:     Effort: Pulmonary effort is normal. No respiratory distress.  Abdominal:     General: Bowel sounds are normal.     Palpations: Abdomen is soft.  Skin:    General: Skin is warm and dry.     Capillary Refill: Capillary refill takes less than 2 seconds.  Neurological:     Comments: Hard of hearing. Likes to explain the people in the pictures on her walls.      Labs reviewed: Recent Labs    10/17/23 0000 02/14/24 0000  NA 141 140  K 3.9 3.7  CL 108 107   CO2 28* 25*  BUN 19 18  CREATININE 0.8 0.9  CALCIUM  8.5* 8.5*   Recent Labs    10/17/23 0000  AST 18  ALT 14  ALKPHOS 101  ALBUMIN 3.6   Recent Labs    10/17/23 0000 02/14/24 0000 02/29/24 0000  WBC 6.2 9.6 10.3  NEUTROABS 3,354.00  --  6,953.00  HGB 10.3* 8.3* 10.5*  HCT 32* 26* 32*  PLT 179 85* 195   Lab Results  Component Value Date   TSH 4.67 10/17/2023   Lab Results  Component Value Date   HGBA1C 5.6 08/26/2022   Lab Results  Component Value Date   CHOL 140 10/17/2023   HDL 72 (A) 10/17/2023   LDLCALC 51 10/17/2023   TRIG 87 10/17/2023   CHOLHDL 2.5 04/07/2019     Significant Diagnostic Results in last 30 days: No results found.   Assessment/Plan Depression, major, recurrent 04/14/2024(1st quarter 2026). With pt ongoing depressive systems due to loss of independence. Pt with ongoing sadness and feelings of wanting to go back to independent living.  Remains on Buspar 5 mg bid for her anxiety, Zoloft  100 mg daily.  Gradual dose reduction not appropriate at this time due to ongoing depression symptoms with anxiety.   Encounter for medication titration 04/14/2024(1st quarter 2026). With pt ongoing depressive systems due to loss of independence. Pt with ongoing sadness and feelings of wanting to go back to independent living.  Remains on Buspar 5 mg bid for her anxiety, Zoloft  100 mg daily.  Gradual dose reduction not appropriate at this time due to ongoing depression symptoms with anxiety.   Recurrent UTI Continue with daily Trimethoprim to suppress recurrent UTIs.  Acquired hypothyroidism Continue synthroid  75 mcg daily.  Rheumatoid arthritis (HCC) Pt remains on prednisone  5 mg daily, methotrexate  15 mg qweekly. She is at risk for adrenal insufficiency due to chronic daily prednisone  use.  Polymyalgia rheumatica (HCC) Pt remains on prednisone  5 mg daily, methotrexate  15 mg qweekly. She is at risk for adrenal insufficiency due to chronic daily  prednisone  use.  Essential hypertension Stable. On losartan  100 mg daily, norvac 10 mg daily, coreg  6.25 mg bid. Prn hydralazine   Immunosuppression - on prednisone  5 mg qd and methotrexate  15 mg qweekly Pt remains on prednisone  5 mg daily, methotrexate  15 mg qweekly. She is at risk for adrenal insufficiency due to chronic daily prednisone  use.  Current chronic use of systemic steroids - at risk for adrenal insufficiency Pt remains on prednisone  5 mg daily, methotrexate  15 mg qweekly. She is at risk for adrenal insufficiency due to chronic daily prednisone  use. She is on these meds for RA and PMR  HLD (hyperlipidemia) Continue lipitor 20 mg daily.  Moderate dementia with mood disturbance, unspecified dementia type (HCC) Stable. Not on any anti-psychotics. No behavioral disturbance. Has depression/anxiety with her dementia. Remains on buspar and zoloft . Her ongoing depressive symptoms with longing to go back to independent makes it inappropriate for Gradual Dose Reduction.  Primary osteoarthritis of both knees Continue with topical Voltaren gel.  Recurrent falls She is at high fall risks. Continue with fall risk prevention.     Camellia Door, DO  [facility] [phone]      [1]  Allergies Allergen Reactions   Amoxicillin  Other (See Comments)    Intolerant  Daughter and Son report patient tolerated course of Augmentin  in January 2024   Codeine Nausea Only   Ibandronate Sodium  [Ibandronate] Other (See  Comments)    Gi upset   Risedronate Other (See Comments)    Gi upset   Sulfa Antibiotics Hives

## 2024-04-14 ENCOUNTER — Encounter: Payer: Self-pay | Admitting: Internal Medicine

## 2024-04-14 DIAGNOSIS — Z5181 Encounter for therapeutic drug level monitoring: Secondary | ICD-10-CM | POA: Insufficient documentation

## 2024-04-14 DIAGNOSIS — Z7952 Long term (current) use of systemic steroids: Secondary | ICD-10-CM | POA: Insufficient documentation

## 2024-04-14 NOTE — Assessment & Plan Note (Addendum)
 04/14/2024(1st quarter 2026). With pt ongoing depressive systems due to loss of independence. Pt with ongoing sadness and feelings of wanting to go back to independent living.  Remains on Buspar 5 mg bid for her anxiety, Zoloft  100 mg daily.  Gradual dose reduction not appropriate at this time due to ongoing depression symptoms with anxiety.

## 2024-04-14 NOTE — Assessment & Plan Note (Signed)
 She is at high fall risks. Continue with fall risk prevention.

## 2024-04-14 NOTE — Assessment & Plan Note (Signed)
Continue with topical Voltaren gel

## 2024-04-14 NOTE — Assessment & Plan Note (Signed)
 Stable. On losartan  100 mg daily, norvac 10 mg daily, coreg  6.25 mg bid. Prn hydralazine 

## 2024-04-14 NOTE — Assessment & Plan Note (Signed)
 Pt remains on prednisone  5 mg daily, methotrexate  15 mg qweekly. She is at risk for adrenal insufficiency due to chronic daily prednisone  use. She is on these meds for RA and PMR

## 2024-04-14 NOTE — Assessment & Plan Note (Signed)
-   Continue synthroid  75 mcg daily

## 2024-04-14 NOTE — Assessment & Plan Note (Signed)
 Pt remains on prednisone  5 mg daily, methotrexate  15 mg qweekly. She is at risk for adrenal insufficiency due to chronic daily prednisone  use.

## 2024-04-14 NOTE — Assessment & Plan Note (Signed)
Continue lipitor 20mg daily  

## 2024-04-14 NOTE — Assessment & Plan Note (Signed)
 04/14/2024(1st quarter 2026). With pt ongoing depressive systems due to loss of independence. Pt with ongoing sadness and feelings of wanting to go back to independent living.  Remains on Buspar 5 mg bid for her anxiety, Zoloft  100 mg daily.  Gradual dose reduction not appropriate at this time due to ongoing depression symptoms with anxiety.

## 2024-04-14 NOTE — Assessment & Plan Note (Signed)
 Continue with daily Trimethoprim to suppress recurrent UTIs.

## 2024-04-14 NOTE — Assessment & Plan Note (Signed)
 Stable. Not on any anti-psychotics. No behavioral disturbance. Has depression/anxiety with her dementia. Remains on buspar and zoloft . Her ongoing depressive symptoms with longing to go back to independent makes it inappropriate for Gradual Dose Reduction.

## 2024-05-06 ENCOUNTER — Other Ambulatory Visit: Payer: Self-pay

## 2024-05-06 ENCOUNTER — Emergency Department

## 2024-05-06 ENCOUNTER — Emergency Department
Admission: EM | Admit: 2024-05-06 | Discharge: 2024-05-06 | Disposition: A | Discharge status: Home / Self Care | Attending: Emergency Medicine | Admitting: Emergency Medicine

## 2024-05-06 DIAGNOSIS — R079 Chest pain, unspecified: Secondary | ICD-10-CM | POA: Insufficient documentation

## 2024-05-06 DIAGNOSIS — N3000 Acute cystitis without hematuria: Secondary | ICD-10-CM | POA: Insufficient documentation

## 2024-05-06 DIAGNOSIS — R519 Headache, unspecified: Secondary | ICD-10-CM | POA: Insufficient documentation

## 2024-05-06 LAB — HEPATIC FUNCTION PANEL
ALT: 22 U/L (ref 0–44)
AST: 29 U/L (ref 15–41)
Albumin: 4.2 g/dL (ref 3.5–5.0)
Alkaline Phosphatase: 98 U/L (ref 38–126)
Bilirubin, Direct: 0.3 mg/dL — ABNORMAL HIGH (ref 0.0–0.2)
Indirect Bilirubin: 0.4 mg/dL (ref 0.3–0.9)
Total Bilirubin: 0.7 mg/dL (ref 0.0–1.2)
Total Protein: 7.2 g/dL (ref 6.5–8.1)

## 2024-05-06 LAB — URINALYSIS, ROUTINE W REFLEX MICROSCOPIC
Bilirubin Urine: NEGATIVE
Glucose, UA: NEGATIVE mg/dL
Hgb urine dipstick: NEGATIVE
Ketones, ur: NEGATIVE mg/dL
Nitrite: POSITIVE — AB
Protein, ur: 30 mg/dL — AB
Specific Gravity, Urine: 1.017 (ref 1.005–1.030)
WBC, UA: >50 WBC/hpf (ref 0–5)
pH: 6 (ref 5.0–8.0)

## 2024-05-06 LAB — BASIC METABOLIC PANEL WITH GFR
Anion gap: 13 (ref 5–15)
BUN: 14 mg/dL (ref 8–23)
CO2: 24 mmol/L (ref 22–32)
Calcium: 8.8 mg/dL — ABNORMAL LOW (ref 8.9–10.3)
Chloride: 104 mmol/L (ref 98–111)
Creatinine, Ser: 0.85 mg/dL (ref 0.44–1.00)
GFR, Estimated: >60 mL/min
Glucose, Bld: 119 mg/dL — ABNORMAL HIGH (ref 70–99)
Potassium: 3.6 mmol/L (ref 3.5–5.1)
Sodium: 141 mmol/L (ref 135–145)

## 2024-05-06 LAB — RESP PANEL BY RT-PCR (RSV, FLU A&B, COVID)  RVPGX2
Influenza A by PCR: NEGATIVE
Influenza B by PCR: NEGATIVE
Resp Syncytial Virus by PCR: NEGATIVE
SARS Coronavirus 2 by RT PCR: NEGATIVE

## 2024-05-06 LAB — LIPASE, BLOOD: Lipase: 23 U/L (ref 11–51)

## 2024-05-06 LAB — CBC
HCT: 35.4 % — ABNORMAL LOW (ref 36.0–46.0)
Hemoglobin: 11.4 g/dL — ABNORMAL LOW (ref 12.0–15.0)
MCH: 31.9 pg (ref 26.0–34.0)
MCHC: 32.2 g/dL (ref 30.0–36.0)
MCV: 99.2 fL (ref 80.0–100.0)
Platelets: 189 10*3/uL (ref 150–400)
RBC: 3.57 MIL/uL — ABNORMAL LOW (ref 3.87–5.11)
RDW: 15.8 % — ABNORMAL HIGH (ref 11.5–15.5)
WBC: 14.1 10*3/uL — ABNORMAL HIGH (ref 4.0–10.5)
nRBC: 0 % (ref 0.0–0.2)

## 2024-05-06 LAB — TROPONIN T, HIGH SENSITIVITY
Troponin T High Sensitivity: 21 ng/L — ABNORMAL HIGH (ref 0–19)
Troponin T High Sensitivity: 24 ng/L — ABNORMAL HIGH (ref 0–19)

## 2024-05-06 LAB — PRO BRAIN NATRIURETIC PEPTIDE: Pro Brain Natriuretic Peptide: 225 pg/mL

## 2024-05-06 MED ORDER — CEPHALEXIN 500 MG PO CAPS
500.0000 mg | ORAL_CAPSULE | Freq: Once | ORAL | Status: AC
  Administered 2024-05-06: 500 mg via ORAL
  Filled 2024-05-06: qty 1

## 2024-05-06 MED ORDER — ACETAMINOPHEN 500 MG PO TABS
1000.0000 mg | ORAL_TABLET | Freq: Once | ORAL | Status: AC
  Administered 2024-05-06: 1000 mg via ORAL
  Filled 2024-05-06: qty 2

## 2024-05-06 MED ORDER — CEFUROXIME AXETIL 500 MG PO TABS: 500.0000 mg | ORAL_TABLET | Freq: Two times a day (BID) | ORAL | 0 refills | Status: AC

## 2024-05-06 NOTE — Discharge Instructions (Addendum)
 Her troponins were slightly elevated but not significantly increasing over time therefore lower suspicion for an acute heart attack however I cannot predict the future she could have some coronary disease that caused her to have some chest pain but given her goals of cares she can follow this up outpatient with her primary care doctor to discuss further we have elected to not admit patient to the hospital for further evaluation of this.  We have started her on antibiotics for UTI   Her x-ray was as below but her BNP was reassuring this could also be discussed further with her primary care doctor if she develops some shortness of breath consider some Lasix but this can increase the risk for urination and falls and at this time family wanted to hold off on Lasix.   IMPRESSION: Low lung volumes with mild pulmonary vascular congestion and a small left pleural effusion.

## 2024-05-06 NOTE — ED Notes (Signed)
 Pt report called and given to staff member Morna. The pt is being transported home via pov by family.

## 2024-05-06 NOTE — ED Triage Notes (Signed)
 BIB [facility] from [facility].  C/O chest pain . Onset [id number].  Received 2 sprays of NTG, patient currently denies C/O chest pain, only c/o headache.  AAOX# skin warm and dry. NAD

## 2024-05-06 NOTE — ED Provider Notes (Signed)
 "  [facility] Provider Note    Event Date/Time   First MD Initiated Contact with Patient 05/06/24 1155     (approximate)   History   Chest Pain   HPI  Gloria [name] is a 89 y.o. female who comes in from [facility] due to concerns for chest pain.  Patient had some chest pain that started around 7 AM and after receiving nitro the chest pain went away.  After giving the nitro she did report a mild headache.  No falls or hitting her head.  Family is at bedside including her POA, daughter who states that if they had been told about this they may not even transported patient here given her dementia goals of care they would not want any aggressive intervention however they are okay with antibiotics.   Physical Exam   Triage Vital Signs: ED Triage Vitals  Encounter Vitals Group     BP 05/06/24 1000 (!) 142/73     Girls Systolic BP Percentile --      Girls Diastolic BP Percentile --      Boys Systolic BP Percentile --      Boys Diastolic BP Percentile --      Pulse Rate 05/06/24 1000 85     Resp 05/06/24 1000 16     Temp 05/06/24 1000 99.5 F (37.5 C)     Temp Source 05/06/24 1000 Oral     SpO2 05/06/24 1000 94 %     Weight 05/06/24 1001 165 lb 5.5 oz (75 kg)     Height --      Head Circumference --      Peak Flow --      Pain Score --      Pain Loc --      Pain Education --      Exclude from Growth Chart --     Most recent vital signs: Vitals:   05/06/24 1000  BP: (!) 142/73  Pulse: 85  Resp: 16  Temp: 99.5 F (37.5 C)  SpO2: 94%     General: Awake, no distress.  CV:  Good peripheral perfusion.  Resp:  Normal effort.  Abd:  No distention.  Soft and nontender Other:  No swelling legs.  No calf tenderness   ED Results / Procedures / Treatments   Labs (all labs ordered are listed, but only abnormal results are displayed) Labs Reviewed  BASIC METABOLIC PANEL WITH GFR - Abnormal; Notable for the following components:      Result  Value   Glucose, Bld 119 (*)    Calcium  8.8 (*)    All other components within normal limits  CBC - Abnormal; Notable for the following components:   WBC 14.1 (*)    RBC 3.57 (*)    Hemoglobin 11.4 (*)    HCT 35.4 (*)    RDW 15.8 (*)    All other components within normal limits  URINALYSIS, ROUTINE W REFLEX MICROSCOPIC - Abnormal; Notable for the following components:   Color, Urine YELLOW (*)    APPearance CLOUDY (*)    Protein, ur 30 (*)    Nitrite POSITIVE (*)    Leukocytes,Ua LARGE (*)    Bacteria, UA FEW (*)    All other components within normal limits  HEPATIC FUNCTION PANEL - Abnormal; Notable for the following components:   Bilirubin, Direct 0.3 (*)    All other components within normal limits  TROPONIN T, HIGH SENSITIVITY - Abnormal; Notable for the  following components:   Troponin T High Sensitivity 21 (*)    All other components within normal limits  TROPONIN T, HIGH SENSITIVITY - Abnormal; Notable for the following components:   Troponin T High Sensitivity 24 (*)    All other components within normal limits  RESP PANEL BY RT-PCR (RSV, FLU A&B, COVID)  [id number]  URINE CULTURE  LIPASE, BLOOD  PRO BRAIN NATRIURETIC PEPTIDE     EKG  My interpretation of EKG:  Normal sinus rate of 88 with any ST elevation or T wave inversions, left bundle branch block.  Reviewed prior EKGs she has had these block in the past.  RADIOLOGY I have reviewed the xray personally and interpreted small effusions and possible pulmonary vascular congestion   PROCEDURES:  Critical Care performed: No  Procedures   MEDICATIONS ORDERED IN ED: Medications  cephALEXin  (KEFLEX ) capsule 500 mg (has no administration in time range)  acetaminophen  (TYLENOL ) tablet 1,000 mg (1,000 mg Oral Given 05/06/24 1258)     IMPRESSION / MDM / ASSESSMENT AND PLAN / ED COURSE  I reviewed the triage vital signs and the nursing notes.   Patient's presentation is most consistent with acute presentation  with potential threat to life or bodily function.   Patient comes in with chest pain.  We discussed with them the possibility of ACS and her goals of care as they would not want further intervention.  She does have a history of aortic stent but if there was an issue with this they would not want surgical interventions would like to decline CT imaging.  We discussed if she was having a heart attack that she would need catheterization but they have also declined this.  However they were interested in getting a repeat troponin just so they can know if she was having a heart attack they can prepare for the next steps but their overall goal is comfort care.  We also discussed her headache which I specked is more likely related to nitro she is otherwise at her neurological baseline and has had no falls and again have declined CT imaging because they would not want to have further intervention.  They did want to get a urine sample given her elevated white count her she had a slightly low low-grade temperature but I rechecked it was 98 but she does have a history of frequent UTIs and she is on immunosuppressant medications and is on medications to suppress UTIs.  Difficult to tell if she is having any symptoms but given the elevated white count they do want to do testing.  Discussed with family the concerns for possible pulmonary vascular congestion father patient is not hypoxic denies shortness of breath has no stool in her legs we discussed pros and cons of Lasix they have opted to decline.  Troponins are slightly elevating but overall reassuring given patient's age and comorbidities and goals of care they can follow this outpatient with their primary care doctor.  But at this time I do not see signs of a severe heart attack given troponins are overall stable.  Her urine does look consistent with UTI.  Will send for culture.  Will start patient on cefuroxime  at that facility.  We discussed return precautions and at  this time they feel comfortable with discharge home.    They understand that I cannot predict future heart attacks and that even though her troponins are stable that there is always a risk for heart attack, death but given their goals of cares  they would prefer patient to return back to facility for comfort.   FINAL CLINICAL IMPRESSION(S) / ED DIAGNOSES   Final diagnoses:  Acute cystitis without hematuria  Nonspecific chest pain     Rx / DC Orders   ED Discharge Orders          Ordered    cefUROXime  (CEFTIN ) 500 MG tablet  2 times daily with meals        05/06/24 1423             Note:  This document was prepared using Dragon voice recognition software and may include unintentional dictation errors.   Ernest Ronal BRAVO, MD 05/06/24 1437  "

## 2024-05-08 ENCOUNTER — Non-Acute Institutional Stay: Payer: Self-pay | Admitting: Nurse Practitioner

## 2024-05-08 ENCOUNTER — Encounter: Payer: Self-pay | Admitting: Nurse Practitioner

## 2024-05-08 DIAGNOSIS — M353 Polymyalgia rheumatica: Secondary | ICD-10-CM

## 2024-05-08 DIAGNOSIS — F331 Major depressive disorder, recurrent, moderate: Secondary | ICD-10-CM

## 2024-05-08 DIAGNOSIS — N3000 Acute cystitis without hematuria: Secondary | ICD-10-CM

## 2024-05-08 DIAGNOSIS — M069 Rheumatoid arthritis, unspecified: Secondary | ICD-10-CM

## 2024-05-08 DIAGNOSIS — R296 Repeated falls: Secondary | ICD-10-CM

## 2024-05-08 DIAGNOSIS — F03B3 Unspecified dementia, moderate, with mood disturbance: Secondary | ICD-10-CM

## 2024-05-08 LAB — URINE CULTURE: Culture: >=100000 — AB
